# Patient Record
Sex: Female | Born: 1945
Health system: Southern US, Community
[De-identification: ages and names within clinical notes are randomized; demographics above are authoritative.]

## PROBLEM LIST (undated history)

## (undated) DIAGNOSIS — M79606 Pain in leg, unspecified: Secondary | ICD-10-CM

## (undated) DIAGNOSIS — M51369 Other intervertebral disc degeneration, lumbar region without mention of lumbar back pain or lower extremity pain: Secondary | ICD-10-CM

## (undated) DIAGNOSIS — H35039 Hypertensive retinopathy, unspecified eye: Secondary | ICD-10-CM

## (undated) DIAGNOSIS — I2510 Atherosclerotic heart disease of native coronary artery without angina pectoris: Secondary | ICD-10-CM

## (undated) DIAGNOSIS — I6529 Occlusion and stenosis of unspecified carotid artery: Secondary | ICD-10-CM

## (undated) DIAGNOSIS — I509 Heart failure, unspecified: Secondary | ICD-10-CM

## (undated) DIAGNOSIS — E785 Hyperlipidemia, unspecified: Secondary | ICD-10-CM

## (undated) DIAGNOSIS — I739 Peripheral vascular disease, unspecified: Secondary | ICD-10-CM

## (undated) DIAGNOSIS — M5136 Other intervertebral disc degeneration, lumbar region: Secondary | ICD-10-CM

## (undated) DIAGNOSIS — M1990 Unspecified osteoarthritis, unspecified site: Secondary | ICD-10-CM

## (undated) DIAGNOSIS — I639 Cerebral infarction, unspecified: Secondary | ICD-10-CM

## (undated) DIAGNOSIS — M2550 Pain in unspecified joint: Secondary | ICD-10-CM

## (undated) DIAGNOSIS — I219 Acute myocardial infarction, unspecified: Secondary | ICD-10-CM

## (undated) DIAGNOSIS — N189 Chronic kidney disease, unspecified: Secondary | ICD-10-CM

## (undated) DIAGNOSIS — H269 Unspecified cataract: Secondary | ICD-10-CM

## (undated) DIAGNOSIS — E11319 Type 2 diabetes mellitus with unspecified diabetic retinopathy without macular edema: Secondary | ICD-10-CM

## (undated) DIAGNOSIS — I10 Essential (primary) hypertension: Secondary | ICD-10-CM

## (undated) HISTORY — PX: TUBAL LIGATION: SHX77

## (undated) HISTORY — PX: PR VEIN BYPASS GRAFT,AORTO-FEM-POP: 35551

## (undated) HISTORY — DX: Heart failure, unspecified: I50.9

## (undated) HISTORY — DX: Atherosclerotic heart disease of native coronary artery without angina pectoris: I25.10

## (undated) HISTORY — DX: Other intervertebral disc degeneration, lumbar region: M51.36

## (undated) HISTORY — PX: APPENDECTOMY: SHX54

## (undated) HISTORY — DX: Unspecified cataract: H26.9

## (undated) HISTORY — DX: Chronic kidney disease, unspecified: N18.9

## (undated) HISTORY — DX: Occlusion and stenosis of unspecified carotid artery: I65.29

## (undated) HISTORY — PX: CHOLECYSTECTOMY: SHX55

## (undated) HISTORY — DX: Acute myocardial infarction, unspecified: I21.9

## (undated) HISTORY — DX: Essential (primary) hypertension: I10

## (undated) HISTORY — DX: Other intervertebral disc degeneration, lumbar region without mention of lumbar back pain or lower extremity pain: M51.369

## (undated) HISTORY — DX: Peripheral vascular disease, unspecified: I73.9

## (undated) HISTORY — DX: Pain in leg, unspecified: M79.606

## (undated) HISTORY — DX: Hypertensive retinopathy, unspecified eye: H35.039

## (undated) HISTORY — PX: CORONARY ARTERY BYPASS GRAFT: SHX141

## (undated) HISTORY — DX: Cerebral infarction, unspecified: I63.9

## (undated) HISTORY — DX: Pain in unspecified joint: M25.50

## (undated) HISTORY — DX: Type 2 diabetes mellitus with unspecified diabetic retinopathy without macular edema: E11.319

## (undated) HISTORY — DX: Unspecified osteoarthritis, unspecified site: M19.90

## (undated) HISTORY — DX: Hyperlipidemia, unspecified: E78.5

---

## 1996-08-29 DIAGNOSIS — I219 Acute myocardial infarction, unspecified: Secondary | ICD-10-CM

## 1996-08-29 HISTORY — DX: Acute myocardial infarction, unspecified: I21.9

## 1999-01-06 ENCOUNTER — Other Ambulatory Visit: Admission: RE | Admit: 1999-01-06 | Discharge: 1999-01-06 | Payer: Self-pay | Admitting: *Deleted

## 2000-04-14 ENCOUNTER — Encounter: Payer: Self-pay | Admitting: Emergency Medicine

## 2000-04-14 ENCOUNTER — Emergency Department (HOSPITAL_COMMUNITY): Admission: EM | Admit: 2000-04-14 | Discharge: 2000-04-14 | Payer: Self-pay | Admitting: Emergency Medicine

## 2000-05-02 ENCOUNTER — Other Ambulatory Visit: Admission: RE | Admit: 2000-05-02 | Discharge: 2000-05-02 | Payer: Self-pay | Admitting: *Deleted

## 2000-05-19 ENCOUNTER — Encounter (INDEPENDENT_AMBULATORY_CARE_PROVIDER_SITE_OTHER): Payer: Self-pay | Admitting: Specialist

## 2000-05-19 ENCOUNTER — Other Ambulatory Visit: Admission: RE | Admit: 2000-05-19 | Discharge: 2000-05-19 | Payer: Self-pay | Admitting: Obstetrics and Gynecology

## 2001-11-22 ENCOUNTER — Other Ambulatory Visit: Admission: RE | Admit: 2001-11-22 | Discharge: 2001-11-22 | Payer: Self-pay | Admitting: Obstetrics and Gynecology

## 2002-12-27 ENCOUNTER — Encounter: Payer: Self-pay | Admitting: Emergency Medicine

## 2002-12-27 ENCOUNTER — Inpatient Hospital Stay (HOSPITAL_COMMUNITY): Admission: EM | Admit: 2002-12-27 | Discharge: 2002-12-30 | Payer: Self-pay | Admitting: Emergency Medicine

## 2004-05-14 ENCOUNTER — Ambulatory Visit (HOSPITAL_COMMUNITY): Admission: RE | Admit: 2004-05-14 | Discharge: 2004-05-14 | Payer: Self-pay | Admitting: Cardiovascular Disease

## 2006-02-09 ENCOUNTER — Ambulatory Visit (HOSPITAL_COMMUNITY): Admission: RE | Admit: 2006-02-09 | Discharge: 2006-02-10 | Payer: Self-pay | Admitting: Vascular Surgery

## 2006-02-14 ENCOUNTER — Encounter: Admission: RE | Admit: 2006-02-14 | Discharge: 2006-02-14 | Payer: Self-pay | Admitting: Obstetrics and Gynecology

## 2006-02-23 ENCOUNTER — Encounter: Admission: RE | Admit: 2006-02-23 | Discharge: 2006-02-23 | Payer: Self-pay | Admitting: Obstetrics and Gynecology

## 2006-02-28 ENCOUNTER — Encounter: Admission: RE | Admit: 2006-02-28 | Discharge: 2006-02-28 | Payer: Self-pay | Admitting: Obstetrics and Gynecology

## 2006-08-29 HISTORY — PX: BREAST EXCISIONAL BIOPSY: SUR124

## 2006-08-29 HISTORY — PX: COLONOSCOPY: SHX174

## 2007-01-05 ENCOUNTER — Ambulatory Visit: Payer: Self-pay | Admitting: Vascular Surgery

## 2007-04-16 ENCOUNTER — Encounter: Admission: RE | Admit: 2007-04-16 | Discharge: 2007-04-16 | Payer: Self-pay | Admitting: Obstetrics and Gynecology

## 2007-04-24 ENCOUNTER — Encounter: Admission: RE | Admit: 2007-04-24 | Discharge: 2007-04-24 | Payer: Self-pay | Admitting: Obstetrics and Gynecology

## 2007-05-02 ENCOUNTER — Ambulatory Visit: Payer: Self-pay | Admitting: Gastroenterology

## 2007-05-04 ENCOUNTER — Ambulatory Visit: Payer: Self-pay | Admitting: Gastroenterology

## 2007-05-04 ENCOUNTER — Encounter: Payer: Self-pay | Admitting: Gastroenterology

## 2007-07-11 ENCOUNTER — Ambulatory Visit: Payer: Self-pay | Admitting: Vascular Surgery

## 2007-11-22 DIAGNOSIS — E7800 Pure hypercholesterolemia, unspecified: Secondary | ICD-10-CM

## 2007-11-22 DIAGNOSIS — E119 Type 2 diabetes mellitus without complications: Secondary | ICD-10-CM

## 2007-11-22 DIAGNOSIS — I2510 Atherosclerotic heart disease of native coronary artery without angina pectoris: Secondary | ICD-10-CM | POA: Insufficient documentation

## 2007-11-22 DIAGNOSIS — I10 Essential (primary) hypertension: Secondary | ICD-10-CM

## 2007-11-22 DIAGNOSIS — I219 Acute myocardial infarction, unspecified: Secondary | ICD-10-CM | POA: Insufficient documentation

## 2007-11-22 DIAGNOSIS — N200 Calculus of kidney: Secondary | ICD-10-CM | POA: Insufficient documentation

## 2008-01-09 ENCOUNTER — Ambulatory Visit: Payer: Self-pay | Admitting: Vascular Surgery

## 2008-03-25 ENCOUNTER — Other Ambulatory Visit: Admission: RE | Admit: 2008-03-25 | Discharge: 2008-03-25 | Payer: Self-pay | Admitting: Family Medicine

## 2008-04-02 ENCOUNTER — Ambulatory Visit: Payer: Self-pay | Admitting: Vascular Surgery

## 2008-04-04 ENCOUNTER — Ambulatory Visit: Payer: Self-pay | Admitting: Vascular Surgery

## 2008-04-04 ENCOUNTER — Ambulatory Visit (HOSPITAL_COMMUNITY): Admission: RE | Admit: 2008-04-04 | Discharge: 2008-04-04 | Payer: Self-pay | Admitting: Vascular Surgery

## 2008-06-05 LAB — HM DEXA SCAN: HM Dexa Scan: NORMAL

## 2008-07-07 ENCOUNTER — Encounter: Admission: RE | Admit: 2008-07-07 | Discharge: 2008-07-07 | Payer: Self-pay | Admitting: Orthopedic Surgery

## 2008-07-23 ENCOUNTER — Encounter: Admission: RE | Admit: 2008-07-23 | Discharge: 2008-07-23 | Payer: Self-pay | Admitting: Orthopedic Surgery

## 2008-10-22 ENCOUNTER — Ambulatory Visit: Payer: Self-pay | Admitting: Vascular Surgery

## 2009-04-30 ENCOUNTER — Ambulatory Visit: Payer: Self-pay | Admitting: Vascular Surgery

## 2009-09-28 ENCOUNTER — Encounter: Admission: RE | Admit: 2009-09-28 | Discharge: 2009-09-28 | Payer: Self-pay | Admitting: Orthopedic Surgery

## 2009-10-12 ENCOUNTER — Encounter: Admission: RE | Admit: 2009-10-12 | Discharge: 2009-10-12 | Payer: Self-pay | Admitting: Orthopedic Surgery

## 2009-10-15 ENCOUNTER — Encounter: Admission: RE | Admit: 2009-10-15 | Discharge: 2009-10-15 | Payer: Self-pay | Admitting: Orthopedic Surgery

## 2009-10-26 ENCOUNTER — Encounter: Admission: RE | Admit: 2009-10-26 | Discharge: 2009-10-26 | Payer: Self-pay | Admitting: Orthopedic Surgery

## 2009-11-12 ENCOUNTER — Ambulatory Visit: Payer: Self-pay | Admitting: Vascular Surgery

## 2010-05-27 ENCOUNTER — Ambulatory Visit: Payer: Self-pay | Admitting: Vascular Surgery

## 2010-06-07 ENCOUNTER — Emergency Department (HOSPITAL_COMMUNITY): Admission: EM | Admit: 2010-06-07 | Discharge: 2010-06-07 | Payer: Self-pay | Admitting: Emergency Medicine

## 2010-06-09 ENCOUNTER — Encounter: Admission: RE | Admit: 2010-06-09 | Discharge: 2010-06-09 | Payer: Self-pay | Admitting: Family Medicine

## 2010-07-06 ENCOUNTER — Encounter: Admission: RE | Admit: 2010-07-06 | Discharge: 2010-07-06 | Payer: Self-pay | Admitting: Family Medicine

## 2010-07-19 ENCOUNTER — Emergency Department (HOSPITAL_COMMUNITY): Admission: EM | Admit: 2010-07-19 | Discharge: 2010-07-19 | Payer: Self-pay

## 2010-09-19 ENCOUNTER — Encounter: Payer: Self-pay | Admitting: Obstetrics and Gynecology

## 2010-11-09 LAB — DIFFERENTIAL
Basophils Relative: 1 % (ref 0–1)
Eosinophils Absolute: 0 10*3/uL (ref 0.0–0.7)
Lymphs Abs: 1.9 10*3/uL (ref 0.7–4.0)
Monocytes Relative: 7 % (ref 3–12)
Neutro Abs: 4.7 10*3/uL (ref 1.7–7.7)
Neutrophils Relative %: 66 % (ref 43–77)

## 2010-11-09 LAB — COMPREHENSIVE METABOLIC PANEL
BUN: 35 mg/dL — ABNORMAL HIGH (ref 6–23)
CO2: 24 mEq/L (ref 19–32)
Calcium: 9.8 mg/dL (ref 8.4–10.5)
GFR calc non Af Amer: 42 mL/min — ABNORMAL LOW (ref >60)
Glucose, Bld: 297 mg/dL — ABNORMAL HIGH (ref 70–99)
Total Protein: 6.7 g/dL (ref 6.0–8.3)

## 2010-11-09 LAB — CBC
HCT: 38.4 % (ref 36.0–46.0)
MCH: 28 pg (ref 26.0–34.0)
MCHC: 33.8 g/dL (ref 30.0–36.0)
MCV: 82.8 fL (ref 78.0–100.0)
Platelets: 249 10*3/uL (ref 150–400)
RDW: 16.3 % — ABNORMAL HIGH (ref 11.5–15.5)

## 2010-11-09 LAB — URINALYSIS, ROUTINE W REFLEX MICROSCOPIC
Bilirubin Urine: NEGATIVE
Glucose, UA: 100 mg/dL — AB
Hgb urine dipstick: NEGATIVE
Ketones, ur: NEGATIVE mg/dL
Protein, ur: NEGATIVE mg/dL
Urobilinogen, UA: 0.2 mg/dL (ref 0.0–1.0)

## 2010-11-09 LAB — GLUCOSE, CAPILLARY: Glucose-Capillary: 307 mg/dL — ABNORMAL HIGH (ref 70–99)

## 2010-11-10 LAB — URINALYSIS, ROUTINE W REFLEX MICROSCOPIC
Bilirubin Urine: NEGATIVE
Hgb urine dipstick: NEGATIVE
Ketones, ur: NEGATIVE mg/dL
Nitrite: NEGATIVE
Protein, ur: NEGATIVE mg/dL
Specific Gravity, Urine: 1.019 (ref 1.005–1.030)
Urobilinogen, UA: 0.2 mg/dL (ref 0.0–1.0)

## 2010-11-10 LAB — POCT I-STAT, CHEM 8
Creatinine, Ser: 1.3 mg/dL — ABNORMAL HIGH (ref 0.4–1.2)
Glucose, Bld: 189 mg/dL — ABNORMAL HIGH (ref 70–99)
HCT: 42 % (ref 36.0–46.0)
Hemoglobin: 14.3 g/dL (ref 12.0–15.0)
Potassium: 3.8 mEq/L (ref 3.5–5.1)
Sodium: 140 mEq/L (ref 135–145)
TCO2: 25 mmol/L (ref 0–100)

## 2010-11-10 LAB — URINE CULTURE: Culture  Setup Time: 201110101454

## 2010-11-10 LAB — CK TOTAL AND CKMB (NOT AT ARMC)
CK, MB: 1.1 ng/mL (ref 0.3–4.0)
Relative Index: INVALID (ref 0.0–2.5)
Total CK: 72 U/L (ref 7–177)

## 2011-01-11 NOTE — Assessment & Plan Note (Signed)
[facility]                         GASTROENTEROLOGY OFFICE NOTE   Brenda Moreno, Brenda Moreno                        MRN:          [id number]  DATE:05/02/2007                            DOB:          07-Jul-1946    PRIMARY CARE PHYSICIAN:  Dr. Doran Clay.   REASON FOR REFERRAL:  Dr. Henderson Cloud asked me to evaluate Brenda Moreno in  consultation regarding colorectal cancer screening, loose stools.   HISTORY OF PRESENT ILLNESS:  Brenda Moreno is a very pleasant 65 year old  diabetic who has been on insulin for 7-10 years.  She does have loose  stools approximately 3 times a week.  She has never tried Imodium for  that.  She sees no bleeding, she has no abdominal cramping.  She never  has constipation.  She has not been screened for colorectal cancer as  far as she can remember.   REVIEW OF SYSTEMS:  Notable for stable weight, is otherwise essentially  normal and is available on her nursing intake sheet.   PAST MEDICAL HISTORY:  1. Hypertension.  2. Coronary artery disease with a heart attack 13 years ago, status      post coronary artery bypass grafting 10 years ago.  3. Diabetes for 20-25 years.  4. Elevated cholesterol.  5. Kidney stones.  6. Tubal ligation.  7. Breast surgery 20 years ago.  8. Appendectomy when she was 65 years old.  9. Cholecystectomy 5 years ago.   CURRENT MEDICINES:  1. Metformin.  2. Metoprolol.  3. Quinapril.  4. Amlodipine.  5. Actos.  6. Crestor.  7. Prempro.  8. Hydrochlorothiazide.  9. Aspirin 81 mg once daily.  10.Humulin.   ALLERGIES:  No known drug allergies.   SOCIAL HISTORY:  Divorced, 1 son, works in Art gallery manager, smokes  cigarettes, does not drink alcohol.   FAMILY HISTORY:  No colon cancer or colon polyps in family.  Breast  cancer runs in her family, uterine cancer runs in her family.   PHYSICAL EXAMINATION:  Height 5 feet 6 inches, 173 pounds, blood  pressure 112/62, pulse 60.  CONSTITUTIONAL:   Generally well appearing.  NEUROLOGIC:  Alert and oriented x3.  EYES:  Extraocular movements intact.  MOUTH:  Oropharynx moist, no lesions.  NECK:  Supple no lymphadenopathy.  CARDIOVASCULAR:  Heart regular rate and rhythm.  LUNGS:  Clear to auscultation bilaterally.  ABDOMEN:  Soft, nontender, nondistended.  Normal bowel sounds.  EXTREMITIES:  No lower extremity edema.  SKIN:  No rashes or lesions on visible extremities.   ASSESSMENT AND PLAN:  A 65 year old woman with long-standing diabetes,  chronic loose stools, routine risk for colorectal cancer.   First, I will arrange for her to have a colonoscopy performed at her  soonest convenience.  She will take half her insulin the night before  and none the morning of the procedure.  She does have chronic loose  stools, most likely this is related to her diabetes.  Obviously, if any  colitis is seen on the colonoscopy, then I could change my  recommendations, but I will probably be recommending that she try 1-2  Imodium daily to see if that helps her loose stools.  I will wait until  after colonoscopy to make that formal recommendation.     Rachael Fee, MD  Electronically Signed    DPJ/MedQ  DD: 05/02/2007  DT: 05/02/2007  Job #: [id number]   cc:   Carrington Clamp, M.D.

## 2011-01-11 NOTE — Assessment & Plan Note (Signed)
OFFICE VISIT   Brenda Moreno, Brenda Moreno  DOB:  04-14-1946                                       04/02/2008  ZOXWR#:[id number]   The patient is a 65 year old female who I previously performed a right  common iliac artery stent in June 2007.  Her claudication symptoms had  essentially resolved at that time.  Of note, she also had a 50% left  external iliac artery stenosis at that time.  Her atherosclerotic risk  factors continue to include hypertension, diabetes and tobacco abuse.  She is currently smoking 3 cigarettes per day.  She states that she fell  in June and broke her right shoulder and had a left ankle sprain.  She  said shortly after that she started developing numbness in her right  calf with ambulation.  She is currently undergoing physical therapy for  her injuries from her fall but states that she has been limited by her  walking distance.   MEDICATIONS:  Include NitroQuick 0.4 mg once a day, metformin 1000 mg 2  daily, metoprolol 100 mg once a day, Actos 45 mg once a day,  hydrochlorothiazide 25 mg once a day, Amlodipine 5 mg once a day,  quinapril 40 mg once a day, Crestor 10 mg once a day, Prempro 0.3 mg  once a day, Lantus insulin 20 units once a day, aspirin 81 mg once a  day, furosemide 20 mg p.r.n.   PHYSICAL EXAM:  Blood pressure is 109/72 in the left arm.  Pulse is 58  and regular.  HEENT is unremarkable.  Neck has 2+ carotid pulses without  bruit.  Chest:  Clear to auscultation.  Cardiac:  Exam is regular rate  and without murmur.  Abdomen is soft, nontender without mass.  Extremities:  She has mild left ankle edema over the lateral malleolus.  She has no palpable femoral, popliteal or pedal pulses bilaterally.   ABIs in May 2009 were 0.75 on the left and 0.91 on the right.   I believe the patient has probably developed recurrent stenosis of her  right iliac stent and has evidence of bilateral aortoiliac occlusive  disease.  I believe the  best option for her is arteriogram to determine  if she may be amendable to re-angioplasty or stenting.  Since she does  not have a femoral pulse, I did discuss with her that she may require a  left brachial puncture.  She does have 2+ brachial and radial pulses  bilaterally.  Slightly increased risk of bleeding was explained her with  a brachial puncture.  Otherwise, risks, benefits, possible  complications, and procedure details of angioplasty, stenting, and  arteriogram were explained to her.  She understands and agrees to  proceed.  Procedure is scheduled for April 04, 2008.   Janetta Hora. Fields, MD  Electronically Signed   CEF/MEDQ  D:  04/03/2008  T:  04/03/2008  Job:  [id number]   cc:   Al Decant. Janey Greaser, MD

## 2011-01-11 NOTE — Assessment & Plan Note (Signed)
OFFICE VISIT   SONA, NATIONS  DOB:  1946/08/04                                       10/22/2008  ZOXWR#:[id number]   The patient returns for followup today.  She underwent right common  iliac artery stenting in June of 2007.  She recently underwent an  arteriogram in August of 2009 which showed that her stent was widely  patent and she had diffuse superficial femoral artery occlusive disease  with 25% stenosis bilaterally.  She also had a 90% stenosis at the  origin of the posterior tibial and peroneal artery in the right leg.  Otherwise she had bilateral three vessel runoff.   She states that her claudication symptoms are overall stable and  essentially nonexistent currently.  She has had some issues with chronic  back pain and had some burning and numbness in her right leg.  She  apparently had steroid injection for this and her numbness symptoms in  the leg have completely resolved.   Unfortunately she has continued to smoke.  She is currently smoking  three cigarettes per day.  I spent a lengthy amount of time again  counseling her against smoking cessation.  She was also given a pamphlet  for the smoking cessation program at [facility].   Otherwise her atherosclerotic risk factors include hypertension and  diabetes.   She states that she started smoking because she was gaining too much  weight.   MEDICATIONS:  1. Amlodipine 5 mg once a day.  2. Crestor 10 mg once a day.  3. Quinapril 40 mg twice a day.  4. Hydrochlorothiazide 25 mg once a day.  5. Actos 45 mg once a day.  6. Metoprolol 100 mg once a day.  7. Metformin 1000 mg two daily.  8. Lantus insulin 25 units once a day.  9. Aspirin 81 mg once a day.   ALLERGIES:  She has no known drug allergies.   PHYSICAL EXAMINATION:  On physical exam blood pressure is 153/79 in the  left arm, pulse is 68 and regular.  HEENT:  Unremarkable.  Neck:  Has 2+  carotid pulses without bruit.   Chest:  Clear to auscultation.  Cardiac:  Regular rate and rhythm.  Abdomen:  Soft, nontender, nondistended with  no masses.  Extremities:  She has 2+ femoral pulses bilaterally and 2+  dorsalis pedis pulses bilaterally.   ABIs today were 0.92 on the right and 0.82 on the left, waveforms were  biphasic.   Overall the patient has stable arterial occlusive disease.  She does not  have debilitating claudication or rest pain at this time.  I believe we  should continue to manage this with risk factor modification and I again  emphasized smoking cessation and control of her diabetes.  She will  follow up with repeat ABIs in six months' time or sooner if she develops  worsening symptoms.   Janetta Hora. Fields, MD  Electronically Signed   CEF/MEDQ  D:  10/23/2008  T:  10/23/2008  Job:  [id number]   cc:   Al Decant. Janey Greaser, MD

## 2011-01-11 NOTE — Op Note (Signed)
Brenda Moreno, Brenda Moreno                 ACCOUNT NO.:  [id number]   MEDICAL RECORD NO.:  [id number]          PATIENT TYPE:  AMB   LOCATION:  SDS                          FACILITY:  [facility]   PHYSICIAN:  Janetta Hora. Fields, MD  DATE OF BIRTH:  1946-08-15   DATE OF PROCEDURE:  04/04/2008  DATE OF DISCHARGE:  04/04/2008                               OPERATIVE REPORT   PROCEDURE:  Aortogram with bilateral lower extremity runoff.   PREOPERATIVE DIAGNOSIS:  Bilateral lower extremity claudication.   POSTOPERATIVE DIAGNOSIS:  Bilateral lower extremity claudication.   ANESTHESIA:  Local.   OPERATIVE DETAILS:  After obtaining informed consent, the patient taken  to the [facility] Lab.  The patient placed in supine position on the angio  table.  Both groins were prepped and draped in usual sterile fashion.  Local anesthesia was infiltrated over the right common femoral artery.  Majestic needle was used to cannulate the right common femoral artery  and a 0.035 Wholey wire advanced into the abdominal aorta under  fluoroscopic guidance.  Next, a 5-French sheath placed over the  guidewire in the right common femoral artery.  A 5-French pigtail  catheter was then placed over the guidewire in the abdominal aorta.  Abdominal aortogram was obtained.  This shows bilateral single patent  renal arteries.  There is diffuse atherosclerotic change and some  calcification of the infrarenal abdominal aorta.  There is approximately  30% narrowing in a tapered fashion of the distal abdominal aorta.  Right  and left common iliac, external iliac and internal iliac arteries are  widely patent.  Previously placed right common iliac artery stent is  widely patent.   Next, bilateral lower extremity runoff was performed.  Left and right  common femoral, profunda femoris, and superficial femoral arteries are  patent.  There are several areas of calcification and mild narrowing of  the left and right superficial femoral arteries.   On the right side,  this is in the mid section.  The left side is in the mid to distal SFA.  Bilateral popliteal arteries are patent.  The anterior tibial artery is  patent bilaterally, all the way to the foot is the dorsalis pedis  artery.  Tibioperoneal trunk on the right side is patent.  However,  there is diffuse narrowing and high-grade stenosis greater than 90% of  the origin of the posterior tibial and peroneal artery on the right leg.  The peroneal and posterior tibial arteries are smaller in caliber than  the anterior tib in the left leg but these are patent.  There is three-  vessel runoff to the foot bilaterally.  This is primarily dominant via  the dorsalis pedis artery.  The posterior tibial artery is small in  caliber on both sides.  The peroneal is more diseased on both sides but  patent.  Next, the pigtail catheter was pulled back over a guidewire.  The 5-French sheath was left in place to be pulled in the holding area.  The patient tolerated the procedure well and there were no  complications.  The patient was taken  to the holding area in stable  condition.   OPERATIVE FINDINGS:  1. No renarrowing of right common iliac artery stent.  2. Mild diffuse approximately 25% stenosis of the left and right      superficial femoral arteries.  3. 90% stenosis of origin of posterior tibial and peroneal artery      right leg.  4. Bilateral three-vessel runoff.      Janetta Hora. Fields, MD  Electronically Signed     CEF/MEDQ  D:  04/04/2008  T:  04/05/2008  Job:  [id number]

## 2011-01-14 NOTE — Cardiovascular Report (Signed)
NAME:  Brenda Moreno, Brenda Moreno NO.:  [id number]   MEDICAL RECORD NO.:  [id number]                   PATIENT TYPE:  INP   LOCATION:  3734                                 FACILITY:  [facility]   PHYSICIAN:  Vesta Mixer, M.D.              DATE OF BIRTH:  [birth year]   DATE OF PROCEDURE:  12/30/2002  DATE OF DISCHARGE:                              CARDIAC CATHETERIZATION   INDICATIONS FOR PROCEDURE:  The patient is a 65 year old female with a  history of coronary artery disease, status post coronary artery bypass  grafting. She was admitted to the hospital on Friday, December 27, 2002, for  episodes of chest pain. Please see the dictated history and physical for  further details. The patient was referred for heart catheterization based on  her symptoms.   PROCEDURE:  Left heart catheterization with coronary angiography. The right  femoral artery was easily cannulated using modified Seldinger technique.   HEMODYNAMICS:  Left ventricular  pressure was 154/29 with an aortic pressure  of 154/65.   ANGIOGRAPHY:  The left main coronary artery is relatively smooth and normal.  The left anterior descending artery has an 80% stenosis proximally. The 1st  diagonal vessel was occluded. There is competitive flow down the distal LAD.   The left circumflex artery is a fairly normal vessel in the proximal  segment. It gives off a large 1st obtuse marginal artery which is  essentially normal. The 2nd obtuse marginal artery is subtotally occluded  with very sluggish antegrade flow.   The right coronary artery is a large vessel. There are mild to moderate  irregularities in the proximal segment. There is a 60% to 70% stenosis in  the distal aspect of the vessel. There is competitive flow from the  saphenous vein graft.   The saphenous vein graft to the 2nd diagonal vessel is a moderate sized  graft. The anastomosis is normal. The native diagonal vessel was fairly  small but is  otherwise normal.   The saphenous vein graft to the 1st diagonal vessel was a relatively normal  graft. It is moderate in size and the anastomosis is normal. The  1st  diagonal vessel itself is fairly small in size.   The saphenous vein graft to the distal right coronary artery and  posterolateral segment artery is a very large graft. The anastomosis looked  normal. There is competitive flow via the native right coronary artery. This  graft is normal. The saphenous vein graft to the 2nd obtuse marginal artery  is occluded proximally. There was no antegrade flow at all. In viewing the  native injections of the 2nd obtuse marginal artery, there is no evidence of  competitive flow either.   The left internal mammary artery is a small to moderate sized vessel. The  flow is somewhat slower than what would be expected, although the artery  itself is normal. The anastomosis  to the LAD is normal. There was  competitive flow visible from the mid vessel.   The left ventriculogram was performed in the 30 RAO position. I reveals  overall normal left ventricular systolic function. The ejection fraction is  between 65% and 70%. There was no mitral regurgitation.   COMPLICATIONS:  None.   CONCLUSIONS:  1. Moderate to severe native coronary artery disease with an occluded     saphenous vein graft to the 2nd obtuse marginal artery. This may explain     some of her symptoms. She has normal left ventricular systolic function     and her other grafts are functioning normally. We will continue with     medical therapy.                                               Vesta Mixer, M.D.    PJN/MEDQ  D:  12/30/2002  T:  12/30/2002  Job:  [id number]

## 2011-01-14 NOTE — Discharge Summary (Signed)
   Brenda Moreno, Brenda Moreno                           ACCOUNT NO.:  [id number]   MEDICAL RECORD NO.:  [id number]                   PATIENT TYPE:  INP   LOCATION:  3734                                 FACILITY:  [facility]   PHYSICIAN:  Vesta Mixer, M.D.              DATE OF BIRTH:  10-02-1945   DATE OF ADMISSION:  12/27/2002  DATE OF DISCHARGE:  12/30/2002                                 DISCHARGE SUMMARY   DISCHARGE DIAGNOSES:  1. Noncardiac chest pain.  2. History of coronary artery disease.  3. Diabetes mellitus.  4. Hyperlipidemia.   DISCHARGE MEDICATIONS:  1. Enteric-coated aspirin 81 mg a day.  2. Nitroglycerin 0.4 mg sublingually as needed.  3. Glucotrol XL 10 mg a day.  4. Cardura 4 mg a day.  5. Toprol XL 50 mg a day.   DISPOSITION:  The patient is to eat a low-fat, low-salt, and low-cholesterol  diet.  She is to see Dr. Elease Hashimoto in 1-2 weeks.   HISTORY:  The patient is a 65 year old female with a long history of  coronary artery disease.  She is status post coronary artery bypass grafting  five years ago.  She was admitted to the hospital with some episodes of  chest pain and chest discomfort.  Please see dictated H&P for further  details.   HOSPITAL COURSE:  Coronary artery disease.  The patient ruled out for a  myocardial infarction with serial CPK's.  She had a heart catheterization on  May 3 which revealed an old occlusion of the saphenous vein graft to the  obtuse marginal artery.  The other grafts in the native vessels appeared to  be fairly well preserved.  She has brisk flow down each of the native  targets.  She had well-preserved left ventricular systolic function.  It is  quite likely that the chest pain that she had earlier this week had nothing  to do with the occlusion of the obtuse marginal graft.  She will be  discharged on the above-noted medications and disposition.  She is at low  risk for having any acute myocardial infarction since the graft is  already  occluded.  I have encouraged her to stop smoking.  I will see her in several  weeks.                                               Vesta Mixer, M.D.   PJN/MEDQ  D:  12/30/2002  T:  12/31/2002  Job:  [id number]   cc:   Al Decant. Janey Greaser, M.D.  [address]  [place]  [place] [address]  Fax: [phone]

## 2011-01-14 NOTE — Cardiovascular Report (Signed)
NAME:  Brenda Moreno, Brenda Moreno NO.:  [id number]   MEDICAL RECORD NO.:  [id number]                   PATIENT TYPE:  OIB   LOCATION:  2899                                 FACILITY:  [facility]   PHYSICIAN:  Vesta Mixer, M.D.              DATE OF BIRTH:  [birth year]   DATE OF PROCEDURE:  05/14/2004  DATE OF DISCHARGE:                              CARDIAC CATHETERIZATION   [name] is a middle-aged female with history of coronary artery disease.  She is status post coronary artery bypass grafting.  She has history of  continued cigarette smoking.  She was seen on Wednesday for episodes of  chest pain.  It was initially recommended that she have a heart  catheterization, but she declined.  We had scheduled a stress Cardiolite  study instead.  When she showed up this morning for her Cardiolite study,  she was severely short of breath and was having some chest pain and we  decided to proceed with heart catheterization.   PROCEDURE PERFORMED:  Left heart catheterization with coronary angiography.   The right femoral artery was easily cannulated using a modified Seldinger  technique.   HEMODYNAMICS:  The left ventricular pressure was 115/7 with an aortic  pressure of 112/50.   CORONARY ANGIOGRAPHY:  1.  Left main coronary artery has minor luminal irregularities.  2.  The left anterior descending artery has moderate to severe stenosis in      the proximal segment.  It is subtotally occluded after giving off the      first diagonal branch.  3.  The left circumflex artery has moderate disease proximally.  The obtuse      marginal artery has mild irregularities.  The circumflex artery is      occluded before giving off the second marginal branch.  4.  The right coronary artery has moderate disease in the mid segment.  The      distal right coronary artery has several severe stenoses between the      posterior lateral branches.  There is competitive flow down the  posterior descending artery and down the third posterior lateral branch.      The saphenous vein graft to the right coronary artery has minor luminal      irregularities in the proximal segment.  Other than that it is a normal      graft with normal insertion to the PDA and the posterior lateral segment      branch.  5.  The saphenous vein graft to the first diagonal is normal.  The diagonal      vessel is fairly small, but is widely patent.  6.  The saphenous vein graft to the second diagonal is normal.  7.  The saphenous vein graft to the obtuse marginal artery #2 is occluded.      This is unchanged from her previous catheterization.  8.  The left internal mammary artery is normal.  The insertion site into the      left anterior descending artery is normal.   LEFT VENTRICULOGRAM:  Left ventriculogram was performed in a 30-RAO  position.  It reveals normal left ventricular systolic function.   CONCLUSIONS:  1.  Severe native coronary artery disease.  She has patent grafts to the      right coronary artery, her diagonal vessels and the left anterior      descending artery.  2.  The saphenous vein graft to the obtuse marginal branch is occluded and      it has been such for the past year and a half.   We will continue with medical therapy.  There were no obvious complications.                                               Vesta Mixer, M.D.    PJN/MEDQ  D:  05/14/2004  T:  05/14/2004  Job:  [id number]   cc:   Dellis Anes. Idell Pickles, M.D.  [address]  [place]  [place] [address]  Fax: [phone]

## 2011-01-14 NOTE — H&P (Signed)
NAME:  Brenda Moreno, Brenda Moreno NO.:  [id number]   MEDICAL RECORD NO.:  [id number]                   PATIENT TYPE:  INP   LOCATION:  3734                                 FACILITY:  [facility]   PHYSICIAN:  Vesta Mixer, M.D.              DATE OF BIRTH:  1945-09-19   DATE OF ADMISSION:  12/27/2002  DATE OF DISCHARGE:                                HISTORY & PHYSICAL   HISTORY OF PRESENT ILLNESS:  Ms. Orest Dikes is a middle-age female with a history  of coronary artery disease, status post coronary artery bypass grafting.  She also has a history of diabetes mellitus and hypercholesterolemia.  She  is admitted with progressive angina.   The patient has a long history of coronary artery disease.  She is status  post PTCA and stenting in 1996 and then status post coronary artery bypass  grafting in 1998.  She has had a history of hypertension and diabetes  mellitus for quite some time.  She has done quite well for the past five  years.  In fact, she has not been in to see me for the past five years.  Approximately one month ago, she started having some mild episodes of chest  pain with exertion.  Over the past several weeks, she has been having more  and more episodes of chest pain with less and less exertion.  Over the past  several days, she has had so much chest pain that she has not been able to  walk around and do her normal activities.  She presented to the emergency  room for further evaluation.   CURRENT MEDICATIONS:  1. Novolin N 10 U q.h.s.  2. Metformin 1000 mg p.o. b.i.d.  3. Toprol XL 150 mg/day.  4. Glucotrol XL 10 mg/day.  5. Lotensin 40 mg/day.  6. Avandia 4 mg p.o. b.i.d.  7. Hydrochlorothiazide 25 mg/day  8. Atacand 32 mg/day.  9. Lipitor 10 mg/day.  10.      Welchol 625 mg 3 times a day.  11.      Norvasc 5 mg/day.  12.      Cardura 4 mg/day.  13.      FemHRT 1 tablet/day.  14.      Aspirin 81 mg/day.   ALLERGIES:  She has no known drug  allergies.   PAST MEDICAL HISTORY:  1. Hypertension.  2. Hyperlipidemia.  3. Coronary artery disease.   SOCIAL HISTORY:  The patient continues to smoke a half pack of cigarettes a  day.  She does not drink to excess.   FAMILY HISTORY:  Positive for coronary artery disease.   REVIEW OF SYSTEMS:  Reviewed and is essentially negative.   PHYSICAL EXAMINATION:  GENERAL:  She is a middle-age black female in no  acute distress.  She is alert and oriented x3, and her mood and affect are  normal.  VITAL SIGNS:  Blood pressure 114/70 with a heart rate of 70.  HEENT:  Reveals 2+ carotids.  She has no bruits.  There is no JVD.  No  thyromegaly.  LUNGS:  Clear to auscultation.  HEART:  Regular rate, S1, S2.  She has no murmurs, gallops or rubs.  ABDOMEN:  Elicited bowel sounds and is nontender.  EXTREMITIES:  She has no clubbing, cyanosis or edema.  NEUROLOGIC:  Nonfocal.   LABORATORY STUDIES:  Her EKG reveals normal sinus rhythm.  She has no ST- or  T-wave changes.  Laboratory data is negative.  The cardiac enzymes are  negative.   IMPRESSION/PLAN:  1. [name] presents with some episodes of angina.  She has a nonacute EKG.  2. We will admit her for stabilization.  3. We will place her on IV heparin.  4. We will anticipate performing heart catheterization on Monday if all of     her other medical problems are stable.  5. We will consult the [facility] hospitalist if she has further noncardiac     issues.                                               Vesta Mixer, M.D.    PJN/MEDQ  D:  12/27/2002  T:  12/27/2002  Job:  [id number]   cc:   Al Decant. Janey Greaser, M.D.  [address]  [place]  [place] [address]  Fax: [phone]

## 2011-01-14 NOTE — Op Note (Signed)
Brenda Moreno, Brenda Moreno                 ACCOUNT NO.:  [id number]   MEDICAL RECORD NO.:  [id number]          PATIENT TYPE:  OIB   LOCATION:  5524                         FACILITY:  [facility]   PHYSICIAN:  Janetta Hora. Fields, MD  DATE OF BIRTH:  1946-06-17   DATE OF PROCEDURE:  02/09/2006  DATE OF DISCHARGE:                                 OPERATIVE REPORT   PROCEDURE:  1.  Aortogram with bilateral lower extremity runoff.  2.  Primary stenting of right common iliac artery stenosis.   PREOPERATIVE DIAGNOSIS:  Claudication right lower extremity.   POSTOPERATIVE DIAGNOSIS:  Claudication right lower extremity.   ANESTHESIA:  Local.   ASSISTANT:  Nurse.   OPERATIVE FINDINGS:  1.  Greater than 75% stenosis right common iliac artery.  2.  Moderate stenosis left external iliac artery origin.  3.  A 3-vessel runoff bilaterally.  4.  An 8 x 24 Genesis balloon expandable stent right common iliac artery.   OPERATIVE DETAILS:  After obtaining informed consent, the patient taken to  the PV lab.  The patient placed in the supine position on the angio table.  Both groins were prepped and draped in the usual sterile fashion.  Local  anesthesia was infiltrated over the left common femoral artery.  A Majestic  needle was used to cannulate the left common femoral artery and a 0.035 J-  tip guide wire threaded into the abdominal aorta under fluoroscopic  guidance.   Next a 5-French sheath was placed over the guidewire in the left common  femoral artery.  A 5-French pigtail catheter was then placed into the  abdominal aorta and abdominal aortogram was obtained.  This shows mild  atherosclerotic changes of the abdominal aorta.  There are single renal  arteries bilaterally; and these are widely patent.  There is some suggestion  of a right common iliac artery stenosis on the initial view.  Pelvic  arteriogram was also performed which shows that the left external iliac  origin has a moderate 50% stenosis.   Left and right internal iliac arteries  are patent.  External iliac arteries are patent distally.  Magnified views  were then performed of the aortic bifurcation.  There is a high-grade  stenosis of the right common iliac artery greater than 75%.  This was best  used on an oblique view.  This also, again, confirms approximately 50%  stenosis of the origin of the left external iliac artery, right at the iliac  bifurcation.   Next, lower extremity runoff views were obtained.  This shows that the left-  and-right common femoral arteries are patent.  Profunda femoris and  superficial femoral arteries are patent at their origin.  The superficial  femoral arteries have mild atherosclerotic changes throughout their course.  The right superficial femoral artery has a 50% stenosis near the adductor  hiatus.  Popliteal arteries are patent bilaterally.  There is 3-vessel  runoff to the foot bilaterally.   Next, the right groin was anesthetized over the right common femoral artery,  and a Majestic needle was used to cannulate the right common femoral  artery.  A 0.035 Wholey wire was then advanced into the abdominal aorta and past the  stenosis in the right common iliac.  A 6-French long bright tip sheath was  then brought up in the operative field, and advanced up to the level of the  iliac bifurcation.  A 5-mm angioplasty balloon was then placed over the  guidewire up to the level of the lesion.  This was then inflated to 10  atmospheres for 1 minute.  There was some minimal waist on the balloon  during this initial angioplasty.  There was still a greater than 75%  stenosis of the common iliac artery origin.   Next the angioplasty balloon was removed over the guidewire.  The patient  was given 5000 units of intravenous heparin prior to inflating the  angioplasty balloon.  After this had been removed.  The dilator was then  placed back in the 6-French sheath; and this was easily advanced up past  the  iliac bifurcation; and up into the common iliac artery, and past the common  iliac artery bifurcation.  Dilator was then, again, removed.  An 8 x 24  Genesis balloon expandable stent was then brought up onto the operative  field.  This was placed in position just above the aortic bifurcation.  This  was then deployed at 8 atmospheres for 1 minute.  The patient had some mild  discomfort in the pelvis during this portion of procedure; and this quickly  resolved with deflation of the limb.  A completion arteriogram was then  obtained and it shows the stent in good position with a widely patent right  common iliac artery; and no residual stenosis.  There was no encroachment  into the left common iliac artery.   The guidewires were then removed and the sheath left in place to be removed  when the ACT was less than 175.  The patient tolerated the procedure well  and there were no complications.  The patient was taken to recovery room in  stable condition.      Janetta Hora. Fields, MD  Electronically Signed     CEF/MEDQ  D:  02/09/2006  T:  02/09/2006  Job:  [id number]

## 2011-05-04 ENCOUNTER — Other Ambulatory Visit: Payer: Self-pay | Admitting: Family Medicine

## 2011-05-04 DIAGNOSIS — Z1231 Encounter for screening mammogram for malignant neoplasm of breast: Secondary | ICD-10-CM

## 2011-05-25 ENCOUNTER — Encounter: Payer: Self-pay | Admitting: Physician Assistant

## 2011-05-27 ENCOUNTER — Ambulatory Visit (INDEPENDENT_AMBULATORY_CARE_PROVIDER_SITE_OTHER): Payer: Medicare Other | Admitting: Thoracic Diseases

## 2011-05-27 ENCOUNTER — Encounter (INDEPENDENT_AMBULATORY_CARE_PROVIDER_SITE_OTHER): Payer: Medicare Other | Admitting: *Deleted

## 2011-05-27 ENCOUNTER — Encounter: Payer: Self-pay | Admitting: Thoracic Diseases

## 2011-05-27 VITALS — BP 119/57 | HR 58 | Resp 16 | Ht 65.0 in | Wt 197.0 lb

## 2011-05-27 DIAGNOSIS — I209 Angina pectoris, unspecified: Secondary | ICD-10-CM

## 2011-05-27 DIAGNOSIS — Z48812 Encounter for surgical aftercare following surgery on the circulatory system: Secondary | ICD-10-CM

## 2011-05-27 DIAGNOSIS — I739 Peripheral vascular disease, unspecified: Secondary | ICD-10-CM

## 2011-05-27 NOTE — Progress Notes (Signed)
[facility] HISTORY AND PHYSICAL -PAD XB:[id number]  pain in lateral calf ; S/P right CIA stent 01/2006 [facility]  History of Present Illness  Brenda Moreno is a 65 y.o. female patient who presents with chief complaint of right lower extremity PAD. Pt. reports 1 month history of achiness in right lateral/post calf especially with walking  Or standing on her feet too long.she states it feels like a muscle spasm. This is relieved with rest and elevation of her legs. Pt. denies rest pain;denies night pain; denies non healing ulcers on any extremity.  Pt has had previous intervention of Right Interventional Vascular Procedure of right CIA stent placed by Dr. Darrick Penna  01/2006  Past Medical History  Diagnosis Date  . Hyperlipidemia   . Hypertension   . CHF (congestive heart failure)   . Arthritis   . Leg pain   . Joint pain   . Myocardial infarction   . Carotid artery occlusion   . Diabetes mellitus   . CAD (coronary artery disease)     Social History History  Substance Use Topics  . Smoking status: Current Everyday Smoker -- 0.2 packs/day    Types: Cigarettes  . Smokeless tobacco: Not on file  . Alcohol Use: No    Family History Family History  Problem Relation Age of Onset  . Heart disease Mother   . Heart disease Father   . Heart disease Sister   . Coronary artery disease Other     ROS: [x]  Positive   [ ]  Negative   [ ]  All sytems reviewed and are negative  General:[ ]  Weight loss,  [ ]  Weight gain, [ ]  Fever, [ ]  chills Neurologic: [ ]  Dizziness, [ ]  Blackouts, [ ]  Seizure [ ]  Stroke, [ ]  "Mini stroke", [ ]  Slurred speech, [ ]  Temporary blindness;  [ ] weakness, [ ]  Hoarseness Cardiac: [x ] Chest pain/pressure intermittently, [ ]  Shortness of breath at rest [x ] Shortness of breath with exertion,  [ ]   Atrial fibrillation or irregular heartbeat Vascular:[x ] Pain in legs with walking, [ ]  Pain in legs at rest ,[ ]  Pain in legs at night,  [ ]    Non-healing ulcer, [ ]  Blood clot in vein/DVT,   Pulmonary: [ ]  Home oxygen, [ ]   Productive cough, [ ]  Coughing up blood,  [ ]  Asthma,  [ ]  Wheezing Musculoskeletal:  [x ] Arthritis, [x ] Low back pain,  [x ] Joint pain Hematologic:[ ]  Easy Bruising, [ ]  Anemia; [ ]  Hepatitis Gastrointestinal: [ ]  Blood in stool,  [ ]  Gastroesophageal Reflux, [ ]  Trouble swallowing Urinary: [ ]  chronic Kidney disease, [ ]  on HD, [ ]  Burning with urination, [ ]  Frequent urination, [ ]  Difficulty urinating;  Skin: [ ]  Rashes, [ ]  Wounds    No Known Allergies  Current outpatient prescriptions:amLODipine (NORVASC) 5 MG tablet, Take 5 mg by mouth daily.  , Disp: , Rfl: ;  aspirin EC 81 MG tablet, Take 81 mg by mouth daily.  , Disp: , Rfl: ;  hydrochlorothiazide (HYDRODIURIL) 25 MG tablet, Take 25 mg by mouth daily.  , Disp: , Rfl: ;  insulin glargine (LANTUS) 100 UNIT/ML injection, Inject 20 Units into the skin at bedtime.  , Disp: , Rfl:  metFORMIN (GLUMETZA) 1000 MG (MOD) 24 hr tablet, Take 1,000 mg by mouth 2 (two) times daily with a meal.  , Disp: , Rfl: ;  metoprolol (TOPROL-XL) 100 MG 24 hr tablet, Take  100 mg by mouth daily.  , Disp: , Rfl: ;  quinapril (ACCUPRIL) 40 MG tablet, Take 40 mg by mouth 2 (two) times daily.  , Disp: , Rfl: ;  rosuvastatin (CRESTOR) 10 MG tablet, Take 10 mg by mouth daily.  , Disp: , Rfl:   Physical Examination  Filed Vitals:   05/27/11 1702  BP: 119/57  Pulse: 58  Resp: 16    There is no height or weight on file to calculate BMI.  General: A&O x 3, WDWN Gait: normal Eyes: PERRLA, Pulmonary: CTAB, Negative  Rales, Negative rhonchi, & Negative wheezing,  Cardiac: regular Rythm ,  Negative Murmurs,  Negative  rubs or gallops                                                                 RIGHT                                       LEFT  CAROTID BRUIT Negative Negative   Gastrointestinal:soft, nontender, BS WNL, no r/g,  Neurologic: A&O X 3; Appropriate Affect ;  SENSATION ;normal; MOTOR FUNCTION: normal 5/5 strength in all tested muscle groups Musculoskeletal:Strength 5/5 all extremities Skin  Normal without ulcers  VASCULAR EXAM: Extremities without ischemic changes  without  Gangrenewithout cellulitis  without open wounds;   LOWER EXTREMITY PULSES           RIGHT                                      LEFT      FEMORAL palpable palpable       POPLITEAL palpable palpable       POSTERIOR TIBIAL palpable palpable       DORSALIS PEDIS      ANTERIOR TIBIAL present 2+ and palpable present 2+ and palpable    Non-Invasive Vascular Imaging: DATE: 05/27/2011 ABI: RIGHT 0.79 ( 0.94 in 04/2010);  LEFT 0.75 (0.89 04/2010)  Previous angiogram: Yes with findings of R CIA stenosis   ASSESSMENT: Brenda Moreno is a 65 y.o. female who presents with: right lower PAD with 1 month Hx of intermittent claudication type symptoms with reduced flow to BLE as indicated by decreasing ABI.  She also has had some cardiac symptoms with few episodes of chest tightness, relieved by rest. Denies any symptoms in past few days  PLAN:  Based on the patient's vascular studies and examination A/possible angiogram to assess right CIA stent and more peripheral stenoses was reccommended. Will discuss with Dr Humphrey Rolls  Pt is also going to call or stop by her cardiologist office today for any w/u that may be needed in regards to her cardiac issues.   appt 3 months with new ABI's if Angio not indicated  Clinic MD: Loraine Maple, MD

## 2011-06-14 ENCOUNTER — Ambulatory Visit
Admission: RE | Admit: 2011-06-14 | Discharge: 2011-06-14 | Disposition: A | Discharge status: Home / Self Care | Payer: Medicare Other | Source: Ambulatory Visit | Attending: Family Medicine | Admitting: Family Medicine

## 2011-06-14 DIAGNOSIS — Z1231 Encounter for screening mammogram for malignant neoplasm of breast: Secondary | ICD-10-CM

## 2011-06-30 ENCOUNTER — Ambulatory Visit (INDEPENDENT_AMBULATORY_CARE_PROVIDER_SITE_OTHER): Payer: Medicare Other | Admitting: Cardiovascular Disease

## 2011-06-30 ENCOUNTER — Encounter: Payer: Self-pay | Admitting: Cardiovascular Disease

## 2011-06-30 VITALS — BP 139/73 | HR 60 | Ht 65.0 in | Wt 200.4 lb

## 2011-06-30 DIAGNOSIS — I2510 Atherosclerotic heart disease of native coronary artery without angina pectoris: Secondary | ICD-10-CM

## 2011-06-30 DIAGNOSIS — R079 Chest pain, unspecified: Secondary | ICD-10-CM

## 2011-06-30 NOTE — Assessment & Plan Note (Signed)
She has a hx of CAD - s/p CABG.  She has had some recent chest pains.  No exertional CP.  She has not had any discomfort in the past several weeks.    She had joined the gym and is planning on restarting her exercise program.  Will schedule her for a 2 day stress myoview.   Return visit in 2-3 months.

## 2011-06-30 NOTE — Progress Notes (Signed)
Brenda Moreno Date of Birth  [birth year] [facility] [address]    [address] [place], [place]  [address] [phone]  Fax  [phone]  History of Present Illness:  This is hASS a 65 year old female with a history of coronary artery disease but she status post coronary artery bypass grafting in 1998. I last saw her approximately 10 years ago. She presents today for further evaluation of mild chest pain.  The chest pains occurred with rest and lasted 5 minutes.  She did not take NTG.  She has not been execising.    Current Outpatient Prescriptions on File Prior to Visit  Medication Sig Dispense Refill  . amLODipine (NORVASC) 5 MG tablet Take 5 mg by mouth daily.        Marland Kitchen aspirin EC 81 MG tablet Take 81 mg by mouth daily.        . hydrochlorothiazide (HYDRODIURIL) 25 MG tablet Take 25 mg by mouth daily.        . insulin glargine (LANTUS) 100 UNIT/ML injection Inject 25 Units into the skin at bedtime.       . metFORMIN (GLUMETZA) 1000 MG (MOD) 24 hr tablet Take 1,000 mg by mouth daily with breakfast.       . metoprolol (TOPROL-XL) 100 MG 24 hr tablet Take 100 mg by mouth daily.        . quinapril (ACCUPRIL) 40 MG tablet Take 40 mg by mouth daily.         No Known Allergies  Past Medical History  Diagnosis Date  . Hyperlipidemia   . Hypertension   . CHF (congestive heart failure)   . Arthritis   . Leg pain   . Joint pain   . Myocardial infarction   . Carotid artery occlusion   . Diabetes mellitus   . CAD (coronary artery disease)     S/P cabg    Past Surgical History  Procedure Date  . Coronary artery bypass graft     1998  . Appendectomy   . Tubal ligation   . Cholecystectomy     Gall bladder  . Pr vein bypass graft,aorto-fem-pop     History  Smoking status  . Current Everyday Smoker -- 0.2 packs/day  . Types: Cigarettes  Smokeless tobacco  . Not on file    History  Alcohol Use No    Family History  Problem Relation Age of Onset  . Heart  disease Mother   . Heart disease Father   . Heart disease Sister   . Coronary artery disease Other     Reviw of Systems:  Reviewed in the HPI.  All other systems are negative.  Physical Exam: BP 139/73  Pulse 60  Ht 5\' 5"  (1.651 m)  Wt 200 lb 6.4 oz (90.901 kg)  BMI 33.35 kg/m2 The patient is alert and oriented x 3.  The mood and affect are normal.   Skin: warm and dry.  Color is normal.    HEENT:   Normocephalic, atraumatic. She has normal carotid speech is no JVD.  Lungs: Lungs are clear   Heart: Heart regular S1-S2    Abdomen: Abdominal exam is benign. She has good bowel sounds. There is no hepatosplenomegaly.  Extremities:  Shows no clubbing cyanosis or edema  Neuro:  Her neuro exam is nonfocal. Her gait is normal the    ECG: Normal sinus rhythm. Has no ST or T wave changes   Assessment / Plan:

## 2011-06-30 NOTE — Patient Instructions (Addendum)
Your physician wants you to follow-up in: 2-3 months You will receive a reminder letter in the mail two months in advance. If you don't receive a letter, please call our office to schedule the follow-up appointment.   Your physician has requested that you have en exercise stress myoview. For further information please visit [email or web address] Please follow instruction sheet, as given.

## 2011-07-12 ENCOUNTER — Ambulatory Visit (HOSPITAL_COMMUNITY): Payer: Medicare Other | Attending: Cardiology | Admitting: Radiology

## 2011-07-12 DIAGNOSIS — I2510 Atherosclerotic heart disease of native coronary artery without angina pectoris: Secondary | ICD-10-CM

## 2011-07-12 DIAGNOSIS — R0609 Other forms of dyspnea: Secondary | ICD-10-CM | POA: Insufficient documentation

## 2011-07-12 DIAGNOSIS — R079 Chest pain, unspecified: Secondary | ICD-10-CM

## 2011-07-12 DIAGNOSIS — F17200 Nicotine dependence, unspecified, uncomplicated: Secondary | ICD-10-CM | POA: Insufficient documentation

## 2011-07-12 DIAGNOSIS — I219 Acute myocardial infarction, unspecified: Secondary | ICD-10-CM

## 2011-07-12 DIAGNOSIS — R5381 Other malaise: Secondary | ICD-10-CM | POA: Insufficient documentation

## 2011-07-12 DIAGNOSIS — E7800 Pure hypercholesterolemia, unspecified: Secondary | ICD-10-CM

## 2011-07-12 DIAGNOSIS — Z794 Long term (current) use of insulin: Secondary | ICD-10-CM | POA: Insufficient documentation

## 2011-07-12 DIAGNOSIS — R0789 Other chest pain: Secondary | ICD-10-CM | POA: Insufficient documentation

## 2011-07-12 DIAGNOSIS — I10 Essential (primary) hypertension: Secondary | ICD-10-CM | POA: Insufficient documentation

## 2011-07-12 DIAGNOSIS — Z951 Presence of aortocoronary bypass graft: Secondary | ICD-10-CM | POA: Insufficient documentation

## 2011-07-12 DIAGNOSIS — Z8249 Family history of ischemic heart disease and other diseases of the circulatory system: Secondary | ICD-10-CM | POA: Insufficient documentation

## 2011-07-12 DIAGNOSIS — I252 Old myocardial infarction: Secondary | ICD-10-CM | POA: Insufficient documentation

## 2011-07-12 DIAGNOSIS — I739 Peripheral vascular disease, unspecified: Secondary | ICD-10-CM | POA: Insufficient documentation

## 2011-07-12 DIAGNOSIS — E119 Type 2 diabetes mellitus without complications: Secondary | ICD-10-CM | POA: Insufficient documentation

## 2011-07-12 DIAGNOSIS — R0989 Other specified symptoms and signs involving the circulatory and respiratory systems: Secondary | ICD-10-CM | POA: Insufficient documentation

## 2011-07-12 DIAGNOSIS — E785 Hyperlipidemia, unspecified: Secondary | ICD-10-CM | POA: Insufficient documentation

## 2011-07-12 MED ORDER — TECHNETIUM TC 99M TETROFOSMIN IV KIT
33.0000 | PACK | Freq: Once | INTRAVENOUS | Status: AC | PRN
  Administered 2011-07-12: 33 via INTRAVENOUS

## 2011-07-12 NOTE — Progress Notes (Signed)
[facility] SITE 3 NUCLEAR MED [address] [place] [address] [phone]  Cardiology Nuclear Med Study  Brenda Moreno is a 65 y.o. female [id number] 10/01/1945   Nuclear Med Background Indication for Stress Test:  Evaluation for Ischemia and Graft Patency History: 98 CABG x6,05 Heart Catheterization: EF=65-70% with occluded obtuse graft and treat medically, 85 Myocardial Infarction and Approximately 10 yrs ago Myocardial Perfusion Study:normal per patient, no record available Cardiac Risk Factors: Family History - CAD, Hypertension, IDDM Type 2, Lipids, 09 PVD;Iliac artery Stent and Smoker  Symptoms: Chest Pain,Pressure and Tightness at Rest and with exertion.(Last episode 2 months ago), DOE and Fatigue   Nuclear Pre-Procedure Caffeine/Decaff Intake:  None NPO After: 7:00pm   Lungs:  clear IV 0.9% NS with Angio Cath:  22g  IV Site: L Antecubital  IV Started by:  Stanton Kidney, EMT-P  Chest Size (in):  44  Cup Size: DD  Height: 5\' 5"  (1.651 m)  Weight:  200 lb (90.719 kg)  BMI:  Body mass index is 33.28 kg/(m^2). Tech Comments:  CBG=95 @ 8:00am, per patient. Metoprolol held > 48 hours, per patient.    Nuclear Med Study 1 or 2 day study: 1 day  Stress Test Type:  Stress  Reading MD: Olga Millers, MD  Order Authorizing Provider:  Jannette Spanner  Resting Radionuclide: Technetium 32m Tetrofosmin  Resting Radionuclide Dose: 33.0 mCi on 07/13/11   Stress Radionuclide:  Technetium 80m Tetrofosmin  Stress Radionuclide Dose: 33.0 mCi on 07/12/11           Stress Protocol Rest HR: 68 Stress HR: 162  Rest BP: 135/69 Stress BP: 212/85  Exercise Time (min): 5:23 METS: 6.10   Predicted Max HR: 157 bpm % Max HR: 103.18 bpm Rate Pressure Product: 21308   Dose of Adenosine (mg):  n/a Dose of Lexiscan: n/a mg  Dose of Atropine (mg): n/a Dose of Dobutamine: n/a mcg/kg/min (at max HR)  Stress Test Technologist: Cathlyn Parsons, RN  Nuclear Technologist:   Domenic Polite, CNMT     Rest Procedure:  Myocardial perfusion imaging was performed at rest 45 minutes following the intravenous administration of Technetium 71m Tetrofosmin. Rest ECG: NSR with T wave abnormality  Stress Procedure:  The patient exercised for 5:23. The patient stopped due to fatigue,hypertension and severe SOB and denied any chest pain.  There were no significant ST-T wave changes. Patient had occasional PAC's in recovery. Technetium 31m Tetrofosmin was injected at peak exercise and myocardial perfusion imaging was performed after a brief delay. Stress ECG: No significant ST segment change suggestive of ischemia.  QPS Raw Data Images:  Acquisition technically good; normal left ventricular size. Stress Images:  Normal homogeneous uptake in all areas of the myocardium. Rest Images:  Normal homogeneous uptake in all areas of the myocardium. Subtraction (SDS):  No evidence of ischemia. Transient Ischemic Dilatation (Normal <1.22):  1.05 Lung/Heart Ratio (Normal <0.45):  0.31  Quantitative Gated Spect Images QGS EDV:  62 ml QGS ESV:  21 ml QGS cine images:  NL LV Function; NL Wall Motion QGS EF: 67%  Impression Exercise Capacity:  Poor exercise capacity. BP Response:  Hypertensive blood pressure response. Clinical Symptoms:  No chest pain. ECG Impression:  No significant ST segment change suggestive of ischemia. Comparison with Prior Nuclear Study: No images to compare  Overall Impression:  Normal stress nuclear study.  Olga Millers

## 2011-07-13 ENCOUNTER — Ambulatory Visit (HOSPITAL_COMMUNITY): Payer: Medicare Other | Attending: Cardiology | Admitting: Radiology

## 2011-07-13 DIAGNOSIS — R0989 Other specified symptoms and signs involving the circulatory and respiratory systems: Secondary | ICD-10-CM

## 2011-07-13 MED ORDER — TECHNETIUM TC 99M TETROFOSMIN IV KIT
33.0000 | PACK | Freq: Once | INTRAVENOUS | Status: AC | PRN
  Administered 2011-07-13: 33 via INTRAVENOUS

## 2011-09-02 ENCOUNTER — Other Ambulatory Visit: Payer: Medicare Other

## 2011-09-02 ENCOUNTER — Ambulatory Visit: Payer: Medicare Other | Admitting: Vascular Surgery

## 2011-09-07 ENCOUNTER — Ambulatory Visit (INDEPENDENT_AMBULATORY_CARE_PROVIDER_SITE_OTHER): Payer: Medicare Other | Admitting: Cardiovascular Disease

## 2011-09-07 ENCOUNTER — Encounter: Payer: Self-pay | Admitting: Cardiovascular Disease

## 2011-09-07 DIAGNOSIS — I2510 Atherosclerotic heart disease of native coronary artery without angina pectoris: Secondary | ICD-10-CM

## 2011-09-07 DIAGNOSIS — I10 Essential (primary) hypertension: Secondary | ICD-10-CM

## 2011-09-07 NOTE — Assessment & Plan Note (Signed)
It is doing quite well. She's not had any episodes of chest pain or shortness of breath. He's been exercising a lot today without any difficulties. Her stress Myoview study was normal.  I've asked to continue with a good diet and exercise program. I've asked her to try to cut back her smoking and eventually stop smoking. I'll see her in 3 months primarily to check her blood pressure.

## 2011-09-07 NOTE — Assessment & Plan Note (Signed)
Her current medications include amlodipine 5 mg, Accupril 40 mg a day, HCTZ 25 mg a day, and Toprol-XL 100 mg a day. She still eats some extra salt. We'll have her cut back her salt intake and continued exercise. I've encouraged her to lose weight. I'll see her again in 3 months.

## 2011-09-07 NOTE — Progress Notes (Signed)
Pincus Badder Date of Birth  08/23/1946 [facility] [address]    [address] [place], [place]  [address] [phone]  Fax  [phone]  History of Present Illness:  Brenda Moreno a 66 year old female with a history of coronary artery disease but she status post coronary artery bypass grafting in 1998. I last saw her approximately 10 years ago. She presents today for further evaluation of mild chest pain.  The chest pains occurred with rest and lasted 5 minutes.  She did not take NTG.  She has been exercising at the [facility]  on a regular basis.  She had a stress test after I saw her in November which was negative for ischemia.  She still eats extra salt.     Current Outpatient Prescriptions on File Prior to Visit  Medication Sig Dispense Refill  . amLODipine (NORVASC) 5 MG tablet Take 5 mg by mouth daily.        Marland Kitchen aspirin EC 81 MG tablet Take 81 mg by mouth daily.        Marland Kitchen CINNAMON PO Take 1,000 mg by mouth 2 (two) times daily.        . Coenzyme Q10 (CO Q 10 PO) Take 100 mg by mouth daily.        . hydrochlorothiazide (HYDRODIURIL) 25 MG tablet Take 25 mg by mouth daily.        . insulin aspart (NOVOLOG) 100 UNIT/ML injection Inject 10 Units into the skin 3 (three) times daily before meals.        . insulin glargine (LANTUS) 100 UNIT/ML injection Inject 25 Units into the skin at bedtime.       . metFORMIN (GLUMETZA) 1000 MG (MOD) 24 hr tablet Take 1,000 mg by mouth daily with breakfast.       . metoprolol (TOPROL-XL) 100 MG 24 hr tablet Take 100 mg by mouth daily.        . pravastatin ([name]) 20 MG tablet Take 20 mg by mouth daily.        . quinapril (ACCUPRIL) 40 MG tablet Take 40 mg by mouth daily.         No Known Allergies  Past Medical History  Diagnosis Date  . Hyperlipidemia   . Hypertension   . CHF (congestive heart failure)   . Arthritis   . Leg pain   . Joint pain   . Myocardial infarction   . Carotid artery occlusion   . Diabetes mellitus   . CAD (coronary  artery disease)     S/P cabg    Past Surgical History  Procedure Date  . Coronary artery bypass graft     1998  . Appendectomy   . Tubal ligation   . Cholecystectomy     Gall bladder  . Pr vein bypass graft,aorto-fem-pop     History  Smoking status  . Current Everyday Smoker -- 0.2 packs/day  . Types: Cigarettes  Smokeless tobacco  . Not on file    History  Alcohol Use No    Family History  Problem Relation Age of Onset  . Heart disease Mother   . Heart disease Father   . Heart disease Sister   . Coronary artery disease Other     Reviw of Systems:  Reviewed in the HPI.  All other systems are negative.  Physical Exam: BP 161/81  Pulse 61  Ht 5\' 5"  (1.651 m)  Wt 202 lb (91.627 kg)  BMI 33.61 kg/m2 The patient is alert and  oriented x 3.  The mood and affect are normal.   Skin: warm and dry.  Color is normal.    HEENT:   Normocephalic, atraumatic. She has normal carotid speech is no JVD.  Lungs: Lungs are clear   Heart: Heart regular S1-S2    Abdomen: Abdominal exam is benign. She has good bowel sounds. There is no hepatosplenomegaly.  Extremities:  Shows no clubbing cyanosis or edema  Neuro:  Her neuro exam is nonfocal. Her gait is normal the    ECG: Normal sinus rhythm. Has no ST or T wave changes   Assessment / Plan:

## 2011-09-07 NOTE — Patient Instructions (Signed)
Your physician wants you to follow-up in: 3 months app set.  Your physician recommends that you continue on your current medications as directed. Please refer to the Current Medication list given to you today.

## 2011-09-21 ENCOUNTER — Encounter: Payer: Self-pay | Admitting: Vascular Surgery

## 2011-09-22 ENCOUNTER — Encounter: Payer: Self-pay | Admitting: Vascular Surgery

## 2011-09-22 ENCOUNTER — Ambulatory Visit (INDEPENDENT_AMBULATORY_CARE_PROVIDER_SITE_OTHER): Payer: Medicare Other | Admitting: Vascular Surgery

## 2011-09-22 ENCOUNTER — Other Ambulatory Visit (INDEPENDENT_AMBULATORY_CARE_PROVIDER_SITE_OTHER): Payer: Medicare Other | Admitting: *Deleted

## 2011-09-22 VITALS — BP 131/70 | HR 57 | Resp 18 | Ht 65.0 in | Wt 198.0 lb

## 2011-09-22 DIAGNOSIS — I739 Peripheral vascular disease, unspecified: Secondary | ICD-10-CM

## 2011-09-22 DIAGNOSIS — I70219 Atherosclerosis of native arteries of extremities with intermittent claudication, unspecified extremity: Secondary | ICD-10-CM | POA: Insufficient documentation

## 2011-09-22 NOTE — Progress Notes (Signed)
[facility] [place] HISTORY AND PHYSICAL   History of Present Illness:  Patient is a 66 y.o. year old female who presents for follow-up evaluation for PAD.  She is on Aspirin for antiplatelet therapy. Her atherosclerotic risk factors remain diabetes, elevated cholesterol, hypertension, smoking (quit 2 weeks ago), and coronary artery disease.  These are all currently stable and followed by the primary care physician.  The patient currently has claudication symptoms that occur in the right/left leg at 1/2 mile distance on a treadmill.  The patient denies rest pain or ulcers on the feet. She underwent right common iliac artery stenting in June of 2007  Past Medical History  Diagnosis Date  . Hyperlipidemia   . Hypertension   . CHF (congestive heart failure)   . Arthritis   . Leg pain   . Joint pain   . Myocardial infarction   . Carotid artery occlusion   . Diabetes mellitus   . CAD (coronary artery disease)     S/P cabg  . Peripheral vascular disease     Past Surgical History  Procedure Date  . Coronary artery bypass graft     1998  . Appendectomy   . Tubal ligation   . Cholecystectomy     Gall bladder  . Pr vein bypass graft,aorto-fem-pop     Review of Systems:  Neurologic: sensation in the feet is intact Cardiac:denies shortness of breath or chest pain Pulmonary: denies cough or wheeze  Social History History  Substance Use Topics  . Smoking status: Former Smoker -- 0.2 packs/day    Types: Cigarettes    Quit date: 09/13/2011  . Smokeless tobacco: Not on file  . Alcohol Use: No    Allergies  No Known Allergies   Current Outpatient Prescriptions  Medication Sig Dispense Refill  . amLODipine (NORVASC) 5 MG tablet Take 5 mg by mouth daily.        Marland Kitchen aspirin EC 81 MG tablet Take 81 mg by mouth daily.        Marland Kitchen CINNAMON PO Take 1,000 mg by mouth 2 (two) times daily.        . Coenzyme Q10 (CO Q 10 PO) Take 100 mg by mouth daily.        .  hydrochlorothiazide (HYDRODIURIL) 25 MG tablet Take 25 mg by mouth daily.        . insulin aspart (NOVOLOG) 100 UNIT/ML injection Inject 10 Units into the skin 3 (three) times daily before meals.        . insulin glargine (LANTUS) 100 UNIT/ML injection Inject 25 Units into the skin at bedtime.       . metFORMIN (GLUMETZA) 1000 MG (MOD) 24 hr tablet Take 1,000 mg by mouth 2 (two) times daily with a meal.       . metoprolol (TOPROL-XL) 100 MG 24 hr tablet Take 100 mg by mouth daily.        . pravastatin ([name]) 20 MG tablet Take 20 mg by mouth daily.        . quinapril (ACCUPRIL) 40 MG tablet Take 40 mg by mouth 2 (two) times daily.         Physical Examination  Filed Vitals:   09/22/11 1459  BP: 131/70  Pulse: 57  Resp: 18  Height: 5\' 5"  (1.651 m)  Weight: 198 lb (89.812 kg)  SpO2: 100%    Body mass index is 32.95 kg/(m^2).  General:  Alert and oriented, no acute distress HEENT: Normal Neck:  No bruit or JVD Pulmonary: Clear to auscultation bilaterally Cardiac: Regular Rate and Rhythm without murmur Neurologic: Upper and lower extremity motor 5/5 and symmetric Extremities:  2+ femoral  Absent popliteal and pedal pulses Skin: no ulcer or rash  DATA: She had bilateral ABIs performed today which shows an ABI on the right of 0.8 to left 0.86 she is triphasic waveforms bilaterally   ASSESSMENT: Stable peripheral arterial disease with minimal claudication symptoms   PLAN: She will followup in one year with repeat lab and be placed in our surveillance protocol  Fabienne Bruns, MD [facility] Office: [phone] Pager: [phone]

## 2011-09-26 NOTE — Progress Notes (Signed)
Addended by: Sharee Pimple on: 09/26/2011 12:46 PM   Modules accepted: Orders

## 2011-11-24 ENCOUNTER — Ambulatory Visit: Payer: Medicare Other | Admitting: Cardiovascular Disease

## 2012-01-16 ENCOUNTER — Other Ambulatory Visit: Payer: Self-pay | Admitting: Family Medicine

## 2012-01-16 DIAGNOSIS — I739 Peripheral vascular disease, unspecified: Secondary | ICD-10-CM

## 2012-01-27 ENCOUNTER — Ambulatory Visit
Admission: RE | Admit: 2012-01-27 | Discharge: 2012-01-27 | Disposition: A | Discharge status: Home / Self Care | Payer: Medicare Other | Source: Ambulatory Visit | Attending: Family Medicine | Admitting: Family Medicine

## 2012-01-27 ENCOUNTER — Other Ambulatory Visit: Payer: Self-pay | Admitting: Family Medicine

## 2012-01-27 DIAGNOSIS — M542 Cervicalgia: Secondary | ICD-10-CM

## 2012-01-27 DIAGNOSIS — I739 Peripheral vascular disease, unspecified: Secondary | ICD-10-CM

## 2012-02-02 ENCOUNTER — Other Ambulatory Visit: Payer: Self-pay | Admitting: Family Medicine

## 2012-02-02 DIAGNOSIS — M545 Low back pain: Secondary | ICD-10-CM

## 2012-02-07 ENCOUNTER — Ambulatory Visit
Admission: RE | Admit: 2012-02-07 | Discharge: 2012-02-07 | Disposition: A | Discharge status: Home / Self Care | Payer: Medicare Other | Source: Ambulatory Visit | Attending: Family Medicine | Admitting: Family Medicine

## 2012-02-07 DIAGNOSIS — M545 Low back pain: Secondary | ICD-10-CM

## 2012-02-09 ENCOUNTER — Other Ambulatory Visit: Payer: Self-pay | Admitting: Family Medicine

## 2012-02-09 DIAGNOSIS — M5410 Radiculopathy, site unspecified: Secondary | ICD-10-CM

## 2012-02-14 ENCOUNTER — Ambulatory Visit
Admission: RE | Admit: 2012-02-14 | Discharge: 2012-02-14 | Disposition: A | Discharge status: Home / Self Care | Payer: Medicare Other | Source: Ambulatory Visit | Attending: Family Medicine | Admitting: Family Medicine

## 2012-02-14 VITALS — BP 181/84 | HR 66

## 2012-02-14 DIAGNOSIS — M5410 Radiculopathy, site unspecified: Secondary | ICD-10-CM

## 2012-02-14 DIAGNOSIS — M5126 Other intervertebral disc displacement, lumbar region: Secondary | ICD-10-CM

## 2012-02-14 MED ORDER — METHYLPREDNISOLONE ACETATE 40 MG/ML INJ SUSP (RADIOLOG
120.0000 mg | Freq: Once | INTRAMUSCULAR | Status: AC
  Administered 2012-02-14: 120 mg via EPIDURAL

## 2012-02-14 MED ORDER — IOHEXOL 180 MG/ML  SOLN
1.0000 mL | Freq: Once | INTRAMUSCULAR | Status: AC | PRN
  Administered 2012-02-14: 1 mL via EPIDURAL

## 2012-02-14 NOTE — Discharge Instructions (Signed)

## 2012-06-14 LAB — HM DIABETES EYE EXAM

## 2012-09-10 ENCOUNTER — Other Ambulatory Visit: Payer: Self-pay | Admitting: Family Medicine

## 2012-09-10 DIAGNOSIS — Z1231 Encounter for screening mammogram for malignant neoplasm of breast: Secondary | ICD-10-CM

## 2012-09-14 ENCOUNTER — Other Ambulatory Visit: Payer: Self-pay | Admitting: *Deleted

## 2012-09-14 DIAGNOSIS — Z48812 Encounter for surgical aftercare following surgery on the circulatory system: Secondary | ICD-10-CM

## 2012-09-14 DIAGNOSIS — I739 Peripheral vascular disease, unspecified: Secondary | ICD-10-CM

## 2012-09-20 ENCOUNTER — Ambulatory Visit: Payer: Medicare Other | Admitting: Vascular Surgery

## 2012-09-20 ENCOUNTER — Other Ambulatory Visit: Payer: Medicare Other

## 2012-09-26 ENCOUNTER — Encounter: Payer: Self-pay | Admitting: Neurosurgery

## 2012-09-27 ENCOUNTER — Ambulatory Visit: Payer: Medicare Other | Admitting: Vascular Surgery

## 2012-09-27 ENCOUNTER — Encounter (INDEPENDENT_AMBULATORY_CARE_PROVIDER_SITE_OTHER): Payer: Medicare Other | Admitting: *Deleted

## 2012-09-27 ENCOUNTER — Encounter: Payer: Self-pay | Admitting: Neurosurgery

## 2012-09-27 ENCOUNTER — Other Ambulatory Visit (INDEPENDENT_AMBULATORY_CARE_PROVIDER_SITE_OTHER): Payer: Medicare Other | Admitting: *Deleted

## 2012-09-27 ENCOUNTER — Ambulatory Visit (INDEPENDENT_AMBULATORY_CARE_PROVIDER_SITE_OTHER): Payer: Medicare Other | Admitting: Neurosurgery

## 2012-09-27 VITALS — BP 149/78 | HR 57 | Resp 16 | Ht 65.0 in | Wt 195.0 lb

## 2012-09-27 DIAGNOSIS — Z48812 Encounter for surgical aftercare following surgery on the circulatory system: Secondary | ICD-10-CM

## 2012-09-27 DIAGNOSIS — I739 Peripheral vascular disease, unspecified: Secondary | ICD-10-CM

## 2012-09-27 DIAGNOSIS — R252 Cramp and spasm: Secondary | ICD-10-CM | POA: Insufficient documentation

## 2012-09-27 NOTE — Addendum Note (Signed)
Addended by: Sharee Pimple on: 09/27/2012 02:54 PM   Modules accepted: Orders

## 2012-09-27 NOTE — Progress Notes (Signed)
[facility] [place] PAD/PVD Office Note  CC: PAD surveillance Referring Physician: Fields  History of Present Illness: 67 year old female patient of Dr. Darrick Penna status post a right CIA stent in 2007. The patient denies claudication or rest pain. The patient denies any new medical diagnoses or recent surgery however she has had problems with her cholesterol medicine and is trying to change her diet to lower her cholesterol.  Past Medical History  Diagnosis Date  . Hyperlipidemia   . Hypertension   . CHF (congestive heart failure)   . Arthritis   . Leg pain   . Joint pain   . Myocardial infarction   . Carotid artery occlusion   . Diabetes mellitus   . CAD (coronary artery disease)     S/P cabg  . Peripheral vascular disease     ROS: [x]  Positive   [ ]  Denies    General: [ ]  Weight loss, [ ]  Fever, [ ]  chills Neurologic: [ ]  Dizziness, [ ]  Blackouts, [ ]  Seizure [ ]  Stroke, [ ]  "Mini stroke", [ ]  Slurred speech, [ ]  Temporary blindness; [ ]  weakness in arms or legs, [ ]  Hoarseness Cardiac: [ ]  Chest pain/pressure, [ ]  Shortness of breath at rest [ ]  Shortness of breath with exertion, [ ]  Atrial fibrillation or irregular heartbeat Vascular: [ ]  Pain in legs with walking, [ ]  Pain in legs at rest, [ ]  Pain in legs at night,  [ ]  Non-healing ulcer, [ ]  Blood clot in vein/DVT,   Pulmonary: [ ]  Home oxygen, [ ]  Productive cough, [ ]  Coughing up blood, [ ]  Asthma,  [ ]  Wheezing Musculoskeletal:  [ ]  Arthritis, [ ]  Low back pain, [ ]  Joint pain Hematologic: [ ]  Easy Bruising, [ ]  Anemia; [ ]  Hepatitis Gastrointestinal: [ ]  Blood in stool, [ ]  Gastroesophageal Reflux/heartburn, [ ]  Trouble swallowing Urinary: [ ]  chronic Kidney disease, [ ]  on HD - [ ]  MWF or [ ]  TTHS, [ ]  Burning with urination, [ ]  Difficulty urinating Skin: [ ]  Rashes, [ ]  Wounds Psychological: [ ]  Anxiety, [ ]  Depression   Social History History  Substance Use Topics  . Smoking status:  Former Smoker -- 0.2 packs/day    Types: Cigarettes    Quit date: 09/13/2011  . Smokeless tobacco: Never Used  . Alcohol Use: No    Family History Family History  Problem Relation Age of Onset  . Heart disease Mother   . Diabetes Mother   . Hyperlipidemia Mother   . Hypertension Mother   . Heart disease Father   . Diabetes Father   . Hyperlipidemia Father   . Hypertension Father   . Heart disease Sister     heart attack  . Diabetes Sister   . Hypertension Sister   . Other Sister     history of amputation  . Coronary artery disease Other   . Diabetes Brother   . Hypertension Brother     No Known Allergies  Current Outpatient Prescriptions  Medication Sig Dispense Refill  . amLODipine (NORVASC) 5 MG tablet Take 5 mg by mouth daily.        Marland Kitchen aspirin EC 81 MG tablet Take 81 mg by mouth daily.        Marland Kitchen CINNAMON PO Take 1,000 mg by mouth 2 (two) times daily.        . insulin aspart (NOVOLOG) 100 UNIT/ML injection Inject 10 Units into the skin 3 (three) times daily before  meals.        . insulin glargine (LANTUS) 100 UNIT/ML injection Inject 25 Units into the skin at bedtime.       Marland Kitchen LIVALO 2 MG TABS daily.      . metFORMIN (GLUMETZA) 1000 MG (MOD) 24 hr tablet Take 1,000 mg by mouth 2 (two) times daily with a meal.       . metoprolol (TOPROL-XL) 100 MG 24 hr tablet Take 100 mg by mouth daily.        . quinapril (ACCUPRIL) 40 MG tablet Take 40 mg by mouth 2 (two) times daily.       . traMADol (ULTRAM) 50 MG tablet as needed.      . Coenzyme Q10 (CO Q 10 PO) Take 100 mg by mouth daily.        . hydrochlorothiazide (HYDRODIURIL) 25 MG tablet Take 25 mg by mouth daily.        . pravastatin ([name]) 20 MG tablet Take 20 mg by mouth daily.          Physical Examination  Filed Vitals:   09/27/12 1030  BP: 149/78  Pulse: 57  Resp: 16    Body mass index is 32.45 kg/(m^2).  General:  WDWN in NAD Gait: Normal HEENT: WNL Eyes: Pupils equal Pulmonary: normal non-labored  breathing , without Rales, rhonchi,  wheezing Cardiac: RRR, without  Murmurs, rubs or gallops; No carotid bruits Abdomen: soft, NT, no masses Skin: no rashes, ulcers noted Vascular Exam/Pulses: Palpable lower extremity pulses bilaterally  Extremities without ischemic changes, no Gangrene , no cellulitis; no open wounds;  Musculoskeletal: no muscle wasting or atrophy  Neurologic: A&O X 3; Appropriate Affect ; SENSATION: normal; MOTOR FUNCTION:  moving all extremities equally. Speech is fluent/normal  Non-Invasive Vascular Imaging: ABIs today are 0.98 triphasic on the right, 1.01 on the left which is improved from previous exam one year ago. There is a patent right CIA stent with turbulent flow.  ASSESSMENT/PLAN: Asymptomatic patient will followup in one year with ABIs and repeat aortoiliac duplex. The patient's questions were encouraged and answered, she is in agreement with this plan.  [name] ANP  Clinic M.D.: [facility]

## 2012-10-02 ENCOUNTER — Ambulatory Visit
Admission: RE | Admit: 2012-10-02 | Discharge: 2012-10-02 | Disposition: A | Discharge status: Home / Self Care | Payer: Medicare Other | Source: Ambulatory Visit | Attending: Family Medicine | Admitting: Family Medicine

## 2012-10-02 DIAGNOSIS — Z1231 Encounter for screening mammogram for malignant neoplasm of breast: Secondary | ICD-10-CM

## 2012-10-04 ENCOUNTER — Other Ambulatory Visit: Payer: Self-pay | Admitting: Family Medicine

## 2012-10-04 DIAGNOSIS — R928 Other abnormal and inconclusive findings on diagnostic imaging of breast: Secondary | ICD-10-CM

## 2012-10-16 ENCOUNTER — Ambulatory Visit
Admission: RE | Admit: 2012-10-16 | Discharge: 2012-10-16 | Disposition: A | Discharge status: Home / Self Care | Payer: Medicare Other | Source: Ambulatory Visit | Attending: Family Medicine | Admitting: Family Medicine

## 2012-10-16 DIAGNOSIS — R928 Other abnormal and inconclusive findings on diagnostic imaging of breast: Secondary | ICD-10-CM

## 2012-10-29 LAB — TSH: TSH: 1.68 u[IU]/mL (ref <=5.90)

## 2012-10-29 LAB — HEMOGLOBIN A1C: Hgb A1c MFr Bld: 8 % — AB (ref 4.0–6.0)

## 2012-11-15 ENCOUNTER — Ambulatory Visit (INDEPENDENT_AMBULATORY_CARE_PROVIDER_SITE_OTHER): Payer: Medicare Other | Admitting: Physician Assistant

## 2012-11-15 ENCOUNTER — Telehealth: Payer: Self-pay | Admitting: Physician Assistant

## 2012-11-15 ENCOUNTER — Encounter: Payer: Self-pay | Admitting: Physician Assistant

## 2012-11-15 VITALS — BP 132/66 | HR 52 | Temp 98.4°F | Resp 18 | Ht 64.0 in | Wt 199.0 lb

## 2012-11-15 DIAGNOSIS — J322 Chronic ethmoidal sinusitis: Secondary | ICD-10-CM

## 2012-11-15 DIAGNOSIS — J440 Chronic obstructive pulmonary disease with acute lower respiratory infection: Secondary | ICD-10-CM

## 2012-11-15 MED ORDER — AZITHROMYCIN 250 MG PO TABS: ORAL_TABLET | ORAL | Status: DC

## 2012-11-15 MED ORDER — LEVOFLOXACIN 750 MG PO TABS: 750.0000 mg | ORAL_TABLET | Freq: Every day | ORAL | Status: DC

## 2012-11-15 MED ORDER — PREDNISONE 20 MG PO TABS: ORAL_TABLET | ORAL | Status: DC

## 2012-11-15 NOTE — Telephone Encounter (Signed)
I saw that pt did not want zpack. I have e-scribed levaquin for her to use instead. I left message on pts voicemail that rx at pharmacy.

## 2012-11-15 NOTE — Progress Notes (Signed)
Patient ID: Brenda Moreno MRN: [id number], DOB: 1946-01-26, 67 y.o. Date of Encounter: 11/15/2012, 11:24 AM    Chief Complaint:  Head and chest congestion  HPI: 67 y.o. year old female reports that she started feeling sick approx. 2 weeks ago. Started with head/nasal congestion and mucus. Still with "pain in her face" and nasal mucus but now chest congestion is a lot worse so came in for eval. Using Mucinex but now is unable to get phlegm out like she was able to do several days ago. No ST, ear pain, fever/chills.     Home Meds: Current Outpatient Prescriptions on File Prior to Visit  Medication Sig Dispense Refill  . amLODipine (NORVASC) 5 MG tablet Take 5 mg by mouth daily.        Marland Kitchen aspirin EC 81 MG tablet Take 81 mg by mouth daily.        . Coenzyme Q10 (CO Q 10 PO) Take 100 mg by mouth daily.        . insulin aspart (NOVOLOG) 100 UNIT/ML injection Inject 10 Units into the skin 3 (three) times daily before meals.        . insulin glargine (LANTUS) 100 UNIT/ML injection Inject 30 Units into the skin at bedtime.       Marland Kitchen LIVALO 2 MG TABS daily.      . metFORMIN (GLUMETZA) 1000 MG (MOD) 24 hr tablet Take 1,000 mg by mouth 2 (two) times daily with a meal.       . metoprolol (TOPROL-XL) 100 MG 24 hr tablet Take 100 mg by mouth daily.        . quinapril (ACCUPRIL) 40 MG tablet Take 40 mg by mouth 2 (two) times daily.       . traMADol (ULTRAM) 50 MG tablet as needed.      Marland Kitchen CINNAMON PO Take 1,000 mg by mouth 2 (two) times daily.        . hydrochlorothiazide (HYDRODIURIL) 25 MG tablet Take 25 mg by mouth daily.        . pravastatin ([name]) 20 MG tablet Take 20 mg by mouth daily.         No current facility-administered medications on file prior to visit.    Allergies:  Allergies  Allergen Reactions  . Statins Other (See Comments)    Severe myalgias to lipitor, crestor, pravastaitin      Review of Systems: Constitutional: negative for chills, fever HEENT: negative for vision  changes, hearing loss, ST, epistaxis Cardiovascular: negative for chest pain or palpitations Respiratory: negative for hemoptysis. See hpi for positives. Abdominal: negative for abdominal pain, nausea, vomiting, diarrhea, or constipation Dermatological: negative for rash Neurologic: negative for headache, dizziness, or syncope    Physical Exam: Blood pressure 132/66, pulse 52, temperature 98.4 F (36.9 C), temperature source Oral, resp. rate 18, height 5\' 4"  (1.626 m), weight 199 lb (90.266 kg)., Body mass index is 34.14 kg/(m^2). General: Well developed, well nourished, in no acute distress. HEENT: Normocephalic, atraumatic, eyes without discharge, sclera non-icteric, nares are without discharge. Bilateral auditory canals clear, TM's are without perforation, pearly grey and translucent with reflective cone of light bilaterally. Oral cavity moist, posterior pharynx without exudate, erythema, peritonsillar abscess, or post nasal drip.Mild tenderness with percussion of maxilary sinuses, left > right.  Neck: Supple. No thyromegaly. Full ROM. No lymphadenopathy. Lungs: Clear bilaterally to auscultation without rales, or rhonchi. Breathing is unlabored.Very minimal wheezes heard scattered throughout bilaterally. Heart: RRR with S1 S2. No murmurs, rubs, or  gallops appreciated. Psych:  Responds to questions appropriately with a normal affect.     ASSESSMENT AND PLAN:  67 y.o. year old female with IDDM and smoking now with acute on chronic bronchitis and sinusitis. Treat with  Azithromycin and Prednisone taper. Instructed to adjust insulin as needed as she will probably have elevated b.s. While on prednisone. Cont mucinex DM.Marland Kitchen F/U if worsens or does not resolve.  -  Signed, [address] [place], [place], [facility] 11/15/2012 11:24 AM

## 2012-11-29 ENCOUNTER — Ambulatory Visit (INDEPENDENT_AMBULATORY_CARE_PROVIDER_SITE_OTHER): Payer: Medicare Other | Admitting: Family Medicine

## 2012-11-29 ENCOUNTER — Encounter: Payer: Self-pay | Admitting: Family Medicine

## 2012-11-29 VITALS — BP 128/70 | HR 64 | Temp 98.4°F | Resp 16 | Wt 193.0 lb

## 2012-11-29 DIAGNOSIS — I739 Peripheral vascular disease, unspecified: Secondary | ICD-10-CM

## 2012-11-29 DIAGNOSIS — I10 Essential (primary) hypertension: Secondary | ICD-10-CM | POA: Insufficient documentation

## 2012-11-29 DIAGNOSIS — E1159 Type 2 diabetes mellitus with other circulatory complications: Secondary | ICD-10-CM

## 2012-11-29 DIAGNOSIS — E785 Hyperlipidemia, unspecified: Secondary | ICD-10-CM | POA: Insufficient documentation

## 2012-11-29 DIAGNOSIS — M1990 Unspecified osteoarthritis, unspecified site: Secondary | ICD-10-CM | POA: Insufficient documentation

## 2012-11-29 DIAGNOSIS — E119 Type 2 diabetes mellitus without complications: Secondary | ICD-10-CM | POA: Insufficient documentation

## 2012-11-29 DIAGNOSIS — I219 Acute myocardial infarction, unspecified: Secondary | ICD-10-CM | POA: Insufficient documentation

## 2012-11-29 DIAGNOSIS — I5032 Chronic diastolic (congestive) heart failure: Secondary | ICD-10-CM | POA: Insufficient documentation

## 2012-11-29 NOTE — Progress Notes (Signed)
Subjective:     Patient ID: Brenda Moreno, female   DOB: Apr 26, 1946, 67 y.o.   MRN: [id number]  HPI #1 insulin-dependent diabetes mellitus. The patient is currently using Lantus 33 units subcutaneous each bedtime. She is also on metformin 1000 mg by mouth twice a day. She is also using NovoLog 10 units subcutaneous with meals bid.  Her fasting blood sugars are reportedly 90-110. However her hemoglobin A1c was recently found to be 8.  She has a significant past medical history of coronary artery disease status post myocardial infarction and peripheral vascular disease status post aortofemoral bypass graft.  She reports occasional numbness and tingling in her feet. She denies any current symptoms of claudication in the legs.  #2 is hyperlipidemia. The patient has been intolerant of all statins.  We convinced her to start Livalo 2 mg by mouth daily. She denies any myalgias or right upper quadrant pain.  Recent labs revealed this rise of 158 HDL 43 and LDL of 104.    #3 is hypertension-her medication list is reviewed. She denies any chest pain, shortness of breath, or dyspnea on exertion at present. Past Medical History  Diagnosis Date  . Leg pain   . Joint pain   . Carotid artery occlusion   . CAD (coronary artery disease)     S/P cabg  . Peripheral vascular disease   . Arthritis   . CHF (congestive heart failure)   . Diabetes mellitus   . Hyperlipidemia   . Hypertension   . Myocardial infarction   . PVD (peripheral vascular disease)    Current Outpatient Prescriptions on File Prior to Visit  Medication Sig Dispense Refill  . amLODipine (NORVASC) 5 MG tablet Take 5 mg by mouth daily.        Marland Kitchen aspirin EC 81 MG tablet Take 81 mg by mouth daily.        Marland Kitchen CINNAMON PO Take 1,000 mg by mouth 2 (two) times daily.        . Coenzyme Q10 (CO Q 10 PO) Take 100 mg by mouth daily.        . insulin aspart (NOVOLOG) 100 UNIT/ML injection Inject 10 Units into the skin 3 (three) times daily before meals.         . insulin glargine (LANTUS) 100 UNIT/ML injection Inject 30 Units into the skin at bedtime.       Marland Kitchen LIVALO 2 MG TABS daily.      . metFORMIN (GLUMETZA) 1000 MG (MOD) 24 hr tablet Take 1,000 mg by mouth 2 (two) times daily with a meal.       . metoprolol (TOPROL-XL) 100 MG 24 hr tablet Take 100 mg by mouth daily.        . quinapril (ACCUPRIL) 40 MG tablet Take 40 mg by mouth 2 (two) times daily.       . traMADol (ULTRAM) 50 MG tablet as needed.       No current facility-administered medications on file prior to visit.   History   Social History  . Marital Status: Divorced    Spouse Name: N/A    Number of Children: N/A  . Years of Education: N/A   Occupational History  . Not on file.   Social History Main Topics  . Smoking status: Former Smoker -- 0.25 packs/day    Types: Cigarettes    Quit date: 09/13/2011  . Smokeless tobacco: Never Used  . Alcohol Use: No  . Drug Use: No  . Sexually  Active:    Other Topics Concern  . Not on file   Social History Narrative  . No narrative on file     Review of Systems    view systems is otherwise negative Objective:   Physical Exam  Constitutional: She appears well-developed and well-nourished.  HENT:  Head: Normocephalic.  Right Ear: External ear normal.  Left Ear: External ear normal.  Eyes: Conjunctivae are normal. Pupils are equal, round, and reactive to light.  Neck: Normal range of motion. Neck supple. No JVD present. No thyromegaly present.  Cardiovascular: Normal rate, regular rhythm, normal heart sounds and intact distal pulses.   Pulses:      Dorsalis pedis pulses are 1+ on the right side, and 1+ on the left side.       Posterior tibial pulses are 1+ on the right side, and 1+ on the left side.  Pulmonary/Chest: Effort normal and breath sounds normal. No respiratory distress. She has no wheezes. She has no rales. She exhibits no tenderness.  Abdominal: Soft. Bowel sounds are normal. She exhibits no distension and  no mass. There is no tenderness. There is no rebound and no guarding.   she has 4 over 4 sensation to 10 mg monofilament bilaterally. She does have decreased sensation to vibration as tested with tuning fork.     Assessment:     Insulin-dependent diabetes uncontrolled with neuropathy and peripheral vascular disease Peripheral vascular disease Hypertension Hyperlipidemia     Plan:     I asked the patient to check her two-hour postprandial sugars every day for one week. She is to record these. I asked her to bring them in so that I can review them. I will then titrate her NovoLog to achieve two-hour postprandial sugars of less than 160.  Completed form for diabetic shoes given her peripheral vascular disease.  I also asked the patient to double her Livalo 4 mg by mouth daily.  Recheck labs in 3 months.

## 2012-12-19 ENCOUNTER — Telehealth: Payer: Self-pay | Admitting: Family Medicine

## 2012-12-20 NOTE — Telephone Encounter (Signed)
error 

## 2012-12-21 ENCOUNTER — Telehealth: Payer: Self-pay | Admitting: Family Medicine

## 2012-12-21 MED ORDER — PITAVASTATIN CALCIUM 4 MG PO TABS: 4.0000 mg | ORAL_TABLET | Freq: Every day | ORAL | Status: DC

## 2012-12-21 NOTE — Telephone Encounter (Signed)
Rx Refilled  

## 2012-12-24 ENCOUNTER — Encounter: Payer: Self-pay | Admitting: Physician Assistant

## 2012-12-24 ENCOUNTER — Ambulatory Visit (INDEPENDENT_AMBULATORY_CARE_PROVIDER_SITE_OTHER): Payer: Medicare Other | Admitting: Physician Assistant

## 2012-12-24 ENCOUNTER — Ambulatory Visit
Admission: RE | Admit: 2012-12-24 | Discharge: 2012-12-24 | Disposition: A | Discharge status: Home / Self Care | Payer: Medicare Other | Source: Ambulatory Visit | Attending: Physician Assistant | Admitting: Physician Assistant

## 2012-12-24 VITALS — BP 152/88 | HR 56 | Temp 97.3°F | Resp 18 | Wt 194.0 lb

## 2012-12-24 DIAGNOSIS — M79672 Pain in left foot: Secondary | ICD-10-CM

## 2012-12-24 DIAGNOSIS — M79609 Pain in unspecified limb: Secondary | ICD-10-CM

## 2012-12-25 NOTE — Progress Notes (Signed)
Patient ID: MICHOL EMORY MRN: [id number], DOB: May 15, 1946, 66 y.o. Date of Encounter: 12/25/2012, 8:39 AM    Chief Complaint:  Chief Complaint  Patient presents with  . hit second toe left foot 1 week ago still very painful     HPI: 67 y.o. year old female states that she hit her left foot on a table leg 1 week ago. Concerned b/c she still has a lot of pain b/t 2nd and 3rd toes when she stands / is weight bearing. No pain at rest. C/o. No other      Home Meds: Current Outpatient Prescriptions on File Prior to Visit  Medication Sig Dispense Refill  . amLODipine (NORVASC) 5 MG tablet Take 5 mg by mouth daily.        Marland Kitchen aspirin EC 81 MG tablet Take 81 mg by mouth daily.        Marland Kitchen CINNAMON PO Take 1,000 mg by mouth 2 (two) times daily.        . Coenzyme Q10 (CO Q 10 PO) Take 100 mg by mouth daily.        . insulin aspart (NOVOLOG) 100 UNIT/ML injection Inject 10 Units into the skin 3 (three) times daily before meals.        . insulin glargine (LANTUS) 100 UNIT/ML injection Inject 30 Units into the skin at bedtime.       . metFORMIN (GLUMETZA) 1000 MG (MOD) 24 hr tablet Take 1,000 mg by mouth 2 (two) times daily with a meal.       . metoprolol (TOPROL-XL) 100 MG 24 hr tablet Take 100 mg by mouth daily.        . nitroGLYCERIN (NITROSTAT) 0.4 MG SL tablet Place 0.4 mg under the tongue every 5 (five) minutes as needed for chest pain.      . Pitavastatin Calcium (LIVALO) 4 MG TABS Take 1 tablet (4 mg total) by mouth daily.  30 tablet  5  . quinapril (ACCUPRIL) 40 MG tablet Take 40 mg by mouth 2 (two) times daily.       . traMADol (ULTRAM) 50 MG tablet as needed.       No current facility-administered medications on file prior to visit.    Allergies:  Allergies  Allergen Reactions  . Levaquin (Levofloxacin In D5w)     Pain all over and in joints  . Statins Other (See Comments)    Severe myalgias to lipitor, crestor, pravastaitin      Review of Systems: See HPI. All other ROS  negative.   Physical Exam: Blood pressure 152/88, pulse 56, temperature 97.3 F (36.3 C), temperature source Oral, resp. rate 18, weight 194 lb (87.998 kg)., Body mass index is 33.28 kg/(m^2). General: Well developed, well nourished,WF in no acute distress. Lungs: Clear bilaterally to auscultation without wheezes, rales, or rhonchi. Breathing is unlabored. Heart: Regular rhythm. No murmurs, rubs, or gallops. Msk:  Strength and tone normal for age. Extremities/Skin: Warm and dry. No clubbing or cyanosis. No edema. No rashes or suspicious lesions. Neuro: Alert and oriented X 3. Moves all extremities spontaneously. Gait is normal. CNII-XII grossly in tact. Psych:  Responds to questions appropriately with a normal affect. Left Foot: Inspection is normal: No ecchymosis. No edema. No open wound. Palpation reveals area of most tenderness is at "ball of foot" at bases of 2nd and 3rd toes and with palpation of the 2nd and 3rd toes.     ASSESSMENT AND PLAN:  67 y.o. year old female with  1. Left foot pain - DG Foot Complete Left; Future Will obtain XRay to r/o fracture. Will make further recommendations once obtain XRay report.  [address] [place], [place], [facility] 12/25/2012 8:39 AM

## 2013-02-02 ENCOUNTER — Other Ambulatory Visit: Payer: Self-pay | Admitting: Family Medicine

## 2013-02-02 ENCOUNTER — Telehealth: Payer: Self-pay | Admitting: Family Medicine

## 2013-02-05 NOTE — Telephone Encounter (Signed)
Completed.

## 2013-02-18 ENCOUNTER — Other Ambulatory Visit: Payer: Self-pay | Admitting: Family Medicine

## 2013-03-03 ENCOUNTER — Other Ambulatory Visit: Payer: Self-pay | Admitting: Family Medicine

## 2013-05-02 ENCOUNTER — Encounter: Payer: Self-pay | Admitting: Family Medicine

## 2013-05-02 ENCOUNTER — Ambulatory Visit (INDEPENDENT_AMBULATORY_CARE_PROVIDER_SITE_OTHER): Payer: Medicare Other | Admitting: Family Medicine

## 2013-05-02 VITALS — BP 110/80 | HR 80 | Resp 20 | Wt 197.0 lb

## 2013-05-02 DIAGNOSIS — J019 Acute sinusitis, unspecified: Secondary | ICD-10-CM

## 2013-05-02 MED ORDER — AMOXICILLIN-POT CLAVULANATE 875-125 MG PO TABS: 1.0000 | ORAL_TABLET | Freq: Two times a day (BID) | ORAL | Status: DC

## 2013-05-02 NOTE — Progress Notes (Signed)
Subjective:    Patient ID: Brenda Moreno, female    DOB: 08/25/1946, 67 y.o.   MRN: [id number]  HPI Patient has had sinus pressure and sinus pain in her right frontal sinus and right maxillary sinus for over a week. She cannot breathe out of her nose. She has constant postnasal drip. She has a sore scratchy throat. She has a nonproductive cough. Her teeth are beginning to hurt. She denies any nausea vomiting diarrhea. Past Medical History  Diagnosis Date  . Leg pain   . Joint pain   . Carotid artery occlusion   . CAD (coronary artery disease)     S/P cabg  . Peripheral vascular disease   . Arthritis   . CHF (congestive heart failure)   . Diabetes mellitus   . Hyperlipidemia   . Hypertension   . Myocardial infarction   . PVD (peripheral vascular disease)   .psh Current Outpatient Prescriptions on File Prior to Visit  Medication Sig Dispense Refill  . amLODipine (NORVASC) 5 MG tablet Take 5 mg by mouth daily.        Marland Kitchen aspirin EC 81 MG tablet Take 81 mg by mouth daily.        Marland Kitchen CINNAMON PO Take 1,000 mg by mouth 2 (two) times daily.        . Coenzyme Q10 (CO Q 10 PO) Take 100 mg by mouth daily.        . insulin aspart (NOVOLOG) 100 UNIT/ML injection Inject 10 Units into the skin 3 (three) times daily before meals.        . insulin glargine (LANTUS) 100 UNIT/ML injection Inject 30 Units into the skin at bedtime.       . metFORMIN (GLUMETZA) 1000 MG (MOD) 24 hr tablet Take 1,000 mg by mouth 2 (two) times daily with a meal.       . metoprolol succinate (TOPROL-XL) 100 MG 24 hr tablet TAKE 1 TABLET BY MOUTH EVERY DAY  30 tablet  11  . nitroGLYCERIN (NITROSTAT) 0.4 MG SL tablet Place 0.4 mg under the tongue every 5 (five) minutes as needed for chest pain.      . Pitavastatin Calcium (LIVALO) 4 MG TABS Take 1 tablet (4 mg total) by mouth daily.  30 tablet  5  . quinapril (ACCUPRIL) 40 MG tablet TAKE 1 TABLET BY MOUTH TWICE A DAY  180 tablet  1  . traMADol (ULTRAM) 50 MG tablet as needed.       . simvastatin (ZOCOR) 10 MG tablet TAKE 1 TABLET BY MOUTH AT BEDTIME  30 tablet  2   No current facility-administered medications on file prior to visit.   Allergies  Allergen Reactions  . Levaquin [Levofloxacin In D5w]     Pain all over and in joints  . Statins Other (See Comments)    Severe myalgias to lipitor, crestor, pravastaitin   History   Social History  . Marital Status: Divorced    Spouse Name: N/A    Number of Children: N/A  . Years of Education: N/A   Occupational History  . Not on file.   Social History Main Topics  . Smoking status: Former Smoker -- 0.25 packs/day    Types: Cigarettes    Quit date: 09/13/2011  . Smokeless tobacco: Never Used  . Alcohol Use: No  . Drug Use: No  . Sexual Activity:    Other Topics Concern  . Not on file   Social History Narrative  . No narrative on  file      Review of Systems  All other systems reviewed and are negative.       Objective:   Physical Exam  Vitals reviewed. Constitutional: She appears well-developed and well-nourished. No distress.  HENT:  Right Ear: External ear normal.  Left Ear: External ear normal.  Nose: Mucosal edema and rhinorrhea present. Right sinus exhibits maxillary sinus tenderness and frontal sinus tenderness. Left sinus exhibits no maxillary sinus tenderness and no frontal sinus tenderness.  Mouth/Throat: Oropharynx is clear and moist.  Eyes: Conjunctivae and EOM are normal. Pupils are equal, round, and reactive to light. Right eye exhibits no discharge. Left eye exhibits no discharge. No scleral icterus.  Neck: Normal range of motion. Neck supple. No JVD present. No thyromegaly present.  Cardiovascular: Normal rate, regular rhythm and normal heart sounds.   No murmur heard. Pulmonary/Chest: Effort normal and breath sounds normal. No respiratory distress. She has no wheezes. She has no rales.  Lymphadenopathy:    She has no cervical adenopathy.  Skin: She is not diaphoretic.           Assessment & Plan:  1. Acute rhinosinusitis Begin Augmentin 875 mg one by mouth twice a day for 10 days. Recheck in 1 week if no better or sooner if worse. - amoxicillin-clavulanate (AUGMENTIN) 875-125 MG per tablet; Take 1 tablet by mouth 2 (two) times daily.  Dispense: 20 tablet; Refill: 0

## 2013-06-04 ENCOUNTER — Ambulatory Visit (INDEPENDENT_AMBULATORY_CARE_PROVIDER_SITE_OTHER): Payer: Medicare Other | Admitting: Family Medicine

## 2013-06-04 DIAGNOSIS — Z23 Encounter for immunization: Secondary | ICD-10-CM

## 2013-06-17 ENCOUNTER — Encounter: Payer: Self-pay | Admitting: Family Medicine

## 2013-06-17 ENCOUNTER — Other Ambulatory Visit: Payer: Self-pay | Admitting: Family Medicine

## 2013-06-17 NOTE — Telephone Encounter (Signed)
Medication refill for one time only.  Patient needs to be seen.  Letter sent for patient to call and schedule 

## 2013-07-01 ENCOUNTER — Other Ambulatory Visit: Payer: Self-pay | Admitting: Family Medicine

## 2013-07-01 MED ORDER — METOPROLOL SUCCINATE ER 100 MG PO TB24: ORAL_TABLET | ORAL | Status: DC

## 2013-07-01 MED ORDER — PITAVASTATIN CALCIUM 4 MG PO TABS: 4.0000 mg | ORAL_TABLET | Freq: Every day | ORAL | Status: DC

## 2013-07-01 NOTE — Telephone Encounter (Signed)
Rx Refilled  

## 2013-07-06 ENCOUNTER — Other Ambulatory Visit: Payer: Self-pay | Admitting: Family Medicine

## 2013-07-09 ENCOUNTER — Other Ambulatory Visit: Payer: Self-pay | Admitting: Family Medicine

## 2013-07-12 ENCOUNTER — Other Ambulatory Visit: Payer: Self-pay | Admitting: Family Medicine

## 2013-07-12 ENCOUNTER — Ambulatory Visit (INDEPENDENT_AMBULATORY_CARE_PROVIDER_SITE_OTHER): Payer: Medicare Other | Admitting: Family Medicine

## 2013-07-12 VITALS — BP 130/78 | HR 80 | Temp 98.4°F | Resp 18 | Ht 63.0 in | Wt 193.0 lb

## 2013-07-12 DIAGNOSIS — J019 Acute sinusitis, unspecified: Secondary | ICD-10-CM

## 2013-07-12 DIAGNOSIS — M47816 Spondylosis without myelopathy or radiculopathy, lumbar region: Secondary | ICD-10-CM

## 2013-07-12 DIAGNOSIS — R300 Dysuria: Secondary | ICD-10-CM

## 2013-07-12 DIAGNOSIS — M47817 Spondylosis without myelopathy or radiculopathy, lumbosacral region: Secondary | ICD-10-CM

## 2013-07-12 LAB — URINALYSIS, MICROSCOPIC ONLY: Casts: NONE SEEN

## 2013-07-12 LAB — URINALYSIS, ROUTINE W REFLEX MICROSCOPIC
Glucose, UA: NEGATIVE mg/dL
Hgb urine dipstick: NEGATIVE
Ketones, ur: NEGATIVE mg/dL
Nitrite: NEGATIVE
Protein, ur: 100 mg/dL — AB
pH: 5.5 (ref 5.0–8.0)

## 2013-07-12 MED ORDER — FLUTICASONE PROPIONATE 50 MCG/ACT NA SUSP: 2.0000 | Freq: Every day | NASAL | Status: DC

## 2013-07-12 MED ORDER — TRAMADOL HCL 50 MG PO TABS: 50.0000 mg | ORAL_TABLET | Freq: Four times a day (QID) | ORAL | Status: DC | PRN

## 2013-07-12 MED ORDER — AMOXICILLIN-POT CLAVULANATE 875-125 MG PO TABS: 1.0000 | ORAL_TABLET | Freq: Two times a day (BID) | ORAL | Status: DC

## 2013-07-12 NOTE — Patient Instructions (Signed)
Use the nasal spray and antibiotics Take the ultram Call if you want referral to neurosurgery F/U as previous with Dr. Tanya Nones

## 2013-07-14 ENCOUNTER — Encounter: Payer: Self-pay | Admitting: Family Medicine

## 2013-07-14 DIAGNOSIS — J019 Acute sinusitis, unspecified: Secondary | ICD-10-CM | POA: Insufficient documentation

## 2013-07-14 DIAGNOSIS — M47816 Spondylosis without myelopathy or radiculopathy, lumbar region: Secondary | ICD-10-CM | POA: Insufficient documentation

## 2013-07-14 NOTE — Assessment & Plan Note (Signed)
No sign of true UTI I think her pain is due to worsening DJD with impingement, she declines MRI or any intervention, Her weakness has been worsening on left side, she has seen ortho and has had Physical therapy with no improvement. Ultram refilled at request Asked she reconsider as she will start to lose function on the left side and increase risk of falls

## 2013-07-14 NOTE — Progress Notes (Signed)
  Subjective:    Patient ID: Brenda Moreno, female    DOB: 05-Nov-1945, 67 y.o.   MRN: [id number]  HPI  Pt here with sinusitis and back pain.  Sinusitis- sinus pressure and drainage past 2 weeks, worse in morning and at night. Using a nasal spray which helps some. Denies fever or cough, has some post nasal drip Back pain- felt she may have kidney infection, has mild pressure with urination none currently, pain mostly on left lower back, has some radiation into her thigh, denies painful urination or blood in urine. No change in bowels. Has known DJD, Disc bulge with left sided nerve root impingement.. She has weakness on left side compared to right, has been seen by ortho in the past but does not want surgical intervention, Also does not want to take chronic medications so uses ultram very sparingly for pain.   Review of Systems  GEN- denies fatigue, fever, weight loss,weakness, recent illness HEENT- denies eye drainage, change in vision, +nasal discharge, CVS- denies chest pain, palpitations RESP- denies SOB, cough, wheeze ABD- denies N/V, change in stools, abd pain GU- denies dysuria, hematuria, dribbling, incontinence MSK- + joint pain, muscle aches, injury Neuro- denies headache, dizziness, syncope, seizure activity      Objective:   Physical Exam GEN- NAD, alert and oriented x3 HEENT- PERRL, EOMI, non injected sclera, pink conjunctiva, MMM, oropharynx mild injection, TM clear bilat no effusion, + maxillary sinus tenderness, inflammed turbinates,  Nasal drainage  Neck- Supple, no LAD CVS- RRR, no murmur RESP-CTAB AND-NABS,soft, NT,ND, no suprapubic tenderness, no CVA tenderness MSK- Back- Spine NT, TTP left paraspinals, +SLR left side Neuro- Decreased strength LLE compared to Right, DTR symmetric, normal tone EXT- No edema Pulses- Radial 2+         Assessment & Plan:

## 2013-07-14 NOTE — Assessment & Plan Note (Signed)
Augmentin, flonase

## 2013-07-27 ENCOUNTER — Other Ambulatory Visit: Payer: Self-pay | Admitting: Family Medicine

## 2013-07-30 ENCOUNTER — Encounter: Payer: Self-pay | Admitting: Family Medicine

## 2013-08-01 ENCOUNTER — Other Ambulatory Visit: Payer: Self-pay | Admitting: Family Medicine

## 2013-08-14 ENCOUNTER — Other Ambulatory Visit: Payer: Self-pay | Admitting: Family Medicine

## 2013-08-14 MED ORDER — AMLODIPINE BESYLATE 5 MG PO TABS: 5.0000 mg | ORAL_TABLET | Freq: Every day | ORAL | Status: DC

## 2013-08-14 NOTE — Telephone Encounter (Signed)
Rx Refilled  

## 2013-08-21 ENCOUNTER — Ambulatory Visit: Payer: Self-pay | Admitting: Family Medicine

## 2013-09-06 ENCOUNTER — Encounter: Payer: Self-pay | Admitting: Family Medicine

## 2013-09-06 ENCOUNTER — Ambulatory Visit (INDEPENDENT_AMBULATORY_CARE_PROVIDER_SITE_OTHER): Payer: Medicare HMO | Admitting: Family Medicine

## 2013-09-06 VITALS — BP 130/70 | HR 68 | Temp 98.1°F | Resp 18 | Ht 65.0 in | Wt 196.0 lb

## 2013-09-06 DIAGNOSIS — J019 Acute sinusitis, unspecified: Secondary | ICD-10-CM

## 2013-09-06 DIAGNOSIS — R6883 Chills (without fever): Secondary | ICD-10-CM

## 2013-09-06 DIAGNOSIS — R52 Pain, unspecified: Secondary | ICD-10-CM

## 2013-09-06 LAB — INFLUENZA A AND B
INFLUENZA A AG: NEGATIVE
INFLUENZA B AG: NEGATIVE

## 2013-09-06 MED ORDER — AMOXICILLIN-POT CLAVULANATE 875-125 MG PO TABS: 1.0000 | ORAL_TABLET | Freq: Two times a day (BID) | ORAL | Status: DC

## 2013-09-06 NOTE — Patient Instructions (Signed)
Start the antibiotics  Use netty pot or nasal saline Continue flonase Mucinex DM Call if not better

## 2013-09-06 NOTE — Assessment & Plan Note (Signed)
Patient was sinusitis infection status post viral illness/flu. I will treat her with Augmentin she will also continue Flonase advise her to try Netty to see if this can open her up. She is to avoid decongestants do to her cardiac history

## 2013-09-06 NOTE — Progress Notes (Signed)
   Subjective:    Patient ID: Brenda Moreno, female    DOB: February 13, 1946, 68 y.o.   MRN: [id number]  HPI Patient here with sinus drainage cough headache for the past 2 weeks. She was actually treated for influenza A. about 3 weeks ago with a positive testing at an urgent care she did complete the Tamiflu. She states that she minimally improved after completing the medication but now feels like everything is in her head in her sinuses are draining into her chest. She's had subjective fever and chills. She has been using her Flonase which helps some as well as some Delsym for the cough. She is more pressure and pain on the left side of her face compared to the right.   Review of Systems  GEN- denies fatigue, fever, weight loss,weakness, recent illness HEENT- denies eye drainage, change in vision,+ nasal discharge, CVS- denies chest pain, palpitations RESP- denies SOB, +cough, wheeze ABD- denies N/V, change in stools, abd pain Neuro- denies headache, dizziness, syncope, seizure activity      Objective:   Physical Exam GEN- NAD, alert and oriented x3, non toxic appearing HEENT- PERRL, EOMI, non injected sclera, pink conjunctiva, MMM, oropharynx clear, TM clear bilat no effusion,  + maxillary sinus tenderness, inflammed turbinates,  Nasal drainage  Neck- Supple, no LAD CVS- RRR, no murmur RESP-CTAB EXT- No edema Pulses- Radial 2+   Flu Negative       Assessment & Plan:

## 2013-09-20 ENCOUNTER — Other Ambulatory Visit: Payer: Self-pay | Admitting: Family Medicine

## 2013-09-25 ENCOUNTER — Encounter: Payer: Self-pay | Admitting: Family

## 2013-09-26 ENCOUNTER — Other Ambulatory Visit: Payer: Medicare Other

## 2013-09-26 ENCOUNTER — Ambulatory Visit (INDEPENDENT_AMBULATORY_CARE_PROVIDER_SITE_OTHER): Payer: Medicare HMO | Admitting: Family

## 2013-09-26 ENCOUNTER — Encounter: Payer: Self-pay | Admitting: Family

## 2013-09-26 ENCOUNTER — Ambulatory Visit: Payer: Medicare Other | Admitting: Neurosurgery

## 2013-09-26 ENCOUNTER — Ambulatory Visit (INDEPENDENT_AMBULATORY_CARE_PROVIDER_SITE_OTHER)
Admission: RE | Admit: 2013-09-26 | Discharge: 2013-09-26 | Disposition: A | Discharge status: Home / Self Care | Payer: Medicare HMO | Source: Ambulatory Visit | Attending: Neurosurgery | Admitting: Neurosurgery

## 2013-09-26 ENCOUNTER — Ambulatory Visit (HOSPITAL_COMMUNITY)
Admission: RE | Admit: 2013-09-26 | Discharge: 2013-09-26 | Disposition: A | Discharge status: Home / Self Care | Payer: Medicare HMO | Source: Ambulatory Visit | Attending: Family | Admitting: Family

## 2013-09-26 VITALS — BP 156/78 | HR 52 | Resp 14 | Ht 65.0 in | Wt 192.0 lb

## 2013-09-26 DIAGNOSIS — Z48812 Encounter for surgical aftercare following surgery on the circulatory system: Secondary | ICD-10-CM

## 2013-09-26 DIAGNOSIS — Z9889 Other specified postprocedural states: Secondary | ICD-10-CM | POA: Insufficient documentation

## 2013-09-26 DIAGNOSIS — I739 Peripheral vascular disease, unspecified: Secondary | ICD-10-CM | POA: Insufficient documentation

## 2013-09-26 NOTE — Progress Notes (Signed)
[facility] [place] HISTORY AND PHYSICAL -PAD  History of Present Illness Brenda Moreno is a 68 y.o. female patient of Dr. Oneida Alar status post a right CIA stent in 2007. She returns today for LE arterial perfusion evaluation. She had an MI in 1998, then 6 vessel CABG, states she has not walked much for exercise in the last year due to her sister's terminal illness, prior to this walked an hour plus daily. Has moderate claudication in both calves after walking about 20 minutes, relieved by short rest. Pt denies non-healing wounds.  Pt reports New Medical or Surgical History: had the flu recently.  Pt Diabetic: Yes, states in [place] control, states her last A1C was 6.? Pt smoker: former smoker, quit Nov., 2014  Pt meds include: Statin :Yes, several statins caused legs weakness, is tolerating the current statin Betablocker: Yes ASA: Yes Other anticoagulants/antiplatelets: no  Past Medical History  Diagnosis Date  . Leg pain   . Joint pain   . Carotid artery occlusion   . CAD (coronary artery disease)     S/P cabg  . Peripheral vascular disease   . Arthritis   . CHF (congestive heart failure)   . Diabetes mellitus   . Hyperlipidemia   . Hypertension   . Myocardial infarction   . PVD (peripheral vascular disease)     Social History History  Substance Use Topics  . Smoking status: Former Smoker -- 0.25 packs/day    Types: Cigarettes    Quit date: 09/13/2011  . Smokeless tobacco: Never Used  . Alcohol Use: No    Family History Family History  Problem Relation Age of Onset  . Heart disease Mother   . Diabetes Mother   . Hyperlipidemia Mother   . Hypertension Mother   . Heart disease Father   . Diabetes Father   . Hyperlipidemia Father   . Hypertension Father   . Heart disease Sister     heart attack  . Diabetes Sister   . Hypertension Sister   . Other Sister     history of amputation  . Coronary artery disease Other   . Diabetes Brother   .  Hypertension Brother     Past Surgical History  Procedure Laterality Date  . Coronary artery bypass graft      1998  . Appendectomy    . Tubal ligation    . Cholecystectomy      Gall bladder  . Pr vein bypass graft,aorto-fem-pop      Allergies  Allergen Reactions  . Levaquin [Levofloxacin In D5w]     Pain all over and in joints  . Statins Other (See Comments)    Severe myalgias to lipitor, crestor, pravastaitin    Current Outpatient Prescriptions  Medication Sig Dispense Refill  . amLODipine (NORVASC) 5 MG tablet Take 1 tablet (5 mg total) by mouth daily.  90 tablet  0  . amoxicillin-clavulanate (AUGMENTIN) 875-125 MG per tablet Take 1 tablet by mouth 2 (two) times daily.  20 tablet  0  . aspirin EC 81 MG tablet Take 81 mg by mouth daily.        . B-D INS SYR ULTRAFINE 1CC/31G 31G X 5/16" 1 ML MISC USE AS DIRECTED  100 each  6  . B-D ULTRAFINE III SHORT PEN 31G X 8 MM MISC USE 4 TIMES A DAY  100 each  6  . CINNAMON PO Take 1,000 mg by mouth 2 (two) times daily.        Marland Kitchen  Coenzyme Q10 (CO Q 10 PO) Take 100 mg by mouth daily.        . fluticasone (FLONASE) 50 MCG/ACT nasal spray Place 2 sprays into both nostrils daily.  16 g  2  . insulin aspart (NOVOLOG) 100 UNIT/ML injection Inject 10 Units into the skin once.       . insulin glargine (LANTUS) 100 UNIT/ML injection Inject 30 Units into the skin at bedtime.       . metFORMIN (GLUCOPHAGE) 1000 MG tablet TAKE 1 TABLETBY MOUTH TWICE A DAY WITH MEALS  60 tablet  5  . metFORMIN (GLUMETZA) 1000 MG (MOD) 24 hr tablet Take 1,000 mg by mouth 2 (two) times daily with a meal.       . metoprolol succinate (TOPROL-XL) 100 MG 24 hr tablet TAKE 1 TABLET BY MOUTH EVERY DAY  90 tablet  2  . nitroGLYCERIN (NITROSTAT) 0.4 MG SL tablet Place 0.4 mg under the tongue every 5 (five) minutes as needed for chest pain.      . Pitavastatin Calcium (LIVALO) 4 MG TABS Take 1 tablet (4 mg total) by mouth daily.  90 tablet  2  . quinapril (ACCUPRIL) 40 MG  tablet TAKE 1 TABLET BY MOUTH TWICE A DAY  180 tablet  1  . simvastatin (ZOCOR) 10 MG tablet TAKE 1 TABLET BY MOUTH AT BEDTIME  30 tablet  2  . traMADol (ULTRAM) 50 MG tablet Take 1 tablet (50 mg total) by mouth every 6 (six) hours as needed.  60 tablet  1   No current facility-administered medications for this visit.    ROS: See HPI for pertinent positives and negatives.   Physical Examination  Filed Vitals:   09/26/13 1036  BP: 156/78  Pulse: 52  Resp: 14   Filed Weights   09/26/13 1036  Weight: 192 lb (87.091 kg)   Body mass index is 31.95 kg/(m^2).  General: A&O x 3, WDWN, obese female. Gait: normal Eyes: Pupils equal Pulmonary: CTAB, without wheezes , rales or rhonchi Cardiac: regular Rythm , without detected murmur          Carotid Bruits Left Right   Negative Negative  Aorta: is not palpable Radial pulses: are palpable                           VASCULAR EXAM: Extremities without ischemic changes  without Gangrene; without open wounds.                                                                                                          LE Pulses LEFT RIGHT       FEMORAL  not palpable   palpable        POPLITEAL  not palpable   not palpable       POSTERIOR TIBIAL  not palpable    palpable        DORSALIS PEDIS      ANTERIOR TIBIAL  palpable   palpable    Abdomen: soft, NT, no masses. Skin:  no rashes, no ulcers noted. Musculoskeletal: no muscle wasting or atrophy.  Neurologic: A&O X 3; Appropriate Affect ; SENSATION: normal; MOTOR FUNCTION:  moving all extremities equally, motor strength 5/5 throughout. Speech is fluent/normal. CN 2-12 intact.   Non-Invasive Vascular Imaging: DATE: 09/26/2013 ABI: RIGHT 1.02, Waveforms: triphasic;  LEFT 0.97, Waveforms: triphasic Previous (09/27/12) ABI's: Right: 0.98, Left: 1.01  DUPLEX SCAN OF BYPASS:  AORTO - ILIAC DUPLEX EVALUATION    INDICATION: Iliac stent    PREVIOUS INTERVENTION(S): Right common iliac  artery stent in 2007    DUPLEX EXAM:      Peak Systolic Velocity (cm/s)  AORTA - Proximal 68  AORTA - Mid Not Visualized   AORTA - Distal 96    RIGHT  LEFT  Peak Systolic Velocity (cm/s) Ratio (if abnormal) Waveform  Peak Systolic Velocity (cm/s) Ratio (if abnormal) Waveform  Not Visualized    Common Iliac Artery - Proximal     Not Visualized    Common Iliac Artery - Mid     Not Visualized    Common Iliac Artery - Distal     148  B External Iliac Artery - Proximal     136  B External Iliac Artery - Mid     116  B External Iliac Artery - Distal     Not Visualized    Internal Iliac Artery     1.02 Today's ABI / TBI 0.97  0.98 Previous ABI / TBI (09/27/12 ) 1.01    Waveform:    M - Monophasic       B - Biphasic       T - Triphasic  If Ankle Brachial Index (ABI) or Toe Brachial Index (TBI) performed, please see complete report     ADDITIONAL FINDINGS: Decreased visualization of the abdominal vasculature and iliac stent due to overlying bowel gas and patient body habitus.     IMPRESSION: 1. The right common iliac artery stent was not adequately visualized however the right external iliac artery suggests stent patency. 2. No hemodynamically significant stenosis noted in the abdominal aorta or right external iliac artery, based on limited visualization. 3. Comparison to the previous exam is noted on the second page of this report.     Compared to the previous exam:  Unable to adequately compare to the previous exam on 09/27/12 due to the right common iliac artery not being visualized.        ASSESSMENT: Brenda Moreno is a 68 y.o. female who presents status post a right CIA stent in 2007 with mild claudication symptoms in calves.  However, her ABI's are normal and her pedal pulses are palpable. Due to body habitus and bowel gas, the right common iliac artery with stent was not visualized. Her atherosclerotic risk factors are DM, which is in good control, dyslipidemia, for which she  takes a statin, hypertension under medical management, obesity, and lack of exercise. Fortunately she no longer smokes and does not use ETOH.  I encouraged her to start a graduated walking program.  PLAN:  I discussed in depth with the patient the nature of atherosclerosis, and emphasized the importance of maximal medical management including strict control of blood pressure, blood glucose, and lipid levels, obtaining regular exercise, and cessation of smoking.  The patient is aware that without maximal medical management the underlying atherosclerotic disease process will progress, limiting the benefit of any interventions. Based on the patient's vascular studies and examination, pt will return to clinic in 1 year for aorto  iliac Duplex and ABI's.  The patient was given information about PAD including signs, symptoms, treatment, what symptoms should prompt the patient to seek immediate medical care, and risk reduction measures to take.  Clemon Chambers, RN, MSN, FNP-C [facility] [employer] [facility] Phone: [phone]  Clinic MD: [facility]  09/26/2013 9:44 AM

## 2013-09-26 NOTE — Patient Instructions (Signed)
Peripheral Vascular Disease Peripheral Vascular Disease (PVD), also called Peripheral Arterial Disease (PAD), is a circulation problem caused by cholesterol (atherosclerotic plaque) deposits in the arteries. PVD commonly occurs in the lower extremities (legs) but it can occur in other areas of the body, such as your arms. The cholesterol buildup in the arteries reduces blood flow which can cause pain and other serious problems. The presence of PVD can place a person at risk for Coronary Artery Disease (CAD).  CAUSES  Causes of PVD can be many. It is usually associated with more than one risk factor such as:   High Cholesterol.  Smoking.  Diabetes.  Lack of exercise or inactivity.  High blood pressure (hypertension).  Obesity.  Family history. SYMPTOMS   When the lower extremities are affected, patients with PVD may experience:  Leg pain with exertion or physical activity. This is called INTERMITTENT CLAUDICATION. This may present as cramping or numbness with physical activity. The location of the pain is associated with the level of blockage. For example, blockage at the abdominal level (distal abdominal aorta) may result in buttock or hip pain. Lower leg arterial blockage may result in calf pain.  As PVD becomes more severe, pain can develop with less physical activity.  In people with severe PVD, leg pain may occur at rest.  Other PVD signs and symptoms:  Leg numbness or weakness.  Coldness in the affected leg or foot, especially when compared to the other leg.  A change in leg color.  Patients with significant PVD are more prone to ulcers or sores on toes, feet or legs. These may take longer to heal or may reoccur. The ulcers or sores can become infected.  If signs and symptoms of PVD are ignored, gangrene may occur. This can result in the loss of toes or loss of an entire limb.  Not all leg pain is related to PVD. Other medical conditions can cause leg pain such  as:  Blood clots (embolism) or Deep Vein Thrombosis.  Inflammation of the blood vessels (vasculitis).  Spinal stenosis. DIAGNOSIS  Diagnosis of PVD can involve several different types of tests. These can include:  Pulse Volume Recording Method (PVR). This test is simple, painless and does not involve the use of X-rays. PVR involves measuring and comparing the blood pressure in the arms and legs. An ABI (Ankle-Brachial Index) is calculated. The normal ratio of blood pressures is 1. As this number becomes smaller, it indicates more severe disease.  < 0.95  indicates significant narrowing in one or more leg vessels.  <0.8 there will usually be pain in the foot, leg or buttock with exercise.  <0.4 will usually have pain in the legs at rest.  <0.25  usually indicates limb threatening PVD.  Doppler detection of pulses in the legs. This test is painless and checks to see if you have a pulses in your legs/feet.  A dye or contrast material (a substance that highlights the blood vessels so they show up on x-ray) may be given to help your caregiver better see the arteries for the following tests. The dye is eliminated from your body by the kidney's. Your caregiver may order blood work to check your kidney function and other laboratory values before the following tests are performed:  Magnetic Resonance Angiography (MRA). An MRA is a picture study of the blood vessels and arteries. The MRA machine uses a large magnet to produce images of the blood vessels.  Computed Tomography Angiography (CTA). A CTA is a   specialized x-ray that looks at how the blood flows in your blood vessels. An IV may be inserted into your arm so contrast dye can be injected.  Angiogram. Is a procedure that uses x-rays to look at your blood vessels. This procedure is minimally invasive, meaning a small incision (cut) is made in your groin. A small tube (catheter) is then inserted into the artery of your groin. The catheter is  guided to the blood vessel or artery your caregiver wants to examine. Contrast dye is injected into the catheter. X-rays are then taken of the blood vessel or artery. After the images are obtained, the catheter is taken out. TREATMENT  Treatment of PVD involves many interventions which may include:  Lifestyle changes:  Quitting smoking.  Exercise.  Following a low fat, low cholesterol diet.  Control of diabetes.  Foot care is very important to the PVD patient. Good foot care can help prevent infection.  Medication:  Cholesterol-lowering medicine.  Blood pressure medicine.  Anti-platelet drugs.  Certain medicines may reduce symptoms of Intermittent Claudication.  Interventional/Surgical options:  Angioplasty. An Angioplasty is a procedure that inflates a balloon in the blocked artery. This opens the blocked artery to improve blood flow.  Stent Implant. A wire mesh tube (stent) is placed in the artery. The stent expands and stays in place, allowing the artery to remain open.  Peripheral Bypass Surgery. This is a surgical procedure that reroutes the blood around a blocked artery to help improve blood flow. This type of procedure may be performed if Angioplasty or stent implants are not an option. SEEK IMMEDIATE MEDICAL CARE IF:   You develop pain or numbness in your arms or legs.  Your arm or leg turns cold, becomes blue in color.  You develop redness, warmth, swelling and pain in your arms or legs. MAKE SURE YOU:   Understand these instructions.  Will watch your condition.  Will get help right away if you are not doing well or get worse. Document Released: 09/22/2004 Document Revised: 11/07/2011 Document Reviewed: 08/19/2008 Exit[employer] Patient Information 2014 [employer].  

## 2013-10-09 ENCOUNTER — Other Ambulatory Visit: Payer: Self-pay

## 2013-10-09 DIAGNOSIS — Z1231 Encounter for screening mammogram for malignant neoplasm of breast: Secondary | ICD-10-CM

## 2013-10-10 ENCOUNTER — Ambulatory Visit
Admission: RE | Admit: 2013-10-10 | Discharge: 2013-10-10 | Disposition: A | Discharge status: Home / Self Care | Payer: Medicare HMO | Source: Ambulatory Visit

## 2013-10-10 DIAGNOSIS — Z1231 Encounter for screening mammogram for malignant neoplasm of breast: Secondary | ICD-10-CM

## 2013-10-16 ENCOUNTER — Telehealth: Payer: Self-pay | Admitting: Family Medicine

## 2013-10-16 NOTE — Telephone Encounter (Signed)
PA sent through [email or web address].

## 2013-10-17 ENCOUNTER — Other Ambulatory Visit: Payer: Medicare HMO

## 2013-10-17 DIAGNOSIS — E119 Type 2 diabetes mellitus without complications: Secondary | ICD-10-CM

## 2013-10-17 DIAGNOSIS — I2510 Atherosclerotic heart disease of native coronary artery without angina pectoris: Secondary | ICD-10-CM

## 2013-10-17 DIAGNOSIS — E785 Hyperlipidemia, unspecified: Secondary | ICD-10-CM

## 2013-10-17 DIAGNOSIS — Z79899 Other long term (current) drug therapy: Secondary | ICD-10-CM

## 2013-10-17 LAB — COMPREHENSIVE METABOLIC PANEL
ALBUMIN: 4.1 g/dL (ref 3.5–5.2)
ALT: 13 U/L (ref 0–35)
AST: 15 U/L (ref 0–37)
Alkaline Phosphatase: 81 U/L (ref 39–117)
BUN: 16 mg/dL (ref 6–23)
CALCIUM: 9.5 mg/dL (ref 8.4–10.5)
CHLORIDE: 106 meq/L (ref 96–112)
CO2: 27 mEq/L (ref 19–32)
CREATININE: 1 mg/dL (ref 0.50–1.10)
GLUCOSE: 85 mg/dL (ref 70–99)
Potassium: 4.4 mEq/L (ref 3.5–5.3)
Sodium: 143 mEq/L (ref 135–145)
Total Bilirubin: 0.4 mg/dL (ref 0.2–1.2)
Total Protein: 6.4 g/dL (ref 6.0–8.3)

## 2013-10-17 LAB — LIPID PANEL
CHOLESTEROL: 172 mg/dL (ref 0–200)
HDL: 38 mg/dL — ABNORMAL LOW (ref >39)
LDL Cholesterol: 95 mg/dL (ref 0–99)
TRIGLYCERIDES: 195 mg/dL — AB (ref <150)
Total CHOL/HDL Ratio: 4.5 Ratio
VLDL: 39 mg/dL (ref 0–40)

## 2013-10-17 LAB — CBC WITH DIFFERENTIAL/PLATELET
BASOS ABS: 0.1 10*3/uL (ref 0.0–0.1)
Basophils Relative: 1 % (ref 0–1)
EOS PCT: 3 % (ref 0–5)
Eosinophils Absolute: 0.2 10*3/uL (ref 0.0–0.7)
HEMATOCRIT: 41.1 % (ref 36.0–46.0)
HEMOGLOBIN: 13.7 g/dL (ref 12.0–15.0)
Lymphocytes Relative: 36 % (ref 12–46)
Lymphs Abs: 2.5 10*3/uL (ref 0.7–4.0)
MCH: 26.4 pg (ref 26.0–34.0)
MCHC: 33.3 g/dL (ref 30.0–36.0)
MCV: 79.2 fL (ref 78.0–100.0)
MONO ABS: 0.6 10*3/uL (ref 0.1–1.0)
MONOS PCT: 8 % (ref 3–12)
Neutro Abs: 3.6 10*3/uL (ref 1.7–7.7)
Neutrophils Relative %: 52 % (ref 43–77)
Platelets: 270 10*3/uL (ref 150–400)
RBC: 5.19 MIL/uL — ABNORMAL HIGH (ref 3.87–5.11)
RDW: 15.6 % — ABNORMAL HIGH (ref 11.5–15.5)
WBC: 7 10*3/uL (ref 4.0–10.5)

## 2013-10-17 LAB — HEMOGLOBIN A1C
Hgb A1c MFr Bld: 8.6 % — ABNORMAL HIGH (ref <5.7)
Mean Plasma Glucose: 200 mg/dL — ABNORMAL HIGH (ref <117)

## 2013-10-17 NOTE — Telephone Encounter (Signed)
Approved through 08/28/14 - Faxed to pharm

## 2013-10-21 ENCOUNTER — Encounter: Payer: Self-pay | Admitting: Family Medicine

## 2013-10-21 ENCOUNTER — Ambulatory Visit (INDEPENDENT_AMBULATORY_CARE_PROVIDER_SITE_OTHER): Payer: Medicare HMO | Admitting: Family Medicine

## 2013-10-21 VITALS — BP 126/64 | HR 60 | Temp 98.5°F | Resp 16 | Ht 64.0 in | Wt 192.0 lb

## 2013-10-21 DIAGNOSIS — Z0000 Encounter for general adult medical examination without abnormal findings: Secondary | ICD-10-CM

## 2013-10-21 DIAGNOSIS — Z23 Encounter for immunization: Secondary | ICD-10-CM

## 2013-10-21 NOTE — Progress Notes (Signed)
Subjective:    Patient ID: Brenda Moreno, female    DOB: 29-Jul-1946, 68 y.o.   MRN: [id number]  HPI  Patient is here today for complete physical exam. She had a Zostavax in 2012. She had Pneumovax 23. She had a flu shot. She is due for Prevnar 13. She had her mammogram in February of this year and was normal. Her colonoscopy was performed 5 years ago and is up to date per her report. She is due for a Pap smear today. Her most recent labwork as listed below. Her blood work is excellent except for hemoglobin A1c is elevated at 8.6. The patient readily admits that she is forgetting to take her NovoLog at lunch. Lab on 10/17/2013  Component Date Value Ref Range Status  . WBC 10/17/2013 7.0  4.0 - 10.5 K/uL Final  . RBC 10/17/2013 5.19* 3.87 - 5.11 MIL/uL Final  . Hemoglobin 10/17/2013 13.7  12.0 - 15.0 g/dL Final  . HCT 10/17/2013 41.1  36.0 - 46.0 % Final  . MCV 10/17/2013 79.2  78.0 - 100.0 fL Final  . MCH 10/17/2013 26.4  26.0 - 34.0 pg Final  . MCHC 10/17/2013 33.3  30.0 - 36.0 g/dL Final  . RDW 10/17/2013 15.6* 11.5 - 15.5 % Final  . Platelets 10/17/2013 270  150 - 400 K/uL Final  . Neutrophils Relative % 10/17/2013 52  43 - 77 % Final  . Neutro Abs 10/17/2013 3.6  1.7 - 7.7 K/uL Final  . Lymphocytes Relative 10/17/2013 36  12 - 46 % Final  . Lymphs Abs 10/17/2013 2.5  0.7 - 4.0 K/uL Final  . Monocytes Relative 10/17/2013 8  3 - 12 % Final  . Monocytes Absolute 10/17/2013 0.6  0.1 - 1.0 K/uL Final  . Eosinophils Relative 10/17/2013 3  0 - 5 % Final  . Eosinophils Absolute 10/17/2013 0.2  0.0 - 0.7 K/uL Final  . Basophils Relative 10/17/2013 1  0 - 1 % Final  . Basophils Absolute 10/17/2013 0.1  0.0 - 0.1 K/uL Final  . Smear Review 10/17/2013 Criteria for review not met   Final  . Sodium 10/17/2013 143  135 - 145 mEq/L Final  . Potassium 10/17/2013 4.4  3.5 - 5.3 mEq/L Final  . Chloride 10/17/2013 106  96 - 112 mEq/L Final  . CO2 10/17/2013 27  19 - 32 mEq/L Final  . Glucose, Bld  10/17/2013 85  70 - 99 mg/dL Final  . BUN 10/17/2013 16  6 - 23 mg/dL Final  . Creat 10/17/2013 1.00  0.50 - 1.10 mg/dL Final  . Total Bilirubin 10/17/2013 0.4  0.2 - 1.2 mg/dL Final   ** Please note change in reference range(s). **  . Alkaline Phosphatase 10/17/2013 81  39 - 117 U/L Final  . AST 10/17/2013 15  0 - 37 U/L Final  . ALT 10/17/2013 13  0 - 35 U/L Final  . Total Protein 10/17/2013 6.4  6.0 - 8.3 g/dL Final  . Albumin 10/17/2013 4.1  3.5 - 5.2 g/dL Final  . Calcium 10/17/2013 9.5  8.4 - 10.5 mg/dL Final  . Hemoglobin A1C 10/17/2013 8.6* <5.7 % Final   Comment:  According to the ADA Clinical Practice Recommendations for 2011, when                          HbA1c is used as a screening test:                                                       >=6.5%   Diagnostic of Diabetes Mellitus                                     (if abnormal result is confirmed)                                                     5.7-6.4%   Increased risk of developing Diabetes Mellitus                                                     References:Diagnosis and Classification of Diabetes Mellitus,Diabetes                          OTRR,1165,79(UXYBF 1):S62-S69 and Standards of Medical Care in                                  Diabetes - 2011,Diabetes XOVA,9191,66 (Suppl 1):S11-S61.                             . Mean Plasma Glucose 10/17/2013 200* <117 mg/dL Final  . Cholesterol 10/17/2013 172  0 - 200 mg/dL Final   Comment: ATP III Classification:                                < 200        mg/dL        Desirable                               200 - 239     mg/dL        Borderline High                               >= 240        mg/dL        High                             . Triglycerides 10/17/2013 195* <150 mg/dL Final  . HDL 10/17/2013 38* >39 mg/dL Final  . Total CHOL/HDL Ratio 10/17/2013 4.5   Final    . VLDL 10/17/2013 39  0 - 40 mg/dL Final  .  LDL Cholesterol 10/17/2013 95  0 - 99 mg/dL Final   Comment:                            Total Cholesterol/HDL Ratio:CHD Risk                                                 Coronary Heart Disease Risk Table                                                                 Men       Women                                   1/2 Average Risk              3.4        3.3                                       Average Risk              5.0        4.4                                    2X Average Risk              9.6        7.1                                    3X Average Risk             23.4       11.0                          Use the calculated Patient Ratio above and the CHD Risk table                           to determine the patient's CHD Risk.                          ATP III Classification (LDL):                                < 100        mg/dL         Optimal                               100 - 129     mg/dL  Near or Above Optimal                               130 - 159     mg/dL         Borderline High                               160 - 189     mg/dL         High                                > 190        mg/dL         Very High                              Past Medical History  Diagnosis Date  . Leg pain   . Joint pain   . Carotid artery occlusion   . CAD (coronary artery disease)     S/P cabg  . Peripheral vascular disease   . Arthritis   . CHF (congestive heart failure)   . Diabetes mellitus   . Hyperlipidemia   . Hypertension   . Myocardial infarction   . PVD (peripheral vascular disease)    Past Surgical History  Procedure Laterality Date  . Coronary artery bypass graft      1998  . Appendectomy    . Tubal ligation    . Cholecystectomy      Gall bladder  . Pr vein bypass graft,aorto-fem-pop     Current Outpatient Prescriptions on File Prior to Visit  Medication Sig Dispense Refill  . amLODipine (NORVASC) 5  MG tablet Take 1 tablet (5 mg total) by mouth daily.  90 tablet  0  . aspirin EC 81 MG tablet Take 81 mg by mouth daily.        . B-D INS SYR ULTRAFINE 1CC/31G 31G X 5/16" 1 ML MISC USE AS DIRECTED  100 each  6  . B-D ULTRAFINE III SHORT PEN 31G X 8 MM MISC USE 4 TIMES A DAY  100 each  6  . CINNAMON PO Take 1,000 mg by mouth 2 (two) times daily.        . Coenzyme Q10 (CO Q 10 PO) Take 100 mg by mouth daily.        . fluticasone (FLONASE) 50 MCG/ACT nasal spray Place 2 sprays into both nostrils daily.  16 g  2  . insulin aspart (NOVOLOG) 100 UNIT/ML injection Inject 10 Units into the skin once.       . insulin glargine (LANTUS) 100 UNIT/ML injection Inject 30 Units into the skin at bedtime.       . metFORMIN (GLUCOPHAGE) 1000 MG tablet TAKE 1 TABLETBY MOUTH TWICE A DAY WITH MEALS  60 tablet  5  . metoprolol succinate (TOPROL-XL) 100 MG 24 hr tablet TAKE 1 TABLET BY MOUTH EVERY DAY  90 tablet  2  . nitroGLYCERIN (NITROSTAT) 0.4 MG SL tablet Place 0.4 mg under the tongue every 5 (five) minutes as needed for chest pain.      . Pitavastatin Calcium (LIVALO) 4 MG TABS Take 1 tablet (4 mg total) by mouth daily.  90 tablet  2  . quinapril (ACCUPRIL)  40 MG tablet TAKE 1 TABLET BY MOUTH TWICE A DAY  180 tablet  1  . traMADol (ULTRAM) 50 MG tablet Take 1 tablet (50 mg total) by mouth every 6 (six) hours as needed.  60 tablet  1   No current facility-administered medications on file prior to visit.   Allergies  Allergen Reactions  . Levaquin [Levofloxacin In D5w]     Pain all over and in joints  . Statins Other (See Comments)    Severe myalgias to lipitor, crestor, pravastaitin   History   Social History  . Marital Status: Divorced    Spouse Name: N/A    Number of Children: N/A  . Years of Education: N/A   Occupational History  . Not on file.   Social History Main Topics  . Smoking status: Former Smoker -- 0.25 packs/day    Types: Cigarettes    Quit date: 09/13/2011  . Smokeless tobacco:  Never Used  . Alcohol Use: No  . Drug Use: No  . Sexual Activity: Not on file   Other Topics Concern  . Not on file   Social History Narrative  . No narrative on file   Family History  Problem Relation Age of Onset  . Heart disease Mother   . Diabetes Mother   . Hyperlipidemia Mother   . Hypertension Mother   . Heart disease Father     Heart Disease before age 63  . Diabetes Father   . Hyperlipidemia Father   . Hypertension Father   . Heart disease Sister     heart attack  . Diabetes Sister     Amputation  . Hypertension Sister   . Other Sister     history of amputation  . Heart attack Sister   . Coronary artery disease Other   . Diabetes Brother   . Hypertension Brother   . Heart disease Brother      Review of Systems  All other systems reviewed and are negative.       Objective:   Physical Exam  Vitals reviewed. Constitutional: She is oriented to person, place, and time. She appears well-developed and well-nourished. No distress.  HENT:  Head: Normocephalic and atraumatic.  Right Ear: External ear normal.  Left Ear: External ear normal.  Nose: Nose normal.  Mouth/Throat: Oropharynx is clear and moist. No oropharyngeal exudate.  Eyes: Conjunctivae and EOM are normal. Pupils are equal, round, and reactive to light. Right eye exhibits no discharge. Left eye exhibits no discharge. No scleral icterus.  Neck: Normal range of motion. Neck supple. No JVD present. No tracheal deviation present. No thyromegaly present.  Cardiovascular: Normal rate, regular rhythm, normal heart sounds and intact distal pulses.  Exam reveals no gallop and no friction rub.   No murmur heard. Pulmonary/Chest: Effort normal and breath sounds normal. No stridor. No respiratory distress. She has no wheezes. She has no rales. She exhibits no tenderness.  Abdominal: Soft. Bowel sounds are normal. She exhibits no distension and no mass. There is no tenderness. There is no rebound and no  guarding.  Genitourinary: Vagina normal and uterus normal.  Musculoskeletal: Normal range of motion. She exhibits no edema and no tenderness.  Lymphadenopathy:    She has no cervical adenopathy.  Neurological: She is alert and oriented to person, place, and time. She has normal reflexes. She displays normal reflexes. No cranial nerve deficit. She exhibits normal muscle tone. Coordination normal.  Skin: Skin is warm. No rash noted. She is not diaphoretic.  No erythema. No pallor.  Psychiatric: She has a normal mood and affect. Her behavior is normal. Judgment and thought content normal.          Assessment & Plan:  1. Routine general medical examination at a health care facility Patient's cancer screening is up to date except for her Pap smear. I performed or Pap smear today in clinic. She is currently taking an aspirin every day. She is due for annual eye exam with her ophthalmologist and recommended this. Her blood pressure and cholesterol are excellent. Her hemoglobin A1c is elevated. The I recommended that she discontinue NovoLog and replace it with invokana 300 mg poqday.  Recheck hemoglobin A1c in 3 months. Also recommended the patient receive Prevnar 13 today in the office. Otherwise in his care is up to date. Recheck in 3 months.  Patient does complain of dizziness upon standing. I believe she is having some orthostatic dizziness. I recommend she temporarily tried cutting Toprol in half and see if the symptom improves. - Pap IG (Image Guided) Randell Loop

## 2013-10-21 NOTE — Addendum Note (Signed)
Addended by: Shary Decamp B on: 10/21/2013 03:13 PM   Modules accepted: Orders

## 2013-10-22 LAB — PAP IG (IMAGE GUIDED)

## 2013-11-05 ENCOUNTER — Telehealth: Payer: Self-pay | Admitting: Family Medicine

## 2013-11-05 NOTE — Telephone Encounter (Signed)
Call back number is [phone] Pt states that she was given samples of a diabetic medication Brenda Moreno (I could not understand her exactly with the spelling) She would said it has worked very well And she would also like a 90 day supply on all of her medications

## 2013-11-06 ENCOUNTER — Other Ambulatory Visit: Payer: Self-pay | Admitting: Family Medicine

## 2013-11-06 MED ORDER — CANAGLIFLOZIN 300 MG PO TABS: 300.0000 mg | ORAL_TABLET | Freq: Every day | ORAL | Status: DC

## 2013-11-06 NOTE — Telephone Encounter (Signed)
Med sent to pharm 

## 2013-11-09 ENCOUNTER — Other Ambulatory Visit: Payer: Self-pay | Admitting: Family Medicine

## 2013-11-21 ENCOUNTER — Telehealth: Payer: Self-pay | Admitting: *Deleted

## 2013-11-21 ENCOUNTER — Other Ambulatory Visit: Payer: Self-pay | Admitting: *Deleted

## 2013-11-21 NOTE — Telephone Encounter (Signed)
Received letter requesting PA for Invokana.   PA submitted.

## 2013-11-21 NOTE — Telephone Encounter (Signed)
Insurance requires that the pt try an alternative or contraindication reason for medication choice. Per Dr. Dennard Schaumann OK to switch to Tradjenta 5mg  po qd. Tried to call pt - no answer and no vm

## 2013-11-22 MED ORDER — LINAGLIPTIN 5 MG PO TABS: 5.0000 mg | ORAL_TABLET | Freq: Every day | ORAL | Status: DC

## 2013-11-25 ENCOUNTER — Other Ambulatory Visit: Payer: Self-pay | Admitting: Family Medicine

## 2013-11-25 NOTE — Telephone Encounter (Signed)
Refill appropriate and filled per protocol. 

## 2013-12-18 ENCOUNTER — Telehealth: Payer: Self-pay | Admitting: Family Medicine

## 2013-12-18 MED ORDER — CANAGLIFLOZIN 300 MG PO TABS: 300.0000 mg | ORAL_TABLET | Freq: Every day | ORAL | Status: DC

## 2013-12-18 NOTE — Telephone Encounter (Signed)
Pt did try Tradjenta per insurance and had an allergic reaction to it. [employer] and got invokana approved as pt said it was working good. Approved through 08/28/14 - will notify pharmacy

## 2013-12-31 ENCOUNTER — Other Ambulatory Visit: Payer: Self-pay | Admitting: Family Medicine

## 2014-01-07 ENCOUNTER — Other Ambulatory Visit: Payer: Self-pay | Admitting: Family Medicine

## 2014-01-07 MED ORDER — INSULIN GLARGINE 100 UNIT/ML ~~LOC~~ SOLN: 30.0000 [IU] | Freq: Every day | SUBCUTANEOUS | Status: DC

## 2014-01-07 NOTE — Telephone Encounter (Signed)
Rx Refilled  

## 2014-01-08 ENCOUNTER — Other Ambulatory Visit: Payer: Self-pay | Admitting: Family Medicine

## 2014-01-23 ENCOUNTER — Encounter: Payer: Self-pay | Admitting: Family Medicine

## 2014-01-23 ENCOUNTER — Ambulatory Visit (INDEPENDENT_AMBULATORY_CARE_PROVIDER_SITE_OTHER): Payer: Medicare HMO | Admitting: Family Medicine

## 2014-01-23 ENCOUNTER — Other Ambulatory Visit: Payer: Self-pay | Admitting: Family Medicine

## 2014-01-23 VITALS — BP 126/74 | HR 60 | Temp 98.0°F | Resp 14 | Ht 64.0 in | Wt 187.0 lb

## 2014-01-23 DIAGNOSIS — K5289 Other specified noninfective gastroenteritis and colitis: Secondary | ICD-10-CM

## 2014-01-23 DIAGNOSIS — K529 Noninfective gastroenteritis and colitis, unspecified: Secondary | ICD-10-CM

## 2014-01-23 MED ORDER — DIPHENOXYLATE-ATROPINE 2.5-0.025 MG PO TABS: 2.0000 | ORAL_TABLET | Freq: Four times a day (QID) | ORAL | Status: DC | PRN

## 2014-01-23 MED ORDER — PROMETHAZINE HCL 12.5 MG PO TABS: 12.5000 mg | ORAL_TABLET | Freq: Four times a day (QID) | ORAL | Status: DC | PRN

## 2014-01-23 NOTE — Progress Notes (Signed)
Subjective:    Patient ID: Brenda Moreno, female    DOB: 11-17-1945, 68 y.o.   MRN: [id number]  HPI Symptoms began one week ago.  It started with an upper respiratory infection including rhinorrhea and sinus pressure. He then progressed to nausea and vomiting. She is now having several episodes of diarrhea on a daily basis. She denies any fevers or chills. She denies any melena or hematochezia. She denies any recent travel outside the country. She denies any sick contacts. She denies any abdominal pain. Past Medical History  Diagnosis Date  . Leg pain   . Joint pain   . Carotid artery occlusion   . CAD (coronary artery disease)     S/P cabg  . Peripheral vascular disease   . Arthritis   . CHF (congestive heart failure)   . Diabetes mellitus   . Hyperlipidemia   . Hypertension   . Myocardial infarction   . PVD (peripheral vascular disease)    Past Surgical History  Procedure Laterality Date  . Coronary artery bypass graft      1998  . Appendectomy    . Tubal ligation    . Cholecystectomy      Gall bladder  . Pr vein bypass graft,aorto-fem-pop     Current Outpatient Prescriptions on File Prior to Visit  Medication Sig Dispense Refill  . amLODipine (NORVASC) 5 MG tablet TAKE 1 TABLET (5 MG TOTAL) BY MOUTH DAILY.  90 tablet  3  . aspirin EC 81 MG tablet Take 81 mg by mouth daily.        . B-D INS SYR ULTRAFINE 1CC/31G 31G X 5/16" 1 ML MISC USE AS DIRECTED  100 each  6  . B-D ULTRAFINE III SHORT PEN 31G X 8 MM MISC USE 4 TIMES A DAY  100 each  6  . Canagliflozin 300 MG TABS Take 1 tablet (300 mg total) by mouth daily.  90 tablet  1  . Canagliflozin 300 MG TABS Take 1 tablet (300 mg total) by mouth daily.  30 tablet  3  . CINNAMON PO Take 1,000 mg by mouth 2 (two) times daily.        . Coenzyme Q10 (CO Q 10 PO) Take 100 mg by mouth daily.        . fluticasone (FLONASE) 50 MCG/ACT nasal spray PLACE 2 SPRAYS INTO BOTH NOSTRILS DAILY.  16 g  2  . insulin aspart (NOVOLOG) 100  UNIT/ML injection Inject 10 Units into the skin once.       . insulin glargine (LANTUS) 100 UNIT/ML injection Inject 0.3 mLs (30 Units total) into the skin at bedtime.  10 mL  3  . metFORMIN (GLUCOPHAGE) 1000 MG tablet TAKE 1 TABLETBY MOUTH TWICE A DAY WITH MEALS  60 tablet  5  . metoprolol succinate (TOPROL-XL) 100 MG 24 hr tablet TAKE 1 TABLET BY MOUTH EVERY DAY  90 tablet  2  . nitroGLYCERIN (NITROSTAT) 0.4 MG SL tablet Place 0.4 mg under the tongue every 5 (five) minutes as needed for chest pain.      . Pitavastatin Calcium (LIVALO) 4 MG TABS Take 1 tablet (4 mg total) by mouth daily.  90 tablet  2  . traMADol (ULTRAM) 50 MG tablet Take 1 tablet (50 mg total) by mouth every 6 (six) hours as needed.  60 tablet  1   No current facility-administered medications on file prior to visit.   Allergies  Allergen Reactions  . Levaquin [Levofloxacin In D5w]  Pain all over and in joints  . Statins Other (See Comments)    Severe myalgias to lipitor, crestor, pravastaitin  . Tradjenta [Linagliptin] Itching   History   Social History  . Marital Status: Divorced    Spouse Name: N/A    Number of Children: N/A  . Years of Education: N/A   Occupational History  . Not on file.   Social History Main Topics  . Smoking status: Former Smoker -- 0.25 packs/day    Types: Cigarettes    Quit date: 09/13/2011  . Smokeless tobacco: Never Used  . Alcohol Use: No  . Drug Use: No  . Sexual Activity: Not on file   Other Topics Concern  . Not on file   Social History Narrative  . No narrative on file      Review of Systems  All other systems reviewed and are negative.      Objective:   Physical Exam  Vitals reviewed. Constitutional: She appears well-developed and well-nourished. No distress.  Cardiovascular: Normal rate, regular rhythm and normal heart sounds.   No murmur heard. Pulmonary/Chest: Effort normal and breath sounds normal. No respiratory distress. She has no wheezes. She has  no rales.  Abdominal: Soft. Bowel sounds are normal. She exhibits no distension. There is no tenderness. There is no rebound and no guarding.  Skin: She is not diaphoretic.          Assessment & Plan:  1. Gastroenteritis I suspect viral gastroenteritis.  I recommended Lomotil 2 tablets every 6 hours as needed for diarrhea. I recommended Phenergan 1 tablet every 6 hours as needed for vomiting. I recommended BRAT diet.  I recommended the patient fluids. I recommended she temporarily discontinue quanipril and metformin until she feels better.  Anticipate spontaneous resolution in 3-4 days. Recheck immediately if worse - diphenoxylate-atropine (LOMOTIL) 2.5-0.025 MG per tablet; Take 2 tablets by mouth 4 (four) times daily as needed for diarrhea or loose stools.  Dispense: 30 tablet; Refill: 0 - promethazine (PHENERGAN) 12.5 MG tablet; Take 1 tablet (12.5 mg total) by mouth every 6 (six) hours as needed for nausea or vomiting.  Dispense: 30 tablet; Refill: 0

## 2014-01-23 NOTE — Telephone Encounter (Signed)
Refill appropriate and filled per protocol. 

## 2014-02-14 ENCOUNTER — Encounter: Payer: Self-pay | Admitting: Family Medicine

## 2014-03-08 ENCOUNTER — Other Ambulatory Visit: Payer: Self-pay | Admitting: *Deleted

## 2014-03-08 DIAGNOSIS — I10 Essential (primary) hypertension: Secondary | ICD-10-CM

## 2014-03-08 DIAGNOSIS — E1159 Type 2 diabetes mellitus with other circulatory complications: Secondary | ICD-10-CM

## 2014-03-08 DIAGNOSIS — E785 Hyperlipidemia, unspecified: Secondary | ICD-10-CM

## 2014-03-17 ENCOUNTER — Other Ambulatory Visit: Payer: Self-pay | Admitting: Family Medicine

## 2014-03-25 ENCOUNTER — Other Ambulatory Visit: Payer: Self-pay | Admitting: Family Medicine

## 2014-03-25 MED ORDER — METFORMIN HCL 1000 MG PO TABS: ORAL_TABLET | ORAL | Status: DC

## 2014-03-27 ENCOUNTER — Encounter: Payer: Self-pay | Admitting: Family Medicine

## 2014-03-27 ENCOUNTER — Ambulatory Visit (INDEPENDENT_AMBULATORY_CARE_PROVIDER_SITE_OTHER): Payer: Medicare HMO | Admitting: Family Medicine

## 2014-03-27 VITALS — BP 130/68 | HR 68 | Temp 98.0°F | Resp 16 | Ht 64.0 in | Wt 183.0 lb

## 2014-03-27 DIAGNOSIS — M5430 Sciatica, unspecified side: Secondary | ICD-10-CM

## 2014-03-27 DIAGNOSIS — M5431 Sciatica, right side: Secondary | ICD-10-CM

## 2014-03-27 MED ORDER — PREDNISONE 20 MG PO TABS: ORAL_TABLET | ORAL | Status: DC

## 2014-03-27 MED ORDER — TRAMADOL HCL 50 MG PO TABS: 50.0000 mg | ORAL_TABLET | Freq: Four times a day (QID) | ORAL | Status: DC | PRN

## 2014-03-27 NOTE — Progress Notes (Signed)
Subjective:    Patient ID: Brenda Moreno, female    DOB: Dec 29, 1945, 68 y.o.   MRN: [id number]  HPI  Patient reports low back pain radiating into her right hip down her right posterior thigh into her right calf and foot for approximately one month. The pain is described as a deep burning ache. It is worse with prolonged standing. It is better when she sits down. She denies any symptoms of claudication. Today on examination she has one over four pulses in the dorsalis pedis and posterior tibialis.  She denies any symptoms of cauda equina syndrome   Patient had MRI 2013 which revealed: IMPRESSION:  1. Progression of degenerative disc disease at L3-L4 eccentric to  the right with right greater than left lateral recess stenosis  potentially affecting both L5 nerves.  2. In this patient with left lower extremity radicular symptoms,  the left L3-L4 foraminal and extraforaminal bulge potentially  irritate the exiting left L3 nerve.  3. Improved L2-L3 right foraminal disc protrusion.  Past Medical History  Diagnosis Date  . Leg pain   . Joint pain   . Carotid artery occlusion   . CAD (coronary artery disease)     S/P cabg  . Peripheral vascular disease   . Arthritis   . CHF (congestive heart failure)   . Diabetes mellitus   . Hyperlipidemia   . Hypertension   . Myocardial infarction   . PVD (peripheral vascular disease)    Past Surgical History  Procedure Laterality Date  . Coronary artery bypass graft      1998  . Appendectomy    . Tubal ligation    . Cholecystectomy      Gall bladder  . Pr vein bypass graft,aorto-fem-pop     Current Outpatient Prescriptions on File Prior to Visit  Medication Sig Dispense Refill  . amLODipine (NORVASC) 5 MG tablet TAKE 1 TABLET (5 MG TOTAL) BY MOUTH DAILY.  90 tablet  3  . aspirin EC 81 MG tablet Take 81 mg by mouth daily.        . B-D INS SYR ULTRAFINE 1CC/31G 31G X 5/16" 1 ML MISC USE AS DIRECTED  100 each  6  . B-D ULTRAFINE III SHORT  PEN 31G X 8 MM MISC USE 4 TIMES A DAY  100 each  6  . Canagliflozin 300 MG TABS Take 1 tablet (300 mg total) by mouth daily.  90 tablet  1  . Canagliflozin 300 MG TABS Take 1 tablet (300 mg total) by mouth daily.  30 tablet  3  . CINNAMON PO Take 1,000 mg by mouth 2 (two) times daily.        . Coenzyme Q10 (CO Q 10 PO) Take 100 mg by mouth daily.        . diphenoxylate-atropine (LOMOTIL) 2.5-0.025 MG per tablet Take 2 tablets by mouth 4 (four) times daily as needed for diarrhea or loose stools.  30 tablet  0  . fluticasone (FLONASE) 50 MCG/ACT nasal spray PLACE 2 SPRAYS INTO BOTH NOSTRILS DAILY.  16 g  2  . insulin glargine (LANTUS) 100 UNIT/ML injection Inject 0.3 mLs (30 Units total) into the skin at bedtime.  10 mL  3  . LIVALO 4 MG TABS TAKE 1 TABLET BY MOUTH DAILY  90 tablet  2  . metFORMIN (GLUCOPHAGE) 1000 MG tablet TAKE 1 TABLETBY MOUTH TWICE A DAY WITH MEALS  180 tablet  1  . metoprolol succinate (TOPROL-XL) 100 MG 24 hr  tablet TAKE 1 TABLET BY MOUTH EVERY DAY  90 tablet  2  . nitroGLYCERIN (NITROSTAT) 0.4 MG SL tablet Place 0.4 mg under the tongue every 5 (five) minutes as needed for chest pain.      . promethazine (PHENERGAN) 12.5 MG tablet Take 1 tablet (12.5 mg total) by mouth every 6 (six) hours as needed for nausea or vomiting.  30 tablet  0  . quinapril (ACCUPRIL) 40 MG tablet TAKE 1 TABLET BY MOUTH TWICE A DAY  180 tablet  1   No current facility-administered medications on file prior to visit.   Allergies  Allergen Reactions  . Levaquin [Levofloxacin In D5w]     Pain all over and in joints  . Statins Other (See Comments)    Severe myalgias to lipitor, crestor, pravastaitin  . Tradjenta [Linagliptin] Itching   History   Social History  . Marital Status: Divorced    Spouse Name: N/A    Number of Children: N/A  . Years of Education: N/A   Occupational History  . Not on file.   Social History Main Topics  . Smoking status: Former Smoker -- 0.25 packs/day    Types:  Cigarettes    Quit date: 09/13/2011  . Smokeless tobacco: Never Used  . Alcohol Use: No  . Drug Use: No  . Sexual Activity: Not on file   Other Topics Concern  . Not on file   Social History Narrative  . No narrative on file     Review of Systems  All other systems reviewed and are negative.      Objective:   Physical Exam  Vitals reviewed. Cardiovascular: Normal rate, regular rhythm, normal heart sounds and intact distal pulses.   No murmur heard. Pulmonary/Chest: Effort normal and breath sounds normal. No respiratory distress. She has no wheezes. She has no rales.  Abdominal: Soft. Bowel sounds are normal. She exhibits no distension. There is no tenderness. There is no rebound and no guarding.  Musculoskeletal: She exhibits no edema.  Neurological: She has normal reflexes. She displays normal reflexes. No cranial nerve deficit. She exhibits normal muscle tone. Coordination normal.          Assessment & Plan:  1. Right sided sciatica The patient is experiencing right leg sciatica. Begin prednisone taper pack. I encouraged the patient to drink plenty of water over the next 6 days I anticipate that this will increase her blood sugars. She can also use tramadol 50 mg every 6 hours when necessary pain. If pain persist I would proceed with an MRI of the lumbar spine - predniSONE (DELTASONE) 20 MG tablet; Take 3 daily for 2 days, then 2 daily for 2 days, then 1 daily for 2 days.  Dispense: 12 tablet; Refill: 0 - traMADol (ULTRAM) 50 MG tablet; Take 1 tablet (50 mg total) by mouth every 6 (six) hours as needed.  Dispense: 60 tablet; Refill: 1

## 2014-04-09 ENCOUNTER — Ambulatory Visit (INDEPENDENT_AMBULATORY_CARE_PROVIDER_SITE_OTHER): Payer: Medicare HMO | Admitting: Physician Assistant

## 2014-04-09 ENCOUNTER — Encounter: Payer: Self-pay | Admitting: Physician Assistant

## 2014-04-09 VITALS — BP 120/72 | HR 72 | Temp 98.1°F | Resp 16 | Wt 182.0 lb

## 2014-04-09 DIAGNOSIS — M5431 Sciatica, right side: Secondary | ICD-10-CM

## 2014-04-09 DIAGNOSIS — M5430 Sciatica, unspecified side: Secondary | ICD-10-CM

## 2014-04-09 NOTE — Progress Notes (Signed)
Patient ID: Brenda Moreno MRN: [id number], DOB: 1946-07-14, 68 y.o. Date of Encounter: 04/09/2014, 10:09 AM    Chief Complaint:  Chief Complaint  Patient presents with  . Back Pain    leg pain right X 3 weeks     HPI: 68 y.o. year old female here with above complaints.  She tells me that she has had injections in her low back in the past. However I am not sure who she had these performed. She says they were done at [facility] but I'm not sure if that's correct. Nonetheless she thinks that she last had these injections about 2 years ago and says that she thinks she also had some done prior to that as well.  As well she says that in general she has had this same type of symptoms in the past but says that the last time she had this type of discomfort was 2 years ago. Says that the symptoms had then pretty much resolved until they recurred 2 weeks ago. Says that she saw Dr. Dennard Schaumann when the symptoms reoccurred 2 weeks ago. Says that during the first 2 days of prednisone she had no discomfort. However on the third day of prednisone she had some nagging discomfort. Says that now the pain is back to where it was at the time of her visit with Dr. Dennard Schaumann.  She points along the lateral aspect of the right leg and says that she has discomfort all the way down the right leg starting at the buttock and going all the way down the lower leg to the right lateral malleolus. She says that in the morning when she first gets up she feels some more discomfort in the buttock and thigh area. However during the day the longer she is standing etc. , the worse the discomfort gets in the lower leg.  She took the prednisone as directed at the last visit. Regarding the tramadol, she is has been taking one pill at the time. She says that " this does take the edge off."    THE FOLLOWING IS COPIED FROM DR. PICKARD'S OV NOTE DATED 03/27/2014:   Patient reports low back pain radiating into her right hip down  her right posterior thigh into her right calf and foot for approximately one month. The pain is described as a deep burning ache. It is worse with prolonged standing. It is better when she sits down. She denies any symptoms of claudication. Today on examination she has one over four pulses in the dorsalis pedis and posterior tibialis. She denies any symptoms of cauda equina syndrome  Patient had MRI 2013 which revealed:  IMPRESSION:  1. Progression of degenerative disc disease at L3-L4 eccentric to  the right with right greater than left lateral recess stenosis  potentially affecting both L5 nerves.  2. In this patient with left lower extremity radicular symptoms,  the left L3-L4 foraminal and extraforaminal bulge potentially  irritate the exiting left L3 nerve.  3. Improved L2-L3 right foraminal disc protrusion.  1. Right sided sciatica  The patient is experiencing right leg sciatica. Begin prednisone taper pack. I encouraged the patient to drink plenty of water over the next 6 days I anticipate that this will increase her blood sugars. She can also use tramadol 50 mg every 6 hours when necessary pain. If pain persist I would proceed with an MRI of the lumbar spine  - predniSONE (DELTASONE) 20 MG tablet; Take 3 daily for 2 days, then 2  daily for 2 days, then 1 daily for 2 days. Dispense: 12 tablet; Refill: 0  - traMADol (ULTRAM) 50 MG tablet; Take 1 tablet (50 mg total) by mouth every 6 (six) hours as needed. Dispense: 60 tablet; Refill: 1     ALSO, TODAY PT SAYS THAT SHE HAD BROUGHT [place] A FORM SO SHE COULD GET DIABETIC SHOES. SAYS SHE HAS NOT YET HEARD ANY F/U REGARDING THIS.  Home Meds:   Outpatient Prescriptions Prior to Visit  Medication Sig Dispense Refill  . amLODipine (NORVASC) 5 MG tablet TAKE 1 TABLET (5 MG TOTAL) BY MOUTH DAILY.  90 tablet  3  . aspirin EC 81 MG tablet Take 81 mg by mouth daily.        . B-D INS SYR ULTRAFINE 1CC/31G 31G X 5/16" 1 ML MISC USE AS DIRECTED  100  each  6  . B-D ULTRAFINE III SHORT PEN 31G X 8 MM MISC USE 4 TIMES A DAY  100 each  6  . Canagliflozin 300 MG TABS Take 1 tablet (300 mg total) by mouth daily.  90 tablet  1  . Canagliflozin 300 MG TABS Take 1 tablet (300 mg total) by mouth daily.  30 tablet  3  . CINNAMON PO Take 1,000 mg by mouth 2 (two) times daily.        . Coenzyme Q10 (CO Q 10 PO) Take 100 mg by mouth daily.        . diphenoxylate-atropine (LOMOTIL) 2.5-0.025 MG per tablet Take 2 tablets by mouth 4 (four) times daily as needed for diarrhea or loose stools.  30 tablet  0  . fluticasone (FLONASE) 50 MCG/ACT nasal spray PLACE 2 SPRAYS INTO BOTH NOSTRILS DAILY.  16 g  2  . insulin glargine (LANTUS) 100 UNIT/ML injection Inject 0.3 mLs (30 Units total) into the skin at bedtime.  10 mL  3  . LIVALO 4 MG TABS TAKE 1 TABLET BY MOUTH DAILY  90 tablet  2  . metFORMIN (GLUCOPHAGE) 1000 MG tablet TAKE 1 TABLETBY MOUTH TWICE A DAY WITH MEALS  180 tablet  1  . metoprolol succinate (TOPROL-XL) 100 MG 24 hr tablet TAKE 1 TABLET BY MOUTH EVERY DAY  90 tablet  2  . nitroGLYCERIN (NITROSTAT) 0.4 MG SL tablet Place 0.4 mg under the tongue every 5 (five) minutes as needed for chest pain.      . predniSONE (DELTASONE) 20 MG tablet Take 3 daily for 2 days, then 2 daily for 2 days, then 1 daily for 2 days.  12 tablet  0  . promethazine (PHENERGAN) 12.5 MG tablet Take 1 tablet (12.5 mg total) by mouth every 6 (six) hours as needed for nausea or vomiting.  30 tablet  0  . quinapril (ACCUPRIL) 40 MG tablet TAKE 1 TABLET BY MOUTH TWICE A DAY  180 tablet  1  . traMADol (ULTRAM) 50 MG tablet Take 1 tablet (50 mg total) by mouth every 6 (six) hours as needed.  60 tablet  1   No facility-administered medications prior to visit.    Allergies:  Allergies  Allergen Reactions  . Levaquin [Levofloxacin In D5w]     Pain all over and in joints  . Statins Other (See Comments)    Severe myalgias to lipitor, crestor, pravastaitin  . Tradjenta [Linagliptin]  Itching      Review of Systems: See HPI for pertinent ROS. All other ROS negative.    Physical Exam: Blood pressure 120/72, pulse 72, temperature 98.1 F (  36.7 C), temperature source Oral, resp. rate 16, weight 182 lb (82.555 kg)., Body mass index is 31.22 kg/(m^2). General:  WNWD Female. Appears in no acute distress. Neck: Supple. No thyromegaly. No lymphadenopathy. Lungs: Clear bilaterally to auscultation without wheezes, rales, or rhonchi. Breathing is unlabored. Heart: Regular rhythm. No murmurs, rubs, or gallops. Msk:  Strength and tone normal for age. Positive tenderness with palpation of right sciatic notch. 2+ patella reflexes bilaterally--equal, symetrical bilaterally.  Skin: Diabetic Foot Exam: Inspection is normal. There are no wounds and no calluses or other problem areas. Bilaterally, she has no palpable dorsalis pedis or posterior tibial pulses bilaterally. Sensory function/sensation is intact throughout bilaterally. Neuro: Alert and oriented X 3. Moves all extremities spontaneously. Gait is normal. CNII-XII grossly in tact. Psych:  Responds to questions appropriately with a normal affect.     ASSESSMENT AND PLAN:  68 y.o. year old female with  1. Right sided sciatica - MR Lumbar Spine Wo Contrast; Future Will obtain MRI. Told her to start taking 2 tramadol at a time. Follow up with [place] if she needs a refill on this or if this is not keeping her pain manageable. I will followup with her with further recommendations once we get the results of the MRI.  2. diabetic foot exam completed and documented today. Will follow up with getting her diabetic shoes.  Signed, [address] [place], [place], [facility] 04/09/2014 10:09 AM

## 2014-04-10 ENCOUNTER — Telehealth: Payer: Self-pay | Admitting: *Deleted

## 2014-04-10 NOTE — Telephone Encounter (Signed)
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 I will hold referral until call back.

## 2014-04-16 ENCOUNTER — Other Ambulatory Visit: Payer: Self-pay | Admitting: Family Medicine

## 2014-04-16 ENCOUNTER — Other Ambulatory Visit: Payer: Medicare HMO

## 2014-04-16 DIAGNOSIS — E785 Hyperlipidemia, unspecified: Secondary | ICD-10-CM

## 2014-04-16 DIAGNOSIS — E1159 Type 2 diabetes mellitus with other circulatory complications: Secondary | ICD-10-CM

## 2014-04-16 DIAGNOSIS — I10 Essential (primary) hypertension: Secondary | ICD-10-CM

## 2014-04-16 LAB — COMPLETE METABOLIC PANEL WITH GFR
ALBUMIN: 4 g/dL (ref 3.5–5.2)
ALT: 16 U/L (ref 0–35)
AST: 15 U/L (ref 0–37)
Alkaline Phosphatase: 68 U/L (ref 39–117)
BUN: 26 mg/dL — ABNORMAL HIGH (ref 6–23)
CHLORIDE: 108 meq/L (ref 96–112)
CO2: 23 meq/L (ref 19–32)
Calcium: 9.6 mg/dL (ref 8.4–10.5)
Creat: 1.37 mg/dL — ABNORMAL HIGH (ref 0.50–1.10)
GFR, EST AFRICAN AMERICAN: 46 mL/min — AB
GFR, EST NON AFRICAN AMERICAN: 40 mL/min — AB
GLUCOSE: 97 mg/dL (ref 70–99)
POTASSIUM: 4.2 meq/L (ref 3.5–5.3)
SODIUM: 143 meq/L (ref 135–145)
TOTAL PROTEIN: 6.7 g/dL (ref 6.0–8.3)
Total Bilirubin: 0.5 mg/dL (ref 0.2–1.2)

## 2014-04-16 LAB — CBC WITH DIFFERENTIAL/PLATELET
Basophils Absolute: 0.1 10*3/uL (ref 0.0–0.1)
Basophils Relative: 1 % (ref 0–1)
Eosinophils Absolute: 0.1 10*3/uL (ref 0.0–0.7)
Eosinophils Relative: 1 % (ref 0–5)
HCT: 45.1 % (ref 36.0–46.0)
Hemoglobin: 15.3 g/dL — ABNORMAL HIGH (ref 12.0–15.0)
Lymphocytes Relative: 41 % (ref 12–46)
Lymphs Abs: 3 10*3/uL (ref 0.7–4.0)
MCH: 27.3 pg (ref 26.0–34.0)
MCHC: 33.9 g/dL (ref 30.0–36.0)
MCV: 80.5 fL (ref 78.0–100.0)
Monocytes Absolute: 0.6 10*3/uL (ref 0.1–1.0)
Monocytes Relative: 8 % (ref 3–12)
Neutro Abs: 3.5 10*3/uL (ref 1.7–7.7)
Neutrophils Relative %: 49 % (ref 43–77)
Platelets: 233 10*3/uL (ref 150–400)
RBC: 5.6 MIL/uL — ABNORMAL HIGH (ref 3.87–5.11)
RDW: 16.6 % — ABNORMAL HIGH (ref 11.5–15.5)
WBC: 7.2 10*3/uL (ref 4.0–10.5)

## 2014-04-16 LAB — HEMOGLOBIN A1C
Hgb A1c MFr Bld: 9 % — ABNORMAL HIGH (ref <5.7)
Mean Plasma Glucose: 212 mg/dL — ABNORMAL HIGH (ref <117)

## 2014-04-16 LAB — LIPID PANEL
Cholesterol: 163 mg/dL (ref 0–200)
HDL: 36 mg/dL — ABNORMAL LOW (ref >39)
LDL Cholesterol: 89 mg/dL (ref 0–99)
Total CHOL/HDL Ratio: 4.5 Ratio
Triglycerides: 192 mg/dL — ABNORMAL HIGH (ref <150)
VLDL: 38 mg/dL (ref 0–40)

## 2014-04-16 NOTE — Telephone Encounter (Signed)
Refill appropriate and filled per protocol. 

## 2014-04-19 ENCOUNTER — Encounter: Payer: Self-pay | Admitting: *Deleted

## 2014-04-29 ENCOUNTER — Encounter: Payer: Self-pay | Admitting: Family Medicine

## 2014-04-29 ENCOUNTER — Ambulatory Visit (INDEPENDENT_AMBULATORY_CARE_PROVIDER_SITE_OTHER): Payer: Medicare HMO | Admitting: Family Medicine

## 2014-04-29 VITALS — BP 128/60 | HR 80 | Temp 98.5°F | Resp 14 | Ht 64.0 in | Wt 180.0 lb

## 2014-04-29 DIAGNOSIS — N281 Cyst of kidney, acquired: Secondary | ICD-10-CM

## 2014-04-29 DIAGNOSIS — E119 Type 2 diabetes mellitus without complications: Secondary | ICD-10-CM

## 2014-04-29 DIAGNOSIS — Q619 Cystic kidney disease, unspecified: Secondary | ICD-10-CM

## 2014-04-29 DIAGNOSIS — IMO0001 Reserved for inherently not codable concepts without codable children: Secondary | ICD-10-CM

## 2014-04-29 DIAGNOSIS — Z794 Long term (current) use of insulin: Secondary | ICD-10-CM

## 2014-04-29 MED ORDER — INSULIN ASPART 100 UNIT/ML ~~LOC~~ SOLN: SUBCUTANEOUS | Status: DC

## 2014-04-29 NOTE — Progress Notes (Signed)
Subjective:    Patient ID: Brenda Moreno, female    DOB: 31-Jul-1946, 68 y.o.   MRN: [id number]  HPI Patient is here today to discuss her diabetes. She is currently taking 30 units of Levemir once daily. She is also taking invokana.  Hemoglobin A1c has risen to 9.0. Patient is adamant that her fasting blood sugars range 80-110. However she is not checking two-hour postprandial sugars. She denies any hypoglycemic episodes. Her blood pressures well controlled at 128/60. Her cholesterol is acceptable. Her LDL cholesterol is 89. Her goal LDL cholesterol be less than 70 given her history of coronary artery disease. The patient is hesitant to take any further medication.  Patient's renal function liver function are stable. Recently she had an MRI of the lumbar spine performed orthopedic office. This revealed a 10 mm hypointense complex right kidney cyst. It was thought to be a complex cyst that needs further characterization. Past Medical History  Diagnosis Date  . Leg pain   . Joint pain   . Carotid artery occlusion   . CAD (coronary artery disease)     S/P cabg  . Peripheral vascular disease   . Arthritis   . CHF (congestive heart failure)   . Diabetes mellitus   . Hyperlipidemia   . Hypertension   . Myocardial infarction   . PVD (peripheral vascular disease)    Past Surgical History  Procedure Laterality Date  . Coronary artery bypass graft      1998  . Appendectomy    . Tubal ligation    . Cholecystectomy      Gall bladder  . Pr vein bypass graft,aorto-fem-pop     Current Outpatient Prescriptions on File Prior to Visit  Medication Sig Dispense Refill  . amLODipine (NORVASC) 5 MG tablet TAKE 1 TABLET (5 MG TOTAL) BY MOUTH DAILY.  90 tablet  3  . aspirin EC 81 MG tablet Take 81 mg by mouth daily.        . B-D INS SYR ULTRAFINE 1CC/31G 31G X 5/16" 1 ML MISC USE AS DIRECTED  100 each  6  . B-D ULTRAFINE III SHORT PEN 31G X 8 MM MISC USE 4 TIMES A DAY  100 each  6  . CINNAMON PO Take  1,000 mg by mouth 2 (two) times daily.        . Coenzyme Q10 (CO Q 10 PO) Take 100 mg by mouth daily.        . diphenoxylate-atropine (LOMOTIL) 2.5-0.025 MG per tablet Take 2 tablets by mouth 4 (four) times daily as needed for diarrhea or loose stools.  30 tablet  0  . fluticasone (FLONASE) 50 MCG/ACT nasal spray PLACE 2 SPRAYS INTO BOTH NOSTRILS DAILY.  16 g  2  . INVOKANA 300 MG TABS TAKE 1 TABLET BY MOUTH EVERY DAY  30 tablet  3  . LIVALO 4 MG TABS TAKE 1 TABLET BY MOUTH DAILY  90 tablet  2  . metFORMIN (GLUCOPHAGE) 1000 MG tablet TAKE 1 TABLETBY MOUTH TWICE A DAY WITH MEALS  180 tablet  1  . metoprolol succinate (TOPROL-XL) 100 MG 24 hr tablet TAKE 1 TABLET BY MOUTH EVERY DAY  90 tablet  2  . nitroGLYCERIN (NITROSTAT) 0.4 MG SL tablet Place 0.4 mg under the tongue every 5 (five) minutes as needed for chest pain.      . promethazine (PHENERGAN) 12.5 MG tablet Take 1 tablet (12.5 mg total) by mouth every 6 (six) hours as needed for nausea  or vomiting.  30 tablet  0  . quinapril (ACCUPRIL) 40 MG tablet TAKE 1 TABLET BY MOUTH TWICE A DAY  180 tablet  1  . traMADol (ULTRAM) 50 MG tablet Take 1 tablet (50 mg total) by mouth every 6 (six) hours as needed.  60 tablet  1   No current facility-administered medications on file prior to visit.   Allergies  Allergen Reactions  . Levaquin [Levofloxacin In D5w]     Pain all over and in joints  . Statins Other (See Comments)    Severe myalgias to lipitor, crestor, pravastaitin  . Tradjenta [Linagliptin] Itching   History   Social History  . Marital Status: Divorced    Spouse Name: N/A    Number of Children: N/A  . Years of Education: N/A   Occupational History  . Not on file.   Social History Main Topics  . Smoking status: Former Smoker -- 0.25 packs/day    Types: Cigarettes    Quit date: 09/13/2011  . Smokeless tobacco: Never Used  . Alcohol Use: No  . Drug Use: No  . Sexual Activity: Not on file   Other Topics Concern  . Not on file    Social History Narrative  . No narrative on file   Family History  Problem Relation Age of Onset  . Heart disease Mother   . Diabetes Mother   . Hyperlipidemia Mother   . Hypertension Mother   . Heart disease Father     Heart Disease before age 4  . Diabetes Father   . Hyperlipidemia Father   . Hypertension Father   . Heart disease Sister     heart attack  . Diabetes Sister     Amputation  . Hypertension Sister   . Other Sister     history of amputation  . Heart attack Sister   . Coronary artery disease Other   . Diabetes Brother   . Hypertension Brother   . Heart disease Brother       Review of Systems  All other systems reviewed and are negative.      Objective:   Physical Exam  Vitals reviewed. Cardiovascular: Normal rate, regular rhythm and normal heart sounds.  Exam reveals no gallop and no friction rub.   No murmur heard. Pulmonary/Chest: Effort normal and breath sounds normal. No respiratory distress. She has no wheezes. She has no rales. She exhibits no tenderness.  Abdominal: Soft. Bowel sounds are normal. She exhibits no distension and no mass. There is no tenderness. There is no rebound and no guarding.  Musculoskeletal: She exhibits no edema.          Assessment & Plan:  IDDM (insulin dependent diabetes mellitus)  Complex renal cyst - Plan: MR Abdomen W Wo Contrast   Patient's blood pressure is excellent. Her cholesterol is acceptable. However her diabetes is uncontrolled. I have recommended that she add NovoLog 5 units subcutaneously with meals. I would like her checking her two-hour postprandial sugars for the next week and then call me with these values so that I can further titrate NovoLog. I would increase her NovoLog until her two-hour postprandial sugars are less than 160.  Further characterize the mass in her right kidney with a renal protocol MRI.

## 2014-04-29 NOTE — Addendum Note (Signed)
Addended by: Shary Decamp B on: 04/29/2014 04:54 PM   Modules accepted: Orders

## 2014-05-07 ENCOUNTER — Other Ambulatory Visit: Payer: Self-pay | Admitting: Family Medicine

## 2014-05-07 DIAGNOSIS — Q619 Cystic kidney disease, unspecified: Secondary | ICD-10-CM

## 2014-05-07 DIAGNOSIS — N281 Cyst of kidney, acquired: Secondary | ICD-10-CM

## 2014-05-14 ENCOUNTER — Ambulatory Visit
Admission: RE | Admit: 2014-05-14 | Discharge: 2014-05-14 | Disposition: A | Discharge status: Home / Self Care | Payer: Medicare HMO | Source: Ambulatory Visit | Attending: Family Medicine | Admitting: Family Medicine

## 2014-05-14 DIAGNOSIS — N281 Cyst of kidney, acquired: Secondary | ICD-10-CM

## 2014-05-14 MED ORDER — IOHEXOL 300 MG/ML  SOLN
100.0000 mL | Freq: Once | INTRAMUSCULAR | Status: AC | PRN
  Administered 2014-05-14: 100 mL via INTRAVENOUS

## 2014-05-19 ENCOUNTER — Telehealth: Payer: Self-pay | Admitting: Family Medicine

## 2014-05-19 NOTE — Telephone Encounter (Signed)
Patient calling to get results of her ct scan  [phone]

## 2014-05-20 ENCOUNTER — Other Ambulatory Visit: Payer: Self-pay | Admitting: Family Medicine

## 2014-05-20 NOTE — Telephone Encounter (Signed)
Is patient aware of CT results???

## 2014-05-20 NOTE — Telephone Encounter (Signed)
Refill appropriate and filled per protocol. 

## 2014-05-21 NOTE — Telephone Encounter (Signed)
Pt is aware of CT results and also aware of MRI authorizations

## 2014-05-23 ENCOUNTER — Ambulatory Visit
Admission: RE | Admit: 2014-05-23 | Discharge: 2014-05-23 | Disposition: A | Discharge status: Home / Self Care | Payer: Medicare HMO | Source: Ambulatory Visit | Attending: Family Medicine | Admitting: Family Medicine

## 2014-05-23 DIAGNOSIS — N281 Cyst of kidney, acquired: Secondary | ICD-10-CM

## 2014-05-23 MED ORDER — GADOBENATE DIMEGLUMINE 529 MG/ML IV SOLN: 8.0000 mL | Freq: Once | INTRAVENOUS | Status: AC | PRN

## 2014-05-26 ENCOUNTER — Encounter: Payer: Self-pay | Admitting: Family Medicine

## 2014-05-26 ENCOUNTER — Ambulatory Visit (INDEPENDENT_AMBULATORY_CARE_PROVIDER_SITE_OTHER): Payer: Medicare HMO | Admitting: Family Medicine

## 2014-05-26 VITALS — BP 128/76 | HR 72 | Temp 98.7°F | Resp 18 | Ht 64.0 in | Wt 180.0 lb

## 2014-05-26 DIAGNOSIS — M545 Low back pain, unspecified: Secondary | ICD-10-CM

## 2014-05-26 MED ORDER — CYCLOBENZAPRINE HCL 10 MG PO TABS: 10.0000 mg | ORAL_TABLET | Freq: Three times a day (TID) | ORAL | Status: DC | PRN

## 2014-05-26 NOTE — Progress Notes (Signed)
Subjective:    Patient ID: Brenda Moreno, female    DOB: 1946-01-13, 68 y.o.   MRN: [id number]  HPI Patient has been suffering with right sciatica for quite some time.  Her symptoms improved after ESI.  However, beginning this weekend, the patient developed pain and stiffness in her left lower back just superior to the gluteus muscle.  There is no radiation of her pain or symptoms of radiculopathy.  There was no specific injury. Past Medical History  Diagnosis Date  . Leg pain   . Joint pain   . Carotid artery occlusion   . CAD (coronary artery disease)     S/P cabg  . Peripheral vascular disease   . Arthritis   . CHF (congestive heart failure)   . Diabetes mellitus   . Hyperlipidemia   . Hypertension   . Myocardial infarction   . PVD (peripheral vascular disease)    Past Surgical History  Procedure Laterality Date  . Coronary artery bypass graft      1998  . Appendectomy    . Tubal ligation    . Cholecystectomy      Gall bladder  . Pr vein bypass graft,aorto-fem-pop     Current Outpatient Prescriptions on File Prior to Visit  Medication Sig Dispense Refill  . amLODipine (NORVASC) 5 MG tablet TAKE 1 TABLET (5 MG TOTAL) BY MOUTH DAILY.  90 tablet  3  . aspirin EC 81 MG tablet Take 81 mg by mouth daily.        . B-D INS SYR ULTRAFINE 1CC/31G 31G X 5/16" 1 ML MISC USE AS DIRECTED  100 each  6  . B-D ULTRAFINE III SHORT PEN 31G X 8 MM MISC USE 4 TIMES A DAY  100 each  6  . CINNAMON PO Take 1,000 mg by mouth 2 (two) times daily.        . Coenzyme Q10 (CO Q 10 PO) Take 100 mg by mouth daily.        . diphenoxylate-atropine (LOMOTIL) 2.5-0.025 MG per tablet Take 2 tablets by mouth 4 (four) times daily as needed for diarrhea or loose stools.  30 tablet  0  . fluticasone (FLONASE) 50 MCG/ACT nasal spray PLACE 2 SPRAYS INTO BOTH NOSTRILS DAILY.  16 g  2  . insulin aspart (NOVOLOG) 100 UNIT/ML injection 5 units with meals  10 mL  11  . INVOKANA 300 MG TABS TAKE 1 TABLET BY MOUTH  EVERY DAY  30 tablet  3  . LEVEMIR 100 UNIT/ML injection INJECT 0.3MLS (30 UNITS TOTAL) INTO THE SKIN ATBEDTIME  10 mL  3  . LIVALO 4 MG TABS TAKE 1 TABLET BY MOUTH DAILY  90 tablet  2  . metFORMIN (GLUCOPHAGE) 1000 MG tablet TAKE 1 TABLETBY MOUTH TWICE A DAY WITH MEALS  180 tablet  1  . metoprolol succinate (TOPROL-XL) 100 MG 24 hr tablet TAKE 1 TABLET BY MOUTH EVERY DAY  90 tablet  2  . nitroGLYCERIN (NITROSTAT) 0.4 MG SL tablet Place 0.4 mg under the tongue every 5 (five) minutes as needed for chest pain.      . promethazine (PHENERGAN) 12.5 MG tablet Take 1 tablet (12.5 mg total) by mouth every 6 (six) hours as needed for nausea or vomiting.  30 tablet  0  . quinapril (ACCUPRIL) 40 MG tablet TAKE 1 TABLET BY MOUTH TWICE A DAY  180 tablet  1  . traMADol (ULTRAM) 50 MG tablet Take 1 tablet (50 mg total) by  mouth every 6 (six) hours as needed.  60 tablet  1   No current facility-administered medications on file prior to visit.   Allergies  Allergen Reactions  . Levaquin [Levofloxacin In D5w]     Pain all over and in joints  . Statins Other (See Comments)    Severe myalgias to lipitor, crestor, pravastaitin  . Tradjenta [Linagliptin] Itching   History   Social History  . Marital Status: Divorced    Spouse Name: N/A    Number of Children: N/A  . Years of Education: N/A   Occupational History  . Not on file.   Social History Main Topics  . Smoking status: Former Smoker -- 0.25 packs/day    Types: Cigarettes    Quit date: 09/13/2011  . Smokeless tobacco: Never Used  . Alcohol Use: No  . Drug Use: No  . Sexual Activity: Not on file   Other Topics Concern  . Not on file   Social History Narrative  . No narrative on file      Review of Systems  All other systems reviewed and are negative.      Objective:   Physical Exam  Vitals reviewed. Cardiovascular: Normal rate and regular rhythm.   Pulmonary/Chest: Effort normal and breath sounds normal.  Musculoskeletal:        Lumbar back: She exhibits decreased range of motion, tenderness, pain and spasm. She exhibits no bony tenderness.  Neurological: She is alert. She has normal reflexes. She displays normal reflexes. No cranial nerve deficit. She exhibits normal muscle tone. Coordination normal.          Assessment & Plan:  Bilateral low back pain without sciatica - Plan: cyclobenzaprine (FLEXERIL) 10 MG tablet  I suspect muscle strain in lumbar paraspinal muscles.  Try moist heat and flexeril 10 mg poq8 hrs prn.  Anticipate gradual improvement in 1 week.  NTBS if worse.

## 2014-07-02 ENCOUNTER — Ambulatory Visit (INDEPENDENT_AMBULATORY_CARE_PROVIDER_SITE_OTHER): Payer: Medicare HMO | Admitting: Family Medicine

## 2014-07-02 ENCOUNTER — Encounter: Payer: Self-pay | Admitting: Family Medicine

## 2014-07-02 VITALS — BP 130/68 | HR 62 | Temp 98.3°F | Resp 14 | Ht 64.0 in | Wt 184.0 lb

## 2014-07-02 DIAGNOSIS — R197 Diarrhea, unspecified: Secondary | ICD-10-CM

## 2014-07-02 DIAGNOSIS — R159 Full incontinence of feces: Secondary | ICD-10-CM

## 2014-07-02 LAB — CBC WITH DIFFERENTIAL/PLATELET
BASOS ABS: 0.1 10*3/uL (ref 0.0–0.1)
BASOS PCT: 1 % (ref 0–1)
EOS PCT: 2 % (ref 0–5)
Eosinophils Absolute: 0.1 10*3/uL (ref 0.0–0.7)
HCT: 45.3 % (ref 36.0–46.0)
Hemoglobin: 15.3 g/dL — ABNORMAL HIGH (ref 12.0–15.0)
LYMPHS ABS: 2.9 10*3/uL (ref 0.7–4.0)
Lymphocytes Relative: 43 % (ref 12–46)
MCH: 27.5 pg (ref 26.0–34.0)
MCHC: 33.8 g/dL (ref 30.0–36.0)
MCV: 81.3 fL (ref 78.0–100.0)
Monocytes Absolute: 0.7 10*3/uL (ref 0.1–1.0)
Monocytes Relative: 10 % (ref 3–12)
NEUTROS PCT: 44 % (ref 43–77)
Neutro Abs: 2.9 10*3/uL (ref 1.7–7.7)
Platelets: 253 10*3/uL (ref 150–400)
RBC: 5.57 MIL/uL — AB (ref 3.87–5.11)
RDW: 16.3 % — AB (ref 11.5–15.5)
WBC: 6.7 10*3/uL (ref 4.0–10.5)

## 2014-07-02 LAB — COMPREHENSIVE METABOLIC PANEL
ALBUMIN: 3.8 g/dL (ref 3.5–5.2)
ALK PHOS: 82 U/L (ref 39–117)
ALT: 12 U/L (ref 0–35)
AST: 14 U/L (ref 0–37)
BUN: 20 mg/dL (ref 6–23)
CALCIUM: 10 mg/dL (ref 8.4–10.5)
CO2: 23 meq/L (ref 19–32)
Chloride: 109 mEq/L (ref 96–112)
Creat: 1.1 mg/dL (ref 0.50–1.10)
Glucose, Bld: 71 mg/dL (ref 70–99)
Potassium: 3.8 mEq/L (ref 3.5–5.3)
SODIUM: 144 meq/L (ref 135–145)
TOTAL PROTEIN: 6.4 g/dL (ref 6.0–8.3)
Total Bilirubin: 0.4 mg/dL (ref 0.2–1.2)

## 2014-07-02 MED ORDER — INSULIN ASPART 100 UNIT/ML FLEXPEN: 5.0000 [IU] | PEN_INJECTOR | Freq: Three times a day (TID) | SUBCUTANEOUS | Status: DC

## 2014-07-02 NOTE — Assessment & Plan Note (Addendum)
Chronic diarrhea unclear cause whether this is Irritable bowels however she does get some fecal incontinence per she has had multiple accidents. She's not had any spinal cord injuries. She's not been on any recent antibiotics. I'm going to obtain stool cultures on her. I will also check CBC and metabolic panel. I will start her on RESTORA - samples given from office  she has been using Imodium as her insurance would not pay for Lomotil we will also get her an appointment with gastroenterology for further evaluation. I doubt this is due to the metformin she has been stable on this medication for greater than 10 years

## 2014-07-02 NOTE — Progress Notes (Signed)
Patient ID: Brenda Moreno, female   DOB: [birth year], 68 y.o.   MRN: [id number]   Subjective:    Patient ID: Brenda Moreno, female    DOB: Feb 11, 1946, 67 y.o.   MRN: [id number]  Patient presents for IBS and Fragile Nails patient here with worsening diarrhea she's had diarrhea almost daily for the past 4 months. She states that she does not eat out because every time she E she has loose bowels at least 2 or 3 times. She denies any blood in her stools. She did have a colonoscopy in 2008 at that time he states that she had diarrhea at least 3 times a week but there is no evidence of any colitis. She's been on metformin for greater than 10 years  She denies any pain associated denies any nausea vomiting. She does get bloating. She was very concerned that she noticed that her nails were becoming more brittle and one of them actually fell off and she thought this was due to nutritional deficiency from her diarrhea also lost about 12 pounds. Of note she did have a CT scan of her abdomen and pelvis done In September which did not show any disc problems with the bowels she did have a cyst on her right kidney  No new meds, no recent antibiotics Review Of Systems:  GEN- denies fatigue, fever, weight loss,weakness, recent illness HEENT- denies eye drainage, change in vision, nasal discharge, CVS- denies chest pain, palpitations RESP- denies SOB, cough, wheeze ABD- denies N/V, +change in stools, abd pain GU- denies dysuria, hematuria, dribbling, incontinence MSK- denies joint pain, muscle aches, injury Neuro- denies headache, dizziness, syncope, seizure activity       Objective:    BP 130/68 mmHg  Pulse 62  Temp(Src) 98.3 F (36.8 C) (Oral)  Resp 14  Ht 5\' 4"  (1.626 m)  Wt 184 lb (83.462 kg)  BMI 31.57 kg/m2 GEN- NAD, alert and oriented x3 HEENT- PERRL, EOMI, non injected sclera, pink conjunctiva, MMM, oropharynx clear CVS- RRR, no murmur RESP-CTAB ABD-NABS,soft,NT,ND EXT- No edema Pulses-  Radial 2+        Assessment & Plan:      Problem List Items Addressed This Visit    Diarrhea - Primary   Relevant Orders      Stool culture      CBC with Differential      Comprehensive metabolic panel      Fecal Occult Blood, Guaiac      Fecal leukocytes      Clostridium Difficile by PCR      Note: This dictation was prepared with Dragon dictation along with smaller phrase technology. Any transcriptional errors that result from this process are unintentional.

## 2014-07-02 NOTE — Patient Instructions (Addendum)
Bring in stool samples We will call with lab results Take the restora 1 capsule one a day GI referral  F/U pending results

## 2014-07-03 ENCOUNTER — Encounter: Payer: Self-pay | Admitting: Gastroenterology

## 2014-07-03 LAB — CLOSTRIDIUM DIFFICILE BY PCR: Toxigenic C. Difficile by PCR: NOT DETECTED

## 2014-07-03 LAB — FECAL LACTOFERRIN, QUANT: Lactoferrin: NEGATIVE

## 2014-07-06 LAB — STOOL CULTURE

## 2014-08-11 ENCOUNTER — Other Ambulatory Visit: Payer: Self-pay | Admitting: Family Medicine

## 2014-09-02 ENCOUNTER — Ambulatory Visit: Payer: Medicare HMO | Admitting: Gastroenterology

## 2014-09-21 ENCOUNTER — Other Ambulatory Visit: Payer: Self-pay | Admitting: Family Medicine

## 2014-09-22 ENCOUNTER — Other Ambulatory Visit: Payer: Self-pay | Admitting: Family Medicine

## 2014-10-01 ENCOUNTER — Other Ambulatory Visit: Payer: Self-pay

## 2014-10-01 ENCOUNTER — Encounter: Payer: Self-pay | Admitting: Family

## 2014-10-01 DIAGNOSIS — Z1231 Encounter for screening mammogram for malignant neoplasm of breast: Secondary | ICD-10-CM

## 2014-10-02 ENCOUNTER — Other Ambulatory Visit: Payer: Self-pay | Admitting: Vascular Surgery

## 2014-10-02 ENCOUNTER — Ambulatory Visit (INDEPENDENT_AMBULATORY_CARE_PROVIDER_SITE_OTHER): Payer: Medicare Other | Admitting: Family

## 2014-10-02 ENCOUNTER — Ambulatory Visit (HOSPITAL_COMMUNITY)
Admission: RE | Admit: 2014-10-02 | Discharge: 2014-10-02 | Disposition: A | Discharge status: Home / Self Care | Payer: Medicare Other | Source: Ambulatory Visit | Attending: Family | Admitting: Family

## 2014-10-02 ENCOUNTER — Encounter: Payer: Self-pay | Admitting: Family

## 2014-10-02 VITALS — BP 148/82 | HR 50 | Resp 14 | Ht 66.0 in | Wt 178.0 lb

## 2014-10-02 DIAGNOSIS — E1159 Type 2 diabetes mellitus with other circulatory complications: Secondary | ICD-10-CM | POA: Diagnosis not present

## 2014-10-02 DIAGNOSIS — Z48812 Encounter for surgical aftercare following surgery on the circulatory system: Secondary | ICD-10-CM

## 2014-10-02 DIAGNOSIS — I10 Essential (primary) hypertension: Secondary | ICD-10-CM | POA: Insufficient documentation

## 2014-10-02 DIAGNOSIS — I739 Peripheral vascular disease, unspecified: Secondary | ICD-10-CM | POA: Insufficient documentation

## 2014-10-02 DIAGNOSIS — E119 Type 2 diabetes mellitus without complications: Secondary | ICD-10-CM | POA: Insufficient documentation

## 2014-10-02 DIAGNOSIS — Z720 Tobacco use: Secondary | ICD-10-CM | POA: Diagnosis not present

## 2014-10-02 DIAGNOSIS — Z9889 Other specified postprocedural states: Secondary | ICD-10-CM | POA: Diagnosis not present

## 2014-10-02 DIAGNOSIS — E785 Hyperlipidemia, unspecified: Secondary | ICD-10-CM | POA: Diagnosis not present

## 2014-10-02 DIAGNOSIS — F17200 Nicotine dependence, unspecified, uncomplicated: Secondary | ICD-10-CM

## 2014-10-02 DIAGNOSIS — E1151 Type 2 diabetes mellitus with diabetic peripheral angiopathy without gangrene: Secondary | ICD-10-CM

## 2014-10-02 DIAGNOSIS — Z95828 Presence of other vascular implants and grafts: Secondary | ICD-10-CM

## 2014-10-02 NOTE — Patient Instructions (Addendum)
Peripheral Vascular Disease Peripheral Vascular Disease (PVD), also called Peripheral Arterial Disease (PAD), is a circulation problem caused by cholesterol (atherosclerotic plaque) deposits in the arteries. PVD commonly occurs in the lower extremities (legs) but it can occur in other areas of the body, such as your arms. The cholesterol buildup in the arteries reduces blood flow which can cause pain and other serious problems. The presence of PVD can place a person at risk for Coronary Artery Disease (CAD).  CAUSES  Causes of PVD can be many. It is usually associated with more than one risk factor such as:   High Cholesterol.  Smoking.  Diabetes.  Lack of exercise or inactivity.  High blood pressure (hypertension).  Obesity.  Family history. SYMPTOMS   When the lower extremities are affected, patients with PVD may experience:  Leg pain with exertion or physical activity. This is called INTERMITTENT CLAUDICATION. This may present as cramping or numbness with physical activity. The location of the pain is associated with the level of blockage. For example, blockage at the abdominal level (distal abdominal aorta) may result in buttock or hip pain. Lower leg arterial blockage may result in calf pain.  As PVD becomes more severe, pain can develop with less physical activity.  In people with severe PVD, leg pain may occur at rest.  Other PVD signs and symptoms:  Leg numbness or weakness.  Coldness in the affected leg or foot, especially when compared to the other leg.  A change in leg color.  Patients with significant PVD are more prone to ulcers or sores on toes, feet or legs. These may take longer to heal or may reoccur. The ulcers or sores can become infected.  If signs and symptoms of PVD are ignored, gangrene may occur. This can result in the loss of toes or loss of an entire limb.  Not all leg pain is related to PVD. Other medical conditions can cause leg pain such  as:  Blood clots (embolism) or Deep Vein Thrombosis.  Inflammation of the blood vessels (vasculitis).  Spinal stenosis. DIAGNOSIS  Diagnosis of PVD can involve several different types of tests. These can include:  Pulse Volume Recording Method (PVR). This test is simple, painless and does not involve the use of X-rays. PVR involves measuring and comparing the blood pressure in the arms and legs. An ABI (Ankle-Brachial Index) is calculated. The normal ratio of blood pressures is 1. As this number becomes smaller, it indicates more severe disease.  < 0.95 - indicates significant narrowing in one or more leg vessels.  <0.8 - there will usually be pain in the foot, leg or buttock with exercise.  <0.4 - will usually have pain in the legs at rest.  <0.25 - usually indicates limb threatening PVD.  Doppler detection of pulses in the legs. This test is painless and checks to see if you have a pulses in your legs/feet.  A dye or contrast material (a substance that highlights the blood vessels so they show up on x-ray) may be given to help your caregiver better see the arteries for the following tests. The dye is eliminated from your body by the kidney's. Your caregiver may order blood work to check your kidney function and other laboratory values before the following tests are performed:  Magnetic Resonance Angiography (MRA). An MRA is a picture study of the blood vessels and arteries. The MRA machine uses a large magnet to produce images of the blood vessels.  Computed Tomography Angiography (CTA). A CTA   is a specialized x-ray that looks at how the blood flows in your blood vessels. An IV may be inserted into your arm so contrast dye can be injected.  Angiogram. Is a procedure that uses x-rays to look at your blood vessels. This procedure is minimally invasive, meaning a small incision (cut) is made in your groin. A small tube (catheter) is then inserted into the artery of your groin. The catheter  is guided to the blood vessel or artery your caregiver wants to examine. Contrast dye is injected into the catheter. X-rays are then taken of the blood vessel or artery. After the images are obtained, the catheter is taken out. TREATMENT  Treatment of PVD involves many interventions which may include:  Lifestyle changes:  Quitting smoking.  Exercise.  Following a low fat, low cholesterol diet.  Control of diabetes.  Foot care is very important to the PVD patient. Good foot care can help prevent infection.  Medication:  Cholesterol-lowering medicine.  Blood pressure medicine.  Anti-platelet drugs.  Certain medicines may reduce symptoms of Intermittent Claudication.  Interventional/Surgical options:  Angioplasty. An Angioplasty is a procedure that inflates a balloon in the blocked artery. This opens the blocked artery to improve blood flow.  Stent Implant. A wire mesh tube (stent) is placed in the artery. The stent expands and stays in place, allowing the artery to remain open.  Peripheral Bypass Surgery. This is a surgical procedure that reroutes the blood around a blocked artery to help improve blood flow. This type of procedure may be performed if Angioplasty or stent implants are not an option. SEEK IMMEDIATE MEDICAL CARE IF:   You develop pain or numbness in your arms or legs.  Your arm or leg turns cold, becomes blue in color.  You develop redness, warmth, swelling and pain in your arms or legs. MAKE SURE YOU:   Understand these instructions.  Will watch your condition.  Will get help right away if you are not doing well or get worse. Document Released: 09/22/2004 Document Revised: 11/07/2011 Document Reviewed: 08/19/2008 Exit[employer] Patient Information 2015 [employer]. This information is not intended to replace advice given to you by your health care provider. Make sure you discuss any questions you have with your health care provider.    Smoking  Cessation Quitting smoking is important to your health and has many advantages. However, it is not always easy to quit since nicotine is a very addictive drug. Oftentimes, people try 3 times or more before being able to quit. This document explains the best ways for you to prepare to quit smoking. Quitting takes hard work and a lot of effort, but you can do it. ADVANTAGES OF QUITTING SMOKING  You will live longer, feel better, and live better.  Your body will feel the impact of quitting smoking almost immediately.  Within 20 minutes, blood pressure decreases. Your pulse returns to its normal level.  After 8 hours, carbon monoxide levels in the blood return to normal. Your oxygen level increases.  After 24 hours, the chance of having a heart attack starts to decrease. Your breath, hair, and body stop smelling like smoke.  After 48 hours, damaged nerve endings begin to recover. Your sense of taste and smell improve.  After 72 hours, the body is virtually free of nicotine. Your bronchial tubes relax and breathing becomes easier.  After 2 to 12 weeks, lungs can hold more air. Exercise becomes easier and circulation improves.  The risk of having a heart attack, stroke,   cancer, or lung disease is greatly reduced.  After 1 year, the risk of coronary heart disease is cut in half.  After 5 years, the risk of stroke falls to the same as a nonsmoker.  After 10 years, the risk of lung cancer is cut in half and the risk of other cancers decreases significantly.  After 15 years, the risk of coronary heart disease drops, usually to the level of a nonsmoker.  If you are pregnant, quitting smoking will improve your chances of having a healthy baby.  The people you live with, especially any children, will be healthier.  You will have extra money to spend on things other than cigarettes. QUESTIONS TO THINK ABOUT BEFORE ATTEMPTING TO QUIT You may want to talk about your answers with your health care  provider.  Why do you want to quit?  If you tried to quit in the past, what helped and what did not?  What will be the most difficult situations for you after you quit? How will you plan to handle them?  Who can help you through the tough times? Your family? Friends? A health care provider?  What pleasures do you get from smoking? What ways can you still get pleasure if you quit? Here are some questions to ask your health care provider:  How can you help me to be successful at quitting?  What medicine do you think would be best for me and how should I take it?  What should I do if I need more help?  What is smoking withdrawal like? How can I get information on withdrawal? GET READY  Set a quit date.  Change your environment by getting rid of all cigarettes, ashtrays, matches, and lighters in your home, car, or work. Do not let people smoke in your home.  Review your past attempts to quit. Think about what worked and what did not. GET SUPPORT AND ENCOURAGEMENT You have a better chance of being successful if you have help. You can get support in many ways.  Tell your family, friends, and coworkers that you are going to quit and need their support. Ask them not to smoke around you.  Get individual, group, or telephone counseling and support. Programs are available at local hospitals and health centers. Call your local health department for information about programs in your area.  Spiritual beliefs and practices may help some smokers quit.  Download a "quit meter" on your computer to keep track of quit statistics, such as how long you have gone without smoking, cigarettes not smoked, and money saved.  Get a self-help book about quitting smoking and staying off tobacco. LEARN NEW SKILLS AND BEHAVIORS  Distract yourself from urges to smoke. Talk to someone, go for a walk, or occupy your time with a task.  Change your normal routine. Take a different route to work. Drink tea  instead of coffee. Eat breakfast in a different place.  Reduce your stress. Take a hot bath, exercise, or read a book.  Plan something enjoyable to do every day. Reward yourself for not smoking.  Explore interactive web-based programs that specialize in helping you quit. GET MEDICINE AND USE IT CORRECTLY Medicines can help you stop smoking and decrease the urge to smoke. Combining medicine with the above behavioral methods and support can greatly increase your chances of successfully quitting smoking.  Nicotine replacement therapy helps deliver nicotine to your body without the negative effects and risks of smoking. Nicotine replacement therapy includes nicotine gum, lozenges,   inhalers, nasal sprays, and skin patches. Some may be available over-the-counter and others require a prescription.  Antidepressant medicine helps people abstain from smoking, but how this works is unknown. This medicine is available by prescription.  Nicotinic receptor partial agonist medicine simulates the effect of nicotine in your brain. This medicine is available by prescription. Ask your health care provider for advice about which medicines to use and how to use them based on your health history. Your health care provider will tell you what side effects to look out for if you choose to be on a medicine or therapy. Carefully read the information on the package. Do not use any other product containing nicotine while using a nicotine replacement product.  RELAPSE OR DIFFICULT SITUATIONS Most relapses occur within the first 3 months after quitting. Do not be discouraged if you start smoking again. Remember, most people try several times before finally quitting. You may have symptoms of withdrawal because your body is used to nicotine. You may crave cigarettes, be irritable, feel very hungry, cough often, get headaches, or have difficulty concentrating. The withdrawal symptoms are only temporary. They are strongest when you  first quit, but they will go away within 10-14 days. To reduce the chances of relapse, try to:  Avoid drinking alcohol. Drinking lowers your chances of successfully quitting.  Reduce the amount of caffeine you consume. Once you quit smoking, the amount of caffeine in your body increases and can give you symptoms, such as a rapid heartbeat, sweating, and anxiety.  Avoid smokers because they can make you want to smoke.  Do not let weight gain distract you. Many smokers will gain weight when they quit, usually less than 10 pounds. Eat a healthy diet and stay active. You can always lose the weight gained after you quit.  Find ways to improve your mood other than smoking. FOR MORE INFORMATION  www.smokefree.gov  Document Released: 08/09/2001 Document Revised: 12/30/2013 Document Reviewed: 11/24/2011 [employer] Patient Information 2015 [employer]. This information is not intended to replace advice given to you by your health care provider. Make sure you discuss any questions you have with your health care provider.    Smoking Cessation, Tips for Success If you are ready to quit smoking, congratulations! You have chosen to help yourself be healthier. Cigarettes bring nicotine, tar, carbon monoxide, and other irritants into your body. Your lungs, heart, and blood vessels will be able to work better without these poisons. There are many different ways to quit smoking. Nicotine gum, nicotine patches, a nicotine inhaler, or nicotine nasal spray can help with physical craving. Hypnosis, support groups, and medicines help break the habit of smoking. WHAT THINGS CAN I DO TO MAKE QUITTING EASIER?  Here are some tips to help you quit for good:  Pick a date when you will quit smoking completely. Tell all of your friends and family about your plan to quit on that date.  Do not try to slowly cut down on the number of cigarettes you are smoking. Pick a quit date and quit smoking completely starting on that  day.  Throw away all cigarettes.   Clean and remove all ashtrays from your home, work, and car.  On a card, write down your reasons for quitting. Carry the card with you and read it when you get the urge to smoke.  Cleanse your body of nicotine. Drink enough water and fluids to keep your urine clear or pale yellow. Do this after quitting to flush the nicotine from   your body.  Learn to predict your moods. Do not let a bad situation be your excuse to have a cigarette. Some situations in your life might tempt you into wanting a cigarette.  Never have "just one" cigarette. It leads to wanting another and another. Remind yourself of your decision to quit.  Change habits associated with smoking. If you smoked while driving or when feeling stressed, try other activities to replace smoking. Stand up when drinking your coffee. Brush your teeth after eating. Sit in a different chair when you read the paper. Avoid alcohol while trying to quit, and try to drink fewer caffeinated beverages. Alcohol and caffeine may urge you to smoke.  Avoid foods and drinks that can trigger a desire to smoke, such as sugary or spicy foods and alcohol.  Ask people who smoke not to smoke around you.  Have something planned to do right after eating or having a cup of coffee. For example, plan to take a walk or exercise.  Try a relaxation exercise to calm you down and decrease your stress. Remember, you may be tense and nervous for the first 2 weeks after you quit, but this will pass.  Find new activities to keep your hands busy. Play with a pen, coin, or rubber band. Doodle or draw things on paper.  Brush your teeth right after eating. This will help cut down on the craving for the taste of tobacco after meals. You can also try mouthwash.   Use oral substitutes in place of cigarettes. Try using lemon drops, carrots, cinnamon sticks, or chewing gum. Keep them handy so they are available when you have the urge to  smoke.  When you have the urge to smoke, try deep breathing.  Designate your home as a nonsmoking area.  If you are a heavy smoker, ask your health care provider about a prescription for nicotine chewing gum. It can ease your withdrawal from nicotine.  Reward yourself. Set aside the cigarette money you save and buy yourself something nice.  Look for support from others. Join a support group or smoking cessation program. Ask someone at home or at work to help you with your plan to quit smoking.  Always ask yourself, "Do I need this cigarette or is this just a reflex?" Tell yourself, "Today, I choose not to smoke," or "I do not want to smoke." You are reminding yourself of your decision to quit.  Do not replace cigarette smoking with electronic cigarettes (commonly called e-cigarettes). The safety of e-cigarettes is unknown, and some may contain harmful chemicals.  If you relapse, do not give up! Plan ahead and think about what you will do the next time you get the urge to smoke. HOW WILL I FEEL WHEN I QUIT SMOKING? You may have symptoms of withdrawal because your body is used to nicotine (the addictive substance in cigarettes). You may crave cigarettes, be irritable, feel very hungry, cough often, get headaches, or have difficulty concentrating. The withdrawal symptoms are only temporary. They are strongest when you first quit but will go away within 10-14 days. When withdrawal symptoms occur, stay in control. Think about your reasons for quitting. Remind yourself that these are signs that your body is healing and getting used to being without cigarettes. Remember that withdrawal symptoms are easier to treat than the major diseases that smoking can cause.  Even after the withdrawal is over, expect periodic urges to smoke. However, these cravings are generally short lived and will go away whether you   smoke or not. Do not smoke! WHAT RESOURCES ARE AVAILABLE TO HELP ME QUIT SMOKING? Your health care  provider can direct you to community resources or hospitals for support, which may include:  Group support.  Education.  Hypnosis.  Therapy. Document Released: 05/13/2004 Document Revised: 12/30/2013 Document Reviewed: 01/31/2013 [employer] Patient Information 2015 [employer]. This information is not intended to replace advice given to you by your health care provider. Make sure you discuss any questions you have with your health care provider.  

## 2014-10-02 NOTE — Progress Notes (Signed)
Brenda Moreno -PAD  History of Present Illness SHUNTAY EVERETTS is a 69 y.o. female patient of Dr. Oneida Alar who is status post right CIA stent in 2007. She returns today for LE arterial perfusion evaluation. She had an MI in 1998, then 6 vessel CABG. Has moderate claudication in both calves after walking about 20 minutes, relieved by short rest. Pt denies non-healing wounds.  Pt denies any hx of stroke or TIA.  Pt reports New Medical or Surgical History: has what sounds like lumbar HNP, had ESI's which helped to a large degree, recently diagnosed with IBS, had vomiting, diarrhea; this is now under better control with probiotics and eating beans, lost about 20 pounds with this, but has gained most of it back since she is no longer having diarrhea. She has not been working on a graduated walking program recently due to the above 2 issues.   Pt Diabetic: Yes, states in OK control, states uncontrolled for a while, getting under better control Pt smoker: resumed smoking recently  Pt meds include: Statin :Yes, several statins caused legs weakness, is tolerating the current statin Betablocker: Yes ASA: Yes Other anticoagulants/antiplatelets: no   Past Medical History  Diagnosis Date  . Leg pain   . Joint pain   . Carotid artery occlusion   . CAD (coronary artery disease)     S/P cabg  . Peripheral Brenda disease   . Arthritis   . CHF (congestive heart failure)   . Diabetes mellitus   . Hyperlipidemia   . Hypertension   . Myocardial infarction   . PVD (peripheral Brenda disease)     Social History History  Substance Use Topics  . Smoking status: Former Smoker -- 0.25 packs/day    Types: Cigarettes    Quit date: 09/13/2011  . Smokeless tobacco: Never Used  . Alcohol Use: No    Family History Family History  Problem Relation Age of Onset  . Heart disease Mother   . Diabetes Mother   . Hyperlipidemia Mother   .  Hypertension Mother   . Heart disease Father     Heart Disease before age 40  . Diabetes Father   . Hyperlipidemia Father   . Hypertension Father   . Heart disease Sister     heart attack  . Diabetes Sister     Amputation  . Hypertension Sister   . Other Sister     history of amputation  . Heart attack Sister   . Coronary artery disease Other   . Diabetes Brother   . Hypertension Brother   . Heart disease Brother 55    Before age 82  . Hyperlipidemia Brother   . Diabetes Brother     scirrosis of liver    Past Surgical History  Procedure Laterality Date  . Coronary artery bypass graft      1998  . Appendectomy    . Tubal ligation    . Cholecystectomy      Gall bladder  . Pr vein bypass graft,aorto-fem-pop      Allergies  Allergen Reactions  . Levaquin [Levofloxacin In D5w] Other (See Comments)    Pain all over and in joints  . Statins Other (See Comments)    Severe myalgias to lipitor, crestor, pravastatin and weakness and cramping  in bilateral leg.  [name] [Linagliptin] Itching    Current Outpatient Prescriptions  Medication Sig Dispense Refill  . amLODipine (NORVASC) 5 MG tablet TAKE 1 TABLET (  5 MG TOTAL) BY MOUTH DAILY. 90 tablet 3  . aspirin EC 81 MG tablet Take 81 mg by mouth daily.      . B-D ULTRAFINE III SHORT PEN 31G X 8 MM MISC USE 4 TIMES A DAY 100 each 6  . CINNAMON PO Take 1,000 mg by mouth 2 (two) times daily.      . Coenzyme Q10 (CO Q 10 PO) Take 100 mg by mouth daily.      . cyclobenzaprine (FLEXERIL) 10 MG tablet Take 1 tablet (10 mg total) by mouth 3 (three) times daily as needed for muscle spasms. 30 tablet 0  . fluticasone (FLONASE) 50 MCG/ACT nasal spray PLACE 2 SPRAYS INTO BOTH NOSTRILS DAILY. 16 g 2  . insulin aspart (NOVOLOG FLEXPEN) 100 UNIT/ML FlexPen Inject 5 Units into the skin 3 (three) times daily with meals. 15 mL 11  . INVOKANA 300 MG TABS tablet TAKE 1 TABLET BY MOUTH EVERY DAY 30 tablet 3  . Lactobacillus Casei-Folic Acid  (RESTORA RX) 60-1.25 MG CAPS Take by mouth.    Marland Kitchen LEVEMIR 100 UNIT/ML injection INJECT 0.3MLS (30 UNITS TOTAL) INTO THE SKIN ATBEDTIME 10 mL 3  . LIVALO 4 MG TABS TAKE 1 TABLET BY MOUTH DAILY 90 tablet 2  . loperamide (IMODIUM A-D) 2 MG tablet Take 2 mg by mouth 4 (four) times daily as needed for diarrhea or loose stools.    . metFORMIN (GLUCOPHAGE) 1000 MG tablet TAKE 1 TABLETBY MOUTH TWICE A DAY WITH MEALS 60 tablet 5  . metoprolol succinate (TOPROL-XL) 100 MG 24 hr tablet TAKE 1 TABLET BY MOUTH EVERY DAY 90 tablet 2  . nitroGLYCERIN (NITROSTAT) 0.4 MG SL tablet Place 0.4 mg under the tongue every 5 (five) minutes as needed for chest pain.    . promethazine (PHENERGAN) 12.5 MG tablet Take 1 tablet (12.5 mg total) by mouth every 6 (six) hours as needed for nausea or vomiting. 30 tablet 0  . quinapril (ACCUPRIL) 40 MG tablet TAKE 1 TABLET BY MOUTH TWICE A DAY 180 tablet 1  . traMADol (ULTRAM) 50 MG tablet Take 1 tablet (50 mg total) by mouth every 6 (six) hours as needed. 60 tablet 1   No current facility-administered medications for this visit.    ROS: See HPI for pertinent positives and negatives.   Moreno Examination  Filed Vitals:   10/02/14 1223  BP: 148/82  Pulse: 50  Resp: 14  Height: 5\' 6"  (1.676 m)  Weight: 178 lb (80.74 kg)  SpO2: 99%   Body mass index is 28.74 kg/(m^2).  General: A&O x 3, WDWN. Gait: normal Eyes: Pupils equal Pulmonary: CTAB, without wheezes , rales or rhonchi Cardiac: regular Rythm , without detected murmur     Carotid Bruits Left Right   Negative Negative  Aorta: is not palpable Radial pulses: are palpable   Brenda EXAM: Extremities without ischemic changes  without Gangrene; without open wounds.     LE Pulses LEFT RIGHT   FEMORAL not palpable   palpable    POPLITEAL not palpable  not palpable   POSTERIOR TIBIAL not palpable   palpable    DORSALIS PEDIS  ANTERIOR TIBIAL palpable  palpable    Abdomen: soft, NT, no palpable masses. Skin: no rashes, no ulcers noted. Musculoskeletal: no muscle wasting or atrophy. Neurologic: A&O X 3; Appropriate Affect ; SENSATION: normal; MOTOR FUNCTION: moving all extremities equally, motor strength 5/5 throughout. Speech is fluent/normal. CN 2-12 intact.    Non-Invasive Brenda Imaging: DATE: 10/02/2014 ILIAC  ARTERY STENT EVALUATION    INDICATION: Follow-up iliac artery stent    PREVIOUS INTERVENTION(S): Right common iliac artery stent placed in 2007    DUPLEX EXAM:     RIGHT  LEFT   Peak Systolic Velocity (cm/s) Ratio (if abnormal) Waveform  Peak Systolic Velocity (cm/s) Ratio (if abnormal) Waveform     Aorta - Distal     235  B Artery - Proximal to Stent     134  B Stent - Proximal     147  B Stent - Mid     126  B Stent - Distal     175  B Artery - Distal to Stent     .92 Today's ABI / TBI 0.94  1.02 Previous ABI / TBI (09/26/2013 ) 0.97    Waveform:    M - Monophasic       B - Biphasic       T - Triphasic  If Ankle Brachial Index (ABI) or Toe Brachial Index (TBI) performed, please see complete report     ADDITIONAL FINDINGS:     IMPRESSION: 1. Patent stent of the right common iliac artery with mid diffuse disease observed throughout the vessel. 2. Velocities suggest >50% stenosis of the native inflow common iliac artery.    Compared to the previous exam:  Not appropriate due to limited visualization on previous exam.     Outside Studies/Documentation  ASSESSMENT: RAYVON BRANDVOLD is a 69 y.o. female who is status post a right CIA stent in 2007 with mild claudication symptoms in calves.  Today's iliac artery stent Duplex reveals a patent stent of the right common iliac artery with mid diffuse disease observed throughout the  vessel. Velocities suggest >50% stenosis of the native inflow common iliac artery. However, her ABI's are normal and her pedal pulses are palpable.  Unfortunately pt resumed smoking and her DM was uncontrolled for a while with recent exacerbation of IBS and ESI injections in her spine. She states she now feels better and will resume her graduated walking program and try to stop smoking.  Face to face time with patient was 25 minutes. Over 50% of this time was spent on counseling and coordination of care.    PLAN:  Graduated walking program.  The patient was counseled re smoking cessation and given several free resources re smoking cessation.  I discussed in depth with the patient the nature of atherosclerosis, and emphasized the importance of maximal medical management including strict control of blood pressure, blood glucose, and lipid levels, obtaining regular exercise, and cessation of smoking.  The patient is aware that without maximal medical management the underlying atherosclerotic disease process will progress, limiting the benefit of any interventions.  Based on the patient's Brenda studies and examination, pt will return to clinic in 1 year for ABI's and aortoiliac Duplex for right iliac stent evaluation.  The patient was given information about PAD including signs, symptoms, treatment, what symptoms should prompt the patient to seek immediate medical care, and risk reduction measures to take.  Clemon Chambers, RN, MSN, FNP-C [facility] Phone: [phone]  Clinic MD: [facility]  10/02/2014  12:47 PM

## 2014-10-03 ENCOUNTER — Other Ambulatory Visit: Payer: Self-pay | Admitting: *Deleted

## 2014-10-03 DIAGNOSIS — I739 Peripheral vascular disease, unspecified: Secondary | ICD-10-CM

## 2014-10-14 ENCOUNTER — Telehealth: Payer: Self-pay | Admitting: *Deleted

## 2014-10-14 NOTE — Telephone Encounter (Signed)
Received a call from [place] at [facility] stating she is at pts home now and checked her A1C and it was at 8.6 states that have to call provider if over 7 and her glucose is up at 188 and pt states she had to take cough medicine etc and so that's why it was up as well. Pt is not in any distress. Also, pt is needing a refill on her nitrostatin 0.4mg  states prescription has run out in 2012 and would like to have in hand in case needs it. If need to change anything please contact pt at her home number listed.   [facility]

## 2014-10-14 NOTE — Telephone Encounter (Signed)
Needs follow up visit to discuss diabetes.

## 2014-10-15 NOTE — Telephone Encounter (Signed)
Pt aware needs OV to discuss diabetes appt scheduled for Thursday

## 2014-10-16 ENCOUNTER — Ambulatory Visit
Admission: RE | Admit: 2014-10-16 | Discharge: 2014-10-16 | Disposition: A | Discharge status: Home / Self Care | Payer: Medicare Other | Source: Ambulatory Visit

## 2014-10-16 ENCOUNTER — Encounter: Payer: Self-pay | Admitting: Family Medicine

## 2014-10-16 ENCOUNTER — Ambulatory Visit (INDEPENDENT_AMBULATORY_CARE_PROVIDER_SITE_OTHER): Payer: Medicare Other | Admitting: Family Medicine

## 2014-10-16 VITALS — BP 120/60 | HR 64 | Temp 97.8°F | Resp 18 | Ht 64.0 in | Wt 178.0 lb

## 2014-10-16 DIAGNOSIS — IMO0001 Reserved for inherently not codable concepts without codable children: Secondary | ICD-10-CM

## 2014-10-16 DIAGNOSIS — Z794 Long term (current) use of insulin: Secondary | ICD-10-CM

## 2014-10-16 DIAGNOSIS — E119 Type 2 diabetes mellitus without complications: Secondary | ICD-10-CM | POA: Diagnosis not present

## 2014-10-16 DIAGNOSIS — Z1231 Encounter for screening mammogram for malignant neoplasm of breast: Secondary | ICD-10-CM

## 2014-10-16 MED ORDER — PIOGLITAZONE HCL 30 MG PO TABS: 30.0000 mg | ORAL_TABLET | Freq: Every day | ORAL | Status: DC

## 2014-10-16 MED ORDER — NITROGLYCERIN 0.4 MG SL SUBL: 0.4000 mg | SUBLINGUAL_TABLET | SUBLINGUAL | Status: DC | PRN

## 2014-10-16 NOTE — Progress Notes (Signed)
Subjective:    Patient ID: Brenda Moreno, female    DOB: Feb 29, 1946, 69 y.o.   MRN: [id number]  HPI Patient is a very pleasant 69 year old female who is here today to follow-up her diabetes. She recently had her hemoglobin A1c checked by home nurse and was found to be elevated at 8.6. She is currently taking Invokana 300 mg by mouth daily, metformin 1000 mg by mouth twice a day, Levemir 30 units daily, and NovoLog 5 units with meals 3 times a day. She admits that she has not been taking the NovoLog with meals. It is too cumbersome to take it with her. She frequently forgets. Furthermore she is not taking in checking her sugars regularly. Diabetic eye exam is up-to-date. Diabetic foot exam today is performed and is within normal limits. Pneumonia shot is up-to-date. She declines a flu shot. Past Medical History  Diagnosis Date  . Leg pain   . Joint pain   . Carotid artery occlusion   . CAD (coronary artery disease)     S/P cabg  . Peripheral vascular disease   . Arthritis   . CHF (congestive heart failure)   . Diabetes mellitus   . Hyperlipidemia   . Hypertension   . Myocardial infarction   . PVD (peripheral vascular disease)    Past Surgical History  Procedure Laterality Date  . Coronary artery bypass graft      1998  . Appendectomy    . Tubal ligation    . Cholecystectomy      Gall bladder  . Pr vein bypass graft,aorto-fem-pop     Current Outpatient Prescriptions on File Prior to Visit  Medication Sig Dispense Refill  . amLODipine (NORVASC) 5 MG tablet TAKE 1 TABLET (5 MG TOTAL) BY MOUTH DAILY. 90 tablet 3  . aspirin EC 81 MG tablet Take 81 mg by mouth daily.      . B-D ULTRAFINE III SHORT PEN 31G X 8 MM MISC USE 4 TIMES A DAY 100 each 6  . CINNAMON PO Take 1,000 mg by mouth 2 (two) times daily.      . Coenzyme Q10 (CO Q 10 PO) Take 100 mg by mouth daily.      . cyclobenzaprine (FLEXERIL) 10 MG tablet Take 1 tablet (10 mg total) by mouth 3 (three) times daily as needed for  muscle spasms. 30 tablet 0  . fluticasone (FLONASE) 50 MCG/ACT nasal spray PLACE 2 SPRAYS INTO BOTH NOSTRILS DAILY. 16 g 2  . insulin aspart (NOVOLOG FLEXPEN) 100 UNIT/ML FlexPen Inject 5 Units into the skin 3 (three) times daily with meals. 15 mL 11  . INVOKANA 300 MG TABS tablet TAKE 1 TABLET BY MOUTH EVERY DAY 30 tablet 3  . Lactobacillus Casei-Folic Acid (RESTORA RX) 60-1.25 MG CAPS Take by mouth.    Marland Kitchen LEVEMIR 100 UNIT/ML injection INJECT 0.3MLS (30 UNITS TOTAL) INTO THE SKIN ATBEDTIME 10 mL 3  . LIVALO 4 MG TABS TAKE 1 TABLET BY MOUTH DAILY 90 tablet 2  . loperamide (IMODIUM A-D) 2 MG tablet Take 2 mg by mouth 4 (four) times daily as needed for diarrhea or loose stools.    . metFORMIN (GLUCOPHAGE) 1000 MG tablet TAKE 1 TABLETBY MOUTH TWICE A DAY WITH MEALS 60 tablet 5  . metoprolol succinate (TOPROL-XL) 100 MG 24 hr tablet TAKE 1 TABLET BY MOUTH EVERY DAY 90 tablet 2  . nitroGLYCERIN (NITROSTAT) 0.4 MG SL tablet Place 0.4 mg under the tongue every 5 (five) minutes as needed  for chest pain.    . promethazine (PHENERGAN) 12.5 MG tablet Take 1 tablet (12.5 mg total) by mouth every 6 (six) hours as needed for nausea or vomiting. 30 tablet 0  . quinapril (ACCUPRIL) 40 MG tablet TAKE 1 TABLET BY MOUTH TWICE A DAY 180 tablet 1  . traMADol (ULTRAM) 50 MG tablet Take 1 tablet (50 mg total) by mouth every 6 (six) hours as needed. 60 tablet 1   No current facility-administered medications on file prior to visit.   Allergies  Allergen Reactions  . Levaquin [Levofloxacin In D5w] Other (See Comments)    Pain all over and in joints  . Statins Other (See Comments)    Severe myalgias to lipitor, crestor, pravastatin and weakness and cramping  in bilateral leg.  [name] [Linagliptin] Itching   History   Social History  . Marital Status: Divorced    Spouse Name: N/A  . Number of Children: N/A  . Years of Education: N/A   Occupational History  . Not on file.   Social History Main Topics  .  Smoking status: Former Smoker -- 0.25 packs/day    Types: Cigarettes    Quit date: 09/13/2011  . Smokeless tobacco: Never Used  . Alcohol Use: No  . Drug Use: No  . Sexual Activity: Not on file   Other Topics Concern  . Not on file   Social History Narrative      Review of Systems  All other systems reviewed and are negative.      Objective:   Physical Exam  Cardiovascular: Normal rate, regular rhythm, normal heart sounds and intact distal pulses.   No murmur heard. Pulmonary/Chest: Effort normal and breath sounds normal. No respiratory distress. She has no wheezes. She has no rales. She exhibits no tenderness.  Abdominal: Soft. Bowel sounds are normal. She exhibits no distension and no mass. There is no tenderness. There is no rebound and no guarding.  Vitals reviewed.         Assessment & Plan:  IDDM (insulin dependent diabetes mellitus) - Plan: pioglitazone (ACTOS) 30 MG tablet, Microalbumin, urine, BASIC METABOLIC PANEL WITH GFR  I will check a microalbumin today as well as a BMP. Blood pressure is excellent. Patient is up-to-date with her immunizations. Diabetic eye exam was performed. Diabetic foot exam is performed. Given the patient's problems with compliance, I recommended we discontinue NovoLog and start the patient on Actos 30 mg by mouth daily. Recheck hemoglobin A1c in 3 months. At that time I like to perform a fasting lipid panel. Goal LDL cholesterol is less than 70. Patient also is requesting a low back brace. She has a history of degenerative disc disease. She also has a history of sciatica. She receives periodic steroid injections in her back due to sciatica. She believes that with a back brace providing extra-support, she may be able to better function and not require as much pain medication such as tramadol and less frequent injections.

## 2014-10-16 NOTE — Addendum Note (Signed)
Addended by: Shary Decamp B on: 10/16/2014 03:21 PM   Modules accepted: Orders

## 2014-10-17 LAB — BASIC METABOLIC PANEL WITH GFR
BUN: 21 mg/dL (ref 6–23)
CO2: 27 mEq/L (ref 19–32)
Calcium: 10.1 mg/dL (ref 8.4–10.5)
Chloride: 102 mEq/L (ref 96–112)
Creat: 1.16 mg/dL — ABNORMAL HIGH (ref 0.50–1.10)
GFR, EST AFRICAN AMERICAN: 57 mL/min — AB
GFR, EST NON AFRICAN AMERICAN: 49 mL/min — AB
Glucose, Bld: 225 mg/dL — ABNORMAL HIGH (ref 70–99)
POTASSIUM: 4.1 meq/L (ref 3.5–5.3)
Sodium: 141 mEq/L (ref 135–145)

## 2014-10-17 LAB — MICROALBUMIN, URINE: Microalb, Ur: 14.8 mg/dL — ABNORMAL HIGH (ref <2.0)

## 2014-10-21 ENCOUNTER — Other Ambulatory Visit: Payer: Self-pay | Admitting: Family Medicine

## 2014-10-21 DIAGNOSIS — R928 Other abnormal and inconclusive findings on diagnostic imaging of breast: Secondary | ICD-10-CM

## 2014-10-28 ENCOUNTER — Ambulatory Visit
Admission: RE | Admit: 2014-10-28 | Discharge: 2014-10-28 | Disposition: A | Discharge status: Home / Self Care | Payer: Medicare Other | Source: Ambulatory Visit | Attending: Family Medicine | Admitting: Family Medicine

## 2014-10-28 DIAGNOSIS — R928 Other abnormal and inconclusive findings on diagnostic imaging of breast: Secondary | ICD-10-CM

## 2014-10-28 DIAGNOSIS — N6002 Solitary cyst of left breast: Secondary | ICD-10-CM | POA: Diagnosis not present

## 2014-11-03 ENCOUNTER — Other Ambulatory Visit: Payer: Self-pay | Admitting: Family Medicine

## 2014-11-05 ENCOUNTER — Telehealth: Payer: Self-pay | Admitting: Family Medicine

## 2014-11-05 NOTE — Telephone Encounter (Signed)
Received order and need for OV notes for DM shoes for pt from [employer] [facility] - OV notes faxed to [phone] and received confirmation of receivable notes. Will send order to scan.

## 2014-11-12 ENCOUNTER — Other Ambulatory Visit: Payer: Self-pay | Admitting: Family Medicine

## 2014-11-12 DIAGNOSIS — Z794 Long term (current) use of insulin: Principal | ICD-10-CM

## 2014-11-12 DIAGNOSIS — IMO0001 Reserved for inherently not codable concepts without codable children: Secondary | ICD-10-CM

## 2014-11-12 DIAGNOSIS — E119 Type 2 diabetes mellitus without complications: Principal | ICD-10-CM

## 2014-11-12 MED ORDER — METFORMIN HCL 1000 MG PO TABS: ORAL_TABLET | ORAL | Status: DC

## 2014-11-12 MED ORDER — PITAVASTATIN CALCIUM 4 MG PO TABS: 1.0000 | ORAL_TABLET | Freq: Every day | ORAL | Status: DC

## 2014-11-12 MED ORDER — CANAGLIFLOZIN 300 MG PO TABS: 300.0000 mg | ORAL_TABLET | Freq: Every day | ORAL | Status: DC

## 2014-11-12 MED ORDER — QUINAPRIL HCL 40 MG PO TABS: 40.0000 mg | ORAL_TABLET | Freq: Two times a day (BID) | ORAL | Status: DC

## 2014-11-12 MED ORDER — PIOGLITAZONE HCL 30 MG PO TABS: 30.0000 mg | ORAL_TABLET | Freq: Every day | ORAL | Status: DC

## 2014-11-12 MED ORDER — NITROGLYCERIN 0.4 MG SL SUBL: 0.4000 mg | SUBLINGUAL_TABLET | SUBLINGUAL | Status: DC | PRN

## 2014-11-12 MED ORDER — AMLODIPINE BESYLATE 5 MG PO TABS: ORAL_TABLET | ORAL | Status: DC

## 2014-11-12 NOTE — Telephone Encounter (Signed)
Med sent to pharm 

## 2014-11-20 ENCOUNTER — Other Ambulatory Visit: Payer: Self-pay | Admitting: Family Medicine

## 2014-11-20 NOTE — Telephone Encounter (Signed)
Refill appropriate and filled per protocol. 

## 2014-11-27 ENCOUNTER — Telehealth: Payer: Self-pay | Admitting: Family Medicine

## 2014-11-27 NOTE — Telephone Encounter (Signed)
Pt has changed insurance and medication has not been sent to them for approval.  PA sent to [facility] for [employer] plan  Case [id number]

## 2014-11-27 NOTE — Telephone Encounter (Signed)
I called pt.  Cost is around $200 because insurance is not covering at all.  She said pharmacy said it needs PA.  Will check to see if PA has been done (do not see one in system)  If not will do.  If has been denied will ask provider for recommendations.

## 2014-11-27 NOTE — Telephone Encounter (Signed)
livalo 4mg  insurance would not cover this med for her and she is allergic to all others she said would like a call back as to what to do about this  [phone]   [facility] [address]

## 2014-11-28 MED ORDER — PITAVASTATIN CALCIUM 4 MG PO TABS: 1.0000 | ORAL_TABLET | Freq: Every day | ORAL | Status: DC

## 2014-11-28 NOTE — Telephone Encounter (Signed)
Med sent to pharm with approval and pt aware

## 2014-12-01 ENCOUNTER — Other Ambulatory Visit: Payer: Self-pay | Admitting: Family Medicine

## 2014-12-02 ENCOUNTER — Telehealth: Payer: Self-pay | Admitting: Family Medicine

## 2014-12-02 MED ORDER — INSULIN DETEMIR 100 UNIT/ML FLEXPEN: 30.0000 [IU] | PEN_INJECTOR | Freq: Every day | SUBCUTANEOUS | Status: DC

## 2014-12-02 NOTE — Telephone Encounter (Signed)
Medication refilled per protocol. 

## 2015-01-01 ENCOUNTER — Other Ambulatory Visit: Payer: Self-pay | Admitting: Family Medicine

## 2015-01-01 DIAGNOSIS — E119 Type 2 diabetes mellitus without complications: Secondary | ICD-10-CM | POA: Diagnosis not present

## 2015-01-08 DIAGNOSIS — H43811 Vitreous degeneration, right eye: Secondary | ICD-10-CM | POA: Diagnosis not present

## 2015-01-08 DIAGNOSIS — E119 Type 2 diabetes mellitus without complications: Secondary | ICD-10-CM | POA: Diagnosis not present

## 2015-01-08 DIAGNOSIS — H43813 Vitreous degeneration, bilateral: Secondary | ICD-10-CM | POA: Diagnosis not present

## 2015-01-08 DIAGNOSIS — H25813 Combined forms of age-related cataract, bilateral: Secondary | ICD-10-CM | POA: Diagnosis not present

## 2015-01-08 LAB — HM DIABETES EYE EXAM

## 2015-01-15 ENCOUNTER — Telehealth: Payer: Self-pay | Admitting: Family Medicine

## 2015-01-15 ENCOUNTER — Ambulatory Visit (INDEPENDENT_AMBULATORY_CARE_PROVIDER_SITE_OTHER): Payer: Medicare Other | Admitting: Family Medicine

## 2015-01-15 ENCOUNTER — Encounter: Payer: Self-pay | Admitting: Family Medicine

## 2015-01-15 VITALS — BP 110/60 | HR 68 | Temp 97.9°F | Resp 18 | Ht 64.0 in | Wt 181.0 lb

## 2015-01-15 DIAGNOSIS — I10 Essential (primary) hypertension: Secondary | ICD-10-CM | POA: Diagnosis not present

## 2015-01-15 DIAGNOSIS — IMO0002 Reserved for concepts with insufficient information to code with codable children: Secondary | ICD-10-CM

## 2015-01-15 DIAGNOSIS — Z0000 Encounter for general adult medical examination without abnormal findings: Secondary | ICD-10-CM | POA: Diagnosis not present

## 2015-01-15 DIAGNOSIS — E785 Hyperlipidemia, unspecified: Secondary | ICD-10-CM | POA: Diagnosis not present

## 2015-01-15 DIAGNOSIS — Z23 Encounter for immunization: Secondary | ICD-10-CM

## 2015-01-15 DIAGNOSIS — E1165 Type 2 diabetes mellitus with hyperglycemia: Secondary | ICD-10-CM

## 2015-01-15 DIAGNOSIS — M5136 Other intervertebral disc degeneration, lumbar region: Secondary | ICD-10-CM | POA: Insufficient documentation

## 2015-01-15 LAB — COMPLETE METABOLIC PANEL WITH GFR
ALBUMIN: 4.3 g/dL (ref 3.5–5.2)
ALK PHOS: 73 U/L (ref 39–117)
ALT: 15 U/L (ref 0–35)
AST: 17 U/L (ref 0–37)
BILIRUBIN TOTAL: 0.5 mg/dL (ref 0.2–1.2)
BUN: 27 mg/dL — ABNORMAL HIGH (ref 6–23)
CO2: 24 mEq/L (ref 19–32)
Calcium: 10 mg/dL (ref 8.4–10.5)
Chloride: 109 mEq/L (ref 96–112)
Creat: 1.48 mg/dL — ABNORMAL HIGH (ref 0.50–1.10)
GFR, EST NON AFRICAN AMERICAN: 37 mL/min — AB
GFR, Est African American: 42 mL/min — ABNORMAL LOW
Glucose, Bld: 72 mg/dL (ref 70–99)
POTASSIUM: 3.9 meq/L (ref 3.5–5.3)
SODIUM: 143 meq/L (ref 135–145)
TOTAL PROTEIN: 7 g/dL (ref 6.0–8.3)

## 2015-01-15 LAB — CBC WITH DIFFERENTIAL/PLATELET
BASOS PCT: 1 % (ref 0–1)
Basophils Absolute: 0.1 10*3/uL (ref 0.0–0.1)
EOS ABS: 0.1 10*3/uL (ref 0.0–0.7)
EOS PCT: 1 % (ref 0–5)
HCT: 45.7 % (ref 36.0–46.0)
Hemoglobin: 15.8 g/dL — ABNORMAL HIGH (ref 12.0–15.0)
Lymphocytes Relative: 45 % (ref 12–46)
Lymphs Abs: 2.8 10*3/uL (ref 0.7–4.0)
MCH: 28.1 pg (ref 26.0–34.0)
MCHC: 34.6 g/dL (ref 30.0–36.0)
MCV: 81.2 fL (ref 78.0–100.0)
MONOS PCT: 10 % (ref 3–12)
MPV: 10.5 fL (ref 8.6–12.4)
Monocytes Absolute: 0.6 10*3/uL (ref 0.1–1.0)
NEUTROS PCT: 43 % (ref 43–77)
Neutro Abs: 2.7 10*3/uL (ref 1.7–7.7)
PLATELETS: 263 10*3/uL (ref 150–400)
RBC: 5.63 MIL/uL — ABNORMAL HIGH (ref 3.87–5.11)
RDW: 16.8 % — AB (ref 11.5–15.5)
WBC: 6.3 10*3/uL (ref 4.0–10.5)

## 2015-01-15 LAB — LIPID PANEL
Cholesterol: 145 mg/dL (ref 0–200)
HDL: 44 mg/dL — ABNORMAL LOW (ref >=46)
LDL Cholesterol: 65 mg/dL (ref 0–99)
TRIGLYCERIDES: 178 mg/dL — AB (ref <150)
Total CHOL/HDL Ratio: 3.3 Ratio
VLDL: 36 mg/dL (ref 0–40)

## 2015-01-15 LAB — MICROALBUMIN, URINE: MICROALB UR: 3.1 mg/dL — AB (ref <2.0)

## 2015-01-15 LAB — HEMOGLOBIN A1C
HEMOGLOBIN A1C: 8.4 % — AB (ref <5.7)
Mean Plasma Glucose: 194 mg/dL — ABNORMAL HIGH (ref <117)

## 2015-01-15 NOTE — Telephone Encounter (Signed)
Patient would like results of her a1c as soon as it is available so she will know how to take her meds  [phone]

## 2015-01-15 NOTE — Progress Notes (Signed)
Subjective:    Patient ID: Brenda Moreno, female    DOB: Dec 09, 1945, 69 y.o.   MRN: [id number]  HPI  My plan at her last OV in 2/16 was: IDDM (insulin dependent diabetes mellitus) - Plan: pioglitazone (ACTOS) 30 MG tablet, Microalbumin, urine, BASIC METABOLIC PANEL WITH GFR  I will check a microalbumin today as well as a BMP. Blood pressure is excellent. Patient is up-to-date with her immunizations. Diabetic eye exam was performed. Diabetic foot exam is performed. Given the patient's problems with compliance, I recommended we discontinue NovoLog and start the patient on Actos 30 mg by mouth daily. Recheck hemoglobin A1c in 3 months. At that time I like to perform a fasting lipid panel. Goal LDL cholesterol is less than 70. Patient also is requesting a low back brace. She has a history of degenerative disc disease. She also has a history of sciatica. She receives periodic steroid injections in her back due to sciatica. She believes that with a back brace providing extra-support, she may be able to better function and not require as much pain medication such as tramadol and less frequent injections.  She is here today for CPE.  Last mammogram 10/2014.  Last colonoscopy 2008.  Prevnar and zostavax are UTD.  Due for pneumovax 23.  Patient's fasting blood sugars are less than 130. Unfortunately her two-hour postprandial sugars are still close to 200. She is occasionally having hypoglycemic episodes in the middle of the night. She has been taking her insulin at night. Her Pap smear was performed last year and is up-to-date. Past Medical History  Diagnosis Date  . Leg pain   . Joint pain   . Carotid artery occlusion   . CAD (coronary artery disease)     S/P cabg  . Peripheral vascular disease   . Arthritis   . CHF (congestive heart failure)   . Diabetes mellitus   . Hyperlipidemia   . Hypertension   . Myocardial infarction   . PVD (peripheral vascular disease)    Past Surgical History    Procedure Laterality Date  . Coronary artery bypass graft      1998  . Appendectomy    . Tubal ligation    . Cholecystectomy      Gall bladder  . Pr vein bypass graft,aorto-fem-pop     Current Outpatient Prescriptions on File Prior to Visit  Medication Sig Dispense Refill  . amLODipine (NORVASC) 5 MG tablet TAKE 1 TABLET (5 MG TOTAL) BY MOUTH DAILY. 90 tablet 3  . aspirin EC 81 MG tablet Take 81 mg by mouth daily.      . B-D ULTRAFINE III SHORT PEN 31G X 8 MM MISC USE 4 TIMES A DAY 100 each 6  . canagliflozin (INVOKANA) 300 MG TABS tablet Take 300 mg by mouth daily. 90 tablet 3  . CINNAMON PO Take 1,000 mg by mouth 2 (two) times daily.      . Coenzyme Q10 (CO Q 10 PO) Take 100 mg by mouth daily.      . cyclobenzaprine (FLEXERIL) 10 MG tablet Take 1 tablet (10 mg total) by mouth 3 (three) times daily as needed for muscle spasms. 30 tablet 0  . fluticasone (FLONASE) 50 MCG/ACT nasal spray PLACE 2 SPRAYS INTO BOTH NOSTRILS DAILY. 16 g 2  . insulin aspart (NOVOLOG FLEXPEN) 100 UNIT/ML FlexPen Inject 5 Units into the skin 3 (three) times daily with meals. 15 mL 11  . Insulin Detemir (LEVEMIR FLEXTOUCH) 100 UNIT/ML Pen Inject  30 Units into the skin daily at 10 pm. 45 mL 3  . INVOKANA 300 MG TABS tablet TAKE 1 TABLET BY MOUTH EVERY DAY 30 tablet 3  . Lactobacillus Casei-Folic Acid (RESTORA RX) 60-1.25 MG CAPS Take by mouth.    . loperamide (IMODIUM A-D) 2 MG tablet Take 2 mg by mouth 4 (four) times daily as needed for diarrhea or loose stools.    . metFORMIN (GLUCOPHAGE) 1000 MG tablet TAKE 1 TABLETBY MOUTH TWICE A DAY WITH MEALS 180 tablet 3  . metoprolol succinate (TOPROL-XL) 100 MG 24 hr tablet TAKE 1 TABLET BY MOUTH EVERY DAY 90 tablet 2  . nitroGLYCERIN (NITROSTAT) 0.4 MG SL tablet Place 1 tablet (0.4 mg total) under the tongue every 5 (five) minutes as needed for chest pain. 90 tablet 2  . pioglitazone (ACTOS) 30 MG tablet Take 1 tablet (30 mg total) by mouth daily. 30 tablet 5  .  Pitavastatin Calcium (LIVALO) 4 MG TABS Take 1 tablet (4 mg total) by mouth daily. 90 tablet 3  . promethazine (PHENERGAN) 12.5 MG tablet Take 1 tablet (12.5 mg total) by mouth every 6 (six) hours as needed for nausea or vomiting. 30 tablet 0  . quinapril (ACCUPRIL) 40 MG tablet Take 1 tablet (40 mg total) by mouth 2 (two) times daily. 180 tablet 1  . quinapril (ACCUPRIL) 40 MG tablet TAKE 1 TABLET BY MOUTH TWICE A DAY 180 tablet 1  . traMADol (ULTRAM) 50 MG tablet Take 1 tablet (50 mg total) by mouth every 6 (six) hours as needed. 60 tablet 1   No current facility-administered medications on file prior to visit.   Allergies  Allergen Reactions  . Levaquin [Levofloxacin In D5w] Other (See Comments)    Pain all over and in joints  . Statins Other (See Comments)    Severe myalgias to lipitor, crestor, pravastatin and weakness and cramping  in bilateral leg.  [name] [Linagliptin] Itching   History   Social History  . Marital Status: Divorced    Spouse Name: N/A  . Number of Children: N/A  . Years of Education: N/A   Occupational History  . Not on file.   Social History Main Topics  . Smoking status: Former Smoker -- 0.25 packs/day    Types: Cigarettes    Quit date: 09/13/2011  . Smokeless tobacco: Never Used  . Alcohol Use: No  . Drug Use: No  . Sexual Activity: Not on file   Other Topics Concern  . Not on file   Social History Narrative   Family History  Problem Relation Age of Onset  . Heart disease Mother   . Diabetes Mother   . Hyperlipidemia Mother   . Hypertension Mother   . Heart disease Father     Heart Disease before age 16  . Diabetes Father   . Hyperlipidemia Father   . Hypertension Father   . Heart disease Sister     heart attack  . Diabetes Sister     Amputation  . Hypertension Sister   . Other Sister     history of amputation  . Heart attack Sister   . Coronary artery disease Other   . Diabetes Brother   . Hypertension Brother   . Heart  disease Brother 41    Before age 25  . Hyperlipidemia Brother   . Diabetes Brother     scirrosis of liver     Review of Systems  All other systems reviewed and are negative.  Objective:   Physical Exam  Constitutional: She is oriented to person, place, and time. She appears well-developed and well-nourished. No distress.  HENT:  Head: Normocephalic and atraumatic.  Right Ear: External ear normal.  Left Ear: External ear normal.  Nose: Nose normal.  Mouth/Throat: Oropharynx is clear and moist. No oropharyngeal exudate.  Eyes: Conjunctivae and EOM are normal. Pupils are equal, round, and reactive to light. Right eye exhibits no discharge. Left eye exhibits no discharge. No scleral icterus.  Neck: Normal range of motion. Neck supple. No JVD present. No tracheal deviation present. No thyromegaly present.  Cardiovascular: Normal rate, regular rhythm, normal heart sounds and intact distal pulses.  Exam reveals no gallop and no friction rub.   No murmur heard. Pulmonary/Chest: Effort normal and breath sounds normal. No respiratory distress. She has no wheezes. She has no rales. She exhibits no tenderness.  Abdominal: Soft. Bowel sounds are normal. She exhibits no distension and no mass. There is no tenderness. There is no rebound and no guarding.  Musculoskeletal: Normal range of motion. She exhibits no edema or tenderness.  Lymphadenopathy:    She has no cervical adenopathy.  Neurological: She is alert and oriented to person, place, and time. She has normal reflexes. She displays normal reflexes. No cranial nerve deficit. She exhibits normal muscle tone. Coordination normal.  Skin: Skin is warm. No rash noted. She is not diaphoretic. No erythema. No pallor.  Psychiatric: She has a normal mood and affect. Her behavior is normal. Judgment and thought content normal.  Vitals reviewed.         Assessment & Plan:  Routine general medical examination at a health care  facility  Diabetes mellitus type II, uncontrolled - Plan: CBC with Differential/Platelet, COMPLETE METABOLIC PANEL WITH GFR, Lipid panel, Hemoglobin A1c, Microalbumin, urine  Benign essential HTN  HLD (hyperlipidemia)  Patient's blood pressure is excellent. Mammogram, colonoscopy, Pap smear all up-to-date. Patient received Pneumovax 23 today in clinic. I will check a CBC, CMP, fasting lipid panel, and hemoglobin A1c along with urine microalbumin. If hemoglobin A1c is still elevated, I will consider adding NovoLog 5-7 units with supper. I also recommended the patient try to start exercising more throughout the day to help decrease her sugar values. The remainder of her preventative care is up-to-date. Diabetic eye exam was just performed and is normal.

## 2015-01-15 NOTE — Addendum Note (Signed)
Addended by: Shary Decamp B on: 01/15/2015 10:55 AM   Modules accepted: Orders

## 2015-01-16 MED ORDER — INSULIN ASPART 100 UNIT/ML FLEXPEN
PEN_INJECTOR | SUBCUTANEOUS | Status: DC
Start: 2015-01-16 — End: 2016-05-03

## 2015-01-16 NOTE — Telephone Encounter (Signed)
Patient aware of results.

## 2015-01-19 ENCOUNTER — Other Ambulatory Visit: Payer: Self-pay | Admitting: Family Medicine

## 2015-01-19 DIAGNOSIS — E119 Type 2 diabetes mellitus without complications: Secondary | ICD-10-CM | POA: Diagnosis not present

## 2015-01-22 ENCOUNTER — Encounter: Payer: Self-pay | Admitting: Family Medicine

## 2015-03-21 ENCOUNTER — Emergency Department (HOSPITAL_COMMUNITY)
Admission: EM | Admit: 2015-03-21 | Discharge: 2015-03-21 | Disposition: A | Discharge status: Home / Self Care | Payer: Medicare Other | Attending: Emergency Medicine | Admitting: Emergency Medicine

## 2015-03-21 ENCOUNTER — Encounter (HOSPITAL_COMMUNITY): Payer: Self-pay | Admitting: Emergency Medicine

## 2015-03-21 DIAGNOSIS — I252 Old myocardial infarction: Secondary | ICD-10-CM | POA: Diagnosis not present

## 2015-03-21 DIAGNOSIS — I809 Phlebitis and thrombophlebitis of unspecified site: Secondary | ICD-10-CM

## 2015-03-21 DIAGNOSIS — Z7951 Long term (current) use of inhaled steroids: Secondary | ICD-10-CM | POA: Diagnosis not present

## 2015-03-21 DIAGNOSIS — Z7952 Long term (current) use of systemic steroids: Secondary | ICD-10-CM | POA: Diagnosis not present

## 2015-03-21 DIAGNOSIS — Z9861 Coronary angioplasty status: Secondary | ICD-10-CM | POA: Insufficient documentation

## 2015-03-21 DIAGNOSIS — M75102 Unspecified rotator cuff tear or rupture of left shoulder, not specified as traumatic: Secondary | ICD-10-CM | POA: Insufficient documentation

## 2015-03-21 DIAGNOSIS — Z7982 Long term (current) use of aspirin: Secondary | ICD-10-CM | POA: Insufficient documentation

## 2015-03-21 DIAGNOSIS — E785 Hyperlipidemia, unspecified: Secondary | ICD-10-CM | POA: Diagnosis not present

## 2015-03-21 DIAGNOSIS — I10 Essential (primary) hypertension: Secondary | ICD-10-CM | POA: Insufficient documentation

## 2015-03-21 DIAGNOSIS — M1990 Unspecified osteoarthritis, unspecified site: Secondary | ICD-10-CM | POA: Insufficient documentation

## 2015-03-21 DIAGNOSIS — Z87891 Personal history of nicotine dependence: Secondary | ICD-10-CM | POA: Insufficient documentation

## 2015-03-21 DIAGNOSIS — I509 Heart failure, unspecified: Secondary | ICD-10-CM | POA: Diagnosis not present

## 2015-03-21 DIAGNOSIS — I8002 Phlebitis and thrombophlebitis of superficial vessels of left lower extremity: Secondary | ICD-10-CM | POA: Diagnosis not present

## 2015-03-21 DIAGNOSIS — E119 Type 2 diabetes mellitus without complications: Secondary | ICD-10-CM | POA: Insufficient documentation

## 2015-03-21 DIAGNOSIS — M79605 Pain in left leg: Secondary | ICD-10-CM | POA: Diagnosis present

## 2015-03-21 DIAGNOSIS — M75100 Unspecified rotator cuff tear or rupture of unspecified shoulder, not specified as traumatic: Secondary | ICD-10-CM | POA: Diagnosis not present

## 2015-03-21 DIAGNOSIS — M5442 Lumbago with sciatica, left side: Secondary | ICD-10-CM | POA: Diagnosis not present

## 2015-03-21 DIAGNOSIS — Z79899 Other long term (current) drug therapy: Secondary | ICD-10-CM | POA: Insufficient documentation

## 2015-03-21 DIAGNOSIS — M5432 Sciatica, left side: Secondary | ICD-10-CM

## 2015-03-21 DIAGNOSIS — I2510 Atherosclerotic heart disease of native coronary artery without angina pectoris: Secondary | ICD-10-CM | POA: Diagnosis not present

## 2015-03-21 DIAGNOSIS — Z794 Long term (current) use of insulin: Secondary | ICD-10-CM | POA: Diagnosis not present

## 2015-03-21 DIAGNOSIS — M7582 Other shoulder lesions, left shoulder: Secondary | ICD-10-CM

## 2015-03-21 MED ORDER — PREDNISONE 20 MG PO TABS: 20.0000 mg | ORAL_TABLET | Freq: Every day | ORAL | Status: DC

## 2015-03-21 MED ORDER — NAPROXEN 500 MG PO TABS: 500.0000 mg | ORAL_TABLET | Freq: Two times a day (BID) | ORAL | Status: DC

## 2015-03-21 MED ORDER — HYDROCODONE-ACETAMINOPHEN 5-325 MG PO TABS: 2.0000 | ORAL_TABLET | ORAL | Status: DC | PRN

## 2015-03-21 MED ORDER — HYDROCODONE-ACETAMINOPHEN 5-325 MG PO TABS
1.0000 | ORAL_TABLET | Freq: Once | ORAL | Status: AC
  Administered 2015-03-21: 1 via ORAL
  Filled 2015-03-21: qty 1

## 2015-03-21 MED ORDER — PREDNISONE 20 MG PO TABS
60.0000 mg | ORAL_TABLET | Freq: Once | ORAL | Status: AC
  Administered 2015-03-21: 60 mg via ORAL
  Filled 2015-03-21: qty 3

## 2015-03-21 NOTE — ED Notes (Signed)
Pt has a ride home.  

## 2015-03-21 NOTE — ED Provider Notes (Signed)
CSN: [id number]     Arrival date & time 03/21/15  1903 History  This chart was scribed for Brenda Furry, MD by Mercy Moore, ED scribe.  This patient was seen in room [place] and the patient's care was started at 7:26 PM.   Chief Complaint  Patient presents with  . Leg Pain  . Back Pain   The history is provided by the patient. No language interpreter was used.   HPI Comments: Brenda Moreno is a 69 y.o. female with PMHx inlcuding leg pain, joint pain, arthritis, and lumbar DDD who presents to the Emergency Department complaining of lower back pain with radiation into left leg for 2-3 days now. Patient describes left lower leg pain, swelling and 'knot" to lateral leg. Patient reports numbness in left lower leg and sharp, shooting pain extending from left lower back especially with standing. Patient is ambulatory and states that she has been trying not to limp due to pain severity.  Patient secondarily complains of pain in left shoulder exacerbated with movement.  Patient with history of DM for which she takes insulin and Metformin.  PCP: Juluis Pitch   Past Medical History  Diagnosis Date  . Leg pain   . Joint pain   . Carotid artery occlusion   . CAD (coronary artery disease)     S/P cabg  . Peripheral vascular disease   . Arthritis   . CHF (congestive heart failure)   . Diabetes mellitus   . Hyperlipidemia   . Hypertension   . Myocardial infarction   . PVD (peripheral vascular disease)   . DDD (degenerative disc disease), lumbar    Past Surgical History  Procedure Laterality Date  . Coronary artery bypass graft      1998  . Appendectomy    . Tubal ligation    . Cholecystectomy      Gall bladder  . Pr vein bypass graft,aorto-fem-pop     Family History  Problem Relation Age of Onset  . Heart disease Mother   . Diabetes Mother   . Hyperlipidemia Mother   . Hypertension Mother   . Heart disease Father     Heart Disease before age 91  . Diabetes Father   .  Hyperlipidemia Father   . Hypertension Father   . Heart disease Sister     heart attack  . Diabetes Sister     Amputation  . Hypertension Sister   . Other Sister     history of amputation  . Heart attack Sister   . Coronary artery disease Other   . Diabetes Brother   . Hypertension Brother   . Heart disease Brother 66    Before age 15  . Hyperlipidemia Brother   . Diabetes Brother     scirrosis of liver   History  Substance Use Topics  . Smoking status: Former Smoker -- 0.25 packs/day    Types: Cigarettes    Quit date: 09/13/2011  . Smokeless tobacco: Never Used  . Alcohol Use: No   OB History    No data available     Review of Systems  Constitutional: Negative for fever, chills, diaphoresis, appetite change and fatigue.  HENT: Negative for mouth sores, sore throat and trouble swallowing.   Eyes: Negative for visual disturbance.  Respiratory: Negative for cough, chest tightness, shortness of breath and wheezing.   Cardiovascular: Positive for leg swelling. Negative for chest pain.  Gastrointestinal: Negative for nausea, vomiting, abdominal pain, diarrhea and abdominal distention.  Endocrine:  Negative for polydipsia, polyphagia and polyuria.  Genitourinary: Negative for dysuria, frequency and hematuria.  Musculoskeletal: Positive for back pain. Negative for gait problem.  Skin: Negative for color change, pallor and rash.  Neurological: Negative for dizziness, syncope, light-headedness and headaches.  Hematological: Does not bruise/bleed easily.  Psychiatric/Behavioral: Negative for behavioral problems and confusion.    Allergies  Levaquin; Statins; and Tradjenta  Home Medications   Prior to Admission medications   Medication Sig Start Date End Date Taking? Authorizing Provider  amLODipine (NORVASC) 5 MG tablet TAKE 1 TABLET (5 MG TOTAL) BY MOUTH DAILY. 11/12/14   Susy Frizzle, MD  aspirin EC 81 MG tablet Take 81 mg by mouth daily.      Historical Provider, MD   canagliflozin (INVOKANA) 300 MG TABS tablet Take 300 mg by mouth daily. 11/12/14   Susy Frizzle, MD  CINNAMON PO Take 1,000 mg by mouth 2 (two) times daily.      Historical Provider, MD  Coenzyme Q10 (CO Q 10 PO) Take 100 mg by mouth daily.      Historical Provider, MD  cyclobenzaprine (FLEXERIL) 10 MG tablet Take 1 tablet (10 mg total) by mouth 3 (three) times daily as needed for muscle spasms. 05/26/14   Susy Frizzle, MD  fluticasone (FLONASE) 50 MCG/ACT nasal spray PLACE 2 SPRAYS INTO BOTH NOSTRILS DAILY. 11/25/13   Susy Frizzle, MD  HYDROcodone-acetaminophen (NORCO/VICODIN) 5-325 MG per tablet Take 2 tablets by mouth every 4 (four) hours as needed. 03/21/15   Brenda Furry, MD  insulin aspart (NOVOLOG FLEXPEN) 100 UNIT/ML FlexPen 7 units with supper 01/16/15   Susy Frizzle, MD  Insulin Detemir (LEVEMIR FLEXTOUCH) 100 UNIT/ML Pen Inject 30 Units into the skin daily at 10 pm. 12/02/14   Susy Frizzle, MD  INVOKANA 300 MG TABS tablet TAKE 1 TABLET BY MOUTH EVERY DAY 12/01/14   Susy Frizzle, MD  Lactobacillus Casei-Folic Acid (RESTORA RX) 60-1.25 MG CAPS Take by mouth.    Historical Provider, MD  LEVEMIR 100 UNIT/ML injection INJECT 0.3MLS (30 UNITS TOTAL) INTO THE SKIN ATBEDTIME 01/19/15   Susy Frizzle, MD  loperamide (IMODIUM A-D) 2 MG tablet Take 2 mg by mouth 4 (four) times daily as needed for diarrhea or loose stools.    Historical Provider, MD  metFORMIN (GLUCOPHAGE) 1000 MG tablet TAKE 1 TABLETBY MOUTH TWICE A DAY WITH MEALS 11/12/14   Susy Frizzle, MD  metoprolol succinate (TOPROL-XL) 100 MG 24 hr tablet TAKE 1 TABLET BY MOUTH EVERY DAY 11/20/14   Susy Frizzle, MD  naproxen (NAPROSYN) 500 MG tablet Take 1 tablet (500 mg total) by mouth 2 (two) times daily. 03/21/15   Brenda Furry, MD  nitroGLYCERIN (NITROSTAT) 0.4 MG SL tablet Place 1 tablet (0.4 mg total) under the tongue every 5 (five) minutes as needed for chest pain. 11/12/14   Susy Frizzle, MD  pioglitazone (ACTOS)  30 MG tablet Take 1 tablet (30 mg total) by mouth daily. 11/12/14   Susy Frizzle, MD  Pitavastatin Calcium (LIVALO) 4 MG TABS Take 1 tablet (4 mg total) by mouth daily. 11/28/14   Susy Frizzle, MD  predniSONE (DELTASONE) 20 MG tablet Take 1 tablet (20 mg total) by mouth daily with breakfast. 03/21/15   Brenda Furry, MD  promethazine (PHENERGAN) 12.5 MG tablet Take 1 tablet (12.5 mg total) by mouth every 6 (six) hours as needed for nausea or vomiting. 01/23/14   Susy Frizzle, MD  quinapril (ACCUPRIL)  40 MG tablet Take 1 tablet (40 mg total) by mouth 2 (two) times daily. 11/12/14   Susy Frizzle, MD  quinapril (ACCUPRIL) 40 MG tablet TAKE 1 TABLET BY MOUTH TWICE A DAY 01/01/15   Susy Frizzle, MD  traMADol (ULTRAM) 50 MG tablet Take 1 tablet (50 mg total) by mouth every 6 (six) hours as needed. 03/27/14   Susy Frizzle, MD   Triage Vitals: BP 133/53 mmHg  Pulse 50  Temp(Src) 98.2 F (36.8 C) (Oral)  Resp 18  SpO2 98% Physical Exam  Constitutional: She is oriented to person, place, and time. She appears well-developed and well-nourished. No distress.  HENT:  Head: Normocephalic.  Eyes: Conjunctivae are normal. Pupils are equal, round, and reactive to light. No scleral icterus.  Neck: Normal range of motion. Neck supple. No thyromegaly present.  Cardiovascular: Normal rate and regular rhythm.  Exam reveals no gallop and no friction rub.   No murmur heard. Pulmonary/Chest: Effort normal and breath sounds normal. No respiratory distress. She has no wheezes. She has no rales.  Abdominal: Soft. Bowel sounds are normal. She exhibits no distension. There is no tenderness. There is no rebound.  Musculoskeletal: Normal range of motion.  Area of localized phlebitis to left lower leg laterally. Tenderness to left lumbar.   Neurological: She is alert and oriented to person, place, and time.  Reproducible tenderness at the sciatic notch. She has normal neurological function strength and  sensation lower extremity. No findings to suggest radiculopathy, or myelopathy.  Skin: Skin is warm and dry. No rash noted.  Psychiatric: She has a normal mood and affect. Her behavior is normal.    ED Course  Procedures (including critical care time)  COORDINATION OF CARE: 7:34 PM- Discussed treatment plan with patient at bedside and patient agreed to plan.   Labs Review Labs Reviewed - No data to display  Imaging Review No results found.   EKG Interpretation None      MDM   Final diagnoses:  Sciatica, left  Superficial phlebitis  Rotator cuff tendonitis, left   I personally performed the services described in this documentation, which was scribed in my presence. The recorded information has been reviewed and is accurate.    Brenda Furry, MD 03/21/15 [phone]

## 2015-03-21 NOTE — ED Notes (Signed)
Pt from home c/o left leg pain and lower back pain x few days. She reports more pain when standing.

## 2015-03-21 NOTE — Discharge Instructions (Signed)
Apply heat to the area of phlebitis 3-4 times per day. Be aware that the prednisone may increase your sugars 50-100 points for the next several days. If you develop worsening pain or swelling around your area of phlebitis recheck with your physician on Monday.  Sciatica Sciatica is pain, weakness, numbness, or tingling along the path of the sciatic nerve. The nerve starts in the lower back and runs down the back of each leg. The nerve controls the muscles in the lower leg and in the back of the knee, while also providing sensation to the back of the thigh, lower leg, and the sole of your foot. Sciatica is a symptom of another medical condition. For instance, nerve damage or certain conditions, such as a herniated disk or bone spur on the spine, pinch or put pressure on the sciatic nerve. This causes the pain, weakness, or other sensations normally associated with sciatica. Generally, sciatica only affects one side of the body. CAUSES   Herniated or slipped disc.  Degenerative disk disease.  A pain disorder involving the narrow muscle in the buttocks (piriformis syndrome).  Pelvic injury or fracture.  Pregnancy.  Tumor (rare). SYMPTOMS  Symptoms can vary from mild to very severe. The symptoms usually travel from the low back to the buttocks and down the back of the leg. Symptoms can include:  Mild tingling or dull aches in the lower back, leg, or hip.  Numbness in the back of the calf or sole of the foot.  Burning sensations in the lower back, leg, or hip.  Sharp pains in the lower back, leg, or hip.  Leg weakness.  Severe back pain inhibiting movement. These symptoms may get worse with coughing, sneezing, laughing, or prolonged sitting or standing. Also, being overweight may worsen symptoms. DIAGNOSIS  Your caregiver will perform a physical exam to look for common symptoms of sciatica. He or she may ask you to do certain movements or activities that would trigger sciatic nerve  pain. Other tests may be performed to find the cause of the sciatica. These may include:  Blood tests.  X-rays.  Imaging tests, such as an MRI or CT scan. TREATMENT  Treatment is directed at the cause of the sciatic pain. Sometimes, treatment is not necessary and the pain and discomfort goes away on its own. If treatment is needed, your caregiver may suggest:  Over-the-counter medicines to relieve pain.  Prescription medicines, such as anti-inflammatory medicine, muscle relaxants, or narcotics.  Applying heat or ice to the painful area.  Steroid injections to lessen pain, irritation, and inflammation around the nerve.  Reducing activity during periods of pain.  Exercising and stretching to strengthen your abdomen and improve flexibility of your spine. Your caregiver may suggest losing weight if the extra weight makes the back pain worse.  Physical therapy.  Surgery to eliminate what is pressing or pinching the nerve, such as a bone spur or part of a herniated disk. HOME CARE INSTRUCTIONS   Only take over-the-counter or prescription medicines for pain or discomfort as directed by your caregiver.  Apply ice to the affected area for 20 minutes, 3-4 times a day for the first 48-72 hours. Then try heat in the same way.  Exercise, stretch, or perform your usual activities if these do not aggravate your pain.  Attend physical therapy sessions as directed by your caregiver.  Keep all follow-up appointments as directed by your caregiver.  Do not wear high heels or shoes that do not provide proper support.  Check your mattress to see if it is too soft. A firm mattress may lessen your pain and discomfort. SEEK IMMEDIATE MEDICAL CARE IF:   You lose control of your bowel or bladder (incontinence).  You have increasing weakness in the lower back, pelvis, buttocks, or legs.  You have redness or swelling of your back.  You have a burning sensation when you urinate.  You have pain  that gets worse when you lie down or awakens you at night.  Your pain is worse than you have experienced in the past.  Your pain is lasting longer than 4 weeks.  You are suddenly losing weight without reason. MAKE SURE YOU:  Understand these instructions.  Will watch your condition.  Will get help right away if you are not doing well or get worse. Document Released: 08/09/2001 Document Revised: 02/14/2012 Document Reviewed: 12/25/2011 [facility] Patient Information 2015 [place], [place]. This information is not intended to replace advice given to you by your health care provider. Make sure you discuss any questions you have with your health care provider.  Phlebitis Phlebitis is soreness and puffiness (swelling) in a vein.  HOME CARE  Only take medicine as told by your doctor.  Raise (elevate) the affected limb on a pillow as told by your doctor.  Keep a warm pack on the affected vein as told by your doctor. Do not sleep with a heating pad.  Use special stockings or bandages around the area of the affected vein as told by your doctor. These will speed healing and keep the condition from coming back.  Talk to your doctor about all the medicines you take.  Get follow-up blood tests as told by your doctor.  If the phlebitis is in your legs:  Avoid standing or resting for long periods.  Keep your legs moving. Raise your legs when you sit or lie.  Do not smoke.  Follow-up with your doctor as told. GET HELP IF:  You have strange bruises or bleeding.  Your puffiness or pain in the affected area is not getting better.  You are taking medicine to lessen puffiness (anti-inflammatory medicine), and you get belly pain.  You have a fever. GET HELP RIGHT AWAY IF:   The phlebitis gets worse and you have more pain, puffiness (swelling), or redness.  You have trouble breathing or have chest pain. MAKE SURE YOU:   Understand these instructions.  Will watch your  condition.  Will get help right away if you are not doing well or get worse. Document Released: 08/03/2009 Document Revised: 08/20/2013 Document Reviewed: 04/22/2013 [facility] Patient Information 2015 [place], [place]. This information is not intended to replace advice given to you by your health care provider. Make sure you discuss any questions you have with your health care provider.  Rotator Cuff Tendinitis  Rotator cuff tendinitis is inflammation of the tough, cord-like bands that connect muscle to bone (tendons) in your rotator cuff. Your rotator cuff is the collection of all the muscles and tendons that connect your arm to your shoulder. Your rotator cuff holds the head of your upper arm bone (humerus) in the cup (fossa) of your shoulder blade (scapula). CAUSES Rotator cuff tendinitis is usually caused by overusing the joint involved.  SIGNS AND SYMPTOMS  Deep ache in the shoulder also felt on the outside upper arm over the shoulder muscle.  Point tenderness over the area that is injured.  Pain comes on gradually and becomes worse with lifting the arm to the side (abduction) or  turning it inward (internal rotation).  May lead to a chronic tear: When a rotator cuff tendon becomes inflamed, it runs the risk of losing its blood supply, causing some tendon fibers to die. This increases the risk that the tendon can fray and partially or completely tear. DIAGNOSIS Rotator cuff tendinitis is diagnosed by taking a medical history, performing a physical exam, and reviewing results of imaging exams. The medical history is useful to help determine the type of rotator cuff injury. The physical exam will include looking at the injured shoulder, feeling the injured area, and watching you do range-of-motion exercises. X-ray exams are typically done to rule out other causes of shoulder pain, such as fractures. MRI is the imaging exam usually used for significant shoulder injuries. Sometimes a dye study  called CT arthrogram is done, but it is not as widely used as MRI. In some institutions, special ultrasound tests may also be used to aid in the diagnosis. TREATMENT  Less Severe Cases  Use of a sling to rest the shoulder for a short period of time. Prolonged use of the sling can cause stiffness, weakness, and loss of motion of the shoulder joint.  Anti-inflammatory medicines, such as ibuprofen or naproxen sodium, may be prescribed. More Severe Cases  Physical therapy.  Use of steroid injections into the shoulder joint.  Surgery. HOME CARE INSTRUCTIONS   Use a sling or splint until the pain decreases. Prolonged use of the sling can cause stiffness, weakness, and loss of motion of the shoulder joint.  Apply ice to the injured area:  Put ice in a plastic bag.  Place a towel between your skin and the bag.  Leave the ice on for 20 minutes, 2-3 times a day.  Try to avoid use other than gentle range of motion while your shoulder is painful. Use the shoulder and exercise only as directed by your health care provider. Stop exercises or range of motion if pain or discomfort increases, unless directed otherwise by your health care provider.  Only take over-the-counter or prescription medicines for pain, discomfort, or fever as directed by your health care provider.  If you were given a shoulder sling and straps (immobilizer), do not remove it except as directed, or until you see a health care provider for a follow-up exam. If you need to remove it, move your arm as little as possible or as directed.  You may want to sleep on several pillows at night to lessen swelling and pain. SEEK IMMEDIATE MEDICAL CARE IF:   Your shoulder pain increases or new pain develops in your arm, hand, or fingers and is not relieved with medicines.  You have new, unexplained symptoms, especially increased numbness in the hands or loss of strength.  You develop any worsening of the problems that brought you in  for care.  Your arm, hand, or fingers are numb or tingling.  Your arm, hand, or fingers are swollen, painful, or turn white or blue. MAKE SURE YOU:  Understand these instructions.  Will watch your condition.  Will get help right away if you are not doing well or get worse. Document Released: 11/05/2003 Document Revised: 06/05/2013 Document Reviewed: 03/27/2013 [facility] Patient Information 2015 [place], [place]. This information is not intended to replace advice given to you by your health care provider. Make sure you discuss any questions you have with your health care provider.

## 2015-03-30 ENCOUNTER — Other Ambulatory Visit: Payer: Self-pay | Admitting: Family Medicine

## 2015-03-30 NOTE — Telephone Encounter (Signed)
Medication refilled per protocol. 

## 2015-04-13 ENCOUNTER — Other Ambulatory Visit: Payer: Self-pay | Admitting: Family Medicine

## 2015-04-13 DIAGNOSIS — I10 Essential (primary) hypertension: Secondary | ICD-10-CM

## 2015-04-13 MED ORDER — METOPROLOL SUCCINATE ER 100 MG PO TB24: 100.0000 mg | ORAL_TABLET | Freq: Every day | ORAL | Status: DC

## 2015-06-29 ENCOUNTER — Other Ambulatory Visit: Payer: Self-pay | Admitting: Family Medicine

## 2015-06-30 ENCOUNTER — Other Ambulatory Visit: Payer: Self-pay | Admitting: Family Medicine

## 2015-06-30 DIAGNOSIS — E119 Type 2 diabetes mellitus without complications: Principal | ICD-10-CM

## 2015-06-30 DIAGNOSIS — IMO0001 Reserved for inherently not codable concepts without codable children: Secondary | ICD-10-CM

## 2015-06-30 DIAGNOSIS — Z794 Long term (current) use of insulin: Principal | ICD-10-CM

## 2015-06-30 MED ORDER — PIOGLITAZONE HCL 30 MG PO TABS: 30.0000 mg | ORAL_TABLET | Freq: Every day | ORAL | Status: DC

## 2015-07-03 ENCOUNTER — Encounter: Payer: Self-pay | Admitting: Family Medicine

## 2015-07-03 ENCOUNTER — Ambulatory Visit (INDEPENDENT_AMBULATORY_CARE_PROVIDER_SITE_OTHER): Payer: Medicare Other | Admitting: Family Medicine

## 2015-07-03 VITALS — BP 134/66 | HR 72 | Temp 98.3°F | Resp 18 | Wt 187.0 lb

## 2015-07-03 DIAGNOSIS — N182 Chronic kidney disease, stage 2 (mild): Secondary | ICD-10-CM

## 2015-07-03 DIAGNOSIS — Z23 Encounter for immunization: Secondary | ICD-10-CM

## 2015-07-03 DIAGNOSIS — E1165 Type 2 diabetes mellitus with hyperglycemia: Secondary | ICD-10-CM | POA: Diagnosis not present

## 2015-07-03 DIAGNOSIS — E1122 Type 2 diabetes mellitus with diabetic chronic kidney disease: Secondary | ICD-10-CM | POA: Diagnosis not present

## 2015-07-03 DIAGNOSIS — IMO0002 Reserved for concepts with insufficient information to code with codable children: Secondary | ICD-10-CM

## 2015-07-03 DIAGNOSIS — Z794 Long term (current) use of insulin: Secondary | ICD-10-CM

## 2015-07-03 LAB — COMPLETE METABOLIC PANEL WITH GFR
ALT: 13 U/L (ref 6–29)
AST: 16 U/L (ref 10–35)
Albumin: 3.8 g/dL (ref 3.6–5.1)
Alkaline Phosphatase: 74 U/L (ref 33–130)
BUN: 28 mg/dL — ABNORMAL HIGH (ref 7–25)
CHLORIDE: 107 mmol/L (ref 98–110)
CO2: 25 mmol/L (ref 20–31)
Calcium: 9.5 mg/dL (ref 8.6–10.4)
Creat: 1.42 mg/dL — ABNORMAL HIGH (ref 0.50–0.99)
GFR, Est African American: 44 mL/min — ABNORMAL LOW (ref >=60)
GFR, Est Non African American: 38 mL/min — ABNORMAL LOW (ref >=60)
Glucose, Bld: 200 mg/dL — ABNORMAL HIGH (ref 70–99)
Potassium: 3.8 mmol/L (ref 3.5–5.3)
Sodium: 142 mmol/L (ref 135–146)
Total Bilirubin: 0.4 mg/dL (ref 0.2–1.2)
Total Protein: 6.2 g/dL (ref 6.1–8.1)

## 2015-07-03 LAB — HEMOGLOBIN A1C
Hgb A1c MFr Bld: 7.2 % — ABNORMAL HIGH (ref <5.7)
Mean Plasma Glucose: 160 mg/dL — ABNORMAL HIGH (ref <117)

## 2015-07-03 NOTE — Progress Notes (Signed)
Subjective:    Patient ID: Brenda Moreno, female    DOB: September 22, 1945, 69 y.o.   MRN: [id number]  HPI Patient is a 69 y/o African-American female with a past medical history of insulin-dependent diabetes mellitus, coronary artery disease, peripheral vascular disease, and hyperlipidemia. She is currently taking 30 units of Levemir in the evening and 7 units of NovoLog with breakfast. Her fasting blood sugars in the morning and frequently becoming very low and much less than 100. She is also waking up several times during the evening confused and disoriented which concerns me for possible nocturnal hypoglycemia. She also reports swelling in her ankles. She is currently on amlodipine as well as Actos. She denies any chest pain shortness of breath or dyspnea on exertion. She does complain of nocturnal muscle cramps. She does have a history of peripheral vascular disease but she denies any symptoms of claudication she is due today for her flu shot Past Medical History  Diagnosis Date  . Leg pain   . Joint pain   . Carotid artery occlusion   . CAD (coronary artery disease)     S/P cabg  . Peripheral vascular disease ([place])   . Arthritis   . CHF (congestive heart failure) ([place])   . Diabetes mellitus   . Hyperlipidemia   . Hypertension   . Myocardial infarction ([place])   . PVD (peripheral vascular disease) ([place])   . DDD (degenerative disc disease), lumbar    Past Surgical History  Procedure Laterality Date  . Coronary artery bypass graft      1998  . Appendectomy    . Tubal ligation    . Cholecystectomy      Gall bladder  . Pr vein bypass graft,[id number]     Current Outpatient Prescriptions on File Prior to Visit  Medication Sig Dispense Refill  . amLODipine (NORVASC) 5 MG tablet TAKE 1 TABLET (5 MG TOTAL) BY MOUTH DAILY. 90 tablet 3  . aspirin EC 81 MG tablet Take 81 mg by mouth daily.      . canagliflozin (INVOKANA) 300 MG TABS tablet Take 300 mg by mouth daily. 90 tablet 3  .  CINNAMON PO Take 1,000 mg by mouth 2 (two) times daily.      . Coenzyme Q10 (CO Q 10 PO) Take 100 mg by mouth daily.      . cyclobenzaprine (FLEXERIL) 10 MG tablet Take 1 tablet (10 mg total) by mouth 3 (three) times daily as needed for muscle spasms. 30 tablet 0  . fluticasone (FLONASE) 50 MCG/ACT nasal spray PLACE 2 SPRAYS INTO BOTH NOSTRILS DAILY. 0.5 g 2  . HYDROcodone-acetaminophen (NORCO/VICODIN) 5-325 MG per tablet Take 2 tablets by mouth every 4 (four) hours as needed. 10 tablet 0  . Insulin Detemir (LEVEMIR FLEXTOUCH) 100 UNIT/ML Pen Inject 30 Units into the skin daily at 10 pm. 45 mL 3  . INVOKANA 300 MG TABS tablet TAKE 1 TABLET BY MOUTH EVERY DAY 30 tablet 3  . Lactobacillus Casei-Folic Acid (RESTORA RX) 60-1.25 MG CAPS Take by mouth.    . loperamide (IMODIUM A-D) 2 MG tablet Take 2 mg by mouth 4 (four) times daily as needed for diarrhea or loose stools.    . metFORMIN (GLUCOPHAGE) 1000 MG tablet TAKE 1 TABLETBY MOUTH TWICE A DAY WITH MEALS 180 tablet 3  . metoprolol succinate (TOPROL-XL) 100 MG 24 hr tablet Take 1 tablet (100 mg total) by mouth daily. Take with or immediately following a meal. 90 tablet 2  .  naproxen (NAPROSYN) 500 MG tablet Take 1 tablet (500 mg total) by mouth 2 (two) times daily. 30 tablet 0  . nitroGLYCERIN (NITROSTAT) 0.4 MG SL tablet Place 1 tablet (0.4 mg total) under the tongue every 5 (five) minutes as needed for chest pain. 90 tablet 2  . pioglitazone (ACTOS) 30 MG tablet Take 1 tablet (30 mg total) by mouth daily. 90 tablet 1  . Pitavastatin Calcium (LIVALO) 4 MG TABS Take 1 tablet (4 mg total) by mouth daily. 90 tablet 3  . predniSONE (DELTASONE) 20 MG tablet Take 1 tablet (20 mg total) by mouth daily with breakfast. 10 tablet 0  . promethazine (PHENERGAN) 12.5 MG tablet Take 1 tablet (12.5 mg total) by mouth every 6 (six) hours as needed for nausea or vomiting. 30 tablet 0  . quinapril (ACCUPRIL) 40 MG tablet TAKE 1 TABLET BY MOUTH TWICE A DAY 180 tablet 1    . quinapril (ACCUPRIL) 40 MG tablet Take 1 tablet by mouth two  times daily 180 tablet 3  . traMADol (ULTRAM) 50 MG tablet Take 1 tablet (50 mg total) by mouth every 6 (six) hours as needed. 60 tablet 1  . insulin aspart (NOVOLOG FLEXPEN) 100 UNIT/ML FlexPen 7 units with supper (Patient not taking: Reported on 07/03/2015) 15 mL 11  . LEVEMIR 100 UNIT/ML injection INJECT 0.3MLS (30 UNITS TOTAL) INTO THE SKIN ATBEDTIME (Patient not taking: Reported on 07/03/2015) 10 mL 3   No current facility-administered medications on file prior to visit.   Allergies  Allergen Reactions  . Levaquin [Levofloxacin In D5w] Other (See Comments)    Pain all over and in joints  . Statins Other (See Comments)    Severe myalgias to lipitor, crestor, pravastatin and weakness and cramping  in bilateral leg.  [name] [Linagliptin] Itching   Social History   Social History  . Marital Status: Divorced    Spouse Name: N/A  . Number of Children: N/A  . Years of Education: N/A   Occupational History  . Not on file.   Social History Main Topics  . Smoking status: Former Smoker -- 0.25 packs/day    Types: Cigarettes    Quit date: 09/13/2011  . Smokeless tobacco: Never Used  . Alcohol Use: No  . Drug Use: No  . Sexual Activity: Not on file   Other Topics Concern  . Not on file   Social History Narrative      Review of Systems  All other systems reviewed and are negative.      Objective:   Physical Exam  Constitutional: She appears well-developed and well-nourished. No distress.  Cardiovascular: Normal rate, regular rhythm and normal heart sounds.   No murmur heard. Pulmonary/Chest: Effort normal and breath sounds normal. No respiratory distress. She has no wheezes. She has no rales. She exhibits no tenderness.  Abdominal: Soft. Bowel sounds are normal. She exhibits no distension. There is no tenderness. There is no rebound and no guarding.  Musculoskeletal: She exhibits edema.  Skin: She is  not diaphoretic.  Vitals reviewed.         Assessment & Plan:  Uncontrolled type 2 diabetes mellitus with stage 2 chronic kidney disease, with long-term current use of insulin ([place]) - Plan: COMPLETE METABOLIC PANEL WITH GFR, Hemoglobin A1c  I am concerned the patient may be having nocturnal hypoglycemia causing confusion. I would like her to change the Levemir dosage to 30 units in the morning to avoid this. I would like her to start using NovoLog  7 units with dinner and also eat a snack prior to bedtime. I will check a hemoglobin A1c along with a BMP. We could use Lasix 20 mg a day when necessary leg swelling however I believe the leg swelling is not due to congestive heart failure that is rather due to a combination of Actos as well as amlodipine. Patient received her flu shot today in clinic.

## 2015-07-03 NOTE — Addendum Note (Signed)
Addended by: Olena Mater on: 07/03/2015 02:16 PM   Modules accepted: Orders

## 2015-07-07 ENCOUNTER — Telehealth: Payer: Self-pay | Admitting: *Deleted

## 2015-07-07 NOTE — Telephone Encounter (Signed)
Pt came into office today inquiring about medication Lasix and potassium, pt states never received these meds from pharmacy and wanted to know if you still were considering her being on these medications.  Please advise  [facility].

## 2015-07-07 NOTE — Telephone Encounter (Signed)
Lasix 20 mg poqday prn leg swelling, no need for potassium at such a low dose.

## 2015-07-07 NOTE — Telephone Encounter (Signed)
They were not on her med list from her last appt, has she been on them?  I want her to continue her chronic daily meds.

## 2015-07-07 NOTE — Telephone Encounter (Signed)
She has not been on them, stated you had suggested it to her and thought you had sent them to her pharmacy, and it was sent to pharmacy she never received them.

## 2015-07-08 MED ORDER — FUROSEMIDE 20 MG PO TABS: 20.0000 mg | ORAL_TABLET | Freq: Every day | ORAL | Status: DC

## 2015-07-08 NOTE — Telephone Encounter (Signed)
Script called in and pt aware. 

## 2015-07-09 ENCOUNTER — Telehealth: Payer: Self-pay | Admitting: Family Medicine

## 2015-07-09 MED ORDER — FUROSEMIDE 20 MG PO TABS: 20.0000 mg | ORAL_TABLET | Freq: Every day | ORAL | Status: DC

## 2015-07-09 NOTE — Telephone Encounter (Signed)
PHARMACY: [facility]   MEDICATION: LASIK   QTY:    SIG:    PHYSICIAN: PICKARD   PT. PHONE #:

## 2015-07-09 NOTE — Telephone Encounter (Signed)
Medication called/sent to requested pharmacy  

## 2015-08-05 ENCOUNTER — Other Ambulatory Visit: Payer: Self-pay | Admitting: Family Medicine

## 2015-10-01 ENCOUNTER — Telehealth: Payer: Self-pay | Admitting: Family Medicine

## 2015-10-01 NOTE — Telephone Encounter (Signed)
Patient is calling to discuss getting a letter regarding the medication livalo? Not sure of spelling, please call to discuss with her at [phone]

## 2015-10-01 NOTE — Telephone Encounter (Signed)
LMTRC

## 2015-10-02 ENCOUNTER — Encounter: Payer: Self-pay | Admitting: Family

## 2015-10-08 ENCOUNTER — Ambulatory Visit (INDEPENDENT_AMBULATORY_CARE_PROVIDER_SITE_OTHER)
Admission: RE | Admit: 2015-10-08 | Discharge: 2015-10-08 | Disposition: A | Discharge status: Home / Self Care | Payer: Medicare Other | Source: Ambulatory Visit | Attending: Family | Admitting: Family

## 2015-10-08 ENCOUNTER — Ambulatory Visit (HOSPITAL_COMMUNITY)
Admission: RE | Admit: 2015-10-08 | Discharge: 2015-10-08 | Disposition: A | Discharge status: Home / Self Care | Payer: Medicare Other | Source: Ambulatory Visit | Attending: Family | Admitting: Family

## 2015-10-08 ENCOUNTER — Ambulatory Visit (INDEPENDENT_AMBULATORY_CARE_PROVIDER_SITE_OTHER): Payer: Medicare Other | Admitting: Family

## 2015-10-08 ENCOUNTER — Encounter: Payer: Self-pay | Admitting: Family

## 2015-10-08 VITALS — BP 159/84 | HR 58 | Temp 97.8°F | Resp 16 | Ht 66.0 in | Wt 187.0 lb

## 2015-10-08 DIAGNOSIS — I779 Disorder of arteries and arterioles, unspecified: Secondary | ICD-10-CM | POA: Diagnosis not present

## 2015-10-08 DIAGNOSIS — E1151 Type 2 diabetes mellitus with diabetic peripheral angiopathy without gangrene: Secondary | ICD-10-CM | POA: Insufficient documentation

## 2015-10-08 DIAGNOSIS — Z4889 Encounter for other specified surgical aftercare: Secondary | ICD-10-CM

## 2015-10-08 DIAGNOSIS — Z95828 Presence of other vascular implants and grafts: Secondary | ICD-10-CM

## 2015-10-08 DIAGNOSIS — F17200 Nicotine dependence, unspecified, uncomplicated: Secondary | ICD-10-CM

## 2015-10-08 DIAGNOSIS — I739 Peripheral vascular disease, unspecified: Secondary | ICD-10-CM | POA: Diagnosis not present

## 2015-10-08 DIAGNOSIS — I10 Essential (primary) hypertension: Secondary | ICD-10-CM | POA: Diagnosis not present

## 2015-10-08 DIAGNOSIS — E785 Hyperlipidemia, unspecified: Secondary | ICD-10-CM | POA: Diagnosis not present

## 2015-10-08 DIAGNOSIS — Z720 Tobacco use: Secondary | ICD-10-CM | POA: Diagnosis not present

## 2015-10-08 DIAGNOSIS — Z48812 Encounter for surgical aftercare following surgery on the circulatory system: Secondary | ICD-10-CM

## 2015-10-08 NOTE — Addendum Note (Signed)
Addended by: Dorthula Rue L on: 10/08/2015 04:59 PM   Modules accepted: Orders

## 2015-10-08 NOTE — Patient Instructions (Signed)
Peripheral Vascular Disease Peripheral vascular disease (PVD) is a disease of the blood vessels that are not part of your heart and brain. A simple term for PVD is poor circulation. In most cases, PVD narrows the blood vessels that carry blood from your heart to the rest of your body. This can result in a decreased supply of blood to your arms, legs, and internal organs, like your stomach or kidneys. However, it most often affects a person's lower legs and feet. There are two types of PVD.  Organic PVD. This is the more common type. It is caused by damage to the structure of blood vessels.  Functional PVD. This is caused by conditions that make blood vessels contract and tighten (spasm). Without treatment, PVD tends to get worse over time. PVD can also lead to acute ischemic limb. This is when an arm or limb suddenly has trouble getting enough blood. This is a medical emergency. CAUSES Each type of PVD has many different causes. The most common cause of PVD is buildup of a fatty material (plaque) inside of your arteries (atherosclerosis). Small amounts of plaque can break off from the walls of the blood vessels and become lodged in a smaller artery. This blocks blood flow and can cause acute ischemic limb. Other common causes of PVD include:  Blood clots that form inside of blood vessels.  Injuries to blood vessels.  Diseases that cause inflammation of blood vessels or cause blood vessel spasms.  Health behaviors and health history that increase your risk of developing PVD. RISK FACTORS  You may have a greater risk of PVD if you:  Have a family history of PVD.  Have certain medical conditions, including:  High cholesterol.  Diabetes.  High blood pressure (hypertension).  Coronary heart disease.  Past problems with blood clots.  Past injury, such as burns or a broken bone. These may have damaged blood vessels in your limbs.  Buerger disease. This is caused by inflamed blood  vessels in your hands and feet.  Some forms of arthritis.  Rare birth defects that affect the arteries in your legs.  Use tobacco.  Do not get enough exercise.  Are obese.  Are age 50 or older. SIGNS AND SYMPTOMS  PVD may cause many different symptoms. Your symptoms depend on what part of your body is not getting enough blood. Some common signs and symptoms include:  Cramps in your lower legs. This may be a symptom of poor leg circulation (claudication).  Pain and weakness in your legs while you are physically active that goes away when you rest (intermittent claudication).  Leg pain when at rest.  Leg numbness, tingling, or weakness.  Coldness in a leg or foot, especially when compared with the other leg.  Skin or hair changes. These can include:  Hair loss.  Shiny skin.  Pale or bluish skin.  Thick toenails.  Inability to get or maintain an erection (erectile dysfunction). People with PVD are more prone to developing ulcers and sores on their toes, feet, or legs. These may take longer than normal to heal. DIAGNOSIS Your health care provider may diagnose PVD from your signs and symptoms. The health care provider will also do a physical exam. You may have tests to find out what is causing your PVD and determine its severity. Tests may include:  Blood pressure recordings from your arms and legs and measurements of the strength of your pulses (pulse volume recordings).  Imaging studies using sound waves to take pictures of   the blood flow through your blood vessels (Doppler ultrasound).  Injecting a dye into your blood vessels before having imaging studies using:  X-rays (angiogram or arteriogram).  Computer-generated X-rays (CT angiogram).  A powerful electromagnetic field and a computer (magnetic resonance angiogram or MRA). TREATMENT Treatment for PVD depends on the cause of your condition and the severity of your symptoms. It also depends on your age. Underlying  causes need to be treated and controlled. These include long-lasting (chronic) conditions, such as diabetes, high cholesterol, and high blood pressure. You may need to first try making lifestyle changes and taking medicines. Surgery may be needed if these do not work. Lifestyle changes may include:  Quitting smoking.  Exercising regularly.  Following a low-fat, low-cholesterol diet. Medicines may include:  Blood thinners to prevent blood clots.  Medicines to improve blood flow.  Medicines to improve your blood cholesterol levels. Surgical procedures may include:  A procedure that uses an inflated balloon to open a blocked artery and improve blood flow (angioplasty).  A procedure to put in a tube (stent) to keep a blocked artery open (stent implant).  Surgery to reroute blood flow around a blocked artery (peripheral bypass surgery).  Surgery to remove dead tissue from an infected wound on the affected limb.  Amputation. This is surgical removal of the affected limb. This may be necessary in cases of acute ischemic limb that are not improved through medical or surgical treatments. HOME CARE INSTRUCTIONS  Take medicines only as directed by your health care provider.  Do not use any tobacco products, including cigarettes, chewing tobacco, or electronic cigarettes. If you need help quitting, ask your health care provider.  Lose weight if you are overweight, and maintain a healthy weight as directed by your health care provider.  Eat a diet that is low in fat and cholesterol. If you need help, ask your health care provider.  Exercise regularly. Ask your health care provider to suggest some good activities for you.  Use compression stockings or other mechanical devices as directed by your health care provider.  Take good care of your feet.  Wear comfortable shoes that fit well.  Check your feet often for any cuts or sores. SEEK MEDICAL CARE IF:  You have cramps in your legs  while walking.  You have leg pain when you are at rest.  You have coldness in a leg or foot.  Your skin changes.  You have erectile dysfunction.  You have cuts or sores on your feet that are not healing. SEEK IMMEDIATE MEDICAL CARE IF:  Your arm or leg turns cold and blue.  Your arms or legs become red, warm, swollen, painful, or numb.  You have chest pain or trouble breathing.  You suddenly have weakness in your face, arm, or leg.  You become very confused or lose the ability to speak.  You suddenly have a very bad headache or lose your vision.   This information is not intended to replace advice given to you by your health care provider. Make sure you discuss any questions you have with your health care provider.   Document Released: 09/22/2004 Document Revised: 09/05/2014 Document Reviewed: 01/23/2014 Elsevier Interactive Patient Education 2016 Elsevier Inc[employer]    Smoking Cessation, Tips for Success If you are ready to quit smoking, congratulations! You have chosen to help yourself be healthier. Cigarettes bring nicotine, tar, carbon monoxide, and other irritants into your body. Your lungs, heart, and blood vessels will be able to work better without these   poisons. There are many different ways to quit smoking. Nicotine gum, nicotine patches, a nicotine inhaler, or nicotine nasal spray can help with physical craving. Hypnosis, support groups, and medicines help break the habit of smoking. WHAT THINGS CAN I DO TO MAKE QUITTING EASIER?  Here are some tips to help you quit for good:  Pick a date when you will quit smoking completely. Tell all of your friends and family about your plan to quit on that date.  Do not try to slowly cut down on the number of cigarettes you are smoking. Pick a quit date and quit smoking completely starting on that day.  Throw away all cigarettes.   Clean and remove all ashtrays from your home, work, and car.  On a card, write down your reasons  for quitting. Carry the card with you and read it when you get the urge to smoke.  Cleanse your body of nicotine. Drink enough water and fluids to keep your urine clear or pale yellow. Do this after quitting to flush the nicotine from your body.  Learn to predict your moods. Do not let a bad situation be your excuse to have a cigarette. Some situations in your life might tempt you into wanting a cigarette.  Never have "just one" cigarette. It leads to wanting another and another. Remind yourself of your decision to quit.  Change habits associated with smoking. If you smoked while driving or when feeling stressed, try other activities to replace smoking. Stand up when drinking your coffee. Brush your teeth after eating. Sit in a different chair when you read the paper. Avoid alcohol while trying to quit, and try to drink fewer caffeinated beverages. Alcohol and caffeine may urge you to smoke.  Avoid foods and drinks that can trigger a desire to smoke, such as sugary or spicy foods and alcohol.  Ask people who smoke not to smoke around you.  Have something planned to do right after eating or having a cup of coffee. For example, plan to take a walk or exercise.  Try a relaxation exercise to calm you down and decrease your stress. Remember, you may be tense and nervous for the first 2 weeks after you quit, but this will pass.  Find new activities to keep your hands busy. Play with a pen, coin, or rubber band. Doodle or draw things on paper.  Brush your teeth right after eating. This will help cut down on the craving for the taste of tobacco after meals. You can also try mouthwash.   Use oral substitutes in place of cigarettes. Try using lemon drops, carrots, cinnamon sticks, or chewing gum. Keep them handy so they are available when you have the urge to smoke.  When you have the urge to smoke, try deep breathing.  Designate your home as a nonsmoking area.  If you are a heavy smoker, ask your  health care provider about a prescription for nicotine chewing gum. It can ease your withdrawal from nicotine.  Reward yourself. Set aside the cigarette money you save and buy yourself something nice.  Look for support from others. Join a support group or smoking cessation program. Ask someone at home or at work to help you with your plan to quit smoking.  Always ask yourself, "Do I need this cigarette or is this just a reflex?" Tell yourself, "Today, I choose not to smoke," or "I do not want to smoke." You are reminding yourself of your decision to quit.  Do not replace   cigarette smoking with electronic cigarettes (commonly called e-cigarettes). The safety of e-cigarettes is unknown, and some may contain harmful chemicals.  If you relapse, do not give up! Plan ahead and think about what you will do the next time you get the urge to smoke. HOW WILL I FEEL WHEN I QUIT SMOKING? You may have symptoms of withdrawal because your body is used to nicotine (the addictive substance in cigarettes). You may crave cigarettes, be irritable, feel very hungry, cough often, get headaches, or have difficulty concentrating. The withdrawal symptoms are only temporary. They are strongest when you first quit but will go away within 10-14 days. When withdrawal symptoms occur, stay in control. Think about your reasons for quitting. Remind yourself that these are signs that your body is healing and getting used to being without cigarettes. Remember that withdrawal symptoms are easier to treat than the major diseases that smoking can cause.  Even after the withdrawal is over, expect periodic urges to smoke. However, these cravings are generally short lived and will go away whether you smoke or not. Do not smoke! WHAT RESOURCES ARE AVAILABLE TO HELP ME QUIT SMOKING? Your health care provider can direct you to community resources or hospitals for support, which may include:  Group  support.  Education.  Hypnosis.  Therapy.   This information is not intended to replace advice given to you by your health care provider. Make sure you discuss any questions you have with your health care provider.   Document Released: 05/13/2004 Document Revised: 09/05/2014 Document Reviewed: 01/31/2013 Elsevier Interactive Patient Education 2016 Elsevier Inc.  

## 2015-10-08 NOTE — Progress Notes (Signed)
[facility] [place]   CC: Follow up PAD  History of Present Illness Brenda Moreno is a 70 y.o. female patient of Dr. Oneida Alar who is status post right CIA stent in 2007. She returns today for LE arterial perfusion evaluation. She had an MI in 1998, then 6 vessel CABG. Has moderate claudication in both calves, left worse than right, after walking about 1/2 mile, relieved by short rest. Pt denies non-healing wounds.  Pt denies any hx of stroke or TIA.  Pt reports New Medical or Surgical History: has what sounds like lumbar HNP, had ESI's which helped to a large degree, recently diagnosed with IBS, had vomiting, diarrhea; this is now under better control with probiotics and eating beans, lost about 20 pounds with this, but has gained most of it back since she is no longer having diarrhea. She states she has been walking more.  [facility]   She states her blood pressure has been normal, forgot to take her blood pressure medication this morning; I advised her to take her blood pressure medication ASAP.   Pt Diabetic: Yes, states in good control, states uncontrolled for a while, getting under better control, states last A1C was 7.? Pt smoker: 1/2 ppd  Pt meds include: Statin :Yes, several statins caused legs weakness, is tolerating the current statin, Livalo Betablocker: Yes ASA: Yes Other anticoagulants/antiplatelets: no   Past Medical History  Diagnosis Date  . Leg pain   . Joint pain   . Carotid artery occlusion   . CAD (coronary artery disease)     S/P cabg  . Peripheral vascular disease ([place])   . Arthritis   . CHF (congestive heart failure) ([place])   . Diabetes mellitus   . Hyperlipidemia   . Hypertension   . Myocardial infarction ([place])   . PVD (peripheral vascular disease) ([place])   . DDD (degenerative disc disease), lumbar     Social History Social History  Substance Use Topics  . Smoking status: Former Smoker -- 0.25  packs/day    Types: Cigarettes    Quit date: 09/13/2011  . Smokeless tobacco: Never Used  . Alcohol Use: No    Family History Family History  Problem Relation Age of Onset  . Heart disease Mother   . Diabetes Mother   . Hyperlipidemia Mother   . Hypertension Mother   . Heart disease Father     Heart Disease before age 32  . Diabetes Father   . Hyperlipidemia Father   . Hypertension Father   . Heart disease Sister     heart attack  . Diabetes Sister     Amputation  . Hypertension Sister   . Other Sister     history of amputation  . Heart attack Sister   . Coronary artery disease Other   . Diabetes Brother   . Hypertension Brother   . Heart disease Brother 61    Before age 63  . Hyperlipidemia Brother   . Diabetes Brother     scirrosis of liver    Past Surgical History  Procedure Laterality Date  . Coronary artery bypass graft      1998  . Appendectomy    . Tubal ligation    . Cholecystectomy      Gall bladder  . Pr vein bypass graft,aorto-fem-pop      Allergies  Allergen Reactions  . Levaquin [Levofloxacin In D5w] Other (See Comments)    Pain all over and  in joints  . Statins Other (See Comments)    Severe myalgias to lipitor, crestor, pravastatin and weakness and cramping  in bilateral leg.  [name] [Linagliptin] Itching    Current Outpatient Prescriptions  Medication Sig Dispense Refill  . amLODipine (NORVASC) 5 MG tablet TAKE 1 TABLET (5 MG TOTAL) BY MOUTH DAILY. 90 tablet 3  . aspirin EC 81 MG tablet Take 81 mg by mouth daily.      . canagliflozin (INVOKANA) 300 MG TABS tablet Take 300 mg by mouth daily. 90 tablet 3  . CINNAMON PO Take 1,000 mg by mouth 2 (two) times daily.      . Coenzyme Q10 (CO Q 10 PO) Take 100 mg by mouth daily.      . cyclobenzaprine (FLEXERIL) 10 MG tablet Take 1 tablet (10 mg total) by mouth 3 (three) times daily as needed for muscle spasms. 30 tablet 0  . fluticasone (FLONASE) 50 MCG/ACT nasal spray PLACE 2 SPRAYS INTO  BOTH NOSTRILS DAILY. 0.5 g 2  . furosemide (LASIX) 20 MG tablet Take 1 tablet by mouth  daily 60 tablet 11  . HYDROcodone-acetaminophen (NORCO/VICODIN) 5-325 MG per tablet Take 2 tablets by mouth every 4 (four) hours as needed. (Patient taking differently: Take 2 tablets by mouth as needed. ) 10 tablet 0  . insulin aspart (NOVOLOG FLEXPEN) 100 UNIT/ML FlexPen 7 units with supper (Patient not taking: Reported on 07/03/2015) 15 mL 11  . Insulin Detemir (LEVEMIR FLEXTOUCH) 100 UNIT/ML Pen Inject 30 Units into the skin daily at 10 pm. (Patient not taking: Reported on 10/08/2015) 45 mL 3  . INVOKANA 300 MG TABS tablet TAKE 1 TABLET BY MOUTH EVERY DAY 30 tablet 3  . Lactobacillus Casei-Folic Acid (RESTORA RX) 60-1.25 MG CAPS Take by mouth.    Marland Kitchen LEVEMIR 100 UNIT/ML injection INJECT 0.3MLS (30 UNITS TOTAL) INTO THE SKIN ATBEDTIME 10 mL 3  . loperamide (IMODIUM A-D) 2 MG tablet Take 2 mg by mouth 4 (four) times daily as needed for diarrhea or loose stools.    . metFORMIN (GLUCOPHAGE) 1000 MG tablet TAKE 1 TABLETBY MOUTH TWICE A DAY WITH MEALS 180 tablet 3  . metoprolol succinate (TOPROL-XL) 100 MG 24 hr tablet Take 1 tablet (100 mg total) by mouth daily. Take with or immediately following a meal. 90 tablet 2  . naproxen (NAPROSYN) 500 MG tablet Take 1 tablet (500 mg total) by mouth 2 (two) times daily. 30 tablet 0  . nitroGLYCERIN (NITROSTAT) 0.4 MG SL tablet Place 1 tablet (0.4 mg total) under the tongue every 5 (five) minutes as needed for chest pain. 90 tablet 2  . pioglitazone (ACTOS) 30 MG tablet Take 1 tablet (30 mg total) by mouth daily. 90 tablet 1  . Pitavastatin Calcium (LIVALO) 4 MG TABS Take 1 tablet (4 mg total) by mouth daily. 90 tablet 3  . predniSONE (DELTASONE) 20 MG tablet Take 1 tablet (20 mg total) by mouth daily with breakfast. (Patient not taking: Reported on 10/08/2015) 10 tablet 0  . promethazine (PHENERGAN) 12.5 MG tablet Take 1 tablet (12.5 mg total) by mouth every 6 (six) hours as needed  for nausea or vomiting. 30 tablet 0  . quinapril (ACCUPRIL) 40 MG tablet TAKE 1 TABLET BY MOUTH TWICE A DAY 180 tablet 1  . quinapril (ACCUPRIL) 40 MG tablet Take 1 tablet by mouth two  times daily (Patient not taking: Reported on 10/08/2015) 180 tablet 3  . traMADol (ULTRAM) 50 MG tablet Take 1 tablet (50 mg  total) by mouth every 6 (six) hours as needed. 60 tablet 1   No current facility-administered medications for this visit.    ROS: See HPI for pertinent positives and negatives.   Physical Examination  Filed Vitals:   10/08/15 0956  BP: 172/81  Pulse: 58  Temp: 97.8 F (36.6 C)  TempSrc: Oral  Resp: 16  Height: 5\' 6"  (1.676 m)  Weight: 187 lb (84.823 kg)  SpO2: 99%   Body mass index is 30.2 kg/(m^2).  General: A&O x 3, WDWN. Gait: normal Eyes: Pupils equal Pulmonary: CTAB, without wheezes , rales or rhonchi Cardiac: regular Rythm , without detected murmur     Carotid Bruits Left Right   Negative Negative  Aorta: is not palpable Radial pulses: are palpable   VASCULAR EXAM: Extremities without ischemic changes  without Gangrene; without open wounds.     LE Pulses LEFT RIGHT   FEMORAL 1+ palpable 2+ palpable    POPLITEAL not palpable  not palpable   POSTERIOR TIBIAL not palpable  not palpable    DORSALIS PEDIS  ANTERIOR TIBIAL 2+palpable  2+palpable    Abdomen: soft, NT, no palpable masses. Skin: no rashes, no ulcers. Musculoskeletal: no muscle wasting or atrophy. Neurologic: A&O X 3; Appropriate Affect ; SENSATION: normal; MOTOR FUNCTION: moving all extremities equally, motor strength 5/5 throughout. Speech is fluent/normal. CN 2-12 intact.          Non-Invasive Vascular Imaging: DATE:  10/08/2015  Iliac Artery Stent Duplex: Decreased visualization of the abdominal vasculature and right CIA stent due to overlying bowel gas and patient body habitus. Patent right CIA stent with greater than 50% stenosis of the distal abdominal aorta and right proximal common iliac artery, based on limited visualization. No significant change in the right CIA compared to the previous exam on 10/02/14.  ABI (Date: 10/08/2015)  R: 0.94 (0.92, 10/02/14), DP: biphasic, PT: biphasic, TBI: 0.89  L: 0.89 (0.94), DP: biphasic, PT: biphasic, TBI: 0.75   ASSESSMENT: Brenda Moreno is a 70 y.o. female who is status post a right CIA stent in 2007 with mild claudication symptoms in calves.  Today's iliac artery stent Duplex a patent right CIA stent with greater than 50% stenosis of the distal abdominal aorta and right proximal common iliac artery, based on limited visualization. No significant change in the right CIA compared to the previous exam on 10/02/14. ABI slightly decreased on the left, stable in the right, all biphasic waveforms; a year ago all waveforms were triphasic. Both ABI's indicate mild arterial occlusive disease in both legs.   Her atherosclerotic risk factors include recently in control DM and active smoking, see Plan.   PLAN:  Graduated walking program discussed in detail.  The patient was counseled re smoking cessation and given several free resources re smoking cessation.  Based on the patient's vascular studies and examination, pt will return to clinic in 1 year for ABI's and aortoiliac Duplex for right iliac stent evaluation  I discussed in depth with the patient the nature of atherosclerosis, and emphasized the importance of maximal medical management including strict control of blood pressure, blood glucose, and lipid levels, obtaining regular exercise, and cessation of smoking.  The patient is aware that without maximal medical management the underlying atherosclerotic disease  process will progress, limiting the benefit of any interventions.  The patient was given information about PAD including signs, symptoms, treatment, what symptoms should prompt the patient to seek immediate medical care, and risk reduction measures to take.  Brenda Level  Sequoyah Counterman, RN, MSN, FNP-C [facility] Phone: [phone]  Clinic MD: [facility]  10/08/2015 10:06 AM

## 2015-10-08 NOTE — Progress Notes (Signed)
Filed Vitals:   10/08/15 0956 10/08/15 1009  BP: 172/81 159/84  Pulse: 58 58  Temp: 97.8 F (36.6 C)   TempSrc: Oral   Resp: 16   Height: 5\' 6"  (1.676 m)   Weight: 187 lb (84.823 kg)   SpO2: 99%

## 2015-10-15 ENCOUNTER — Encounter: Payer: Self-pay | Admitting: *Deleted

## 2015-10-15 NOTE — Telephone Encounter (Signed)
Received PA determination.   PA denied.   Appeal faxed to [facility].

## 2015-10-27 ENCOUNTER — Other Ambulatory Visit: Payer: Self-pay | Admitting: Family Medicine

## 2015-10-27 DIAGNOSIS — Z1239 Encounter for other screening for malignant neoplasm of breast: Secondary | ICD-10-CM

## 2015-11-03 ENCOUNTER — Ambulatory Visit (INDEPENDENT_AMBULATORY_CARE_PROVIDER_SITE_OTHER): Payer: Medicare Other | Admitting: Family Medicine

## 2015-11-03 ENCOUNTER — Encounter: Payer: Self-pay | Admitting: Family Medicine

## 2015-11-03 VITALS — BP 130/68 | HR 64 | Temp 98.1°F | Resp 12 | Ht 66.0 in | Wt 181.0 lb

## 2015-11-03 DIAGNOSIS — E119 Type 2 diabetes mellitus without complications: Secondary | ICD-10-CM

## 2015-11-03 DIAGNOSIS — B372 Candidiasis of skin and nail: Secondary | ICD-10-CM | POA: Diagnosis not present

## 2015-11-03 DIAGNOSIS — E7800 Pure hypercholesterolemia, unspecified: Secondary | ICD-10-CM

## 2015-11-03 DIAGNOSIS — J069 Acute upper respiratory infection, unspecified: Secondary | ICD-10-CM | POA: Diagnosis not present

## 2015-11-03 DIAGNOSIS — Z794 Long term (current) use of insulin: Secondary | ICD-10-CM

## 2015-11-03 MED ORDER — AMOXICILLIN-POT CLAVULANATE 875-125 MG PO TABS: 1.0000 | ORAL_TABLET | Freq: Two times a day (BID) | ORAL | Status: DC

## 2015-11-03 MED ORDER — CLOTRIMAZOLE-BETAMETHASONE 1-0.05 % EX CREA: 1.0000 "application " | TOPICAL_CREAM | Freq: Two times a day (BID) | CUTANEOUS | Status: DC

## 2015-11-03 NOTE — Patient Instructions (Signed)
Return for fasting labs  Take antibiotics as prescribed  Use nasal saline Stop the alka seltzer Continue mucinex  Use the cream twice a day  F/U Dr. Dennard Schaumann in 3 months

## 2015-11-03 NOTE — Progress Notes (Signed)
Patient ID: Brenda Moreno, female   DOB: Oct 17, 1945, 70 y.o.   MRN: [id number]   Subjective:    Patient ID: Brenda Moreno, female    DOB: September 02, 1945, 70 y.o.   MRN: [id number]  Patient presents for Illness  Patient with worsening illness. She started with upper respiratory symptoms cough congestion in her chest. She took some over-the-counter medications ( Mucinex, Alka seltzer) down and she is having more sinus pressure and pain she felt pain on the left side of her face. She has a lot of drainage as well. She's not had any recent flu fever. She has been taking some Mucinex. She had a few episodes of diarrhea today she's not had any vomiting.  She also states that she has had some itching beneath the breasts she has been scratching she has a lot of moles in the region.  He is unable to tolerate the cholesterol medication she was put on Livalo recently however her insurance does not cover this she was interested in the new injectable cholesterol medication.  Review Of Systems:  GEN- denies fatigue, fever, weight loss,weakness, recent illness HEENT- denies eye drainage, change in vision, +nasal discharge, CVS- denies chest pain, palpitations RESP- denies SOB, cough, wheeze ABD- denies N/V, change in stools, abd pain GU- denies dysuria, hematuria, dribbling, incontinence MSK- denies joint pain, muscle aches, injury Neuro- denies headache, dizziness, syncope, seizure activity       Objective:    BP 130/68 mmHg  Pulse 64  Temp(Src) 98.1 F (36.7 C) (Oral)  Resp 12  Ht 5\' 6"  (1.676 m)  Wt 181 lb (82.101 kg)  BMI 29.23 kg/m2 GEN- NAD, alert and oriented x3 HEENT- PERRL, EOMI, non injected sclera, pink conjunctiva, MMM, oropharynx mild injection, TM clear bilat no effusion,  + maxillary sinus tenderness, inflammed turbinates,  Nasal drainage  Neck- Supple, no LAD CVS- RRR, no murmur RESP-CTAB Skin- erthema with mild maceration beneath breast, muliple skin tags, hyperpigmented and  flesh toned  Pulses- Radial 2+         Assessment & Plan:      Problem List Items Addressed This Visit    HYPERCHOLESTEROLEMIA - Primary    Recheck labs,s he may be candidate for the injectables      Diabetes mellitus, type II, insulin dependent ([place])    Repeat A1C, continue insulin therapy, has been fairly well controlled        Other Visit Diagnoses    Candidiasis, intertrigo        Lotrisone given, she has moles as well, benign appearing skin tags     Relevant Medications    clotrimazole-betamethasone (LOTRISONE) cream    Acute URI        uri WITH NOW sinusitis, given augmentin, flonase, mucinex    Relevant Medications    clotrimazole-betamethasone (LOTRISONE) cream       Note: This dictation was prepared with Dragon dictation along with smaller phrase technology. Any transcriptional errors that result from this process are unintentional.

## 2015-11-04 ENCOUNTER — Encounter: Payer: Self-pay | Admitting: Family Medicine

## 2015-11-04 NOTE — Assessment & Plan Note (Signed)
Recheck labs,s he may be candidate for the injectables

## 2015-11-04 NOTE — Assessment & Plan Note (Signed)
Repeat A1C, continue insulin therapy, has been fairly well controlled

## 2015-11-06 ENCOUNTER — Ambulatory Visit
Admission: RE | Admit: 2015-11-06 | Discharge: 2015-11-06 | Disposition: A | Discharge status: Home / Self Care | Payer: Medicare Other | Source: Ambulatory Visit | Attending: Family Medicine | Admitting: Family Medicine

## 2015-11-06 DIAGNOSIS — Z1239 Encounter for other screening for malignant neoplasm of breast: Secondary | ICD-10-CM

## 2015-11-06 DIAGNOSIS — Z1231 Encounter for screening mammogram for malignant neoplasm of breast: Secondary | ICD-10-CM | POA: Diagnosis not present

## 2015-11-10 ENCOUNTER — Telehealth: Payer: Self-pay | Admitting: Family Medicine

## 2015-11-10 ENCOUNTER — Other Ambulatory Visit: Payer: Medicare Other

## 2015-11-10 DIAGNOSIS — E7800 Pure hypercholesterolemia, unspecified: Secondary | ICD-10-CM

## 2015-11-10 DIAGNOSIS — Z794 Long term (current) use of insulin: Secondary | ICD-10-CM | POA: Diagnosis not present

## 2015-11-10 DIAGNOSIS — E119 Type 2 diabetes mellitus without complications: Secondary | ICD-10-CM | POA: Diagnosis not present

## 2015-11-10 LAB — CBC WITH DIFFERENTIAL/PLATELET
BASOS ABS: 0 10*3/uL (ref 0.0–0.1)
Basophils Relative: 0 % (ref 0–1)
EOS ABS: 0.1 10*3/uL (ref 0.0–0.7)
EOS PCT: 2 % (ref 0–5)
HCT: 45.5 % (ref 36.0–46.0)
Hemoglobin: 15.2 g/dL — ABNORMAL HIGH (ref 12.0–15.0)
Lymphocytes Relative: 41 % (ref 12–46)
Lymphs Abs: 2.5 10*3/uL (ref 0.7–4.0)
MCH: 27.9 pg (ref 26.0–34.0)
MCHC: 33.4 g/dL (ref 30.0–36.0)
MCV: 83.6 fL (ref 78.0–100.0)
MONOS PCT: 8 % (ref 3–12)
MPV: 10.8 fL (ref 8.6–12.4)
Monocytes Absolute: 0.5 10*3/uL (ref 0.1–1.0)
Neutro Abs: 2.9 10*3/uL (ref 1.7–7.7)
Neutrophils Relative %: 49 % (ref 43–77)
PLATELETS: 215 10*3/uL (ref 150–400)
RBC: 5.44 MIL/uL — ABNORMAL HIGH (ref 3.87–5.11)
RDW: 17.2 % — AB (ref 11.5–15.5)
WBC: 6 10*3/uL (ref 4.0–10.5)

## 2015-11-10 LAB — LIPID PANEL
Cholesterol: 277 mg/dL — ABNORMAL HIGH (ref 125–200)
HDL: 45 mg/dL — ABNORMAL LOW (ref >=46)
LDL Cholesterol: 206 mg/dL — ABNORMAL HIGH (ref <130)
Total CHOL/HDL Ratio: 6.2 Ratio — ABNORMAL HIGH (ref <=5.0)
Triglycerides: 131 mg/dL (ref <150)
VLDL: 26 mg/dL (ref <30)

## 2015-11-10 LAB — COMPREHENSIVE METABOLIC PANEL
ALT: 9 U/L (ref 6–29)
AST: 10 U/L (ref 10–35)
Albumin: 3.7 g/dL (ref 3.6–5.1)
Alkaline Phosphatase: 78 U/L (ref 33–130)
BUN: 22 mg/dL (ref 7–25)
CHLORIDE: 109 mmol/L (ref 98–110)
CO2: 26 mmol/L (ref 20–31)
Calcium: 9.6 mg/dL (ref 8.6–10.4)
Creat: 1.2 mg/dL — ABNORMAL HIGH (ref 0.50–0.99)
Glucose, Bld: 76 mg/dL (ref 70–99)
Potassium: 3.9 mmol/L (ref 3.5–5.3)
Sodium: 144 mmol/L (ref 135–146)
Total Bilirubin: 0.3 mg/dL (ref 0.2–1.2)
Total Protein: 6.5 g/dL (ref 6.1–8.1)

## 2015-11-10 MED ORDER — PITAVASTATIN CALCIUM 4 MG PO TABS: 1.0000 | ORAL_TABLET | Freq: Every day | ORAL | Status: DC

## 2015-11-10 NOTE — Telephone Encounter (Signed)
Received letter from [facility] for the appeal that was sent for Livalo and it was approved valid from 10/12/2015 - 08/28/2016 - Pharmacy aware.

## 2015-11-11 LAB — HEMOGLOBIN A1C
Hgb A1c MFr Bld: 8 % — ABNORMAL HIGH (ref <5.7)
Mean Plasma Glucose: 183 mg/dL — ABNORMAL HIGH (ref <117)

## 2015-11-16 ENCOUNTER — Encounter: Payer: Self-pay | Admitting: Family Medicine

## 2015-11-16 ENCOUNTER — Ambulatory Visit (INDEPENDENT_AMBULATORY_CARE_PROVIDER_SITE_OTHER): Payer: Medicare Other | Admitting: Family Medicine

## 2015-11-16 VITALS — BP 152/80 | HR 78 | Temp 98.2°F | Resp 18 | Ht 64.0 in | Wt 180.0 lb

## 2015-11-16 DIAGNOSIS — E7800 Pure hypercholesterolemia, unspecified: Secondary | ICD-10-CM | POA: Diagnosis not present

## 2015-11-16 DIAGNOSIS — I10 Essential (primary) hypertension: Secondary | ICD-10-CM | POA: Diagnosis not present

## 2015-11-16 DIAGNOSIS — IMO0002 Reserved for concepts with insufficient information to code with codable children: Secondary | ICD-10-CM

## 2015-11-16 DIAGNOSIS — Z794 Long term (current) use of insulin: Secondary | ICD-10-CM | POA: Diagnosis not present

## 2015-11-16 DIAGNOSIS — N182 Chronic kidney disease, stage 2 (mild): Secondary | ICD-10-CM

## 2015-11-16 DIAGNOSIS — E1165 Type 2 diabetes mellitus with hyperglycemia: Secondary | ICD-10-CM

## 2015-11-16 DIAGNOSIS — R159 Full incontinence of feces: Secondary | ICD-10-CM

## 2015-11-16 DIAGNOSIS — E1122 Type 2 diabetes mellitus with diabetic chronic kidney disease: Secondary | ICD-10-CM | POA: Diagnosis not present

## 2015-11-16 MED ORDER — PITAVASTATIN CALCIUM 4 MG PO TABS: 1.0000 | ORAL_TABLET | Freq: Every day | ORAL | Status: DC

## 2015-11-16 NOTE — Progress Notes (Signed)
Subjective:    Patient ID: Brenda Moreno, female    DOB: 1946-01-14, 70 y.o.   MRN: [id number]  HPI  Patient's hemoglobin A1c has risen to 8.  She is using Levemir 30 units a day but she is not doing NovoLog with meals. Her fasting blood sugars are less than 100 but she is not checking her two-hour postprandial sugars. Her most recent lab work as listed below but her LDL cholesterol is greater than 200. Past medical history is significant for ASCVD and peripheral vascular disease as well as a history of myocardial infarction. Therefore her goal LDL cholesterol is less than 70.  She quit livalo due to insurance > 3 months ago. Past Medical History  Diagnosis Date  . Leg pain   . Joint pain   . Carotid artery occlusion   . CAD (coronary artery disease)     S/P cabg  . Peripheral vascular disease ([place])   . Arthritis   . CHF (congestive heart failure) ([place])   . Diabetes mellitus   . Hyperlipidemia   . Hypertension   . Myocardial infarction ([place])   . PVD (peripheral vascular disease) ([place])   . DDD (degenerative disc disease), lumbar    Past Surgical History  Procedure Laterality Date  . Coronary artery bypass graft      1998  . Appendectomy    . Tubal ligation    . Cholecystectomy      Gall bladder  . Pr vein bypass graft,aorto-fem-pop     Current Outpatient Prescriptions on File Prior to Visit  Medication Sig Dispense Refill  . amLODipine (NORVASC) 5 MG tablet TAKE 1 TABLET (5 MG TOTAL) BY MOUTH DAILY. 90 tablet 3  . amoxicillin-clavulanate (AUGMENTIN) 875-125 MG tablet Take 1 tablet by mouth 2 (two) times daily. 20 tablet 0  . aspirin EC 81 MG tablet Take 81 mg by mouth daily.      . canagliflozin (INVOKANA) 300 MG TABS tablet Take 300 mg by mouth daily. 90 tablet 3  . CINNAMON PO Take 1,000 mg by mouth 2 (two) times daily.      . clotrimazole-betamethasone (LOTRISONE) cream Apply 1 application topically 2 (two) times daily. 30 g 0  . Coenzyme Q10 (CO Q 10 PO) Take 100 mg by  mouth daily.      . cyclobenzaprine (FLEXERIL) 10 MG tablet Take 1 tablet (10 mg total) by mouth 3 (three) times daily as needed for muscle spasms. 30 tablet 0  . fluticasone (FLONASE) 50 MCG/ACT nasal spray PLACE 2 SPRAYS INTO BOTH NOSTRILS DAILY. 0.5 g 2  . furosemide (LASIX) 20 MG tablet Take 1 tablet by mouth  daily 60 tablet 11  . HYDROcodone-acetaminophen (NORCO/VICODIN) 5-325 MG per tablet Take 2 tablets by mouth every 4 (four) hours as needed. (Patient taking differently: Take 2 tablets by mouth as needed. ) 10 tablet 0  . insulin aspart (NOVOLOG FLEXPEN) 100 UNIT/ML FlexPen 7 units with supper 15 mL 11  . Insulin Detemir (LEVEMIR FLEXTOUCH) 100 UNIT/ML Pen Inject 30 Units into the skin daily at 10 pm. 45 mL 3  . INVOKANA 300 MG TABS tablet TAKE 1 TABLET BY MOUTH EVERY DAY 30 tablet 3  . Lactobacillus Casei-Folic Acid (RESTORA RX) 60-1.25 MG CAPS Take by mouth.    Marland Kitchen LEVEMIR 100 UNIT/ML injection INJECT 0.3MLS (30 UNITS TOTAL) INTO THE SKIN ATBEDTIME 10 mL 3  . loperamide (IMODIUM A-D) 2 MG tablet Take 2 mg by mouth 4 (four) times daily as needed  for diarrhea or loose stools.    . metFORMIN (GLUCOPHAGE) 1000 MG tablet TAKE 1 TABLETBY MOUTH TWICE A DAY WITH MEALS 180 tablet 3  . metoprolol succinate (TOPROL-XL) 100 MG 24 hr tablet Take 1 tablet (100 mg total) by mouth daily. Take with or immediately following a meal. 90 tablet 2  . naproxen (NAPROSYN) 500 MG tablet Take 1 tablet (500 mg total) by mouth 2 (two) times daily. 30 tablet 0  . nitroGLYCERIN (NITROSTAT) 0.4 MG SL tablet Place 1 tablet (0.4 mg total) under the tongue every 5 (five) minutes as needed for chest pain. 90 tablet 2  . pioglitazone (ACTOS) 30 MG tablet Take 1 tablet (30 mg total) by mouth daily. 90 tablet 1  . promethazine (PHENERGAN) 12.5 MG tablet Take 1 tablet (12.5 mg total) by mouth every 6 (six) hours as needed for nausea or vomiting. 30 tablet 0  . quinapril (ACCUPRIL) 40 MG tablet TAKE 1 TABLET BY MOUTH TWICE A DAY  180 tablet 1  . traMADol (ULTRAM) 50 MG tablet Take 1 tablet (50 mg total) by mouth every 6 (six) hours as needed. 60 tablet 1   No current facility-administered medications on file prior to visit.   Allergies  Allergen Reactions  . Levaquin [Levofloxacin In D5w] Other (See Comments)    Pain all over and in joints  . Statins Other (See Comments)    Severe myalgias to lipitor, crestor, pravastatin and weakness and cramping  in bilateral leg.  [name] [Linagliptin] Itching   Social History   Social History  . Marital Status: Divorced    Spouse Name: N/A  . Number of Children: N/A  . Years of Education: N/A   Occupational History  . Not on file.   Social History Main Topics  . Smoking status: Former Smoker -- 0.25 packs/day    Types: Cigarettes    Quit date: 09/13/2011  . Smokeless tobacco: Never Used  . Alcohol Use: No  . Drug Use: No  . Sexual Activity: Not on file   Other Topics Concern  . Not on file   Social History Narrative      Review of Systems  All other systems reviewed and are negative.      Objective:   Physical Exam  Constitutional: She appears well-developed and well-nourished. No distress.  Cardiovascular: Normal rate, regular rhythm and normal heart sounds.   No murmur heard. Pulmonary/Chest: Effort normal and breath sounds normal. No respiratory distress. She has no wheezes. She has no rales. She exhibits no tenderness.  Abdominal: Soft. Bowel sounds are normal. She exhibits no distension. There is no tenderness. There is no rebound and no guarding.  Musculoskeletal: She exhibits edema.  Skin: She is not diaphoretic.  Vitals reviewed.         Assessment & Plan:  HYPERCHOLESTEROLEMIA  Uncontrolled type 2 diabetes mellitus with stage 2 chronic kidney disease, with long-term current use of insulin (HCC)  Benign essential HTN  Fecal incontinence  Resume NovoLog 5 units with breakfast and 5 units with supper. Check sugars 2 hours  after meals. Goal postprandial sugars 2 hours after meals are less than 160. I would titrate NovoLog to achieve this.  Resume livalo and recheck cholesterol in 3 months. Patient also reports irritable bowel syndrome with diarrhea 4-5 times a day and crampy lower abdominal pain. She is also having fecal incontinence. Imodium is not helping.  Try viberzi 100 mg pobid and recheck in 2 weeks

## 2015-11-30 ENCOUNTER — Other Ambulatory Visit: Payer: Self-pay | Admitting: Family Medicine

## 2016-01-16 ENCOUNTER — Other Ambulatory Visit: Payer: Self-pay | Admitting: Family Medicine

## 2016-01-18 ENCOUNTER — Other Ambulatory Visit: Payer: Medicare Other

## 2016-01-18 DIAGNOSIS — I10 Essential (primary) hypertension: Secondary | ICD-10-CM | POA: Diagnosis not present

## 2016-01-18 DIAGNOSIS — E785 Hyperlipidemia, unspecified: Secondary | ICD-10-CM | POA: Diagnosis not present

## 2016-01-18 DIAGNOSIS — E1165 Type 2 diabetes mellitus with hyperglycemia: Principal | ICD-10-CM

## 2016-01-18 DIAGNOSIS — E1151 Type 2 diabetes mellitus with diabetic peripheral angiopathy without gangrene: Secondary | ICD-10-CM | POA: Diagnosis not present

## 2016-01-18 DIAGNOSIS — IMO0002 Reserved for concepts with insufficient information to code with codable children: Secondary | ICD-10-CM

## 2016-01-18 LAB — CBC WITH DIFFERENTIAL/PLATELET
BASOS PCT: 1 %
Basophils Absolute: 55 cells/uL (ref 0–200)
Eosinophils Absolute: 55 cells/uL (ref 15–500)
Eosinophils Relative: 1 %
HCT: 45.7 % — ABNORMAL HIGH (ref 35.0–45.0)
Hemoglobin: 14.9 g/dL (ref 12.0–15.0)
Lymphocytes Relative: 28 %
Lymphs Abs: 1540 cells/uL (ref 850–3900)
MCH: 27.3 pg (ref 27.0–33.0)
MCHC: 32.6 g/dL (ref 32.0–36.0)
MCV: 83.9 fL (ref 80.0–100.0)
MONO ABS: 495 {cells}/uL (ref 200–950)
MONOS PCT: 9 %
MPV: 10.4 fL (ref 7.5–12.5)
Neutro Abs: 3355 cells/uL (ref 1500–7800)
Neutrophils Relative %: 61 %
Platelets: 182 10*3/uL (ref 140–400)
RBC: 5.45 MIL/uL — AB (ref 3.80–5.10)
RDW: 17 % — AB (ref 11.0–15.0)
WBC: 5.5 10*3/uL (ref 3.8–10.8)

## 2016-01-18 LAB — COMPLETE METABOLIC PANEL WITH GFR
ALT: 10 U/L (ref 6–29)
AST: 15 U/L (ref 10–35)
Albumin: 4.1 g/dL (ref 3.6–5.1)
Alkaline Phosphatase: 71 U/L (ref 33–130)
BUN: 22 mg/dL (ref 7–25)
CHLORIDE: 108 mmol/L (ref 98–110)
CO2: 25 mmol/L (ref 20–31)
Calcium: 9.6 mg/dL (ref 8.6–10.4)
Creat: 1.43 mg/dL — ABNORMAL HIGH (ref 0.50–0.99)
GFR, Est African American: 44 mL/min — ABNORMAL LOW (ref >=60)
GFR, Est Non African American: 38 mL/min — ABNORMAL LOW (ref >=60)
GLUCOSE: 78 mg/dL (ref 70–99)
Potassium: 4.3 mmol/L (ref 3.5–5.3)
Sodium: 145 mmol/L (ref 135–146)
Total Bilirubin: 0.4 mg/dL (ref 0.2–1.2)
Total Protein: 6.7 g/dL (ref 6.1–8.1)

## 2016-01-18 LAB — LIPID PANEL
CHOL/HDL RATIO: 3 ratio (ref <=5.0)
Cholesterol: 142 mg/dL (ref 125–200)
HDL: 48 mg/dL (ref >=46)
LDL Cholesterol: 71 mg/dL (ref <130)
Triglycerides: 115 mg/dL (ref <150)
VLDL: 23 mg/dL (ref <30)

## 2016-01-19 LAB — HEMOGLOBIN A1C
Hgb A1c MFr Bld: 7.7 % — ABNORMAL HIGH (ref <5.7)
Mean Plasma Glucose: 174 mg/dL

## 2016-01-21 ENCOUNTER — Encounter: Payer: Self-pay | Admitting: Family Medicine

## 2016-01-21 ENCOUNTER — Ambulatory Visit (INDEPENDENT_AMBULATORY_CARE_PROVIDER_SITE_OTHER): Payer: Medicare Other | Admitting: Family Medicine

## 2016-01-21 VITALS — BP 144/82 | HR 76 | Temp 98.1°F | Resp 16 | Ht 64.0 in | Wt 179.0 lb

## 2016-01-21 DIAGNOSIS — I10 Essential (primary) hypertension: Secondary | ICD-10-CM

## 2016-01-21 DIAGNOSIS — IMO0002 Reserved for concepts with insufficient information to code with codable children: Secondary | ICD-10-CM

## 2016-01-21 DIAGNOSIS — E7800 Pure hypercholesterolemia, unspecified: Secondary | ICD-10-CM

## 2016-01-21 DIAGNOSIS — I739 Peripheral vascular disease, unspecified: Secondary | ICD-10-CM | POA: Diagnosis not present

## 2016-01-21 DIAGNOSIS — Z794 Long term (current) use of insulin: Secondary | ICD-10-CM | POA: Diagnosis not present

## 2016-01-21 DIAGNOSIS — I2510 Atherosclerotic heart disease of native coronary artery without angina pectoris: Secondary | ICD-10-CM | POA: Diagnosis not present

## 2016-01-21 DIAGNOSIS — N182 Chronic kidney disease, stage 2 (mild): Secondary | ICD-10-CM

## 2016-01-21 DIAGNOSIS — Z0000 Encounter for general adult medical examination without abnormal findings: Secondary | ICD-10-CM | POA: Diagnosis not present

## 2016-01-21 DIAGNOSIS — E1165 Type 2 diabetes mellitus with hyperglycemia: Secondary | ICD-10-CM

## 2016-01-21 DIAGNOSIS — E1122 Type 2 diabetes mellitus with diabetic chronic kidney disease: Secondary | ICD-10-CM

## 2016-01-21 NOTE — Progress Notes (Signed)
Subjective:    Patient ID: Brenda Moreno, female    DOB: 1945/09/12, 70 y.o.   MRN: [id number]  HPI   She is here today for CPE.  Last mammogram 10/2015.  Last colonoscopy 2008.  Her Pap smear was performed 2015 and is up-to-date.  Patient reports that her fasting blood sugars are approximately 90-100 in the morning. She is also having hypoglycemic episodes in the evening after she takes 7 units of NovoLog with supper. However her hemoglobin A1c is still slightly elevated at 7.7 she has drastically changed her diet and is eating more fruits and salads. This is evidenced in her substantial drop in her cholesterol. Please see the lab work below. The remainder of her cancer screening is up-to-date. Diabetic foot exam is performed today and is normal. Viberzi 100 mg twice a day is helping substantially with her diarrhea Immunization History  Administered Date(s) Administered  . Influenza,inj,Quad PF,36+ Mos 06/04/2013, 07/03/2015  . Pneumococcal Conjugate-13 10/21/2013  . Pneumococcal Polysaccharide-23 01/15/2015  . Zoster 06/15/2011    Past Medical History  Diagnosis Date  . Leg pain   . Joint pain   . Carotid artery occlusion   . CAD (coronary artery disease)     S/P cabg  . Peripheral vascular disease ([place])   . Arthritis   . CHF (congestive heart failure) ([place])   . Diabetes mellitus   . Hyperlipidemia   . Hypertension   . Myocardial infarction ([place])   . PVD (peripheral vascular disease) ([place])   . DDD (degenerative disc disease), lumbar    Past Surgical History  Procedure Laterality Date  . Coronary artery bypass graft      1998  . Appendectomy    . Tubal ligation    . Cholecystectomy      Gall bladder  . Pr vein bypass graft,aorto-fem-pop     Current Outpatient Prescriptions on File Prior to Visit  Medication Sig Dispense Refill  . amLODipine (NORVASC) 5 MG tablet TAKE 1 TABLET (5 MG TOTAL) BY MOUTH DAILY. 90 tablet 3  . amoxicillin-clavulanate (AUGMENTIN) 875-125 MG  tablet Take 1 tablet by mouth 2 (two) times daily. 20 tablet 0  . aspirin EC 81 MG tablet Take 81 mg by mouth daily.      Marland Kitchen CINNAMON PO Take 1,000 mg by mouth 2 (two) times daily.      . clotrimazole-betamethasone (LOTRISONE) cream Apply 1 application topically 2 (two) times daily. 30 g 0  . Coenzyme Q10 (CO Q 10 PO) Take 100 mg by mouth daily.      . cyclobenzaprine (FLEXERIL) 10 MG tablet Take 1 tablet (10 mg total) by mouth 3 (three) times daily as needed for muscle spasms. 30 tablet 0  . fluticasone (FLONASE) 50 MCG/ACT nasal spray PLACE 2 SPRAYS INTO BOTH NOSTRILS DAILY. 16 g 11  . furosemide (LASIX) 20 MG tablet Take 1 tablet by mouth  daily 60 tablet 11  . furosemide (LASIX) 20 MG tablet TAKE 1 TABLET EVERY DAY 30 tablet 5  . HYDROcodone-acetaminophen (NORCO/VICODIN) 5-325 MG per tablet Take 2 tablets by mouth every 4 (four) hours as needed. (Patient taking differently: Take 2 tablets by mouth as needed. ) 10 tablet 0  . insulin aspart (NOVOLOG FLEXPEN) 100 UNIT/ML FlexPen 7 units with supper 15 mL 11  . INVOKANA 300 MG TABS tablet TAKE 1 TABLET BY MOUTH EVERY DAY 30 tablet 3  . INVOKANA 300 MG TABS tablet Take 1 tablet by mouth  daily 90 tablet 1  .  Lactobacillus Casei-Folic Acid (RESTORA RX) 60-1.25 MG CAPS Take by mouth.    Marland Kitchen LEVEMIR 100 UNIT/ML injection INJECT 0.3MLS (30 UNITS TOTAL) INTO THE SKIN ATBEDTIME 10 mL 3  . LEVEMIR FLEXTOUCH 100 UNIT/ML Pen Inject subcutaneously 30  units daily at 10:00pm 30 mL 1  . loperamide (IMODIUM A-D) 2 MG tablet Take 2 mg by mouth 4 (four) times daily as needed for diarrhea or loose stools.    . metFORMIN (GLUCOPHAGE) 1000 MG tablet Take 1 tablet by mouth  twice a day with meals 180 tablet 1  . metoprolol succinate (TOPROL-XL) 100 MG 24 hr tablet Take 1 tablet by mouth  daily with or immediatley  following a meal 90 tablet 4  . naproxen (NAPROSYN) 500 MG tablet Take 1 tablet (500 mg total) by mouth 2 (two) times daily. 30 tablet 0  . nitroGLYCERIN  (NITROSTAT) 0.4 MG SL tablet Place 1 tablet (0.4 mg total) under the tongue every 5 (five) minutes as needed for chest pain. 90 tablet 2  . pioglitazone (ACTOS) 30 MG tablet Take 1 tablet by mouth  daily 90 tablet 1  . Pitavastatin Calcium (LIVALO) 4 MG TABS Take 1 tablet (4 mg total) by mouth daily. 90 tablet 3  . promethazine (PHENERGAN) 12.5 MG tablet Take 1 tablet (12.5 mg total) by mouth every 6 (six) hours as needed for nausea or vomiting. 30 tablet 0  . quinapril (ACCUPRIL) 40 MG tablet TAKE 1 TABLET BY MOUTH TWICE A DAY 180 tablet 1  . traMADol (ULTRAM) 50 MG tablet Take 1 tablet (50 mg total) by mouth every 6 (six) hours as needed. 60 tablet 1   No current facility-administered medications on file prior to visit.   Allergies  Allergen Reactions  . Levaquin [Levofloxacin In D5w] Other (See Comments)    Pain all over and in joints  . Statins Other (See Comments)    Severe myalgias to lipitor, crestor, pravastatin and weakness and cramping  in bilateral leg.  [name] [Linagliptin] Itching   Social History   Social History  . Marital Status: Divorced    Spouse Name: N/A  . Number of Children: N/A  . Years of Education: N/A   Occupational History  . Not on file.   Social History Main Topics  . Smoking status: Former Smoker -- 0.25 packs/day    Types: Cigarettes    Quit date: 09/13/2011  . Smokeless tobacco: Never Used  . Alcohol Use: No  . Drug Use: No  . Sexual Activity: Not on file   Other Topics Concern  . Not on file   Social History Narrative   Family History  Problem Relation Age of Onset  . Heart disease Mother   . Diabetes Mother   . Hyperlipidemia Mother   . Hypertension Mother   . Heart disease Father     Heart Disease before age 20  . Diabetes Father   . Hyperlipidemia Father   . Hypertension Father   . Heart disease Sister     heart attack  . Diabetes Sister     Amputation  . Hypertension Sister   . Other Sister     history of amputation    . Heart attack Sister   . Coronary artery disease Other   . Diabetes Brother   . Hypertension Brother   . Heart disease Brother 67    Before age 66  . Hyperlipidemia Brother   . Diabetes Brother     scirrosis of liver  Review of Systems  All other systems reviewed and are negative.      Objective:   Physical Exam  Constitutional: She is oriented to person, place, and time. She appears well-developed and well-nourished. No distress.  HENT:  Head: Normocephalic and atraumatic.  Right Ear: External ear normal.  Left Ear: External ear normal.  Nose: Nose normal.  Mouth/Throat: Oropharynx is clear and moist. No oropharyngeal exudate.  Eyes: Conjunctivae and EOM are normal. Pupils are equal, round, and reactive to light. Right eye exhibits no discharge. Left eye exhibits no discharge. No scleral icterus.  Neck: Normal range of motion. Neck supple. No JVD present. No tracheal deviation present. No thyromegaly present.  Cardiovascular: Normal rate, regular rhythm, normal heart sounds and intact distal pulses.  Exam reveals no gallop and no friction rub.   No murmur heard. Pulmonary/Chest: Effort normal and breath sounds normal. No respiratory distress. She has no wheezes. She has no rales. She exhibits no tenderness.  Abdominal: Soft. Bowel sounds are normal. She exhibits no distension and no mass. There is no tenderness. There is no rebound and no guarding.  Musculoskeletal: Normal range of motion. She exhibits no edema or tenderness.  Lymphadenopathy:    She has no cervical adenopathy.  Neurological: She is alert and oriented to person, place, and time. She has normal reflexes. No cranial nerve deficit. She exhibits normal muscle tone. Coordination normal.  Skin: Skin is warm. No rash noted. She is not diaphoretic. No erythema. No pallor.  Psychiatric: She has a normal mood and affect. Her behavior is normal. Judgment and thought content normal.  Vitals reviewed.  Appointment  on 01/18/2016  Component Date Value Ref Range Status  . WBC 01/18/2016 5.5  3.8 - 10.8 K/uL Final  . RBC 01/18/2016 5.45* 3.80 - 5.10 MIL/uL Final  . Hemoglobin 01/18/2016 14.9  12.0 - 15.0 g/dL Final  . HCT 01/18/2016 45.7* 35.0 - 45.0 % Final  . MCV 01/18/2016 83.9  80.0 - 100.0 fL Final  . MCH 01/18/2016 27.3  27.0 - 33.0 pg Final  . MCHC 01/18/2016 32.6  32.0 - 36.0 g/dL Final  . RDW 01/18/2016 17.0* 11.0 - 15.0 % Final  . Platelets 01/18/2016 182  140 - 400 K/uL Final  . MPV 01/18/2016 10.4  7.5 - 12.5 fL Final  . Neutro Abs 01/18/2016 3355  1500 - 7800 cells/uL Final  . Lymphs Abs 01/18/2016 1540  850 - 3900 cells/uL Final  . Monocytes Absolute 01/18/2016 495  200 - 950 cells/uL Final  . Eosinophils Absolute 01/18/2016 55  15 - 500 cells/uL Final  . Basophils Absolute 01/18/2016 55  0 - 200 cells/uL Final  . Neutrophils Relative % 01/18/2016 61   Final  . Lymphocytes Relative 01/18/2016 28   Final  . Monocytes Relative 01/18/2016 9   Final  . Eosinophils Relative 01/18/2016 1   Final  . Basophils Relative 01/18/2016 1   Final  . Smear Review 01/18/2016 Criteria for review not met   Final   ** Please note change in unit of measure and reference range(s). **  . Sodium 01/18/2016 145  135 - 146 mmol/L Final  . Potassium 01/18/2016 4.3  3.5 - 5.3 mmol/L Final  . Chloride 01/18/2016 108  98 - 110 mmol/L Final  . CO2 01/18/2016 25  20 - 31 mmol/L Final  . Glucose, Bld 01/18/2016 78  70 - 99 mg/dL Final  . BUN 01/18/2016 22  7 - 25 mg/dL Final  . Creat 01/18/2016  1.43* 0.50 - 0.99 mg/dL Final  . Total Bilirubin 01/18/2016 0.4  0.2 - 1.2 mg/dL Final  . Alkaline Phosphatase 01/18/2016 71  33 - 130 U/L Final  . AST 01/18/2016 15  10 - 35 U/L Final  . ALT 01/18/2016 10  6 - 29 U/L Final  . Total Protein 01/18/2016 6.7  6.1 - 8.1 g/dL Final  . Albumin 01/18/2016 4.1  3.6 - 5.1 g/dL Final  . Calcium 01/18/2016 9.6  8.6 - 10.4 mg/dL Final  . GFR, Est African American 01/18/2016 44* >=60  mL/min Final  . GFR, Est Non African American 01/18/2016 38* >=60 mL/min Final   Comment:   The estimated GFR is a calculation valid for adults (>=64 years old) that uses the CKD-EPI algorithm to adjust for age and sex. It is   not to be used for children, pregnant women, hospitalized patients,    patients on dialysis, or with rapidly changing kidney function. According to the NKDEP, eGFR >89 is normal, 60-89 shows mild impairment, 30-59 shows moderate impairment, 15-29 shows severe impairment and <15 is ESRD.     Marland Kitchen Hgb A1c MFr Bld 01/18/2016 7.7* <5.7 % Final   Comment:   For someone without known diabetes, a hemoglobin A1c value of 6.5% or greater indicates that they may have diabetes and this should be confirmed with a follow-up test.   For someone with known diabetes, a value <7% indicates that their diabetes is well controlled and a value greater than or equal to 7% indicates suboptimal control. A1c targets should be individualized based on duration of diabetes, age, comorbid conditions, and other considerations.   Currently, no consensus exists for use of hemoglobin A1c for diagnosis of diabetes for children.     . Mean Plasma Glucose 01/18/2016 174   Final  . Cholesterol 01/18/2016 142  125 - 200 mg/dL Final  . Triglycerides 01/18/2016 115  <150 mg/dL Final  . HDL 01/18/2016 48  >=46 mg/dL Final  . Total CHOL/HDL Ratio 01/18/2016 3.0  <=5.0 Ratio Final  . VLDL 01/18/2016 23  <30 mg/dL Final  . LDL Cholesterol 01/18/2016 71  <130 mg/dL Final   Comment:   Total Cholesterol/HDL Ratio:CHD Risk                        Coronary Heart Disease Risk Table                                        Men       Women          1/2 Average Risk              3.4        3.3              Average Risk              5.0        4.4           2X Average Risk              9.6        7.1           3X Average Risk             23.4       11.0 Use the calculated Patient Ratio above and the  CHD Risk  table  to determine the patient's CHD Risk.           Assessment & Plan:  Uncontrolled type 2 diabetes mellitus with stage 2 chronic kidney disease, with long-term current use of insulin (HCC)  Benign essential HTN  HYPERCHOLESTEROLEMIA  Routine general medical examination at a health care facility  ASCVD (arteriosclerotic cardiovascular disease)  PVD (peripheral vascular disease) ([place])  Her goal LDL cholesterol is less than 70 given her history of peripheral vascular disease and ASCVD. This is the best her cholesterol has looked since I have known her. I'm extremely happy with this. Her blood pressures borderline and she would like to try to work on that with exercise and diet. Her diabetic foot exam is normal today. I recommended an annual diabetic eye exam. She declined hepatitis C screening. Immunizations are up-to-date. We will continue her diabetes medication but we will change NovoLog to 5 units with breakfast and 5 units with supper to try to decrease the risk of hypoglycemia at night and to help address postprandial hyperglycemia that seems to be causing her A1c to be above goal

## 2016-03-15 DIAGNOSIS — E113293 Type 2 diabetes mellitus with mild nonproliferative diabetic retinopathy without macular edema, bilateral: Secondary | ICD-10-CM | POA: Diagnosis not present

## 2016-03-15 LAB — HM DIABETES EYE EXAM

## 2016-03-31 ENCOUNTER — Encounter: Payer: Self-pay | Admitting: Family Medicine

## 2016-04-25 ENCOUNTER — Other Ambulatory Visit: Payer: Self-pay | Admitting: Family Medicine

## 2016-04-27 ENCOUNTER — Other Ambulatory Visit: Payer: Self-pay | Admitting: *Deleted

## 2016-04-27 MED ORDER — AMLODIPINE BESYLATE 5 MG PO TABS: ORAL_TABLET | ORAL | 3 refills | Status: DC

## 2016-04-27 NOTE — Telephone Encounter (Signed)
Received fax requesting refill on Norvasc.   Refill appropriate and filled per protocol.  

## 2016-05-03 ENCOUNTER — Emergency Department (HOSPITAL_COMMUNITY)
Admission: EM | Admit: 2016-05-03 | Discharge: 2016-05-03 | Disposition: A | Discharge status: Home / Self Care | Payer: Medicare Other | Attending: Emergency Medicine | Admitting: Emergency Medicine

## 2016-05-03 ENCOUNTER — Encounter: Payer: Self-pay | Admitting: Family Medicine

## 2016-05-03 ENCOUNTER — Ambulatory Visit (INDEPENDENT_AMBULATORY_CARE_PROVIDER_SITE_OTHER): Payer: Medicare Other | Admitting: Family Medicine

## 2016-05-03 ENCOUNTER — Encounter (HOSPITAL_COMMUNITY): Payer: Self-pay | Admitting: Emergency Medicine

## 2016-05-03 ENCOUNTER — Emergency Department (HOSPITAL_COMMUNITY): Payer: Medicare Other

## 2016-05-03 VITALS — BP 140/60 | HR 78 | Temp 98.3°F | Resp 18 | Ht 64.0 in | Wt 197.0 lb

## 2016-05-03 DIAGNOSIS — Y929 Unspecified place or not applicable: Secondary | ICD-10-CM | POA: Diagnosis not present

## 2016-05-03 DIAGNOSIS — Z87891 Personal history of nicotine dependence: Secondary | ICD-10-CM | POA: Diagnosis not present

## 2016-05-03 DIAGNOSIS — N182 Chronic kidney disease, stage 2 (mild): Secondary | ICD-10-CM

## 2016-05-03 DIAGNOSIS — J45901 Unspecified asthma with (acute) exacerbation: Secondary | ICD-10-CM

## 2016-05-03 DIAGNOSIS — R001 Bradycardia, unspecified: Secondary | ICD-10-CM | POA: Insufficient documentation

## 2016-05-03 DIAGNOSIS — I110 Hypertensive heart disease with heart failure: Secondary | ICD-10-CM | POA: Diagnosis not present

## 2016-05-03 DIAGNOSIS — S93401A Sprain of unspecified ligament of right ankle, initial encounter: Secondary | ICD-10-CM

## 2016-05-03 DIAGNOSIS — S99912A Unspecified injury of left ankle, initial encounter: Secondary | ICD-10-CM | POA: Diagnosis not present

## 2016-05-03 DIAGNOSIS — X501XXA Overexertion from prolonged static or awkward postures, initial encounter: Secondary | ICD-10-CM | POA: Insufficient documentation

## 2016-05-03 DIAGNOSIS — E7800 Pure hypercholesterolemia, unspecified: Secondary | ICD-10-CM | POA: Diagnosis not present

## 2016-05-03 DIAGNOSIS — E1165 Type 2 diabetes mellitus with hyperglycemia: Secondary | ICD-10-CM | POA: Diagnosis not present

## 2016-05-03 DIAGNOSIS — Y9301 Activity, walking, marching and hiking: Secondary | ICD-10-CM | POA: Insufficient documentation

## 2016-05-03 DIAGNOSIS — I509 Heart failure, unspecified: Secondary | ICD-10-CM | POA: Insufficient documentation

## 2016-05-03 DIAGNOSIS — I2510 Atherosclerotic heart disease of native coronary artery without angina pectoris: Secondary | ICD-10-CM | POA: Insufficient documentation

## 2016-05-03 DIAGNOSIS — R0789 Other chest pain: Secondary | ICD-10-CM | POA: Diagnosis not present

## 2016-05-03 DIAGNOSIS — E1122 Type 2 diabetes mellitus with diabetic chronic kidney disease: Secondary | ICD-10-CM

## 2016-05-03 DIAGNOSIS — Z7982 Long term (current) use of aspirin: Secondary | ICD-10-CM | POA: Insufficient documentation

## 2016-05-03 DIAGNOSIS — L304 Erythema intertrigo: Secondary | ICD-10-CM

## 2016-05-03 DIAGNOSIS — Z794 Long term (current) use of insulin: Secondary | ICD-10-CM | POA: Diagnosis not present

## 2016-05-03 DIAGNOSIS — I10 Essential (primary) hypertension: Secondary | ICD-10-CM

## 2016-05-03 DIAGNOSIS — S93402A Sprain of unspecified ligament of left ankle, initial encounter: Secondary | ICD-10-CM

## 2016-05-03 DIAGNOSIS — Z7984 Long term (current) use of oral hypoglycemic drugs: Secondary | ICD-10-CM | POA: Insufficient documentation

## 2016-05-03 DIAGNOSIS — Y999 Unspecified external cause status: Secondary | ICD-10-CM | POA: Insufficient documentation

## 2016-05-03 DIAGNOSIS — E119 Type 2 diabetes mellitus without complications: Secondary | ICD-10-CM | POA: Insufficient documentation

## 2016-05-03 DIAGNOSIS — IMO0002 Reserved for concepts with insufficient information to code with codable children: Secondary | ICD-10-CM

## 2016-05-03 MED ORDER — INSULIN LISPRO 100 UNIT/ML (KWIKPEN): 7.0000 [IU] | PEN_INJECTOR | Freq: Every day | SUBCUTANEOUS | 3 refills | Status: DC

## 2016-05-03 MED ORDER — AZITHROMYCIN 250 MG PO TABS: ORAL_TABLET | ORAL | 0 refills | Status: DC

## 2016-05-03 MED ORDER — PREDNISONE 20 MG PO TABS: ORAL_TABLET | ORAL | 0 refills | Status: DC

## 2016-05-03 MED ORDER — EMPAGLIFLOZIN 25 MG PO TABS
25.0000 mg | ORAL_TABLET | Freq: Every day | ORAL | 11 refills | Status: DC
Start: 2016-05-03 — End: 2016-05-10

## 2016-05-03 MED ORDER — CLOTRIMAZOLE-BETAMETHASONE 1-0.05 % EX CREA: 1.0000 "application " | TOPICAL_CREAM | Freq: Two times a day (BID) | CUTANEOUS | 0 refills | Status: DC

## 2016-05-03 NOTE — ED Notes (Signed)
Patient verbalizes understanding of discharge instructions, prescriptions, home care and follow up care. Patient out of department at this time. 

## 2016-05-03 NOTE — Discharge Instructions (Signed)
Elevate the ankle as often as possible, apply ice, take tylenol and follow up with Dr. Percell Miller.

## 2016-05-03 NOTE — ED Notes (Signed)
Patient ambulatory to restroom, independently without deficit.

## 2016-05-03 NOTE — ED Provider Notes (Signed)
[place] DEPT Provider Note   CSN: [id number] Arrival date & time: 05/03/16  2035     History   Chief Complaint Chief Complaint  Patient presents with  . Foot Pain    HPI Brenda Moreno is a 70 y.o. female who presents to the ED with left ankle pain. Patient reports that last week she was walking and stepped on a rock and turned her ankle. She states she did not fall but has had pian in the ankle since then. She has applied ice at first and the past 2 days has soaked in salt water. She has taken tylenol without relief. She denies other injuries.    Foot Pain     Past Medical History:  Diagnosis Date  . Arthritis   . CAD (coronary artery disease)    S/P cabg  . Carotid artery occlusion   . CHF (congestive heart failure) ([place])   . DDD (degenerative disc disease), lumbar   . Diabetes mellitus   . Hyperlipidemia   . Hypertension   . Joint pain   . Leg pain   . Myocardial infarction ([place])   . Peripheral vascular disease ([place])   . PVD (peripheral vascular disease) [facility])     Patient Active Problem List   Diagnosis Date Noted  . DDD (degenerative disc disease), lumbar   . Diarrhea 07/02/2014  . Fecal incontinence 07/02/2014  . Aftercare following surgery of the circulatory system, [place] 09/26/2013  . 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 Acute sinus infection 07/14/2013  . Arthritis   . CHF (congestive heart failure) ([place])   . Diabetes mellitus, type II, insulin dependent ([place])   . Hyperlipidemia   . Hypertension   . Myocardial infarction ([place])   . PVD (peripheral vascular disease) ([place])   . Peripheral vascular disease, unspecified ([place]) 09/27/2012  . Leg cramps 09/27/2012  . Atherosclerosis of native arteries of the extremities with intermittent claudication 09/22/2011  . DIABETES MELLITUS 11/22/2007  . HYPERCHOLESTEROLEMIA 11/22/2007  . HYPERTENSION 11/22/2007  . HEART ATTACK 11/22/2007  . CAD 11/22/2007  . RENAL CALCULUS 11/22/2007     Past Surgical History:  Procedure Laterality Date  . APPENDECTOMY    . CHOLECYSTECTOMY     Gall bladder  . CORONARY ARTERY BYPASS GRAFT     1998  . PR VEIN BYPASS GRAFT,AORTO-FEM-POP    . TUBAL LIGATION      OB History    No data available       Home Medications    Prior to Admission medications   Medication Sig Start Date End Date Taking? Authorizing Provider  amLODipine (NORVASC) 5 MG tablet TAKE 1 TABLET (5 MG TOTAL) BY MOUTH DAILY. 04/27/16   Susy Frizzle, MD  aspirin EC 81 MG tablet Take 81 mg by mouth daily.      Historical Provider, MD  azithromycin (ZITHROMAX) 250 MG tablet 2 tabs poqday1, 1 tab poqday 2-5 05/03/16   Susy Frizzle, MD  CINNAMON PO Take 1,000 mg by mouth 2 (two) times daily.      Historical Provider, MD  clotrimazole-betamethasone (LOTRISONE) cream Apply 1 application topically 2 (two) times daily. 11/03/15   Alycia Rossetti, MD  clotrimazole-betamethasone (LOTRISONE) cream Apply 1 application topically 2 (two) times daily. 05/03/16   Susy Frizzle, MD  Coenzyme Q10 (CO Q 10 PO) Take 100 mg by mouth daily.      Historical Provider, MD  cyclobenzaprine (FLEXERIL) 10 MG tablet Take 1 tablet (10  mg total) by mouth 3 (three) times daily as needed for muscle spasms. 05/26/14   Susy Frizzle, MD  empagliflozin (JARDIANCE) 25 MG TABS tablet Take 25 mg by mouth daily. 05/03/16   Susy Frizzle, MD  fluticasone (FLONASE) 50 MCG/ACT nasal spray PLACE 2 SPRAYS INTO BOTH NOSTRILS DAILY. 01/18/16   Susy Frizzle, MD  furosemide (LASIX) 20 MG tablet TAKE 1 TABLET EVERY DAY 01/18/16   Susy Frizzle, MD  insulin lispro (HUMALOG) 100 UNIT/ML KiwkPen Inject 0.07 mLs (7 Units total) into the skin daily with supper. 05/03/16   Susy Frizzle, MD  Lactobacillus Casei-Folic Acid (RESTORA RX) 60-1.25 MG CAPS Take by mouth.    Historical Provider, MD  LEVEMIR FLEXTOUCH 100 UNIT/ML Pen Inject subcutaneously 30  units daily at 10:00pm 11/30/15   Susy Frizzle, MD    loperamide (IMODIUM A-D) 2 MG tablet Take 2 mg by mouth 4 (four) times daily as needed for diarrhea or loose stools.    Historical Provider, MD  metFORMIN (GLUCOPHAGE) 1000 MG tablet Take 1 tablet by mouth  twice a day with meals 04/25/16   Susy Frizzle, MD  metoprolol succinate (TOPROL-XL) 100 MG 24 hr tablet Take 1 tablet by mouth  daily with or immediatley  following a meal 11/30/15   Susy Frizzle, MD  naproxen (NAPROSYN) 500 MG tablet Take 1 tablet (500 mg total) by mouth 2 (two) times daily. 03/21/15   Tanna Furry, MD  nitroGLYCERIN (NITROSTAT) 0.4 MG SL tablet Place 1 tablet (0.4 mg total) under the tongue every 5 (five) minutes as needed for chest pain. 11/12/14   Susy Frizzle, MD  pioglitazone (ACTOS) 30 MG tablet Take 1 tablet by mouth  daily 04/25/16   Susy Frizzle, MD  Pitavastatin Calcium (LIVALO) 4 MG TABS Take 1 tablet (4 mg total) by mouth daily. 11/16/15   Susy Frizzle, MD  predniSONE (DELTASONE) 20 MG tablet 3 tabs poqday 1-2, 2 tabs poqday 3-4, 1 tab poqday 5-6 05/03/16   Susy Frizzle, MD  promethazine (PHENERGAN) 12.5 MG tablet Take 1 tablet (12.5 mg total) by mouth every 6 (six) hours as needed for nausea or vomiting. 01/23/14   Susy Frizzle, MD  quinapril (ACCUPRIL) 40 MG tablet Take 1 tablet by mouth two  times daily 04/25/16   Susy Frizzle, MD  traMADol (ULTRAM) 50 MG tablet Take 1 tablet (50 mg total) by mouth every 6 (six) hours as needed. 03/27/14   Susy Frizzle, MD    Family History Family History  Problem Relation Age of Onset  . Heart disease Mother   . Diabetes Mother   . Hyperlipidemia Mother   . Hypertension Mother   . Heart disease Father     Heart Disease before age 39  . Diabetes Father   . Hyperlipidemia Father   . Hypertension Father   . Heart disease Sister     heart attack  . Diabetes Sister     Amputation  . Hypertension Sister   . Other Sister     history of amputation  . Heart attack Sister   . Diabetes Brother   .  Hypertension Brother   . Heart disease Brother 41    Before age 38  . Hyperlipidemia Brother   . Diabetes Brother     scirrosis of liver  . Coronary artery disease Other     Social History Social History  Substance Use Topics  . Smoking status: Former  Smoker    Packs/day: 0.25    Types: Cigarettes    Quit date: 09/13/2011  . Smokeless tobacco: Never Used  . Alcohol use No     Allergies   Levaquin [levofloxacin in d5w]; Statins; and Tradjenta [linagliptin]   Review of Systems Review of Systems  Constitutional: Negative for fever.  Musculoskeletal: Positive for arthralgias.       Left ankle pain  Skin: Negative for wound.     Physical Exam Updated Vital Signs BP 179/88 (BP Location: Right Arm)   Pulse (!) 56   Temp 98.7 F (37.1 C) (Oral)   Resp 18   Ht 5\' 4"  (1.626 m)   Wt 89.4 kg   SpO2 98%   BMI 33.81 kg/m   Physical Exam  Constitutional: She is oriented to person, place, and time. She appears well-developed and well-nourished.  Eyes: EOM are normal.  Neck: Neck supple.  Cardiovascular: Bradycardia present.   Pulmonary/Chest: Effort normal.  Musculoskeletal:       Left ankle: She exhibits swelling. She exhibits no deformity, no laceration and normal pulse. Decreased range of motion: due to pain. Tenderness. Lateral malleolus tenderness found. Achilles tendon normal.  Neurological: She is alert and oriented to person, place, and time. No cranial nerve deficit.  Skin: Skin is warm and dry.  Psychiatric: She has a normal mood and affect. Her behavior is normal.  Nursing note and vitals reviewed.    ED Treatments / Results  Labs (all labs ordered are listed, but only abnormal results are displayed) Labs Reviewed - No data to display  Radiology No results found.  Procedures Procedures (including critical care time)  Medications Ordered in ED Medications - No data to display   Initial Impression / Assessment and Plan / ED Course  I have reviewed  the triage vital signs and the nursing notes.  Clinical Course   Splint, ice pain management Final Clinical Impressions(s) / ED Diagnoses  70 y.o. female with left ankle pain s/p injury stable for d/c without focal neuro deficits. She will f/u with her PCP or orthopedic doctor or return here as needed.   Final diagnoses:  Ankle sprain, right, initial encounter    New Prescriptions Discharge Medication List as of 05/03/2016 10:46 PM       Ashley Murrain, NP 05/06/16 [place], MD 05/06/16 2239

## 2016-05-03 NOTE — Progress Notes (Signed)
Subjective:    Patient ID: Brenda Moreno, female    DOB: May 17, 1946, 70 y.o.   MRN: [id number]  HPI  Patient presents today with numerous complaints. Primary complaint is swelling in her left ankle. She has pronounced swelling around the lateral malleolus. Approximately 1 week ago, she suffered an inversion injury to her left ankle. She felt severe pain around the left lateral malleolus. She could barely walk afterwards. The swelling and pain has improved very little. Furthermore over the last week she is also developed shortness of breath. She has developed a cough productive of brown sputum. She has a history of tobacco abuse and likely underlying mild emphysema. Over the last week she has developed wheezing in addition to her chest congestion. She denies any fever. She denies any pleurisy. Her third complaint is a rash underneath both breasts. The rash is erythematous. It has the characteristic appearance of Candida intertrigo. As an aside after dealing with these 3 issues the patient also mentions 2 episodes of chest pain. She describes the sensation as a bear hug/squeezing sensation. It occurred unprovoked. It was associated with shortness of breath. It resolved spontaneously. The first time in improved after she drank some ginger ale. The second time it improved after several minutes. Past medical history significant for uncontrolled diabetes mellitus type 2, hyperlipidemia, coronary artery disease status post CABG. Past Medical History:  Diagnosis Date  . Arthritis   . CAD (coronary artery disease)    S/P cabg  . Carotid artery occlusion   . CHF (congestive heart failure) ([place])   . DDD (degenerative disc disease), lumbar   . Diabetes mellitus   . Hyperlipidemia   . Hypertension   . Joint pain   . Leg pain   . Myocardial infarction ([place])   . Peripheral vascular disease ([place])   . PVD (peripheral vascular disease) ([place])    Past Surgical History:  Procedure Laterality Date  .  APPENDECTOMY    . CHOLECYSTECTOMY     Gall bladder  . CORONARY ARTERY BYPASS GRAFT     1998  . PR VEIN BYPASS GRAFT,AORTO-FEM-POP    . TUBAL LIGATION     Current Outpatient Prescriptions on File Prior to Visit  Medication Sig Dispense Refill  . amLODipine (NORVASC) 5 MG tablet TAKE 1 TABLET (5 MG TOTAL) BY MOUTH DAILY. 90 tablet 3  . aspirin EC 81 MG tablet Take 81 mg by mouth daily.      Marland Kitchen CINNAMON PO Take 1,000 mg by mouth 2 (two) times daily.      . clotrimazole-betamethasone (LOTRISONE) cream Apply 1 application topically 2 (two) times daily. 30 g 0  . Coenzyme Q10 (CO Q 10 PO) Take 100 mg by mouth daily.      . cyclobenzaprine (FLEXERIL) 10 MG tablet Take 1 tablet (10 mg total) by mouth 3 (three) times daily as needed for muscle spasms. 30 tablet 0  . fluticasone (FLONASE) 50 MCG/ACT nasal spray PLACE 2 SPRAYS INTO BOTH NOSTRILS DAILY. 16 g 11  . furosemide (LASIX) 20 MG tablet TAKE 1 TABLET EVERY DAY 30 tablet 5  . Lactobacillus Casei-Folic Acid (RESTORA RX) 60-1.25 MG CAPS Take by mouth.    Marland Kitchen LEVEMIR FLEXTOUCH 100 UNIT/ML Pen Inject subcutaneously 30  units daily at 10:00pm 30 mL 1  . loperamide (IMODIUM A-D) 2 MG tablet Take 2 mg by mouth 4 (four) times daily as needed for diarrhea or loose stools.    . metFORMIN (GLUCOPHAGE) 1000 MG tablet Take 1  tablet by mouth  twice a day with meals 180 tablet 3  . metoprolol succinate (TOPROL-XL) 100 MG 24 hr tablet Take 1 tablet by mouth  daily with or immediatley  following a meal 90 tablet 4  . naproxen (NAPROSYN) 500 MG tablet Take 1 tablet (500 mg total) by mouth 2 (two) times daily. 30 tablet 0  . nitroGLYCERIN (NITROSTAT) 0.4 MG SL tablet Place 1 tablet (0.4 mg total) under the tongue every 5 (five) minutes as needed for chest pain. 90 tablet 2  . pioglitazone (ACTOS) 30 MG tablet Take 1 tablet by mouth  daily 90 tablet 3  . Pitavastatin Calcium (LIVALO) 4 MG TABS Take 1 tablet (4 mg total) by mouth daily. 90 tablet 3  . promethazine  (PHENERGAN) 12.5 MG tablet Take 1 tablet (12.5 mg total) by mouth every 6 (six) hours as needed for nausea or vomiting. 30 tablet 0  . quinapril (ACCUPRIL) 40 MG tablet Take 1 tablet by mouth two  times daily 180 tablet 3  . traMADol (ULTRAM) 50 MG tablet Take 1 tablet (50 mg total) by mouth every 6 (six) hours as needed. 60 tablet 1   No current facility-administered medications on file prior to visit.    Allergies  Allergen Reactions  . Levaquin [Levofloxacin In D5w] Other (See Comments)    Pain all over and in joints  . Statins Other (See Comments)    Severe myalgias to lipitor, crestor, pravastatin and weakness and cramping  in bilateral leg.  [name] [Linagliptin] Itching   Social History   Social History  . Marital status: Divorced    Spouse name: N/A  . Number of children: N/A  . Years of education: N/A   Occupational History  . Not on file.   Social History Main Topics  . Smoking status: Former Smoker    Packs/day: 0.25    Types: Cigarettes    Quit date: 09/13/2011  . Smokeless tobacco: Never Used  . Alcohol use No  . Drug use: No  . Sexual activity: Not on file   Other Topics Concern  . Not on file   Social History Narrative  . No narrative on file   Family History  Problem Relation Age of Onset  . Heart disease Mother   . Diabetes Mother   . Hyperlipidemia Mother   . Hypertension Mother   . Heart disease Father     Heart Disease before age 14  . Diabetes Father   . Hyperlipidemia Father   . Hypertension Father   . Heart disease Sister     heart attack  . Diabetes Sister     Amputation  . Hypertension Sister   . Other Sister     history of amputation  . Heart attack Sister   . Diabetes Brother   . Hypertension Brother   . Heart disease Brother 2    Before age 64  . Hyperlipidemia Brother   . Diabetes Brother     scirrosis of liver  . Coronary artery disease Other      Review of Systems  All other systems reviewed and are  negative.      Objective:   Physical Exam  Constitutional: She is oriented to person, place, and time. She appears well-developed and well-nourished. No distress.  HENT:  Head: Normocephalic and atraumatic.  Right Ear: External ear normal.  Left Ear: External ear normal.  Nose: Nose normal.  Mouth/Throat: Oropharynx is clear and moist. No oropharyngeal exudate.  Eyes:  Conjunctivae and EOM are normal. Pupils are equal, round, and reactive to light. Right eye exhibits no discharge. Left eye exhibits no discharge. No scleral icterus.  Neck: Normal range of motion. Neck supple. No JVD present. No tracheal deviation present. No thyromegaly present.  Cardiovascular: Normal rate, regular rhythm, normal heart sounds and intact distal pulses.  Exam reveals no gallop and no friction rub.   No murmur heard. Pulmonary/Chest: Effort normal and breath sounds normal. No respiratory distress. She has no wheezes. She has no rales. She exhibits no tenderness.  Abdominal: Soft. Bowel sounds are normal. She exhibits no distension and no mass. There is no tenderness. There is no rebound and no guarding.  Musculoskeletal: Normal range of motion. She exhibits no edema or tenderness.  Lymphadenopathy:    She has no cervical adenopathy.  Neurological: She is alert and oriented to person, place, and time. She has normal reflexes. No cranial nerve deficit. She exhibits normal muscle tone. Coordination normal.  Skin: Skin is warm. No rash noted. She is not diaphoretic. No erythema. No pallor.  Psychiatric: She has a normal mood and affect. Her behavior is normal. Judgment and thought content normal.  Vitals reviewed.  No visits with results within 1 Month(s) from this visit.  Latest known visit with results is:  Abstract on 03/31/2016  Component Date Value Ref Range Status  . HM Diabetic Eye Exam 03/15/2016 No Retinopathy  No Retinopathy Final          Assessment & Plan:  Sprain of ankle, left, initial  encounter - Plan: DG Ankle Complete Left  Asthmatic bronchitis with acute exacerbation - Plan: azithromycin (ZITHROMAX) 250 MG tablet, predniSONE (DELTASONE) 20 MG tablet  Uncontrolled type 2 diabetes mellitus with stage 2 chronic kidney disease, with long-term current use of insulin ([place]) - Plan: Ambulatory referral to Cardiology  Benign essential HTN - Plan: Ambulatory referral to Cardiology  HYPERCHOLESTEROLEMIA - Plan: Ambulatory referral to Cardiology  ASCVD (arteriosclerotic cardiovascular disease) - Plan: Ambulatory referral to Cardiology  Intertrigo  Atypical chest pain - Plan: Ambulatory referral to Cardiology  My biggest concern is her underlying chest pain/chest pressure. Even though this was not associated with exertion, given her medical history and her risk factors, I believe that she needs to see her cardiologist for a stress test and I will schedule this. I believe she sprained her left ankle. I will proceed with an x-ray to rule out an occult fracture. If it is sprained, I would recommend an ASO brace for 2 weeks. I expect spontaneous resolution over that period of time. The rash under her breasts Candida intertrigo. I recommended Lotrisone cream twice daily for 2 weeks. I believe that she is also developed asthmatic bronchitis. Begin a Z-Pak for bronchitis in addition to a prednisone taper pack. Seek medical attention immediately if chest pain returns.

## 2016-05-03 NOTE — ED Triage Notes (Signed)
Pt states she twisted her left ankle on rock over one week ago and the area is still tender and swelling. Pt is ambulatory.

## 2016-05-10 ENCOUNTER — Other Ambulatory Visit: Payer: Self-pay | Admitting: Family Medicine

## 2016-05-10 MED ORDER — EMPAGLIFLOZIN 25 MG PO TABS: 25.0000 mg | ORAL_TABLET | Freq: Every day | ORAL | 3 refills | Status: DC

## 2016-05-19 ENCOUNTER — Encounter: Payer: Self-pay | Admitting: Cardiovascular Disease

## 2016-06-02 ENCOUNTER — Encounter: Payer: Self-pay | Admitting: Cardiovascular Disease

## 2016-06-02 ENCOUNTER — Ambulatory Visit (INDEPENDENT_AMBULATORY_CARE_PROVIDER_SITE_OTHER): Payer: Medicare Other | Admitting: Cardiovascular Disease

## 2016-06-02 VITALS — BP 154/100 | HR 56 | Ht 64.0 in | Wt 189.4 lb

## 2016-06-02 DIAGNOSIS — I10 Essential (primary) hypertension: Secondary | ICD-10-CM

## 2016-06-02 DIAGNOSIS — I2510 Atherosclerotic heart disease of native coronary artery without angina pectoris: Secondary | ICD-10-CM | POA: Diagnosis not present

## 2016-06-02 MED ORDER — POTASSIUM CHLORIDE ER 10 MEQ PO TBCR: 10.0000 meq | EXTENDED_RELEASE_TABLET | Freq: Every day | ORAL | 11 refills | Status: DC

## 2016-06-02 MED ORDER — HYDROCHLOROTHIAZIDE 25 MG PO TABS: 25.0000 mg | ORAL_TABLET | Freq: Every day | ORAL | 11 refills | Status: DC

## 2016-06-02 NOTE — Progress Notes (Signed)
Brenda Moreno Date of Birth  March 28, 1946 [facility] [address]    [place], [place]  [address] [phone]  Fax  [phone]  Problem list 1. Coronary artery disease-status post CABG 2.  Essential hypertension  Brenda Moreno a 70 year old female with a history of coronary artery disease but she status post coronary artery bypass grafting in 1998. I last saw her approximately 10 years ago. She presents today for further evaluation of mild chest pain.  The chest pains occurred with rest and lasted 5 minutes.  She did not take NTG.  She has been exercising at the [facility]  on a regular basis.  She had a stress test after I saw her in November which was negative for ischemia.  She still eats extra salt.    Oct. 5, 2017:  Brenda Moreno is seen today after 4 year absence. Had some mold issues with her house,   develped dyspnea and CP .   Was started on a steroid Dosepak and she's feeling somewhat better.  No episodes of angina .  No exercising.       Current Outpatient Prescriptions on File Prior to Visit  Medication Sig Dispense Refill  . amLODipine (NORVASC) 5 MG tablet TAKE 1 TABLET (5 MG TOTAL) BY MOUTH DAILY. 90 tablet 3  . aspirin EC 81 MG tablet Take 81 mg by mouth daily.      Marland Kitchen CINNAMON PO Take 1,000 mg by mouth 2 (two) times daily.      . clotrimazole-betamethasone (LOTRISONE) cream Apply 1 application topically 2 (two) times daily. 30 g 0  . Coenzyme Q10 (CO Q 10 PO) Take 100 mg by mouth daily.      . cyclobenzaprine (FLEXERIL) 10 MG tablet Take 1 tablet (10 mg total) by mouth 3 (three) times daily as needed for muscle spasms. 30 tablet 0  . empagliflozin (JARDIANCE) 25 MG TABS tablet Take 25 mg by mouth daily. 90 tablet 3  . fluticasone (FLONASE) 50 MCG/ACT nasal spray PLACE 2 SPRAYS INTO BOTH NOSTRILS DAILY. 16 g 11  . furosemide (LASIX) 20 MG tablet TAKE 1 TABLET EVERY DAY 30 tablet 5  . insulin lispro (HUMALOG) 100 UNIT/ML KiwkPen Inject 0.07 mLs (7 Units total) into the  skin daily with supper. 5 pen 3  . Lactobacillus Casei-Folic Acid (RESTORA RX) 60-1.25 MG CAPS Take by mouth.    Marland Kitchen LEVEMIR FLEXTOUCH 100 UNIT/ML Pen Inject subcutaneously 30  units daily at 10:00pm 30 mL 1  . loperamide (IMODIUM A-D) 2 MG tablet Take 2 mg by mouth 4 (four) times daily as needed for diarrhea or loose stools.    . metFORMIN (GLUCOPHAGE) 1000 MG tablet Take 1 tablet by mouth  twice a day with meals 180 tablet 3  . metoprolol succinate (TOPROL-XL) 100 MG 24 hr tablet Take 1 tablet by mouth  daily with or immediatley  following a meal 90 tablet 4  . naproxen (NAPROSYN) 500 MG tablet Take 1 tablet (500 mg total) by mouth 2 (two) times daily. 30 tablet 0  . nitroGLYCERIN (NITROSTAT) 0.4 MG SL tablet Place 1 tablet (0.4 mg total) under the tongue every 5 (five) minutes as needed for chest pain. 90 tablet 2  . pioglitazone (ACTOS) 30 MG tablet Take 1 tablet by mouth  daily 90 tablet 3  . Pitavastatin Calcium (LIVALO) 4 MG TABS Take 1 tablet (4 mg total) by mouth daily. 90 tablet 3  . quinapril (ACCUPRIL) 40 MG tablet Take 1 tablet by mouth two  times daily 180 tablet 3  . azithromycin (ZITHROMAX) 250 MG tablet 2 tabs poqday1, 1 tab poqday 2-5 (Patient not taking: Reported on 06/02/2016) 6 tablet 0  . predniSONE (DELTASONE) 20 MG tablet 3 tabs poqday 1-2, 2 tabs poqday 3-4, 1 tab poqday 5-6 (Patient not taking: Reported on 06/02/2016) 12 tablet 0  . promethazine (PHENERGAN) 12.5 MG tablet Take 1 tablet (12.5 mg total) by mouth every 6 (six) hours as needed for nausea or vomiting. (Patient not taking: Reported on 06/02/2016) 30 tablet 0  . traMADol (ULTRAM) 50 MG tablet Take 1 tablet (50 mg total) by mouth every 6 (six) hours as needed. (Patient not taking: Reported on 06/02/2016) 60 tablet 1   No current facility-administered medications on file prior to visit.     Allergies  Allergen Reactions  . Levaquin [Levofloxacin In D5w] Other (See Comments)    Pain all over and in joints  . Statins  Other (See Comments)    Severe myalgias to lipitor, crestor, pravastatin and weakness and cramping  in bilateral leg.  [name] [Linagliptin] Itching    Past Medical History:  Diagnosis Date  . Arthritis   . CAD (coronary artery disease)    S/P cabg  . Carotid artery occlusion   . CHF (congestive heart failure) ([place])   . DDD (degenerative disc disease), lumbar   . Diabetes mellitus   . Hyperlipidemia   . Hypertension   . Joint pain   . Leg pain   . Myocardial infarction   . Peripheral vascular disease ([place])   . PVD (peripheral vascular disease) ([place])     Past Surgical History:  Procedure Laterality Date  . APPENDECTOMY    . CHOLECYSTECTOMY     Gall bladder  . CORONARY ARTERY BYPASS GRAFT     1998  . PR VEIN BYPASS GRAFT,AORTO-FEM-POP    . TUBAL LIGATION      History  Smoking Status  . Former Smoker  . Packs/day: 0.25  . Types: Cigarettes  . Quit date: 09/13/2011  Smokeless Tobacco  . Never Used    History  Alcohol Use No    Family History  Problem Relation Age of Onset  . Heart disease Mother   . Diabetes Mother   . Hyperlipidemia Mother   . Hypertension Mother   . Heart disease Father     Heart Disease before age 59  . Diabetes Father   . Hyperlipidemia Father   . Hypertension Father   . Heart disease Sister     heart attack  . Diabetes Sister     Amputation  . Hypertension Sister   . Other Sister     history of amputation  . Heart attack Sister   . Diabetes Brother   . Hypertension Brother   . Heart disease Brother 22    Before age 19  . Hyperlipidemia Brother   . Diabetes Brother     scirrosis of liver  . Coronary artery disease Other     Reviw of Systems:  Reviewed in the HPI.  All other systems are negative.  Physical Exam: BP (!) 154/100 (BP Location: Left Arm, Patient Position: Sitting, Cuff Size: Normal)   Pulse (!) 56   Ht 5\' 4"  (1.626 m)   Wt 189 lb 6.4 oz (85.9 kg)   BMI 32.51 kg/m  The patient is alert and oriented x  3.  The mood and affect are normal.   Skin: warm and dry.  Color is normal.  HEENT:   Normocephalic, atraumatic. She has normal carotid speech is no JVD.  Lungs: Lungs are clear   Heart: Heart regular S1-S2    Abdomen: Abdominal exam is benign. She has good bowel sounds. There is no hepatosplenomegaly.  Extremities:  Shows no clubbing cyanosis or edema  Neuro:  Her neuro exam is nonfocal. Her gait is normal the    ECG: Normal sinus rhythm. Has no ST or T wave changes   Assessment / Plan:   1.  CAD - s/p CABG  2. Essential HTN:  Will try HCTZ 25 a day .   She isnt taking the lasix as directed because it causes her to go the the bath room so much .  Add kdur 10 meq a day   Nurse visit and BMP in 3-4 weeks. Advised her to watch her salt. To see im in 6 months     Mertie Moores, MD  06/02/2016 3:30 PM    [place],  [place], [place]  [address] Pager [phone] Phone: [phone]; Fax: [phone]

## 2016-06-02 NOTE — Patient Instructions (Addendum)
Medication Instructions:  STOP Lasix (Furosemide) START HCTZ (Hydrochlorothiazide) 25 mg once daily START Kdur (potassium supplement) 10 meq once daily   Labwork: Your physician recommends that you return for lab work on Friday Oct. 27 at 11:00 am   Testing/Procedures: None Ordered  Follow-Up: Your physician recommends that you return for BP check/nurse visit on Friday Oct. 27 at 11:00 am   Your physician wants you to follow-up in: 6 months with Dr. Acie Fredrickson.  You will receive a reminder letter in the mail two months in advance. If you don't receive a letter, please call our office to schedule the follow-up appointment.   If you need a refill on your cardiac medications before your next appointment, please call your pharmacy.   Thank you for choosing [facility]! Christen Bame, RN [phone]

## 2016-06-06 ENCOUNTER — Ambulatory Visit: Payer: Medicare Other | Admitting: Cardiovascular Disease

## 2016-06-06 NOTE — Addendum Note (Signed)
Addended by: Emmaline Life on: 06/06/2016 11:40 AM   Modules accepted: Orders

## 2016-06-17 DIAGNOSIS — E113293 Type 2 diabetes mellitus with mild nonproliferative diabetic retinopathy without macular edema, bilateral: Secondary | ICD-10-CM | POA: Diagnosis not present

## 2016-06-17 LAB — HM DIABETES EYE EXAM

## 2016-06-23 ENCOUNTER — Telehealth: Payer: Self-pay | Admitting: Cardiovascular Disease

## 2016-06-23 NOTE — Telephone Encounter (Signed)
Attempted to call pt back.  Phone rang several times with no answer and no VM option.  Planned to offer pt to come at the same time on 10/30 or 10/31 for BP check.  Will try again later.

## 2016-06-23 NOTE — Telephone Encounter (Signed)
New message      Pt is calling to resc bp check----anytime next week will be fine.  Nurse room is not open in the computer.  Please resc appt and call pt

## 2016-06-24 ENCOUNTER — Other Ambulatory Visit: Payer: Medicare Other

## 2016-06-24 NOTE — Telephone Encounter (Signed)
I spoke with family member who states patient is at a funeral but would like to come in on Tuesday for BP check.  I advised that patient can come in on Tuesday 10/31 at 2 pm. I am unable to move the appointment due to no schedule for that day. I advised patient can call back to reschedule Monday if this date and time does not work for her.  Family member thanked me for the call.

## 2016-06-24 NOTE — Telephone Encounter (Signed)
Patient having BP check today in the office.

## 2016-06-24 NOTE — Telephone Encounter (Signed)
Have attempted to call patient several times, no answer and no voice mail

## 2016-06-28 ENCOUNTER — Other Ambulatory Visit: Payer: Medicare Other

## 2016-06-28 ENCOUNTER — Ambulatory Visit (INDEPENDENT_AMBULATORY_CARE_PROVIDER_SITE_OTHER): Payer: Medicare Other | Admitting: Nurse Practitioner

## 2016-06-28 VITALS — BP 132/74 | HR 60 | Ht 64.0 in | Wt 187.2 lb

## 2016-06-28 DIAGNOSIS — I10 Essential (primary) hypertension: Secondary | ICD-10-CM | POA: Diagnosis not present

## 2016-06-28 NOTE — Addendum Note (Signed)
Addended by: Eulis Foster on: 06/28/2016 02:46 PM   Modules accepted: Orders

## 2016-06-28 NOTE — Patient Instructions (Signed)
Medication Instructions:  Your physician recommends that you continue on your current medications as directed. Please refer to the Current Medication list given to you today.   Labwork: TODAY - basic metabolic panel   Testing/Procedures: None Ordered   Follow-Up: Your physician wants you to follow-up in: 6 months with Dr. Nahser. You will receive a reminder letter in the mail two months in advance. If you don't receive a letter, please call our office to schedule the follow-up appointment.   If you need a refill on your cardiac medications before your next appointment, please call your pharmacy.   Thank you for choosing [facility]! Michelle Swinyer, RN [phone]    

## 2016-06-28 NOTE — Progress Notes (Signed)
1.) Reason for visit: patient here for BP check since medication changes on 06/02/16  2.) Name of MD requesting visit: Dr. Acie Fredrickson  3.) H&P: patient started on HCTZ 25 mg daily and Kdur 10 meq daily and furosemide stopped on 06/02/16.  4.) ROS related to problem: patient states she is feeling well, no complaints.  States she is urinating less frequently but has more urine output   5.) Assessment and plan per MD: continue current medications and follow-up as scheduled in 6 months

## 2016-06-29 ENCOUNTER — Telehealth: Payer: Self-pay

## 2016-06-29 LAB — BASIC METABOLIC PANEL
BUN: 26 mg/dL — AB (ref 7–25)
CHLORIDE: 110 mmol/L (ref 98–110)
CO2: 23 mmol/L (ref 20–31)
Calcium: 9.9 mg/dL (ref 8.6–10.4)
Creat: 1.24 mg/dL — ABNORMAL HIGH (ref 0.50–0.99)
GLUCOSE: 112 mg/dL — AB (ref 65–99)
POTASSIUM: 4.3 mmol/L (ref 3.5–5.3)
SODIUM: 144 mmol/L (ref 135–146)

## 2016-06-29 NOTE — Telephone Encounter (Signed)
-----   Message from Thayer Headings, MD sent at 06/29/2016 10:54 AM EDT ----- Labs look stable

## 2016-07-04 ENCOUNTER — Ambulatory Visit (INDEPENDENT_AMBULATORY_CARE_PROVIDER_SITE_OTHER): Payer: Medicare Other | Admitting: Physician Assistant

## 2016-07-04 ENCOUNTER — Encounter: Payer: Self-pay | Admitting: Physician Assistant

## 2016-07-04 VITALS — BP 142/70 | HR 55 | Temp 98.0°F | Resp 16 | Wt 190.0 lb

## 2016-07-04 DIAGNOSIS — J441 Chronic obstructive pulmonary disease with (acute) exacerbation: Secondary | ICD-10-CM

## 2016-07-04 DIAGNOSIS — M545 Low back pain, unspecified: Secondary | ICD-10-CM

## 2016-07-04 MED ORDER — TRAMADOL HCL 50 MG PO TABS: 50.0000 mg | ORAL_TABLET | Freq: Three times a day (TID) | ORAL | 0 refills | Status: DC | PRN

## 2016-07-04 MED ORDER — ALBUTEROL SULFATE HFA 108 (90 BASE) MCG/ACT IN AERS: 2.0000 | INHALATION_SPRAY | Freq: Four times a day (QID) | RESPIRATORY_TRACT | 2 refills | Status: DC | PRN

## 2016-07-04 MED ORDER — PREDNISONE 20 MG PO TABS: 20.0000 mg | ORAL_TABLET | Freq: Every day | ORAL | 0 refills | Status: DC

## 2016-07-04 MED ORDER — DOXYCYCLINE HYCLATE 100 MG PO TABS: 100.0000 mg | ORAL_TABLET | Freq: Two times a day (BID) | ORAL | 0 refills | Status: DC

## 2016-07-04 NOTE — Progress Notes (Signed)
Patient ID: Brenda Moreno MRN: [id number], DOB: 1946-03-09, 70 y.o. Date of Encounter: 07/04/2016, 3:04 PM    Chief Complaint:  Chief Complaint  Patient presents with  . Cough     HPI: 70 y.o. year old female presents with above.   Granddaughter is being seen today with similar symptoms. Grand-daughter states that she has been sick with these symptoms for about 2 weeks. Patient states that she has picked up germs from her granddaughter and now has developed the exact same symptoms that the granddaughter has been having. Pt states that she is having nasal congestion and occasionally getting some mucus from her nose. Says she has a lot of cough and chest congestion but cannot get the phlegm to come out. No known fever. No significant amount of sore throat or earache. She was a smoker in the past.  She is also requesting refill on tramadol. Says it's been 1 or 2 years since she was prescribed Tramadol. Says she uses it whenever her low back pain flares up. She is having some mild low back pain because she has been painting her house recently. Having no radiation of the pain down either of the legs. No numbness or tingling down either of the legs. No weakness in either of the legs. Mild discomfort across both sides of her low back.    Home Meds:   Outpatient Medications Prior to Visit  Medication Sig Dispense Refill  . amLODipine (NORVASC) 5 MG tablet TAKE 1 TABLET (5 MG TOTAL) BY MOUTH DAILY. 90 tablet 3  . aspirin EC 81 MG tablet Take 81 mg by mouth daily.      Marland Kitchen CINNAMON PO Take 1,000 mg by mouth 2 (two) times daily.      . clotrimazole-betamethasone (LOTRISONE) cream Apply 1 application topically 2 (two) times daily. 30 g 0  . Coenzyme Q10 (CO Q 10 PO) Take 100 mg by mouth daily.      . empagliflozin (JARDIANCE) 25 MG TABS tablet Take 25 mg by mouth daily. 90 tablet 3  . fluticasone (FLONASE) 50 MCG/ACT nasal spray PLACE 2 SPRAYS INTO BOTH NOSTRILS DAILY. 16 g 11  .  hydrochlorothiazide (HYDRODIURIL) 25 MG tablet Take 1 tablet (25 mg total) by mouth daily. 30 tablet 11  . insulin lispro (HUMALOG) 100 UNIT/ML KiwkPen Inject 0.07 mLs (7 Units total) into the skin daily with supper. 5 pen 3  . Lactobacillus Casei-Folic Acid (RESTORA RX) 60-1.25 MG CAPS Take by mouth.    Marland Kitchen LEVEMIR FLEXTOUCH 100 UNIT/ML Pen Inject subcutaneously 30  units daily at 10:00pm 30 mL 1  . loperamide (IMODIUM A-D) 2 MG tablet Take 2 mg by mouth 4 (four) times daily as needed for diarrhea or loose stools.    . metFORMIN (GLUCOPHAGE) 1000 MG tablet Take 1 tablet by mouth  twice a day with meals 180 tablet 3  . metoprolol succinate (TOPROL-XL) 100 MG 24 hr tablet Take 1 tablet by mouth  daily with or immediatley  following a meal 90 tablet 4  . naproxen (NAPROSYN) 500 MG tablet Take 1 tablet (500 mg total) by mouth 2 (two) times daily. 30 tablet 0  . nitroGLYCERIN (NITROSTAT) 0.4 MG SL tablet Place 1 tablet (0.4 mg total) under the tongue every 5 (five) minutes as needed for chest pain. 90 tablet 2  . pioglitazone (ACTOS) 30 MG tablet Take 1 tablet by mouth  daily 90 tablet 3  . Pitavastatin Calcium (LIVALO) 4 MG TABS Take 1 tablet (4  mg total) by mouth daily. 90 tablet 3  . potassium chloride (K-DUR) 10 MEQ tablet Take 1 tablet (10 mEq total) by mouth daily. 30 tablet 11  . quinapril (ACCUPRIL) 40 MG tablet Take 1 tablet by mouth two  times daily 180 tablet 3  . cyclobenzaprine (FLEXERIL) 10 MG tablet Take 1 tablet (10 mg total) by mouth 3 (three) times daily as needed for muscle spasms. (Patient not taking: Reported on 07/04/2016) 30 tablet 0   No facility-administered medications prior to visit.     Allergies:  Allergies  Allergen Reactions  . Levaquin [Levofloxacin In D5w] Other (See Comments)    Pain all over and in joints  . Statins Other (See Comments)    Severe myalgias to lipitor, crestor, pravastatin and weakness and cramping  in bilateral leg.  [name] [Linagliptin]  Itching      Review of Systems: See HPI for pertinent ROS. All other ROS negative.    Physical Exam: Blood pressure (!) 142/70, pulse (!) 55, temperature 98 F (36.7 C), temperature source Oral, resp. rate 16, weight 190 lb (86.2 kg), SpO2 98 %., Body mass index is 32.61 kg/m. General:  WNWD AAF. Appears in no acute distress. HEENT: Normocephalic, atraumatic, eyes without discharge, sclera non-icteric, nares are without discharge. Bilateral auditory canals clear, TM's are without perforation, pearly grey and translucent with reflective cone of light bilaterally. Oral cavity moist, posterior pharynx without exudate, erythema, peritonsillar abscess.  Neck: Supple. No thyromegaly. No lymphadenopathy. Lungs: She has harsh, coarse wheezes throughout bilaterally.  Oxygen Saturation is 98 % on RA Heart: Regular rhythm. No murmurs, rubs, or gallops. Msk:  Strength and tone normal for age. Extremities/Skin: Warm and dry.  Neuro: Alert and oriented X 3. Moves all extremities spontaneously. Gait is normal. CNII-XII grossly in tact. Psych:  Responds to questions appropriately with a normal affect.     ASSESSMENT AND PLAN:  70 y.o. year old female with  1. COPD exacerbation ([place]) She is to start the doxycycline and prednisone immediately and take as directed. She is to use the albuterol inhaler as needed/as directed. If her breathing worsens despite this treatment, then she is to follow-up immediately either here or the ER depending on the symptoms. If breathing does not return to normal baseline after completion of antibiotic and prednisone, then she is to follow-up as well. - doxycycline (VIBRA-TABS) 100 MG tablet; Take 1 tablet (100 mg total) by mouth 2 (two) times daily.  Dispense: 20 tablet; Refill: 0 - predniSONE (DELTASONE) 20 MG tablet; Take 1 tablet (20 mg total) by mouth daily with breakfast.  Dispense: 5 tablet; Refill: 0 - albuterol (PROVENTIL HFA;VENTOLIN HFA) 108 (90 Base) MCG/ACT  inhaler; Inhale 2 puffs into the lungs every 6 (six) hours as needed for wheezing or shortness of breath.  Dispense: 1 Inhaler; Refill: 2  2. Acute bilateral low back pain without sciatica She can use the tramadol as directed as needed for back pain. As well, she needs to rest the area and can apply heat to the area and gently stretch her low back as well. - traMADol (ULTRAM) 50 MG tablet; Take 1 tablet (50 mg total) by mouth every 8 (eight) hours as needed.  Dispense: 60 tablet; Refill: 0   Signed, [address] [place], [place], [facility] 07/04/2016 3:04 PM

## 2016-07-08 ENCOUNTER — Other Ambulatory Visit: Payer: Self-pay

## 2016-07-08 ENCOUNTER — Encounter: Payer: Self-pay | Admitting: Family Medicine

## 2016-07-08 DIAGNOSIS — M545 Low back pain, unspecified: Secondary | ICD-10-CM

## 2016-07-08 MED ORDER — TRAMADOL HCL 50 MG PO TABS: 50.0000 mg | ORAL_TABLET | Freq: Three times a day (TID) | ORAL | 0 refills | Status: DC | PRN

## 2016-07-08 NOTE — Telephone Encounter (Signed)
Rx was printed on 11-6 pharmacy stated they never rec the Rx   I called Rx in to pharmacy

## 2016-07-28 ENCOUNTER — Ambulatory Visit (INDEPENDENT_AMBULATORY_CARE_PROVIDER_SITE_OTHER): Payer: Medicare Other | Admitting: *Deleted

## 2016-07-28 ENCOUNTER — Other Ambulatory Visit: Payer: Self-pay | Admitting: Family Medicine

## 2016-07-28 DIAGNOSIS — Z23 Encounter for immunization: Secondary | ICD-10-CM | POA: Diagnosis not present

## 2016-07-28 NOTE — Progress Notes (Signed)
Patient ID: Brenda Moreno, female   DOB: 11-21-1945, 70 y.o.   MRN: [id number]  Patient seen in office for Influenza Vaccination.   Tolerated IM administration well.   Immunization history updated.

## 2016-09-19 DIAGNOSIS — E113293 Type 2 diabetes mellitus with mild nonproliferative diabetic retinopathy without macular edema, bilateral: Secondary | ICD-10-CM | POA: Diagnosis not present

## 2016-10-06 ENCOUNTER — Ambulatory Visit (INDEPENDENT_AMBULATORY_CARE_PROVIDER_SITE_OTHER): Payer: Medicare Other | Admitting: Family Medicine

## 2016-10-06 ENCOUNTER — Other Ambulatory Visit: Payer: Self-pay | Admitting: Family Medicine

## 2016-10-06 ENCOUNTER — Encounter: Payer: Self-pay | Admitting: Family Medicine

## 2016-10-06 VITALS — BP 128/70 | HR 68 | Temp 97.6°F | Resp 18 | Ht 64.0 in | Wt 196.0 lb

## 2016-10-06 DIAGNOSIS — Z794 Long term (current) use of insulin: Secondary | ICD-10-CM

## 2016-10-06 DIAGNOSIS — M7989 Other specified soft tissue disorders: Secondary | ICD-10-CM | POA: Diagnosis not present

## 2016-10-06 DIAGNOSIS — N182 Chronic kidney disease, stage 2 (mild): Secondary | ICD-10-CM | POA: Diagnosis not present

## 2016-10-06 DIAGNOSIS — E1122 Type 2 diabetes mellitus with diabetic chronic kidney disease: Secondary | ICD-10-CM

## 2016-10-06 DIAGNOSIS — IMO0002 Reserved for concepts with insufficient information to code with codable children: Secondary | ICD-10-CM

## 2016-10-06 DIAGNOSIS — I10 Essential (primary) hypertension: Secondary | ICD-10-CM

## 2016-10-06 DIAGNOSIS — E1165 Type 2 diabetes mellitus with hyperglycemia: Secondary | ICD-10-CM

## 2016-10-06 MED ORDER — LIRAGLUTIDE 18 MG/3ML ~~LOC~~ SOPN: 1.2000 mg | PEN_INJECTOR | Freq: Every day | SUBCUTANEOUS | 5 refills | Status: DC

## 2016-10-06 NOTE — Progress Notes (Signed)
Subjective:    Patient ID: Brenda Moreno, female    DOB: 1946-05-09, 71 y.o.   MRN: [id number]  HPI Patient has gained 7 pounds since her last office visit in November. She also is complaining of swelling in her feet and her ankles. She is on amlodipine as well as Actos for her other medical problems.  However the patient was on both of these medications when I last saw her in May. At that time her weight was in the 170s. Wt Readings from Last 3 Encounters:  10/06/16 196 lb (88.9 kg)  07/04/16 190 lb (86.2 kg)  06/28/16 187 lb 3.2 oz (84.9 kg)  She was 179 in May.  Swelling also seemed to coincide with the discontinuation of furosemide and start hydrochlorothiazide. Now her blood pressure is excellent. However the swelling has worsened. She denies any chest pain shortness of breath or dyspnea on exertion. She denies any orthopnea. She is not exercising. She reports her fasting blood sugars are less than 100. Her two-hour postprandial sugars are approximately 200. This is been the best control of her sugars a been in quite some time.  Past Medical History:  Diagnosis Date  . Arthritis   . CAD (coronary artery disease)    S/P cabg  . Carotid artery occlusion   . CHF (congestive heart failure) ([place])   . DDD (degenerative disc disease), lumbar   . Diabetes mellitus   . Hyperlipidemia   . Hypertension   . Joint pain   . Leg pain   . Myocardial infarction   . Peripheral vascular disease ([place])   . PVD (peripheral vascular disease) ([place])    Past Surgical History:  Procedure Laterality Date  . APPENDECTOMY    . CHOLECYSTECTOMY     Gall bladder  . CORONARY ARTERY BYPASS GRAFT     1998  . PR VEIN BYPASS GRAFT,AORTO-FEM-POP    . TUBAL LIGATION     Current Outpatient Prescriptions on File Prior to Visit  Medication Sig Dispense Refill  . albuterol (PROVENTIL HFA;VENTOLIN HFA) 108 (90 Base) MCG/ACT inhaler Inhale 2 puffs into the lungs every 6 (six) hours as needed for wheezing or  shortness of breath. 1 Inhaler 2  . amLODipine (NORVASC) 5 MG tablet TAKE 1 TABLET (5 MG TOTAL) BY MOUTH DAILY. 90 tablet 3  . aspirin EC 81 MG tablet Take 81 mg by mouth daily.      Marland Kitchen CINNAMON PO Take 1,000 mg by mouth 2 (two) times daily.      . clotrimazole-betamethasone (LOTRISONE) cream Apply 1 application topically 2 (two) times daily. 30 g 0  . Coenzyme Q10 (CO Q 10 PO) Take 100 mg by mouth daily.      Marland Kitchen doxycycline (VIBRA-TABS) 100 MG tablet Take 1 tablet (100 mg total) by mouth 2 (two) times daily. 20 tablet 0  . empagliflozin (JARDIANCE) 25 MG TABS tablet Take 25 mg by mouth daily. 90 tablet 3  . fluticasone (FLONASE) 50 MCG/ACT nasal spray PLACE 2 SPRAYS INTO BOTH NOSTRILS DAILY. 16 g 11  . insulin lispro (HUMALOG) 100 UNIT/ML KiwkPen Inject 0.07 mLs (7 Units total) into the skin daily with supper. 5 pen 3  . Lactobacillus Casei-Folic Acid (RESTORA RX) 60-1.25 MG CAPS Take by mouth.    Marland Kitchen LEVEMIR FLEXTOUCH 100 UNIT/ML Pen INJECT SUBCUTANEOUSLY 30  UNITS DAILY AT 10:00PM 30 mL 1  . loperamide (IMODIUM A-D) 2 MG tablet Take 2 mg by mouth 4 (four) times daily as needed for diarrhea  or loose stools.    . metFORMIN (GLUCOPHAGE) 1000 MG tablet Take 1 tablet by mouth  twice a day with meals 180 tablet 3  . metoprolol succinate (TOPROL-XL) 100 MG 24 hr tablet Take 1 tablet by mouth  daily with or immediatley  following a meal 90 tablet 4  . naproxen (NAPROSYN) 500 MG tablet Take 1 tablet (500 mg total) by mouth 2 (two) times daily. 30 tablet 0  . nitroGLYCERIN (NITROSTAT) 0.4 MG SL tablet Place 1 tablet (0.4 mg total) under the tongue every 5 (five) minutes as needed for chest pain. 90 tablet 2  . pioglitazone (ACTOS) 30 MG tablet Take 1 tablet by mouth  daily 90 tablet 3  . Pitavastatin Calcium (LIVALO) 4 MG TABS Take 1 tablet (4 mg total) by mouth daily. 90 tablet 3  . quinapril (ACCUPRIL) 40 MG tablet Take 1 tablet by mouth two  times daily 180 tablet 3  . traMADol (ULTRAM) 50 MG tablet Take  1 tablet (50 mg total) by mouth every 8 (eight) hours as needed. 60 tablet 0  . cyclobenzaprine (FLEXERIL) 10 MG tablet Take 1 tablet (10 mg total) by mouth 3 (three) times daily as needed for muscle spasms. (Patient not taking: Reported on 07/04/2016) 30 tablet 0  . hydrochlorothiazide (HYDRODIURIL) 25 MG tablet Take 1 tablet (25 mg total) by mouth daily. 30 tablet 11  . potassium chloride (K-DUR) 10 MEQ tablet Take 1 tablet (10 mEq total) by mouth daily. 30 tablet 11   No current facility-administered medications on file prior to visit.    Allergies  Allergen Reactions  . Levaquin [Levofloxacin In D5w] Other (See Comments)    Pain all over and in joints  . Statins Other (See Comments)    Severe myalgias to lipitor, crestor, pravastatin and weakness and cramping  in bilateral leg.  [name] [Linagliptin] Itching   Social History   Social History  . Marital status: Divorced    Spouse name: N/A  . Number of children: N/A  . Years of education: N/A   Occupational History  . Not on file.   Social History Main Topics  . Smoking status: Former Smoker    Packs/day: 0.25    Types: Cigarettes    Quit date: 09/13/2011  . Smokeless tobacco: Never Used  . Alcohol use No  . Drug use: No  . Sexual activity: Not on file   Other Topics Concern  . Not on file   Social History Narrative  . No narrative on file      Review of Systems  All other systems reviewed and are negative.      Objective:   Physical Exam  Constitutional: She appears well-developed and well-nourished.  Cardiovascular: Normal rate, regular rhythm and normal heart sounds.   Pulmonary/Chest: Effort normal and breath sounds normal. No respiratory distress. She has no wheezes. She has no rales. She exhibits no tenderness.  Abdominal: Soft. Bowel sounds are normal. She exhibits no distension. There is no tenderness. There is no rebound and no guarding.  Musculoskeletal: She exhibits edema.  Vitals  reviewed.         Assessment & Plan:  Leg swelling, insulin-dependent diabetes mellitus type 2, essential hypertension  I believe her problem is multifactorial due to a combination of medication such as amlodipine and Actos, plus the switch from furosemide to hydrochlorothiazide. We discussed this in detail. Patient would like to try switching from Actos to the toes of 1.2 mg daily. We will make  the switch in her medications and then recheck the swelling in 2 weeks. I see no indication for congestive heart failure. Discontinue Actos and begin the toes are 1.2 mg daily

## 2016-10-07 ENCOUNTER — Telehealth: Payer: Self-pay | Admitting: Family Medicine

## 2016-10-07 NOTE — Telephone Encounter (Signed)
Victoza is $300 and states that you had mentioned another medication that may be cheaper.

## 2016-10-07 NOTE — Telephone Encounter (Signed)
Other option is to stay on Actos, put up with the weight gain, discontinue hydrochlorothiazide, and replace with Lasix 40 mg a day for the swelling

## 2016-10-10 NOTE — Telephone Encounter (Signed)
LMTRC

## 2016-10-13 ENCOUNTER — Ambulatory Visit: Payer: Medicare Other | Admitting: Family

## 2016-10-13 ENCOUNTER — Encounter: Payer: Self-pay | Admitting: Family Medicine

## 2016-10-13 ENCOUNTER — Ambulatory Visit (HOSPITAL_COMMUNITY): Payer: Medicare Other

## 2016-10-13 MED ORDER — FUROSEMIDE 40 MG PO TABS: 40.0000 mg | ORAL_TABLET | Freq: Every day | ORAL | 3 refills | Status: DC

## 2016-10-13 NOTE — Telephone Encounter (Signed)
This encounter was created in error - please disregard.

## 2016-10-13 NOTE — Telephone Encounter (Signed)
Patient is calling regarding her victoza being too expensive please call her at [phone]

## 2016-10-13 NOTE — Telephone Encounter (Signed)
Patient aware of providers recommendations. Med sen to pharm.

## 2016-10-13 NOTE — Addendum Note (Signed)
Addended by: Shary Decamp B on: 10/13/2016 04:21 PM   Modules accepted: Orders

## 2016-10-14 ENCOUNTER — Other Ambulatory Visit: Payer: Self-pay | Admitting: Family Medicine

## 2016-10-14 DIAGNOSIS — Z1231 Encounter for screening mammogram for malignant neoplasm of breast: Secondary | ICD-10-CM

## 2016-10-20 ENCOUNTER — Encounter (HOSPITAL_COMMUNITY): Payer: Self-pay

## 2016-10-20 ENCOUNTER — Emergency Department (HOSPITAL_COMMUNITY)
Admission: EM | Admit: 2016-10-20 | Discharge: 2016-10-20 | Disposition: A | Discharge status: Home / Self Care | Payer: Medicare Other | Attending: Emergency Medicine | Admitting: Emergency Medicine

## 2016-10-20 ENCOUNTER — Emergency Department (HOSPITAL_COMMUNITY): Payer: Medicare Other

## 2016-10-20 DIAGNOSIS — S161XXA Strain of muscle, fascia and tendon at neck level, initial encounter: Secondary | ICD-10-CM | POA: Diagnosis not present

## 2016-10-20 DIAGNOSIS — Z79899 Other long term (current) drug therapy: Secondary | ICD-10-CM | POA: Diagnosis not present

## 2016-10-20 DIAGNOSIS — Y92410 Unspecified street and highway as the place of occurrence of the external cause: Secondary | ICD-10-CM | POA: Diagnosis not present

## 2016-10-20 DIAGNOSIS — I110 Hypertensive heart disease with heart failure: Secondary | ICD-10-CM | POA: Insufficient documentation

## 2016-10-20 DIAGNOSIS — M542 Cervicalgia: Secondary | ICD-10-CM | POA: Diagnosis not present

## 2016-10-20 DIAGNOSIS — Y999 Unspecified external cause status: Secondary | ICD-10-CM | POA: Insufficient documentation

## 2016-10-20 DIAGNOSIS — I2510 Atherosclerotic heart disease of native coronary artery without angina pectoris: Secondary | ICD-10-CM | POA: Insufficient documentation

## 2016-10-20 DIAGNOSIS — I509 Heart failure, unspecified: Secondary | ICD-10-CM | POA: Insufficient documentation

## 2016-10-20 DIAGNOSIS — E119 Type 2 diabetes mellitus without complications: Secondary | ICD-10-CM | POA: Diagnosis not present

## 2016-10-20 DIAGNOSIS — Y9389 Activity, other specified: Secondary | ICD-10-CM | POA: Diagnosis not present

## 2016-10-20 DIAGNOSIS — S20212A Contusion of left front wall of thorax, initial encounter: Secondary | ICD-10-CM

## 2016-10-20 DIAGNOSIS — S299XXA Unspecified injury of thorax, initial encounter: Secondary | ICD-10-CM | POA: Diagnosis not present

## 2016-10-20 DIAGNOSIS — M546 Pain in thoracic spine: Secondary | ICD-10-CM | POA: Diagnosis not present

## 2016-10-20 DIAGNOSIS — Z7982 Long term (current) use of aspirin: Secondary | ICD-10-CM | POA: Insufficient documentation

## 2016-10-20 DIAGNOSIS — T148XXA Other injury of unspecified body region, initial encounter: Secondary | ICD-10-CM | POA: Diagnosis not present

## 2016-10-20 DIAGNOSIS — Z87891 Personal history of nicotine dependence: Secondary | ICD-10-CM | POA: Diagnosis not present

## 2016-10-20 DIAGNOSIS — Z794 Long term (current) use of insulin: Secondary | ICD-10-CM | POA: Diagnosis not present

## 2016-10-20 DIAGNOSIS — S199XXA Unspecified injury of neck, initial encounter: Secondary | ICD-10-CM | POA: Diagnosis present

## 2016-10-20 MED ORDER — HYDROCODONE-ACETAMINOPHEN 5-325 MG PO TABS: 1.0000 | ORAL_TABLET | Freq: Four times a day (QID) | ORAL | 0 refills | Status: DC | PRN

## 2016-10-20 MED ORDER — HYDROCODONE-ACETAMINOPHEN 5-325 MG PO TABS
2.0000 | ORAL_TABLET | Freq: Once | ORAL | Status: AC
  Administered 2016-10-20: 2 via ORAL
  Filled 2016-10-20: qty 2
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
## 2016-10-20 NOTE — Discharge Instructions (Signed)
Hydrocodone as prescribed as needed for pain.  Return to the emergency department for worsening pain, difficulty breathing, fevers, or other new and concerning symptoms.

## 2016-10-24 ENCOUNTER — Ambulatory Visit (INDEPENDENT_AMBULATORY_CARE_PROVIDER_SITE_OTHER): Payer: Medicare Other | Admitting: Physician Assistant

## 2016-10-24 ENCOUNTER — Encounter: Payer: Self-pay | Admitting: Physician Assistant

## 2016-10-24 VITALS — BP 150/82 | HR 56 | Temp 98.0°F | Resp 17 | Wt 191.2 lb

## 2016-10-24 DIAGNOSIS — M25512 Pain in left shoulder: Secondary | ICD-10-CM

## 2016-10-24 MED ORDER — CYCLOBENZAPRINE HCL 10 MG PO TABS: 10.0000 mg | ORAL_TABLET | Freq: Three times a day (TID) | ORAL | 0 refills | Status: DC | PRN

## 2016-10-24 MED ORDER — HYDROCODONE-ACETAMINOPHEN 5-325 MG PO TABS: 1.0000 | ORAL_TABLET | Freq: Four times a day (QID) | ORAL | 0 refills | Status: DC | PRN
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
## 2016-11-09 ENCOUNTER — Ambulatory Visit
Admission: RE | Admit: 2016-11-09 | Discharge: 2016-11-09 | Disposition: A | Discharge status: Home / Self Care | Payer: Medicare Other | Source: Ambulatory Visit | Attending: Family Medicine | Admitting: Family Medicine

## 2016-11-09 DIAGNOSIS — Z1231 Encounter for screening mammogram for malignant neoplasm of breast: Secondary | ICD-10-CM

## 2016-11-10 ENCOUNTER — Other Ambulatory Visit: Payer: Self-pay | Admitting: Family Medicine

## 2016-11-10 ENCOUNTER — Encounter: Payer: Self-pay | Admitting: Physician Assistant

## 2016-11-10 ENCOUNTER — Ambulatory Visit: Payer: Medicare Other | Admitting: Physician Assistant

## 2016-11-10 ENCOUNTER — Ambulatory Visit (INDEPENDENT_AMBULATORY_CARE_PROVIDER_SITE_OTHER): Payer: Medicare Other | Admitting: Physician Assistant

## 2016-11-10 VITALS — BP 136/82 | HR 95 | Temp 98.2°F | Resp 16 | Wt 187.2 lb

## 2016-11-10 DIAGNOSIS — J988 Other specified respiratory disorders: Secondary | ICD-10-CM

## 2016-11-10 DIAGNOSIS — B9689 Other specified bacterial agents as the cause of diseases classified elsewhere: Principal | ICD-10-CM

## 2016-11-10 DIAGNOSIS — R928 Other abnormal and inconclusive findings on diagnostic imaging of breast: Secondary | ICD-10-CM

## 2016-11-10 MED ORDER — AZITHROMYCIN 250 MG PO TABS: ORAL_TABLET | ORAL | 0 refills | Status: DC

## 2016-11-10 MED ORDER — CETIRIZINE HCL 10 MG PO TABS: 10.0000 mg | ORAL_TABLET | Freq: Every day | ORAL | 11 refills | Status: DC

## 2016-11-10 NOTE — Progress Notes (Signed)
Patient ID: JERLINE LINZY MRN: [id number], DOB: January 29, 1946, 71 y.o. Date of Encounter: 11/10/2016, 4:22 PM    Chief Complaint:  Cough    HPI: 71 y.o. year old female states that she has had this cough even back at the time of her last visit which was 10/24/16. She still has this cough. Has drainage and phlegm in her throat but cannot get it out.  Does not feel like she has any phlegm or congestion down in her chest-- just in her throat. After further questioning she does report that she also does have some episodes of sneezing runny nose and at other times nasal congestion. Says that she has been using an over-the-counter offbrand allergy medicine. Then says that the next day she has the same cycle all over again and this annoying drainage in her throat.     Home Meds:   Outpatient Medications Prior to Visit  Medication Sig Dispense Refill  . albuterol (PROVENTIL HFA;VENTOLIN HFA) 108 (90 Base) MCG/ACT inhaler Inhale 2 puffs into the lungs every 6 (six) hours as needed for wheezing or shortness of breath. 1 Inhaler 2  . amLODipine (NORVASC) 5 MG tablet TAKE 1 TABLET (5 MG TOTAL) BY MOUTH DAILY. 90 tablet 3  . aspirin EC 81 MG tablet Take 81 mg by mouth daily.      Marland Kitchen aspirin-sod bicarb-citric acid (ALKA-SELTZER) 325 MG TBEF tablet Take 325 mg by mouth every 6 (six) hours as needed.    Marland Kitchen CINNAMON PO Take 1,000 mg by mouth 2 (two) times daily.      . clotrimazole-betamethasone (LOTRISONE) cream Apply 1 application topically 2 (two) times daily. 30 g 0  . Coenzyme Q10 (CO Q 10 PO) Take 100 mg by mouth daily.      . cyclobenzaprine (FLEXERIL) 10 MG tablet Take 1 tablet (10 mg total) by mouth 3 (three) times daily as needed for muscle spasms. 30 tablet 0  . dextromethorphan-guaiFENesin (MUCINEX DM) 30-600 MG 12hr tablet Take 1 tablet by mouth 2 (two) times daily.    Marland Kitchen doxycycline (VIBRA-TABS) 100 MG tablet Take 1 tablet (100 mg total) by mouth 2 (two) times daily. 20 tablet 0  .  empagliflozin (JARDIANCE) 25 MG TABS tablet Take 25 mg by mouth daily. 90 tablet 3  . fluticasone (FLONASE) 50 MCG/ACT nasal spray PLACE 2 SPRAYS INTO BOTH NOSTRILS DAILY. 16 g 11  . furosemide (LASIX) 40 MG tablet Take 1 tablet (40 mg total) by mouth daily. 90 tablet 3  . hydrochlorothiazide (HYDRODIURIL) 25 MG tablet Take 1 tablet by mouth daily.    Marland Kitchen HYDROcodone-acetaminophen (NORCO) 5-325 MG tablet Take 1-2 tablets by mouth every 6 (six) hours as needed. 15 tablet 0  . insulin lispro (HUMALOG) 100 UNIT/ML KiwkPen Inject 0.07 mLs (7 Units total) into the skin daily with supper. 5 pen 3  . KLOR-CON 10 10 MEQ tablet Take 10 mEq by mouth every 6 (six) hours.    Marland Kitchen LEVEMIR FLEXTOUCH 100 UNIT/ML Pen INJECT SUBCUTANEOUSLY 30  UNITS DAILY AT 10:00PM 30 mL 1  . liraglutide (VICTOZA) 18 MG/3ML SOPN Inject 0.2 mLs (1.2 mg total) into the skin daily. 3 pen 5  . loperamide (IMODIUM A-D) 2 MG tablet Take 2 mg by mouth 4 (four) times daily as needed for diarrhea or loose stools.    . metFORMIN (GLUCOPHAGE) 1000 MG tablet Take 1 tablet by mouth  twice a day with meals 180 tablet 3  . metoprolol succinate (TOPROL-XL) 100 MG 24 hr  tablet Take 1 tablet by mouth  daily with or immediatley  following a meal 90 tablet 4  . nitroGLYCERIN (NITROSTAT) 0.4 MG SL tablet Place 1 tablet (0.4 mg total) under the tongue every 5 (five) minutes as needed for chest pain. 90 tablet 2  . pioglitazone (ACTOS) 30 MG tablet Take 1 tablet by mouth daily.    . Pitavastatin Calcium (LIVALO) 4 MG TABS Take 1 tablet (4 mg total) by mouth daily. 90 tablet 3  . quinapril (ACCUPRIL) 40 MG tablet Take 1 tablet by mouth two  times daily 180 tablet 3  . traMADol (ULTRAM) 50 MG tablet Take 1 tablet (50 mg total) by mouth every 8 (eight) hours as needed. 60 tablet 0  . potassium chloride (K-DUR) 10 MEQ tablet Take 1 tablet (10 mEq total) by mouth daily. 30 tablet 11   No facility-administered medications prior to visit.     Allergies:    Allergies  Allergen Reactions  . Levaquin [Levofloxacin In D5w] Other (See Comments)    Pain all over and in joints  . Statins Other (See Comments)    Severe myalgias to lipitor, crestor, pravastatin and weakness and cramping  in bilateral leg.  [name] [Linagliptin] Itching      Review of Systems: See HPI for pertinent ROS. All other ROS negative.    Physical Exam: Blood pressure 136/82, pulse 95, temperature 98.2 F (36.8 C), temperature source Oral, resp. rate 16, weight 187 lb 3.2 oz (84.9 kg), SpO2 98 %., Body mass index is 30.21 kg/m. General: AAF.  Appears in no acute distress. HEENT: Normocephalic, atraumatic, eyes without discharge, sclera non-icteric, nares are without discharge. Bilateral auditory canals clear, TM's are without perforation, pearly grey and translucent with reflective cone of light bilaterally. Oral cavity moist, posterior pharynx without exudate, erythema. No tenderness with percussion to frontal or maxillary sinuses bilaterally.  Neck: Supple. No thyromegaly. No lymphadenopathy. Lungs: Clear bilaterally to auscultation without wheezes, rales, or rhonchi. Breathing is unlabored. Heart: Regular rhythm. No murmurs, rubs, or gallops. Msk:  Strength and tone normal for age. Extremities/Skin: Warm and dry. Neuro: Alert and oriented X 3. Moves all extremities spontaneously. Gait is normal. CNII-XII grossly in tact. Psych:  Responds to questions appropriately with a normal affect.     ASSESSMENT AND PLAN:  71 y.o. year old female with  1. Bacterial respiratory infection She is to take antibioitc as directed to kill off any bacterial infection present. Will also take the Zyrtec to help dry up any drainage. F/U if symptoms do not resolve with this. - azithromycin (ZITHROMAX) 250 MG tablet; Day 1: Take 2 daily. Days 2 -5: Take 1 daily.  Dispense: 6 tablet; Refill: 0 - cetirizine (ZYRTEC) 10 MG tablet; Take 1 tablet (10 mg total) by mouth daily.  Dispense: 30  tablet; Refill: [address] [place], [place], [facility] 11/10/2016 4:22 PM

## 2016-11-11 ENCOUNTER — Encounter: Payer: Self-pay | Admitting: Family Medicine

## 2016-11-11 ENCOUNTER — Ambulatory Visit (INDEPENDENT_AMBULATORY_CARE_PROVIDER_SITE_OTHER): Payer: Medicare Other | Admitting: Family Medicine

## 2016-11-11 VITALS — BP 128/80 | HR 78 | Temp 98.2°F | Resp 16 | Ht 64.0 in | Wt 188.0 lb

## 2016-11-11 DIAGNOSIS — R0781 Pleurodynia: Secondary | ICD-10-CM | POA: Diagnosis not present
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
## 2016-11-18 ENCOUNTER — Encounter: Payer: Self-pay | Admitting: Family

## 2016-11-23 ENCOUNTER — Ambulatory Visit
Admission: RE | Admit: 2016-11-23 | Discharge: 2016-11-23 | Disposition: A | Discharge status: Home / Self Care | Payer: Medicare Other | Source: Ambulatory Visit | Attending: Family Medicine | Admitting: Family Medicine

## 2016-11-23 DIAGNOSIS — R928 Other abnormal and inconclusive findings on diagnostic imaging of breast: Secondary | ICD-10-CM | POA: Diagnosis not present

## 2016-11-30 ENCOUNTER — Encounter: Payer: Self-pay | Admitting: Family

## 2016-11-30 ENCOUNTER — Ambulatory Visit (INDEPENDENT_AMBULATORY_CARE_PROVIDER_SITE_OTHER)
Admission: RE | Admit: 2016-11-30 | Discharge: 2016-11-30 | Disposition: A | Discharge status: Home / Self Care | Payer: Medicare Other | Source: Ambulatory Visit | Attending: Family | Admitting: Family

## 2016-11-30 ENCOUNTER — Ambulatory Visit (HOSPITAL_COMMUNITY)
Admission: RE | Admit: 2016-11-30 | Discharge: 2016-11-30 | Disposition: A | Discharge status: Home / Self Care | Payer: Medicare Other | Source: Ambulatory Visit | Attending: Family | Admitting: Family

## 2016-11-30 ENCOUNTER — Ambulatory Visit (INDEPENDENT_AMBULATORY_CARE_PROVIDER_SITE_OTHER): Payer: Medicare Other | Admitting: Family

## 2016-11-30 VITALS — BP 140/82 | HR 78 | Temp 97.0°F | Resp 18 | Ht 66.0 in | Wt 184.0 lb

## 2016-11-30 DIAGNOSIS — Z4889 Encounter for other specified surgical aftercare: Secondary | ICD-10-CM | POA: Insufficient documentation

## 2016-11-30 DIAGNOSIS — I779 Disorder of arteries and arterioles, unspecified: Secondary | ICD-10-CM | POA: Insufficient documentation

## 2016-11-30 DIAGNOSIS — F17200 Nicotine dependence, unspecified, uncomplicated: Secondary | ICD-10-CM

## 2016-11-30 DIAGNOSIS — Z95828 Presence of other vascular implants and grafts: Secondary | ICD-10-CM | POA: Insufficient documentation

## 2016-11-30 DIAGNOSIS — E1151 Type 2 diabetes mellitus with diabetic peripheral angiopathy without gangrene: Secondary | ICD-10-CM | POA: Diagnosis not present

## 2016-11-30 DIAGNOSIS — Z48812 Encounter for surgical aftercare following surgery on the circulatory system: Secondary | ICD-10-CM

## 2016-11-30 DIAGNOSIS — Z87891 Personal history of nicotine dependence: Secondary | ICD-10-CM | POA: Diagnosis not present

## 2016-11-30 DIAGNOSIS — Z7722 Contact with and (suspected) exposure to environmental tobacco smoke (acute) (chronic): Secondary | ICD-10-CM | POA: Diagnosis not present

## 2016-11-30 NOTE — Patient Instructions (Addendum)
Peripheral Vascular Disease Peripheral vascular disease (PVD) is a disease of the blood vessels that are not part of your heart and brain. A simple term for PVD is poor circulation. In most cases, PVD narrows the blood vessels that carry blood from your heart to the rest of your body. This can result in a decreased supply of blood to your arms, legs, and internal organs, like your stomach or kidneys. However, it most often affects a person's lower legs and feet. There are two types of PVD.  Organic PVD. This is the more common type. It is caused by damage to the structure of blood vessels.  Functional PVD. This is caused by conditions that make blood vessels contract and tighten (spasm).  Without treatment, PVD tends to get worse over time. PVD can also lead to acute ischemic limb. This is when an arm or limb suddenly has trouble getting enough blood. This is a medical emergency. Follow these instructions at home:  Take medicines only as told by your doctor.  Do not use any tobacco products, including cigarettes, chewing tobacco, or electronic cigarettes. If you need help quitting, ask your doctor.  Lose weight if you are overweight, and maintain a healthy weight as told by your doctor.  Eat a diet that is low in fat and cholesterol. If you need help, ask your doctor.  Exercise regularly. Ask your doctor for some good activities for you.  Take good care of your feet. ? Wear comfortable shoes that fit well. ? Check your feet often for any cuts or sores. Contact a doctor if:  You have cramps in your legs while walking.  You have leg pain when you are at rest.  You have coldness in a leg or foot.  Your skin changes.  You are unable to get or have an erection (erectile dysfunction).  You have cuts or sores on your feet that are not healing. Get help right away if:  Your arm or leg turns cold and blue.  Your arms or legs become red, warm, swollen, painful, or numb.  You have  chest pain or trouble breathing.  You suddenly have weakness in your face, arm, or leg.  You become very confused or you cannot speak.  You suddenly have a very bad headache.  You suddenly cannot see. This information is not intended to replace advice given to you by your health care provider. Make sure you discuss any questions you have with your health care provider. Document Released: 11/09/2009 Document Revised: 01/21/2016 Document Reviewed: 01/23/2014 Elsevier Interactive Patient Education  2017 [employer]     Secondhand Smoke What is secondhand smoke? Secondhand smoke is smoke that comes from burning tobacco. It could be the smoke from a cigarette, a pipe, or a cigar. Even if you are not the one smoking, secondhand smoke exposes you to the dangers of smoking. This is called involuntary, or passive, smoking. There are two types of secondhand smoke:  Sidestream smoke is the smoke that comes off the lighted end of a cigarette, pipe, or cigar. ? This type of smoke has the highest amount of cancer-causing agents (carcinogens). ? The particles in sidestream smoke are smaller. They get into your lungs more easily.  Mainstream smoke is the smoke that is exhaled by a person who is smoking. ? This type of smoke is also dangerous to your health.  How can secondhand smoke affect my health? Studies show that there is no safe level of secondhand smoke. This smoke contains   thousands of chemicals. At least 69 of them are known to cause cancer. Secondhand smoke can also cause many other health problems. It has been linked to:  Lung cancer.  Cancer of the voice box (larynx) or throat.  Cancer of the sinuses.  Brain cancer.  Bladder cancer.  Stomach cancer.  Breast cancer.  White blood cell cancers (lymphoma and leukemia).  Brain and liver tumors in children.  Heart disease and stroke in adults.  Pregnancy loss (miscarriage).  Diseases in children, such  as: ? Asthma. ? Lung infections. ? Ear infections. ? Sudden infant death syndrome (SIDS). ? Slow growth.  Where can I be at risk for exposure to secondhand smoke?  For adults, the workplace is the main source of exposure to secondhand smoke. ? Your workplace should have a policy separating smoking areas from nonsmoking areas. ? Smoking areas should have a system for ventilating and cleaning the air.  For children, the home may be the most dangerous place for exposure to secondhand smoke. ? Children who live in apartment buildings may be at risk from smoke drifting from hallways or other people's homes.  For everyone, many public places are possible sources of exposure to secondhand smoke. ? These places include restaurants, shopping centers, and parks. How can I reduce my risk for exposure to secondhand smoke? The most important thing you can do is not smoke. Discourage family members from smoking. Other ways to reduce exposure for you and your family include the following:  Keep your home smoke free.  Make sure your child care providers do not smoke.  Warn your child about the dangers of smoking and secondhand smoke.  Do not allow smoking in your car. When someone smokes in a car, all the damaging chemicals from the smoke are confined in a small area.  Avoid public places where smoking is allowed.  This information is not intended to replace advice given to you by your health care provider. Make sure you discuss any questions you have with your health care provider. Document Released: 09/22/2004 Document Revised: 07/12/2016 Document Reviewed: 11/29/2013 Elsevier Interactive Patient Education  2017 [employer].  
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
## 2016-12-01 ENCOUNTER — Other Ambulatory Visit: Payer: Self-pay | Admitting: Family Medicine

## 2016-12-01 MED ORDER — INSULIN DETEMIR 100 UNIT/ML FLEXPEN: 30.0000 [IU] | PEN_INJECTOR | Freq: Every day | SUBCUTANEOUS | 1 refills | Status: DC

## 2016-12-01 MED ORDER — INSULIN LISPRO 100 UNIT/ML (KWIKPEN): 7.0000 [IU] | PEN_INJECTOR | Freq: Every day | SUBCUTANEOUS | 3 refills | Status: DC

## 2016-12-01 MED ORDER — EMPAGLIFLOZIN 25 MG PO TABS: 25.0000 mg | ORAL_TABLET | Freq: Every day | ORAL | 3 refills | Status: DC

## 2016-12-01 MED ORDER — LIRAGLUTIDE 18 MG/3ML ~~LOC~~ SOPN: 1.2000 mg | PEN_INJECTOR | Freq: Every day | SUBCUTANEOUS | 5 refills | Status: DC

## 2016-12-01 MED ORDER — PITAVASTATIN CALCIUM 4 MG PO TABS: 1.0000 | ORAL_TABLET | Freq: Every day | ORAL | 3 refills | Status: DC

## 2016-12-01 NOTE — Addendum Note (Signed)
Addended by: Lianne Cure A on: 12/01/2016 09:47 AM   Modules accepted: Orders

## 2016-12-05 ENCOUNTER — Telehealth: Payer: Self-pay | Admitting: Family Medicine

## 2016-12-05 NOTE — Telephone Encounter (Signed)
Patient has lots of questions about meds including getting samples  Please call her at [phone]

## 2016-12-05 NOTE — Telephone Encounter (Signed)
Samples Livalo, Humalog given

## 2016-12-26 ENCOUNTER — Encounter: Payer: Self-pay | Admitting: Family Medicine

## 2016-12-26 ENCOUNTER — Ambulatory Visit (INDEPENDENT_AMBULATORY_CARE_PROVIDER_SITE_OTHER): Payer: Medicare Other | Admitting: Family Medicine

## 2016-12-26 VITALS — BP 104/60 | HR 76 | Temp 98.1°F | Resp 14 | Ht 64.0 in | Wt 186.0 lb

## 2016-12-26 DIAGNOSIS — E1165 Type 2 diabetes mellitus with hyperglycemia: Secondary | ICD-10-CM | POA: Diagnosis not present

## 2016-12-26 DIAGNOSIS — N182 Chronic kidney disease, stage 2 (mild): Secondary | ICD-10-CM | POA: Diagnosis not present

## 2016-12-26 DIAGNOSIS — E1122 Type 2 diabetes mellitus with diabetic chronic kidney disease: Secondary | ICD-10-CM

## 2016-12-26 DIAGNOSIS — Z794 Long term (current) use of insulin: Secondary | ICD-10-CM

## 2016-12-26 DIAGNOSIS — IMO0002 Reserved for concepts with insufficient information to code with codable children: Secondary | ICD-10-CM

## 2016-12-26 MED ORDER — INSULIN NPH ISOPHANE & REGULAR (70-30) 100 UNIT/ML ~~LOC~~ SUSP: SUBCUTANEOUS | 11 refills | Status: DC

## 2016-12-26 MED ORDER — PIOGLITAZONE HCL 30 MG PO TABS: 30.0000 mg | ORAL_TABLET | Freq: Every day | ORAL | 5 refills | Status: DC

## 2016-12-26 MED ORDER — METFORMIN HCL 1000 MG PO TABS: 1000.0000 mg | ORAL_TABLET | Freq: Two times a day (BID) | ORAL | 3 refills | Status: DC

## 2016-12-26 NOTE — Progress Notes (Signed)
Subjective:    Patient ID: Brenda Moreno, female    DOB: 08/19/1946, 71 y.o.   MRN: [id number]  HPI Due to leg swelling, I recommended the patient stop actos in February and replace with Victoza 1.2 mg sq qd.  However recently, the patient lost all for financial assistance. As a result she discontinued levemir, jardiance, livalo.  She is taking metformin every day. She is taking Actos hit and miss. She is taking Humalog 10 units 3 times a day. Minutes. Her sugars are out of control. Her most recent hemoglobin A1c checked by home health nurse was greater than 9. She reports fasting blood sugars around 200 and postprandial sugars around 300    Past Medical History:  Diagnosis Date  . Arthritis   . CAD (coronary artery disease)    S/P cabg  . Carotid artery occlusion   . CHF (congestive heart failure) ([place])   . DDD (degenerative disc disease), lumbar   . Diabetes mellitus   . Hyperlipidemia   . Hypertension   . Joint pain   . Leg pain   . Myocardial infarction ([place])   . Peripheral vascular disease ([place])   . PVD (peripheral vascular disease) ([place])    Past Surgical History:  Procedure Laterality Date  . APPENDECTOMY    . BREAST EXCISIONAL BIOPSY Left 2008  . CHOLECYSTECTOMY     Gall bladder  . CORONARY ARTERY BYPASS GRAFT     1998  . PR VEIN BYPASS GRAFT,AORTO-FEM-POP    . TUBAL LIGATION     Current Outpatient Prescriptions on File Prior to Visit  Medication Sig Dispense Refill  . albuterol (PROVENTIL HFA;VENTOLIN HFA) 108 (90 Base) MCG/ACT inhaler Inhale 2 puffs into the lungs every 6 (six) hours as needed for wheezing or shortness of breath. 1 Inhaler 2  . amLODipine (NORVASC) 5 MG tablet TAKE 1 TABLET (5 MG TOTAL) BY MOUTH DAILY. 90 tablet 3  . aspirin EC 81 MG tablet Take 81 mg by mouth daily.      Marland Kitchen aspirin-sod bicarb-citric acid (ALKA-SELTZER) 325 MG TBEF tablet Take 325 mg by mouth every 6 (six) hours as needed.    . cetirizine (ZYRTEC) 10 MG tablet Take 1 tablet (10 mg  total) by mouth daily. 30 tablet 11  . CINNAMON PO Take 1,000 mg by mouth 2 (two) times daily.      . clotrimazole-betamethasone (LOTRISONE) cream Apply 1 application topically 2 (two) times daily. 30 g 0  . Coenzyme Q10 (CO Q 10 PO) Take 100 mg by mouth daily.      . empagliflozin (JARDIANCE) 25 MG TABS tablet Take 25 mg by mouth daily. 90 tablet 3  . fluticasone (FLONASE) 50 MCG/ACT nasal spray PLACE 2 SPRAYS INTO BOTH NOSTRILS DAILY. 16 g 11  . furosemide (LASIX) 40 MG tablet Take 1 tablet (40 mg total) by mouth daily. 90 tablet 3  . hydrochlorothiazide (HYDRODIURIL) 25 MG tablet Take 1 tablet by mouth daily.    . insulin lispro (HUMALOG) 100 UNIT/ML KiwkPen Inject 0.07 mLs (7 Units total) into the skin daily with supper. (Patient taking differently: Inject 10 Units into the skin 3 (three) times daily. ) 5 pen 3  . KLOR-CON 10 10 MEQ tablet Take 10 mEq by mouth every 6 (six) hours.    . metFORMIN (GLUCOPHAGE) 1000 MG tablet Take 1 tablet by mouth  twice a day with meals 180 tablet 3  . metoprolol succinate (TOPROL-XL) 100 MG 24 hr tablet Take 1 tablet by  mouth  daily with or immediatley  following a meal 90 tablet 4  . nitroGLYCERIN (NITROSTAT) 0.4 MG SL tablet Place 1 tablet (0.4 mg total) under the tongue every 5 (five) minutes as needed for chest pain. 90 tablet 2  . pioglitazone (ACTOS) 30 MG tablet Take 1 tablet by mouth daily.    . Pitavastatin Calcium (LIVALO) 4 MG TABS Take 1 tablet (4 mg total) by mouth daily. 90 tablet 3  . quinapril (ACCUPRIL) 40 MG tablet Take 1 tablet by mouth two  times daily 180 tablet 3  . traMADol (ULTRAM) 50 MG tablet Take 1 tablet (50 mg total) by mouth every 8 (eight) hours as needed. 60 tablet 0  . Insulin Detemir (LEVEMIR FLEXTOUCH) 100 UNIT/ML Pen Inject 30 Units into the skin daily at 10 pm. (Patient not taking: Reported on 12/26/2016) 30 mL 1  . liraglutide (VICTOZA) 18 MG/3ML SOPN Inject 0.2 mLs (1.2 mg total) into the skin daily. (Patient not taking:  Reported on 12/26/2016) 3 pen 5  . potassium chloride (K-DUR) 10 MEQ tablet Take 1 tablet (10 mEq total) by mouth daily. 30 tablet 11   No current facility-administered medications on file prior to visit.    Allergies  Allergen Reactions  . Levaquin [Levofloxacin In D5w] Other (See Comments)    Pain all over and in joints  . Statins Other (See Comments)    Severe myalgias to lipitor, crestor, pravastatin and weakness and cramping  in bilateral leg.  [name] [Linagliptin] Itching   Social History   Social History  . Marital status: Divorced    Spouse name: N/A  . Number of children: N/A  . Years of education: N/A   Occupational History  . Not on file.   Social History Main Topics  . Smoking status: Former Smoker    Packs/day: 0.25    Types: Cigarettes    Quit date: 09/13/2011  . Smokeless tobacco: Never Used  . Alcohol use No  . Drug use: No  . Sexual activity: Not on file   Other Topics Concern  . Not on file   Social History Narrative  . No narrative on file      Review of Systems  All other systems reviewed and are negative.      Objective:   Physical Exam  Constitutional: She appears well-developed and well-nourished.  Cardiovascular: Normal rate, regular rhythm and normal heart sounds.   Pulmonary/Chest: Effort normal and breath sounds normal. No respiratory distress. She has no wheezes. She has no rales. She exhibits no tenderness.  Abdominal: Soft. Bowel sounds are normal. She exhibits no distension. There is no tenderness. There is no rebound and no guarding.  Musculoskeletal: She exhibits edema.  Vitals reviewed.         Assessment & Plan:  Uncontrolled diabetes mellitus type 2. Begin NPH 70/30, 30 units in the morning, and 15 units in the afternoon. Continue metformin 1000 mg by mouth twice a day. Continue Actos. Check fasting blood sugars and two-hour postprandial sugars in one week and titrate insulin to achieve fasting sugars less than 130  and two-hour postprandial sugars less than 180. Patient cannot tolerate any other statins generic and she cannot afford livalo.  May need to have the patient seek financial assistance to the drug manufacturer

## 2016-12-27 ENCOUNTER — Ambulatory Visit: Payer: Medicare Other | Admitting: Cardiovascular Disease

## 2016-12-28 ENCOUNTER — Telehealth: Payer: Self-pay | Admitting: Family Medicine

## 2016-12-28 NOTE — Telephone Encounter (Signed)
Ins will not cover Novolin but will cover Humulin - ok to change?

## 2016-12-29 ENCOUNTER — Other Ambulatory Visit: Payer: Self-pay | Admitting: Family Medicine

## 2016-12-29 MED ORDER — INSULIN NPH ISOPHANE & REGULAR (70-30) 100 UNIT/ML ~~LOC~~ SUSP: SUBCUTANEOUS | 11 refills | Status: DC

## 2016-12-29 MED ORDER — INSULIN ISOPHANE & REGULAR (HUMAN 70-30)100 UNIT/ML KWIKPEN: PEN_INJECTOR | SUBCUTANEOUS | 11 refills | Status: DC

## 2016-12-29 MED ORDER — INSULIN SYRINGES (DISPOSABLE) U-100 1 ML MISC: 5 refills | Status: DC

## 2016-12-29 MED ORDER — INSULIN PEN NEEDLE 31G X 5 MM MISC
5 refills | Status: DC
Start: 2016-12-29 — End: 2017-12-21

## 2016-12-29 NOTE — Telephone Encounter (Signed)
Humulin sent to pharm

## 2016-12-29 NOTE — Telephone Encounter (Signed)
ok 

## 2017-01-04 ENCOUNTER — Encounter: Payer: Self-pay | Admitting: Cardiovascular Disease

## 2017-01-18 ENCOUNTER — Other Ambulatory Visit: Payer: Self-pay | Admitting: Family Medicine

## 2017-02-09 ENCOUNTER — Other Ambulatory Visit: Payer: Self-pay | Admitting: Family Medicine

## 2017-03-20 ENCOUNTER — Encounter (HOSPITAL_COMMUNITY): Payer: Self-pay

## 2017-03-20 ENCOUNTER — Emergency Department (HOSPITAL_COMMUNITY): Payer: Medicare Other

## 2017-03-20 ENCOUNTER — Emergency Department (HOSPITAL_COMMUNITY)
Admission: EM | Admit: 2017-03-20 | Discharge: 2017-03-20 | Disposition: A | Discharge status: Home / Self Care | Payer: Medicare Other | Attending: Emergency Medicine | Admitting: Emergency Medicine

## 2017-03-20 DIAGNOSIS — Z79899 Other long term (current) drug therapy: Secondary | ICD-10-CM | POA: Diagnosis not present

## 2017-03-20 DIAGNOSIS — I110 Hypertensive heart disease with heart failure: Secondary | ICD-10-CM | POA: Insufficient documentation

## 2017-03-20 DIAGNOSIS — E119 Type 2 diabetes mellitus without complications: Secondary | ICD-10-CM | POA: Insufficient documentation

## 2017-03-20 DIAGNOSIS — Z794 Long term (current) use of insulin: Secondary | ICD-10-CM | POA: Insufficient documentation

## 2017-03-20 DIAGNOSIS — Z87891 Personal history of nicotine dependence: Secondary | ICD-10-CM | POA: Insufficient documentation

## 2017-03-20 DIAGNOSIS — Z9049 Acquired absence of other specified parts of digestive tract: Secondary | ICD-10-CM | POA: Insufficient documentation

## 2017-03-20 DIAGNOSIS — I2510 Atherosclerotic heart disease of native coronary artery without angina pectoris: Secondary | ICD-10-CM | POA: Diagnosis not present

## 2017-03-20 DIAGNOSIS — I252 Old myocardial infarction: Secondary | ICD-10-CM | POA: Insufficient documentation

## 2017-03-20 DIAGNOSIS — Z7982 Long term (current) use of aspirin: Secondary | ICD-10-CM | POA: Insufficient documentation

## 2017-03-20 DIAGNOSIS — G4489 Other headache syndrome: Secondary | ICD-10-CM | POA: Diagnosis not present

## 2017-03-20 DIAGNOSIS — R51 Headache: Secondary | ICD-10-CM | POA: Diagnosis not present

## 2017-03-20 DIAGNOSIS — R519 Headache, unspecified: Secondary | ICD-10-CM

## 2017-03-20 DIAGNOSIS — Z951 Presence of aortocoronary bypass graft: Secondary | ICD-10-CM | POA: Diagnosis not present

## 2017-03-20 DIAGNOSIS — I509 Heart failure, unspecified: Secondary | ICD-10-CM | POA: Diagnosis not present

## 2017-03-20 DIAGNOSIS — I10 Essential (primary) hypertension: Secondary | ICD-10-CM | POA: Diagnosis not present

## 2017-03-20 DIAGNOSIS — R52 Pain, unspecified: Secondary | ICD-10-CM | POA: Diagnosis not present

## 2017-03-20 LAB — RAPID URINE DRUG SCREEN, HOSP PERFORMED
Amphetamines: NOT DETECTED
BARBITURATES: NOT DETECTED
BENZODIAZEPINES: NOT DETECTED
COCAINE: NOT DETECTED
OPIATES: NOT DETECTED
Tetrahydrocannabinol: NOT DETECTED

## 2017-03-20 LAB — URINALYSIS, ROUTINE W REFLEX MICROSCOPIC
Bilirubin Urine: NEGATIVE
GLUCOSE, UA: NEGATIVE mg/dL
HGB URINE DIPSTICK: NEGATIVE
Ketones, ur: NEGATIVE mg/dL
LEUKOCYTES UA: NEGATIVE
Nitrite: NEGATIVE
PH: 5 (ref 5.0–8.0)
PROTEIN: NEGATIVE mg/dL
SPECIFIC GRAVITY, URINE: 1.012 (ref 1.005–1.030)

## 2017-03-20 LAB — COMPREHENSIVE METABOLIC PANEL
ALBUMIN: 3.4 g/dL — AB (ref 3.5–5.0)
ALT: 15 U/L (ref 14–54)
ANION GAP: 9 (ref 5–15)
AST: 17 U/L (ref 15–41)
Alkaline Phosphatase: 78 U/L (ref 38–126)
BUN: 27 mg/dL — AB (ref 6–20)
CHLORIDE: 104 mmol/L (ref 101–111)
CO2: 24 mmol/L (ref 22–32)
Calcium: 9 mg/dL (ref 8.9–10.3)
Creatinine, Ser: 1.34 mg/dL — ABNORMAL HIGH (ref 0.44–1.00)
GFR calc Af Amer: 46 mL/min — ABNORMAL LOW (ref >60)
GFR, EST NON AFRICAN AMERICAN: 39 mL/min — AB (ref >60)
Glucose, Bld: 168 mg/dL — ABNORMAL HIGH (ref 65–99)
POTASSIUM: 3.6 mmol/L (ref 3.5–5.1)
Sodium: 137 mmol/L (ref 135–145)
TOTAL PROTEIN: 6.4 g/dL — AB (ref 6.5–8.1)
Total Bilirubin: 0.4 mg/dL (ref 0.3–1.2)

## 2017-03-20 LAB — CBG MONITORING, ED: Glucose-Capillary: 132 mg/dL — ABNORMAL HIGH (ref 65–99)

## 2017-03-20 LAB — I-STAT TROPONIN, ED: TROPONIN I, POC: 0.01 ng/mL (ref 0.00–0.08)

## 2017-03-20 LAB — DIFFERENTIAL
BASOS ABS: 0 10*3/uL (ref 0.0–0.1)
Basophils Relative: 0 %
EOS ABS: 0 10*3/uL (ref 0.0–0.7)
EOS PCT: 0 %
Lymphocytes Relative: 14 %
Lymphs Abs: 1.1 10*3/uL (ref 0.7–4.0)
MONOS PCT: 4 %
Monocytes Absolute: 0.3 10*3/uL (ref 0.1–1.0)
Neutro Abs: 6.4 10*3/uL (ref 1.7–7.7)
Neutrophils Relative %: 82 %

## 2017-03-20 LAB — I-STAT CHEM 8, ED
BUN: 36 mg/dL — ABNORMAL HIGH (ref 6–20)
Calcium, Ion: 1.15 mmol/L (ref 1.15–1.40)
Chloride: 105 mmol/L (ref 101–111)
Creatinine, Ser: 1.3 mg/dL — ABNORMAL HIGH (ref 0.44–1.00)
Glucose, Bld: 153 mg/dL — ABNORMAL HIGH (ref 65–99)
HEMATOCRIT: 44 % (ref 36.0–46.0)
HEMOGLOBIN: 15 g/dL (ref 12.0–15.0)
Potassium: 3.4 mmol/L — ABNORMAL LOW (ref 3.5–5.1)
SODIUM: 141 mmol/L (ref 135–145)
TCO2: 24 mmol/L (ref 0–100)

## 2017-03-20 LAB — CBC
HCT: 37.1 % (ref 36.0–46.0)
Hemoglobin: 12.7 g/dL (ref 12.0–15.0)
MCH: 26.3 pg (ref 26.0–34.0)
MCHC: 34.2 g/dL (ref 30.0–36.0)
MCV: 77 fL — ABNORMAL LOW (ref 78.0–100.0)
Platelets: 217 10*3/uL (ref 150–400)
RBC: 4.82 MIL/uL (ref 3.87–5.11)
RDW: 17.3 % — AB (ref 11.5–15.5)
WBC: 7.8 10*3/uL (ref 4.0–10.5)

## 2017-03-20 LAB — APTT: APTT: 36 s (ref 24–36)

## 2017-03-20 LAB — PROTIME-INR
INR: 0.91
Prothrombin Time: 12.3 seconds (ref 11.4–15.2)

## 2017-03-20 MED ORDER — PROCHLORPERAZINE EDISYLATE 5 MG/ML IJ SOLN
5.0000 mg | Freq: Once | INTRAMUSCULAR | Status: AC
  Administered 2017-03-20: 5 mg via INTRAVENOUS
  Filled 2017-03-20: qty 2

## 2017-03-20 MED ORDER — MORPHINE SULFATE (PF) 4 MG/ML IV SOLN
4.0000 mg | Freq: Once | INTRAVENOUS | Status: AC
  Administered 2017-03-20: 4 mg via INTRAVENOUS
  Filled 2017-03-20: qty 1

## 2017-03-20 MED ORDER — ONDANSETRON HCL 4 MG/2ML IJ SOLN
4.0000 mg | Freq: Once | INTRAMUSCULAR | Status: AC
  Administered 2017-03-20: 4 mg via INTRAVENOUS
  Filled 2017-03-20: qty 2

## 2017-03-20 MED ORDER — KETOROLAC TROMETHAMINE 30 MG/ML IJ SOLN
15.0000 mg | Freq: Once | INTRAMUSCULAR | Status: AC
  Administered 2017-03-20: 15 mg via INTRAVENOUS
  Filled 2017-03-20: qty 1

## 2017-03-20 MED ORDER — SODIUM CHLORIDE 0.9 % IV BOLUS (SEPSIS)
500.0000 mL | Freq: Once | INTRAVENOUS | Status: AC
  Administered 2017-03-20: 500 mL via INTRAVENOUS

## 2017-03-20 MED ORDER — DIPHENHYDRAMINE HCL 50 MG/ML IJ SOLN
12.5000 mg | Freq: Once | INTRAMUSCULAR | Status: AC
  Administered 2017-03-20: 12.5 mg via INTRAVENOUS
  Filled 2017-03-20: qty 1

## 2017-03-20 NOTE — ED Provider Notes (Signed)
[facility] DEPT Provider Note   CSN: [id number] Arrival date & time: 03/20/17  1046     History   Chief Complaint Chief Complaint  Patient presents with  . Headache  . Hypoglycemia    HPI Brenda Moreno is a 71 y.o. female who presents emergency Department with chief complaint of headache. She has a past medical history of coronary artery disease, CHF, peripheral vascular disease, previous MI, and insulin-dependent diabetes mellitus. Patient states that around 8 AM this morning she had sudden onset of a severe left-sided headache. She states that she never gets headaches and this is very abnormal for her. She has subjective left-sided weakness. She has associated photophobia and phonophobia. She denies nausea, vomiting, difficulty with speech, changes in her vision. She denies any problems with her teeth or ears.  The patient denies any family history of cerebral aneurysm or hemorrhage.Marland Kitchen   HPI  Past Medical History:  Diagnosis Date  . Arthritis   . CAD (coronary artery disease)    S/P cabg  . Carotid artery occlusion   . CHF (congestive heart failure) ([place])   . DDD (degenerative disc disease), lumbar   . Diabetes mellitus   . Hyperlipidemia   . Hypertension   . Joint pain   . Leg pain   . Myocardial infarction ([place])   . Peripheral vascular disease ([place])   . PVD (peripheral vascular disease) [facility])     Patient Active Problem List   Diagnosis Date Noted  . Coronary artery disease involving native coronary artery of native heart without angina pectoris 06/02/2016  . DDD (degenerative disc disease), lumbar   . Diarrhea 07/02/2014  . Fecal incontinence 07/02/2014  . Aftercare following surgery of the circulatory system, [place] 09/26/2013  . 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 Acute sinus infection 07/14/2013  . Arthritis   . CHF (congestive heart failure) ([place])   . Diabetes mellitus, type II, insulin dependent ([place])   . Hyperlipidemia   .  Hypertension   . Myocardial infarction ([place])   . PVD (peripheral vascular disease) ([place])   . Peripheral vascular disease, unspecified ([place]) 09/27/2012  . Leg cramps 09/27/2012  . Atherosclerosis of native arteries of the extremities with intermittent claudication 09/22/2011  . DIABETES MELLITUS 11/22/2007  . HYPERCHOLESTEROLEMIA 11/22/2007  . Benign essential HTN 11/22/2007  . HEART ATTACK 11/22/2007  . CAD 11/22/2007  . RENAL CALCULUS 11/22/2007    Past Surgical History:  Procedure Laterality Date  . APPENDECTOMY    . BREAST EXCISIONAL BIOPSY Left 2008  . CHOLECYSTECTOMY     Gall bladder  . CORONARY ARTERY BYPASS GRAFT     1998  . PR VEIN BYPASS GRAFT,AORTO-FEM-POP    . TUBAL LIGATION      OB History    No data available       Home Medications    Prior to Admission medications   Medication Sig Start Date End Date Taking? Authorizing Provider  albuterol (PROVENTIL HFA;VENTOLIN HFA) 108 (90 Base) MCG/ACT inhaler Inhale 2 puffs into the lungs every 6 (six) hours as needed for wheezing or shortness of breath. 07/04/16   Dena Billet B, PA-C  amLODipine (NORVASC) 5 MG tablet TAKE 1 TABLET (5 MG TOTAL) BY MOUTH DAILY. 04/27/16   Susy Frizzle, MD  aspirin EC 81 MG tablet Take 81 mg by mouth daily.      [provider]  aspirin-sod bicarb-citric acid (ALKA-SELTZER) 325 MG TBEF tablet Take 325 mg by mouth every 6 (  six) hours as needed.    [provider]  cetirizine (ZYRTEC) 10 MG tablet Take 1 tablet (10 mg total) by mouth daily. 11/10/16   Orlena Sheldon, PA-C  CINNAMON PO Take 1,000 mg by mouth 2 (two) times daily.      [provider]  clotrimazole-betamethasone (LOTRISONE) cream Apply 1 application topically 2 (two) times daily. 05/03/16   Susy Frizzle, MD  Coenzyme Q10 (CO Q 10 PO) Take 100 mg by mouth daily.      [provider]  fluticasone (FLONASE) 50 MCG/ACT nasal spray PLACE 2 SPRAYS INTO BOTH NOSTRILS DAILY. 01/19/17   Susy Frizzle, MD  furosemide (LASIX) 40 MG tablet Take 1 tablet (40 mg total) by mouth daily. 10/13/16   Susy Frizzle, MD  hydrochlorothiazide (HYDRODIURIL) 25 MG tablet Take 1 tablet by mouth daily. 07/27/16   [provider]  insulin NPH-regular Human (NOVOLIN 70/30) (70-30) 100 UNIT/ML injection 30 U QAM / 15 U QPM 12/29/16   Susy Frizzle, MD  Insulin Pen Needle 31G X 5 MM MISC Use with insulin pen DX E11.9 12/29/16   Susy Frizzle, MD  Insulin Syringes, Disposable, U-100 1 ML MISC Use with insulin BID 12/29/16   Susy Frizzle, MD  KLOR-CON 10 10 MEQ tablet Take 10 mEq by mouth every 6 (six) hours. 10/22/16   [provider]  LIVALO 4 MG TABS TAKE 1 TABLET BY MOUTH EVERY DAY 02/09/17   Susy Frizzle, MD  metFORMIN (GLUCOPHAGE) 1000 MG tablet Take 1 tablet (1,000 mg total) by mouth 2 (two) times daily with a meal. 12/26/16   Susy Frizzle, MD  metoprolol succinate (TOPROL-XL) 100 MG 24 hr tablet Take 1 tablet by mouth  daily with or immediatley  following a meal 11/30/15   Susy Frizzle, MD  nitroGLYCERIN (NITROSTAT) 0.4 MG SL tablet Place 1 tablet (0.4 mg total) under the tongue every 5 (five) minutes as needed for chest pain. 11/12/14   Susy Frizzle, MD  pioglitazone (ACTOS) 30 MG tablet Take 1 tablet (30 mg total) by mouth daily. 12/26/16   Susy Frizzle, MD  potassium chloride (K-DUR) 10 MEQ tablet Take 1 tablet (10 mEq total) by mouth daily. 06/02/16 12/26/16  Nahser, Wonda Cheng, MD  quinapril (ACCUPRIL) 40 MG tablet Take 1 tablet by mouth two  times daily 04/25/16   Susy Frizzle, MD  traMADol (ULTRAM) 50 MG tablet Take 1 tablet (50 mg total) by mouth every 8 (eight) hours as needed. 07/08/16   Orlena Sheldon, PA-C    Family History Family History  Problem Relation Age of Onset  . Heart disease Mother   . Diabetes Mother   . Hyperlipidemia Mother   . Hypertension Mother   . Heart disease Father        Heart Disease before age 62  . Diabetes  Father   . Hyperlipidemia Father   . Hypertension Father   . Heart disease Sister        heart attack  . Diabetes Sister        Amputation  . Hypertension Sister   . Other Sister        history of amputation  . Heart attack Sister   . Diabetes Brother   . Hypertension Brother   . Heart disease Brother 10       Before age 21  . Hyperlipidemia Brother   . Diabetes Brother  scirrosis of liver  . Coronary artery disease Other     Social History Social History  Substance Use Topics  . Smoking status: Former Smoker    Packs/day: 0.25    Types: Cigarettes    Quit date: 09/13/2011  . Smokeless tobacco: Never Used  . Alcohol use No     Allergies   Levaquin [levofloxacin in d5w]; Statins; and Tradjenta [linagliptin]   Review of Systems Review of Systems Ten systems reviewed and are negative for acute change, except as noted in the HPI.    Physical Exam Updated Vital Signs BP (!) 150/70   Pulse 65   Temp 98.2 F (36.8 C) (Oral)   Resp 16   Ht 5\' 6"  (1.676 m)   Wt 79.4 kg (175 lb)   SpO2 99%   BMI 28.25 kg/m   Physical Exam  Constitutional: She is oriented to person, place, and time. She appears well-developed and well-nourished. No distress.  HENT:  Head: Normocephalic and atraumatic.  Right Ear: External ear normal.  Left Ear: External ear normal.  Mouth/Throat: Oropharynx is clear and moist. No oropharyngeal exudate.  Eyes: Pupils are equal, round, and reactive to light. Conjunctivae and EOM are normal. No scleral icterus.  No horizontal, vertical or rotational nystagmus  Neck: Normal range of motion. Neck supple. No JVD present. No thyromegaly present.  Full active and passive ROM without pain No midline or paraspinal tenderness No nuchal rigidity or meningeal signs  Cardiovascular: Normal rate, regular rhythm, normal heart sounds and intact distal pulses.  Exam reveals no gallop and no friction rub.   No murmur heard. Pulmonary/Chest: Effort normal  and breath sounds normal. No respiratory distress. She has no wheezes. She has no rales.  Abdominal: Soft. Bowel sounds are normal. She exhibits no distension and no mass. There is no tenderness. There is no rebound and no guarding.  Musculoskeletal: Normal range of motion. She exhibits no edema or tenderness.  No meningismus  Lymphadenopathy:    She has no cervical adenopathy.  Neurological: She is alert and oriented to person, place, and time. She has normal reflexes. No cranial nerve deficit. She exhibits normal muscle tone. Coordination normal.  Mental Status:  Alert, oriented, thought content appropriate. Speech fluent without evidence of aphasia. Able to follow 2 step commands without difficulty.  Cranial Nerves:  II:  Peripheral visual fields grossly normal, pupils equal, round, reactive to light III,IV, VI: ptosis not present, extra-ocular motions intact bilaterally  V,VII: smile symmetric, facial light touch sensation equal VIII: hearing grossly normal bilaterally  IX,X: midline uvula rise  XI: bilateral shoulder shrug equal and strong XII: midline tongue extension  Motor:  5/5 in upper and lower extremities bilaterally including strong and equal grip strength and dorsiflexion/plantar flexion Sensory: Pinprick and light touch normal in all extremities.  Cerebellar: normal finger-to-nose with bilateral upper extremities Gait: normal gait and balance CV: distal pulses palpable throughout   Skin: Skin is warm and dry. No rash noted. She is not diaphoretic.  Psychiatric: She has a normal mood and affect. Her behavior is normal. Judgment and thought content normal.  Nursing note and vitals reviewed.    ED Treatments / Results  Labs (all labs ordered are listed, but only abnormal results are displayed) Labs Reviewed  CBG MONITORING, ED - Abnormal; Notable for the following:       Result Value   Glucose-Capillary 132 (*)    All other components within normal limits  ETHANOL    PROTIME-INR  APTT  CBC  DIFFERENTIAL  COMPREHENSIVE METABOLIC PANEL  RAPID URINE DRUG SCREEN, HOSP PERFORMED  URINALYSIS, ROUTINE W REFLEX MICROSCOPIC  I-STAT CHEM 8, ED  I-STAT TROPONIN, ED    EKG  EKG Interpretation None       Radiology No results found.  Procedures Procedures (including critical care time)  Medications Ordered in ED Medications  morphine 4 MG/ML injection 4 mg (not administered)  ondansetron (ZOFRAN) injection 4 mg (not administered)     Initial Impression / Assessment and Plan / ED Course  I have reviewed the triage vital signs and the nursing notes.  Pertinent labs & imaging results that were available during my care of the patient were reviewed by me and considered in my medical decision making (see chart for details).     Pt HA treated and improved while in ED. CT is negative. No concern for for [facility], ICH, Meningitis, or temporal arteritis. Pt is afebrile with no focal neuro deficits, nuchal rigidity, or change in vision. Pt is to follow up with PCP to discuss prophylactic medication. Pt verbalizes understanding and is agreeable with plan to dc.    Final Clinical Impressions(s) / ED Diagnoses   Final diagnoses:  Bad headache    New Prescriptions New Prescriptions   No medications on file     Ned Grace 03/20/17 2022    Jola Schmidt, MD 03/23/17 2326

## 2017-03-20 NOTE — ED Triage Notes (Signed)
Pt brought in by EMS due to having left sided headache and hypoglycemia. Pt CBG was 50, pt was given PO fluids and food. Pt CBG came up to 110 after PO intake. Per EMS, pt still confused after increase of CBG. Pt c/o left sided headache still, but is a&ox4 at this time. Pt denies visual changes. Pt endorses nausea, but no vomiting.

## 2017-03-20 NOTE — Discharge Instructions (Signed)

## 2017-03-27 ENCOUNTER — Ambulatory Visit (INDEPENDENT_AMBULATORY_CARE_PROVIDER_SITE_OTHER): Payer: Medicare Other | Admitting: Family Medicine

## 2017-03-27 ENCOUNTER — Encounter: Payer: Self-pay | Admitting: Family Medicine

## 2017-03-27 VITALS — BP 132/70 | HR 74 | Temp 98.3°F | Resp 16 | Ht 64.0 in | Wt 186.0 lb

## 2017-03-27 DIAGNOSIS — M5431 Sciatica, right side: Secondary | ICD-10-CM

## 2017-03-27 MED ORDER — PREDNISONE 20 MG PO TABS: ORAL_TABLET | ORAL | 0 refills | Status: DC

## 2017-03-27 MED ORDER — OXYCODONE-ACETAMINOPHEN 7.5-325 MG PO TABS: 1.0000 | ORAL_TABLET | ORAL | 0 refills | Status: DC | PRN

## 2017-03-27 NOTE — Progress Notes (Signed)
Subjective:    Patient ID: Brenda Moreno, female    DOB: 1946/07/08, 71 y.o.   MRN: [id number]  HPI  Patient presents with one-week of severe pain beginning in her lower back on the right side radiating down her right leg into her right foot. It hurts to walk on her right leg. She reports burning and stinging pain in the right leg. Patient has a history of sciatica. In 2013, she had an MRI of the lumbar spine that revealed 2 right-sided bulging disc causing nerve impingement. At that time she had epidural steroid injections and the pain got better. This pain began spontaneously approximately 1 week ago. She denies any bowel or bladder incontinence. However she has been off her insulin now for quite some time. She states that her sugars are running in the 200s without any treatment. Past Medical History:  Diagnosis Date  . Arthritis   . CAD (coronary artery disease)    S/P cabg  . Carotid artery occlusion   . CHF (congestive heart failure) ([place])   . DDD (degenerative disc disease), lumbar   . Diabetes mellitus   . Hyperlipidemia   . Hypertension   . Joint pain   . Leg pain   . Myocardial infarction ([place])   . Peripheral vascular disease ([place])   . PVD (peripheral vascular disease) ([place])    Past Surgical History:  Procedure Laterality Date  . APPENDECTOMY    . BREAST EXCISIONAL BIOPSY Left 2008  . CHOLECYSTECTOMY     Gall bladder  . CORONARY ARTERY BYPASS GRAFT     1998  . PR VEIN BYPASS GRAFT,AORTO-FEM-POP    . TUBAL LIGATION     Current Outpatient Prescriptions on File Prior to Visit  Medication Sig Dispense Refill  . albuterol (PROVENTIL HFA;VENTOLIN HFA) 108 (90 Base) MCG/ACT inhaler Inhale 2 puffs into the lungs every 6 (six) hours as needed for wheezing or shortness of breath. 1 Inhaler 2  . amLODipine (NORVASC) 5 MG tablet TAKE 1 TABLET (5 MG TOTAL) BY MOUTH DAILY. 90 tablet 3  . aspirin EC 81 MG tablet Take 81 mg by mouth daily.      . cetirizine (ZYRTEC) 10 MG tablet  Take 1 tablet (10 mg total) by mouth daily. 30 tablet 11  . CINNAMON PO Take 1,000 mg by mouth 2 (two) times daily.      . clotrimazole-betamethasone (LOTRISONE) cream Apply 1 application topically 2 (two) times daily. 30 g 0  . Coenzyme Q10 (CO Q 10 PO) Take 100 mg by mouth daily.      . fluticasone (FLONASE) 50 MCG/ACT nasal spray PLACE 2 SPRAYS INTO BOTH NOSTRILS DAILY. 16 g 3  . furosemide (LASIX) 40 MG tablet Take 1 tablet (40 mg total) by mouth daily. 90 tablet 3  . hydrochlorothiazide (HYDRODIURIL) 25 MG tablet Take 25 mg by mouth daily.     Marland Kitchen ibuprofen (ADVIL,MOTRIN) 800 MG tablet Take 1,600 mg by mouth every 8 (eight) hours. 6 days left    . metFORMIN (GLUCOPHAGE) 1000 MG tablet Take 1 tablet (1,000 mg total) by mouth 2 (two) times daily with a meal. 180 tablet 3  . metoprolol succinate (TOPROL-XL) 100 MG 24 hr tablet Take 1 tablet by mouth  daily with or immediatley  following a meal (Patient taking differently: Take 100MG  by mouth  daily with or immediatley  following a meal) 90 tablet 4  . nitroGLYCERIN (NITROSTAT) 0.4 MG SL tablet Place 1 tablet (0.4 mg total) under the  tongue every 5 (five) minutes as needed for chest pain. 90 tablet 2  . pioglitazone (ACTOS) 30 MG tablet Take 1 tablet (30 mg total) by mouth daily. 30 tablet 5  . potassium chloride (K-DUR) 10 MEQ tablet Take 1 tablet (10 mEq total) by mouth daily. 30 tablet 11  . quinapril (ACCUPRIL) 40 MG tablet Take 1 tablet by mouth two  times daily 180 tablet 3  . insulin NPH-regular Human (NOVOLIN 70/30) (70-30) 100 UNIT/ML injection 30 U QAM / 15 U QPM (Patient not taking: Reported on 03/27/2017) 10 mL 11  . Insulin Pen Needle 31G X 5 MM MISC Use with insulin pen DX E11.9 (Patient not taking: Reported on 03/20/2017) 100 each 5  . Insulin Syringes, Disposable, U-100 1 ML MISC Use with insulin BID (Patient not taking: Reported on 03/20/2017) 100 each 5  . LIVALO 4 MG TABS TAKE 1 TABLET BY MOUTH EVERY DAY (Patient not taking: Reported  on 03/20/2017) 90 tablet 0  . traMADol (ULTRAM) 50 MG tablet Take 1 tablet (50 mg total) by mouth every 8 (eight) hours as needed. (Patient not taking: Reported on 03/27/2017) 60 tablet 0   No current facility-administered medications on file prior to visit.    Allergies  Allergen Reactions  . Levaquin [Levofloxacin In D5w] Other (See Comments)    Pain all over and in joints  . Statins Other (See Comments)    Severe myalgias to lipitor, crestor, pravastatin and weakness and cramping  in bilateral leg.  [name] [Linagliptin] Itching   Social History   Social History  . Marital status: Divorced    Spouse name: N/A  . Number of children: N/A  . Years of education: N/A   Occupational History  . Not on file.   Social History Main Topics  . Smoking status: Former Smoker    Packs/day: 0.25    Types: Cigarettes    Quit date: 09/13/2011  . Smokeless tobacco: Never Used  . Alcohol use No  . Drug use: No  . Sexual activity: Not on file   Other Topics Concern  . Not on file   Social History Narrative  . No narrative on file     Review of Systems  All other systems reviewed and are negative.      Objective:   Physical Exam  Constitutional: She appears well-developed and well-nourished.  Cardiovascular: Normal rate, regular rhythm and normal heart sounds.   Pulmonary/Chest: Effort normal and breath sounds normal.  Musculoskeletal:       Lumbar back: She exhibits decreased range of motion, tenderness and pain.       Back:  Neurological: She has normal reflexes. She displays normal reflexes. She exhibits normal muscle tone. Coordination normal.  Vitals reviewed.         Assessment & Plan:  Right sided sciatica  I explained to the patient that she cannot take prednisone until we get her sugars under better control. She will begin her insulin 70/30 as prescribed in May.  After 48 hours, once her sugars and hopefully fallen below 200, she can then begin the prednisone  taper pack. Meanwhile begin Percocet 7.5/325 one by mouth every 6 hours when necessary pain. Recheck in one week if no better or sooner if worse

## 2017-03-28 ENCOUNTER — Telehealth: Payer: Self-pay | Admitting: Family Medicine

## 2017-03-28 NOTE — Telephone Encounter (Signed)
Patient says she received a call saying that it was time for her to have a colonoscopy, however she says she feels she has had one, would like a call back regarding this [phone]

## 2017-03-29 NOTE — Telephone Encounter (Signed)
Tried to call pt no answer and no vm - last colonoscopy was 05/04/07. Due for 10 year recheck.

## 2017-04-05 NOTE — Telephone Encounter (Signed)
Tried to call no answer no vm

## 2017-04-13 ENCOUNTER — Other Ambulatory Visit: Payer: Self-pay | Admitting: Family Medicine

## 2017-04-13 NOTE — Telephone Encounter (Signed)
Medication refilled per protocol. 

## 2017-04-24 ENCOUNTER — Telehealth: Payer: Self-pay | Admitting: Family Medicine

## 2017-04-24 DIAGNOSIS — M5136 Other intervertebral disc degeneration, lumbar region: Secondary | ICD-10-CM

## 2017-04-24 DIAGNOSIS — M51369 Other intervertebral disc degeneration, lumbar region without mention of lumbar back pain or lower extremity pain: Secondary | ICD-10-CM

## 2017-04-24 NOTE — Telephone Encounter (Signed)
Patient would like to know if we can refer her to [facility] she has seen them in the past. She was last seen here on 03/27/2017 for back pain.  CB# [phone]

## 2017-04-27 NOTE — Telephone Encounter (Signed)
Referral placed.

## 2017-04-30 ENCOUNTER — Emergency Department (HOSPITAL_COMMUNITY)
Admission: EM | Admit: 2017-04-30 | Discharge: 2017-04-30 | Disposition: A | Discharge status: Home / Self Care | Payer: Medicare Other | Attending: Emergency Medicine | Admitting: Emergency Medicine

## 2017-04-30 ENCOUNTER — Encounter (HOSPITAL_COMMUNITY): Payer: Self-pay | Admitting: Emergency Medicine

## 2017-04-30 DIAGNOSIS — E162 Hypoglycemia, unspecified: Secondary | ICD-10-CM | POA: Diagnosis not present

## 2017-04-30 DIAGNOSIS — Z79899 Other long term (current) drug therapy: Secondary | ICD-10-CM | POA: Diagnosis not present

## 2017-04-30 DIAGNOSIS — I110 Hypertensive heart disease with heart failure: Secondary | ICD-10-CM | POA: Diagnosis not present

## 2017-04-30 DIAGNOSIS — Z7984 Long term (current) use of oral hypoglycemic drugs: Secondary | ICD-10-CM | POA: Diagnosis not present

## 2017-04-30 DIAGNOSIS — R42 Dizziness and giddiness: Secondary | ICD-10-CM | POA: Diagnosis present

## 2017-04-30 DIAGNOSIS — E161 Other hypoglycemia: Secondary | ICD-10-CM | POA: Diagnosis not present

## 2017-04-30 DIAGNOSIS — I509 Heart failure, unspecified: Secondary | ICD-10-CM | POA: Diagnosis not present

## 2017-04-30 DIAGNOSIS — I2510 Atherosclerotic heart disease of native coronary artery without angina pectoris: Secondary | ICD-10-CM | POA: Diagnosis not present

## 2017-04-30 DIAGNOSIS — Z87891 Personal history of nicotine dependence: Secondary | ICD-10-CM | POA: Diagnosis not present

## 2017-04-30 DIAGNOSIS — E11649 Type 2 diabetes mellitus with hypoglycemia without coma: Secondary | ICD-10-CM | POA: Diagnosis not present

## 2017-04-30 DIAGNOSIS — Z794 Long term (current) use of insulin: Secondary | ICD-10-CM | POA: Diagnosis not present

## 2017-04-30 LAB — CBG MONITORING, ED
Glucose-Capillary: 206 mg/dL — ABNORMAL HIGH (ref 65–99)
Glucose-Capillary: 266 mg/dL — ABNORMAL HIGH (ref 65–99)

## 2017-04-30 NOTE — ED Provider Notes (Signed)
[facility] DEPT Provider Note   CSN: [id number] Arrival date & time: 04/30/17  1018     History   Chief Complaint Chief Complaint  Patient presents with  . Hypoglycemia    HPI Brenda Moreno is a 71 y.o. female.  71 year old female presents after having hypoglycemic episodes at home. She felt weak and dizzy and blood sugar was checked was 45. Was given food EMS was called. Feels better at this time. Admits to recent change to her insulin regimen about a month ago as well as decreased oral intake secondary to back pain. No fever, chills, emesis. No chest or abdominal discomfort. Feels back to her baseline at this time. Did have a very light dinner last night.      Past Medical History:  Diagnosis Date  . Arthritis   . CAD (coronary artery disease)    S/P cabg  . Carotid artery occlusion   . CHF (congestive heart failure) ([facility])   . DDD (degenerative disc disease), lumbar   . Diabetes mellitus   . Hyperlipidemia   . Hypertension   . Joint pain   . Leg pain   . Myocardial infarction ([place])   . Peripheral vascular disease ([place])   . PVD (peripheral vascular disease) [facility])     Patient Active Problem List   Diagnosis Date Noted  . Coronary artery disease involving native coronary artery of native heart without angina pectoris 06/02/2016  . DDD (degenerative disc disease), lumbar   . Diarrhea 07/02/2014  . Fecal incontinence 07/02/2014  . Aftercare following surgery of the circulatory system, [place] 09/26/2013  . 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 Acute sinus infection 07/14/2013  . Arthritis   . CHF (congestive heart failure) ([place])   . Diabetes mellitus, type II, insulin dependent ([place])   . Hyperlipidemia   . Hypertension   . Myocardial infarction ([place])   . PVD (peripheral vascular disease) ([place])   . Peripheral vascular disease, unspecified ([place]) 09/27/2012  . Leg cramps 09/27/2012  . Atherosclerosis of native arteries of the extremities  with intermittent claudication 09/22/2011  . DIABETES MELLITUS 11/22/2007  . HYPERCHOLESTEROLEMIA 11/22/2007  . Benign essential HTN 11/22/2007  . HEART ATTACK 11/22/2007  . CAD 11/22/2007  . RENAL CALCULUS 11/22/2007    Past Surgical History:  Procedure Laterality Date  . APPENDECTOMY    . BREAST EXCISIONAL BIOPSY Left 2008  . CHOLECYSTECTOMY     Gall bladder  . CORONARY ARTERY BYPASS GRAFT     1998  . PR VEIN BYPASS GRAFT,AORTO-FEM-POP    . TUBAL LIGATION      OB History    No data available       Home Medications    Prior to Admission medications   Medication Sig Start Date End Date Taking? Authorizing Provider  albuterol (PROVENTIL HFA;VENTOLIN HFA) 108 (90 Base) MCG/ACT inhaler Inhale 2 puffs into the lungs every 6 (six) hours as needed for wheezing or shortness of breath. 07/04/16   Dena Billet B, PA-C  amLODipine (NORVASC) 5 MG tablet TAKE 1 TABLET BY MOUTH  DAILY 04/13/17   Susy Frizzle, MD  aspirin EC 81 MG tablet Take 81 mg by mouth daily.      [provider]  cetirizine (ZYRTEC) 10 MG tablet Take 1 tablet (10 mg total) by mouth daily. 11/10/16   Orlena Sheldon, PA-C  CINNAMON PO Take 1,000 mg by mouth 2 (two) times daily.      [provider]  clotrimazole-betamethasone (LOTRISONE) cream Apply 1 application topically 2 (two) times daily. 05/03/16   Susy Frizzle, MD  Coenzyme Q10 (CO Q 10 PO) Take 100 mg by mouth daily.      [provider]  fluticasone (FLONASE) 50 MCG/ACT nasal spray PLACE 2 SPRAYS INTO BOTH NOSTRILS DAILY. 01/19/17   Susy Frizzle, MD  furosemide (LASIX) 40 MG tablet Take 1 tablet (40 mg total) by mouth daily. 10/13/16   Susy Frizzle, MD  hydrochlorothiazide (HYDRODIURIL) 25 MG tablet Take 25 mg by mouth daily.  07/27/16   [provider]  ibuprofen (ADVIL,MOTRIN) 800 MG tablet Take 1,600 mg by mouth every 8 (eight) hours. 6 days left 03/13/17   [provider]  insulin NPH-regular Human  (NOVOLIN 70/30) (70-30) 100 UNIT/ML injection 30 U QAM / 15 U QPM Patient not taking: Reported on 03/27/2017 12/29/16   Susy Frizzle, MD  Insulin Pen Needle 31G X 5 MM MISC Use with insulin pen DX E11.9 Patient not taking: Reported on 03/20/2017 12/29/16   Susy Frizzle, MD  Insulin Syringes, Disposable, U-100 1 ML MISC Use with insulin BID Patient not taking: Reported on 03/20/2017 12/29/16   Susy Frizzle, MD  liraglutide 18 MG/3ML SOPN Inject into the skin.    [provider]  LIVALO 4 MG TABS TAKE 1 TABLET BY MOUTH EVERY DAY Patient not taking: Reported on 03/20/2017 02/09/17   Susy Frizzle, MD  metFORMIN (GLUCOPHAGE) 1000 MG tablet TAKE 1 TABLET BY MOUTH  TWICE A DAY WITH MEALS 04/13/17   Susy Frizzle, MD  metoprolol succinate (TOPROL-XL) 100 MG 24 hr tablet Take 1 tablet by mouth  daily with or immediatley  following a meal Patient taking differently: Take 100MG  by mouth  daily with or immediatley  following a meal 11/30/15   Susy Frizzle, MD  nitroGLYCERIN (NITROSTAT) 0.4 MG SL tablet Place 1 tablet (0.4 mg total) under the tongue every 5 (five) minutes as needed for chest pain. 11/12/14   Susy Frizzle, MD  oxyCODONE-acetaminophen (PERCOCET) 7.5-325 MG tablet Take 1 tablet by mouth every 4 (four) hours as needed for severe pain. 03/27/17   Susy Frizzle, MD  pioglitazone (ACTOS) 30 MG tablet TAKE 1 TABLET BY MOUTH  DAILY 04/13/17   Susy Frizzle, MD  potassium chloride (K-DUR) 10 MEQ tablet Take 1 tablet (10 mEq total) by mouth daily. 06/02/16 03/27/17  Nahser, Wonda Cheng, MD  predniSONE (DELTASONE) 20 MG tablet 3 tabs poqday 1-2, 2 tabs poqday 3-4, 1 tab poqday 5-6 03/27/17   Susy Frizzle, MD  quinapril (ACCUPRIL) 40 MG tablet TAKE 1 TABLET BY MOUTH TWO  TIMES DAILY 04/13/17   Susy Frizzle, MD  traMADol (ULTRAM) 50 MG tablet Take 1 tablet (50 mg total) by mouth every 8 (eight) hours as needed. Patient not taking: Reported on 03/27/2017 07/08/16   Orlena Sheldon, PA-C    Family History Family History  Problem Relation Age of Onset  . Heart disease Mother   . Diabetes Mother   . Hyperlipidemia Mother   . Hypertension Mother   . Heart disease Father        Heart Disease before age 17  . Diabetes Father   . Hyperlipidemia Father   . Hypertension Father   . Heart disease Sister        heart attack  . Diabetes Sister        Amputation  . Hypertension Sister   .  Other Sister        history of amputation  . Heart attack Sister   . Diabetes Brother   . Hypertension Brother   . Heart disease Brother 25       Before age 4  . Hyperlipidemia Brother   . Diabetes Brother        scirrosis of liver  . Coronary artery disease Other     Social History Social History  Substance Use Topics  . Smoking status: Former Smoker    Packs/day: 0.25    Types: Cigarettes    Quit date: 09/13/2011  . Smokeless tobacco: Never Used  . Alcohol use No     Allergies   Levaquin [levofloxacin in d5w]; Statins; and Tradjenta [linagliptin]   Review of Systems Review of Systems  All other systems reviewed and are negative.    Physical Exam Updated Vital Signs BP (!) 159/74 (BP Location: Right Arm)   Pulse 66   Temp 98 F (36.7 C) (Oral)   Resp 16   Ht 1.651 m (5\' 5" )   Wt 84.4 kg (186 lb)   SpO2 99%   BMI 30.95 kg/m   Physical Exam  Constitutional: She is oriented to person, place, and time. She appears well-developed and well-nourished.  Non-toxic appearance. No distress.  HENT:  Head: Normocephalic and atraumatic.  Eyes: Pupils are equal, round, and reactive to light. Conjunctivae, EOM and lids are normal.  Neck: Normal range of motion. Neck supple. No tracheal deviation present. No thyroid mass present.  Cardiovascular: Normal rate, regular rhythm and normal heart sounds.  Exam reveals no gallop.   No murmur heard. Pulmonary/Chest: Effort normal and breath sounds normal. No stridor. No respiratory distress. She has no decreased  breath sounds. She has no wheezes. She has no rhonchi. She has no rales.  Abdominal: Soft. Normal appearance and bowel sounds are normal. She exhibits no distension. There is no tenderness. There is no rebound and no CVA tenderness.  Musculoskeletal: Normal range of motion. She exhibits no edema or tenderness.  Neurological: She is alert and oriented to person, place, and time. She has normal strength. No cranial nerve deficit or sensory deficit. GCS eye subscore is 4. GCS verbal subscore is 5. GCS motor subscore is 6.  Skin: Skin is warm and dry. No abrasion and no rash noted.  Psychiatric: She has a normal mood and affect. Her speech is normal and behavior is normal.  Nursing note and vitals reviewed.    ED Treatments / Results  Labs (all labs ordered are listed, but only abnormal results are displayed) Labs Reviewed  CBG MONITORING, ED - Abnormal; Notable for the following:       Result Value   Glucose-Capillary 206 (*)    All other components within normal limits    EKG  EKG Interpretation None       Radiology No results found.  Procedures Procedures (including critical care time)  Medications Ordered in ED Medications - No data to display   Initial Impression / Assessment and Plan / ED Course  I have reviewed the triage vital signs and the nursing notes.  Pertinent labs & imaging results that were available during my care of the patient were reviewed by me and considered in my medical decision making (see chart for details).    Patient's blood sugar stable here and she was instructed to eat more frequent meals and a follow-up with her doctor next week for possible adjustment of her insulin regimen due  to her decreased oral intake  Final Clinical Impressions(s) / ED Diagnoses   Final diagnoses:  None    New Prescriptions New Prescriptions   No medications on file     Lacretia Leigh, MD 04/30/17 1424

## 2017-04-30 NOTE — ED Triage Notes (Addendum)
Pt arrives by [facility] for hypoglycemia friend found pt this am to be unresponsive called ems blood sugar was 45 on arrival they gave pt D10. pts CBG with ems 210. Pt is alert and ox4.

## 2017-05-04 ENCOUNTER — Ambulatory Visit (INDEPENDENT_AMBULATORY_CARE_PROVIDER_SITE_OTHER): Payer: Medicare Other | Admitting: Family Medicine

## 2017-05-04 ENCOUNTER — Encounter: Payer: Self-pay | Admitting: Family Medicine

## 2017-05-04 VITALS — BP 130/72 | HR 70 | Temp 97.8°F | Resp 16 | Ht 64.0 in | Wt 190.0 lb

## 2017-05-04 DIAGNOSIS — M5431 Sciatica, right side: Secondary | ICD-10-CM | POA: Diagnosis not present

## 2017-05-04 DIAGNOSIS — Z794 Long term (current) use of insulin: Secondary | ICD-10-CM

## 2017-05-04 DIAGNOSIS — E1165 Type 2 diabetes mellitus with hyperglycemia: Secondary | ICD-10-CM

## 2017-05-04 DIAGNOSIS — E1122 Type 2 diabetes mellitus with diabetic chronic kidney disease: Secondary | ICD-10-CM | POA: Diagnosis not present

## 2017-05-04 DIAGNOSIS — IMO0002 Reserved for concepts with insufficient information to code with codable children: Secondary | ICD-10-CM

## 2017-05-04 DIAGNOSIS — M51369 Other intervertebral disc degeneration, lumbar region without mention of lumbar back pain or lower extremity pain: Secondary | ICD-10-CM

## 2017-05-04 DIAGNOSIS — Z23 Encounter for immunization: Secondary | ICD-10-CM

## 2017-05-04 DIAGNOSIS — N182 Chronic kidney disease, stage 2 (mild): Secondary | ICD-10-CM | POA: Diagnosis not present

## 2017-05-04 DIAGNOSIS — M5136 Other intervertebral disc degeneration, lumbar region: Secondary | ICD-10-CM

## 2017-05-04 MED ORDER — INSULIN NPH ISOPHANE & REGULAR (70-30) 100 UNIT/ML ~~LOC~~ SUSP: SUBCUTANEOUS | 11 refills | Status: DC

## 2017-05-04 MED ORDER — PITAVASTATIN CALCIUM 4 MG PO TABS: 1.0000 | ORAL_TABLET | Freq: Every day | ORAL | 0 refills | Status: DC

## 2017-05-04 NOTE — Progress Notes (Signed)
Subjective:    Patient ID: Brenda Moreno, female    DOB: 01/24/1946, 71 y.o.   MRN: [id number]  HPI 03/27/17 Patient presents with one-week of severe pain beginning in her lower back on the right side radiating down her right leg into her right foot. It hurts to walk on her right leg. She reports burning and stinging pain in the right leg. Patient has a history of sciatica. In 2013, she had an MRI of the lumbar spine that revealed 2 right-sided bulging disc causing nerve impingement. At that time she had epidural steroid injections and the pain got better. This pain began spontaneously approximately 1 week ago. She denies any bowel or bladder incontinence. However she has been off her insulin now for quite some time. She states that her sugars are running in the 200s without any treatment.  At that time, my plan was: I explained to the patient that she cannot take prednisone until we get her sugars under better control. She will begin her insulin 70/30 as prescribed in May.  After 48 hours, once her sugars and hopefully fallen below 200, she can then begin the prednisone taper pack. Meanwhile begin Percocet 7.5/325 one by mouth every 6 hours when necessary pain. Recheck in one week if no better or sooner if worse  05/04/17 Due to cost, and the fact the patient was having to pay the majority of the cost, I'll plan was decided to start her on Novolin 70/3030 units in the morning and 15 units in the afternoons to manage her diabetes. The patient started the medication and did well for 3 weeks without difficulty. However over the last 2 weeks, her back pain has returned and the right-sided sciatica has returned. This, essentially left the patient bedbound. She was extremely sedentary. She was not eating very well. However she was still taking her medication. Over the weekend, she experienced 2 episodes of significant hypoglycemia related to taking her insulin and not eating. She is here today to follow this up.  She also reports uncontrollable diarrhea on metformin and reports urinary incontinence would like to discontinue her Lasix. Past Medical History:  Diagnosis Date  . Arthritis   . CAD (coronary artery disease)    S/P cabg  . Carotid artery occlusion   . CHF (congestive heart failure) ([place])   . DDD (degenerative disc disease), lumbar   . Diabetes mellitus   . Hyperlipidemia   . Hypertension   . Joint pain   . Leg pain   . Myocardial infarction ([place])   . Peripheral vascular disease ([place])   . PVD (peripheral vascular disease) ([place])    Past Surgical History:  Procedure Laterality Date  . APPENDECTOMY    . BREAST EXCISIONAL BIOPSY Left 2008  . CHOLECYSTECTOMY     Gall bladder  . CORONARY ARTERY BYPASS GRAFT     1998  . PR VEIN BYPASS GRAFT,AORTO-FEM-POP    . TUBAL LIGATION     Current Outpatient Prescriptions on File Prior to Visit  Medication Sig Dispense Refill  . albuterol (PROVENTIL HFA;VENTOLIN HFA) 108 (90 Base) MCG/ACT inhaler Inhale 2 puffs into the lungs every 6 (six) hours as needed for wheezing or shortness of breath. 1 Inhaler 2  . aspirin EC 81 MG tablet Take 81 mg by mouth daily.      . cetirizine (ZYRTEC) 10 MG tablet Take 1 tablet (10 mg total) by mouth daily. 30 tablet 11  . CINNAMON PO Take 1,000 mg by mouth 2 (  two) times daily.      . Coenzyme Q10 (CO Q 10 PO) Take 100 mg by mouth daily.      . fluticasone (FLONASE) 50 MCG/ACT nasal spray PLACE 2 SPRAYS INTO BOTH NOSTRILS DAILY. 16 g 3  . furosemide (LASIX) 40 MG tablet Take 1 tablet (40 mg total) by mouth daily. 90 tablet 3  . ibuprofen (ADVIL,MOTRIN) 800 MG tablet Take 1,600 mg by mouth every 8 (eight) hours. 6 days left    . Insulin Pen Needle 31G X 5 MM MISC Use with insulin pen DX E11.9 100 each 5  . Insulin Syringes, Disposable, U-100 1 ML MISC Use with insulin BID 100 each 5  . metFORMIN (GLUCOPHAGE) 1000 MG tablet TAKE 1 TABLET BY MOUTH  TWICE A DAY WITH MEALS 180 tablet 0  . metoprolol succinate  (TOPROL-XL) 100 MG 24 hr tablet Take 1 tablet by mouth  daily with or immediatley  following a meal (Patient taking differently: Take 100MG  by mouth  daily with or immediatley  following a meal) 90 tablet 4  . nitroGLYCERIN (NITROSTAT) 0.4 MG SL tablet Place 1 tablet (0.4 mg total) under the tongue every 5 (five) minutes as needed for chest pain. 90 tablet 2  . pioglitazone (ACTOS) 30 MG tablet TAKE 1 TABLET BY MOUTH  DAILY 90 tablet 0  . quinapril (ACCUPRIL) 40 MG tablet TAKE 1 TABLET BY MOUTH TWO  TIMES DAILY 180 tablet 0  . traMADol (ULTRAM) 50 MG tablet Take 1 tablet (50 mg total) by mouth every 8 (eight) hours as needed. 60 tablet 0  . oxyCODONE-acetaminophen (PERCOCET) 7.5-325 MG tablet Take 1 tablet by mouth every 4 (four) hours as needed for severe pain. (Patient not taking: Reported on 05/04/2017) 20 tablet 0  . potassium chloride (K-DUR) 10 MEQ tablet Take 1 tablet (10 mEq total) by mouth daily. 30 tablet 11   No current facility-administered medications on file prior to visit.    Allergies  Allergen Reactions  . Levaquin [Levofloxacin In D5w] Other (See Comments)    Pain all over and in joints  . Statins Other (See Comments)    Severe myalgias to lipitor, crestor, pravastatin and weakness and cramping  in bilateral leg.  [name] [Linagliptin] Itching   Social History   Social History  . Marital status: Divorced    Spouse name: N/A  . Number of children: N/A  . Years of education: N/A   Occupational History  . Not on file.   Social History Main Topics  . Smoking status: Former Smoker    Packs/day: 0.25    Types: Cigarettes    Quit date: 09/13/2011  . Smokeless tobacco: Never Used  . Alcohol use No  . Drug use: No  . Sexual activity: Not on file   Other Topics Concern  . Not on file   Social History Narrative  . No narrative on file     Review of Systems  All other systems reviewed and are negative.      Objective:   Physical Exam  Constitutional: She  appears well-developed and well-nourished.  Cardiovascular: Normal rate, regular rhythm and normal heart sounds.   Pulmonary/Chest: Effort normal and breath sounds normal.  Musculoskeletal:       Lumbar back: She exhibits decreased range of motion, tenderness and pain.       Back:  Neurological: She has normal reflexes. She exhibits normal muscle tone. Coordination normal.  Vitals reviewed.  Assessment & Plan:  Uncontrolled type 2 diabetes mellitus with stage 2 chronic kidney disease, with long-term current use of insulin (HCC)  Need for immunization against influenza - Plan: Flu Vaccine QUAD 36+ mos IM  DDD (degenerative disc disease), lumbar  Right sided sciatica  I explained to the patient that she has to eat if she takes her insulin. I will begin by reducing her insulin dose substantially. Decrease Novolin 70/30 to15 units in the morning and 5 units in the evening. Check fasting and two-hour postprandial sugars and report the values to me in one week. Titrate Novolin dose to achieve blood sugars in the range between 100-200.  Decrease metformin from 1000 mg by mouth twice a day to 500 mg by mouth twice a day in an effort to manage her diarrhea. Ultimately this will likely require a higher dose of insulin but we will adjust based on her reported sugar values in one week. Discontinue Lasix and discontinue potassium. She was only taking the medication due to swelling in her legs. At the present time the diuretic is causing more problems than benefit. Patient received her flu shot today. She is scheduled to see her orthopedist 9/7 to discuss her right-sided sciatica.  Ultimately, I would like to switch the patient back to Lantus away from 70/30 when financially she can afford to do so due to efficacy and safety.  At the present time, the patient is still in the doughnut hole.

## 2017-05-04 NOTE — Addendum Note (Signed)
Addended by: Asencion Partridge H on: 05/04/2017 05:00 PM   Modules accepted: Orders

## 2017-05-05 ENCOUNTER — Telehealth: Payer: Self-pay | Admitting: *Deleted

## 2017-05-05 DIAGNOSIS — M545 Low back pain: Secondary | ICD-10-CM | POA: Diagnosis not present

## 2017-05-05 MED ORDER — INSULIN NPH ISOPHANE & REGULAR (70-30) 100 UNIT/ML ~~LOC~~ SUSP: SUBCUTANEOUS | 11 refills | Status: DC

## 2017-05-05 NOTE — Telephone Encounter (Signed)
Received fax requesting alternative to Novolin 70/30. Advised that medication is not covered by insurance. Humulin 70/30 preferred. Changed medication to Humulin 70/30.

## 2017-05-11 ENCOUNTER — Ambulatory Visit: Payer: Medicare Other | Admitting: Family Medicine

## 2017-05-18 ENCOUNTER — Encounter: Payer: Self-pay | Admitting: Family Medicine

## 2017-05-18 DIAGNOSIS — M5416 Radiculopathy, lumbar region: Secondary | ICD-10-CM | POA: Diagnosis not present

## 2017-05-18 DIAGNOSIS — M545 Low back pain: Secondary | ICD-10-CM | POA: Diagnosis not present

## 2017-05-29 DIAGNOSIS — E162 Hypoglycemia, unspecified: Secondary | ICD-10-CM | POA: Diagnosis not present

## 2017-05-29 DIAGNOSIS — E161 Other hypoglycemia: Secondary | ICD-10-CM | POA: Diagnosis not present

## 2017-06-05 ENCOUNTER — Encounter: Payer: Self-pay | Admitting: Gastroenterology

## 2017-08-14 ENCOUNTER — Other Ambulatory Visit: Payer: Self-pay | Admitting: Pharmacist

## 2017-08-14 NOTE — Patient Outreach (Signed)
Incoming call from Marilu Favre in response to the [facility] Medication Adherence Campaign. Speak with patient. HIPAA identifiers verified and verbal consent received.  Ms. Holquin reports that she takes her quinapril twice daily as directed. Denies any missed doses or barriers to taking this medication. Patient denies any medication questions/concerns at this time.  Harlow Asa, PharmD, [facility] [phone]

## 2017-09-22 ENCOUNTER — Encounter: Payer: Self-pay | Admitting: Gastroenterology

## 2017-10-11 ENCOUNTER — Other Ambulatory Visit: Payer: Self-pay | Admitting: Family Medicine

## 2017-10-25 ENCOUNTER — Other Ambulatory Visit: Payer: Self-pay

## 2017-10-25 ENCOUNTER — Ambulatory Visit (AMBULATORY_SURGERY_CENTER): Payer: Self-pay | Admitting: *Deleted

## 2017-10-25 VITALS — Ht 66.0 in | Wt 185.0 lb

## 2017-10-25 DIAGNOSIS — Z1211 Encounter for screening for malignant neoplasm of colon: Secondary | ICD-10-CM

## 2017-10-25 MED ORDER — PEG 3350-KCL-NA BICARB-NACL 420 G PO SOLR: 4000.0000 mL | Freq: Once | ORAL | 0 refills | Status: AC

## 2017-10-25 NOTE — Progress Notes (Signed)
Patient denies any allergies to eggs or soy. Patient denies any problems with anesthesia/sedation. Patient denies any oxygen use at home. Patient denies taking any diet/weight loss medications or blood thinners. EMMI education assisgned to patient on colonoscopy, this was explained and instructions given to patient. 

## 2017-10-26 ENCOUNTER — Encounter: Payer: Self-pay | Admitting: Gastroenterology

## 2017-11-08 ENCOUNTER — Ambulatory Visit (AMBULATORY_SURGERY_CENTER): Payer: Medicare Other | Admitting: Gastroenterology

## 2017-11-08 ENCOUNTER — Encounter: Payer: Self-pay | Admitting: Gastroenterology

## 2017-11-08 VITALS — BP 150/74 | HR 62 | Temp 97.1°F | Resp 22 | Ht 64.0 in | Wt 190.0 lb

## 2017-11-08 DIAGNOSIS — D120 Benign neoplasm of cecum: Secondary | ICD-10-CM

## 2017-11-08 DIAGNOSIS — Z1211 Encounter for screening for malignant neoplasm of colon: Secondary | ICD-10-CM

## 2017-11-08 DIAGNOSIS — D122 Benign neoplasm of ascending colon: Secondary | ICD-10-CM | POA: Diagnosis not present

## 2017-11-08 DIAGNOSIS — D123 Benign neoplasm of transverse colon: Secondary | ICD-10-CM | POA: Diagnosis not present

## 2017-11-08 DIAGNOSIS — I10 Essential (primary) hypertension: Secondary | ICD-10-CM | POA: Diagnosis not present

## 2017-11-08 DIAGNOSIS — K5730 Diverticulosis of large intestine without perforation or abscess without bleeding: Secondary | ICD-10-CM | POA: Diagnosis not present

## 2017-11-08 MED ORDER — SODIUM CHLORIDE 0.9 % IV SOLN: 500.0000 mL | Freq: Once | INTRAVENOUS | Status: DC

## 2017-11-08 NOTE — Progress Notes (Signed)
Called to room to assist during endoscopic procedure.  Patient ID and intended procedure confirmed with present staff. Received instructions for my participation in the procedure from the performing physician.  

## 2017-11-08 NOTE — Progress Notes (Signed)
Pt's states no medical or surgical changes since previsit or office visit. 

## 2017-11-08 NOTE — Progress Notes (Signed)
Report to PACU, RN, vss, BBS= Clear.  

## 2017-11-08 NOTE — Patient Instructions (Signed)
Polyps information given  YOU HAD AN ENDOSCOPIC PROCEDURE TODAY AT [facility]:   Refer to the procedure report that was given to you for any specific questions about what was found during the examination.  If the procedure report does not answer your questions, please call your gastroenterologist to clarify.  If you requested that your care partner not be given the details of your procedure findings, then the procedure report has been included in a sealed envelope for you to review at your convenience later.  YOU SHOULD EXPECT: Some feelings of bloating in the abdomen. Passage of more gas than usual.  Walking can help get rid of the air that was put into your GI tract during the procedure and reduce the bloating. If you had a lower endoscopy (such as a colonoscopy or flexible sigmoidoscopy) you may notice spotting of blood in your stool or on the toilet paper. If you underwent a bowel prep for your procedure, you may not have a normal bowel movement for a few days.  Please Note:  You might notice some irritation and congestion in your nose or some drainage.  This is from the oxygen used during your procedure.  There is no need for concern and it should clear up in a day or so.  SYMPTOMS TO REPORT IMMEDIATELY:   Following lower endoscopy (colonoscopy or flexible sigmoidoscopy):  Excessive amounts of blood in the stool  Significant tenderness or worsening of abdominal pains  Swelling of the abdomen that is new, acute  Fever of 100F or higher   Black, tarry-looking stools  For urgent or emergent issues, a gastroenterologist can be reached at any hour by calling [phone].   DIET:  We do recommend a small meal at first, but then you may proceed to your regular diet.  Drink plenty of fluids but you should avoid alcoholic beverages for 24 hours.  ACTIVITY:  You should plan to take it easy for the rest of today and you should NOT DRIVE or use heavy machinery until tomorrow  (because of the sedation medicines used during the test).    FOLLOW UP: Our staff will call the number listed on your records the next business day following your procedure to check on you and address any questions or concerns that you may have regarding the information given to you following your procedure. If we do not reach you, we will leave a message.  However, if you are feeling well and you are not experiencing any problems, there is no need to return our call.  We will assume that you have returned to your regular daily activities without incident.  If any biopsies were taken you will be contacted by phone or by letter within the next 1-3 weeks.  Please call us at [phone] if you have not heard about the biopsies in 3 weeks.    SIGNATURES/CONFIDENTIALITY: You and/or your care partner have signed paperwork which will be entered into your electronic medical record.  These signatures attest to the fact that that the information above on your After Visit Summary has been reviewed and is understood.  Full responsibility of the confidentiality of this discharge information lies with you and/or your care-partner.

## 2017-11-08 NOTE — Op Note (Signed)
[facility] Patient Name: Brenda Moreno Procedure Date: 11/08/2017 8:21 AM MRN: [id number] Endoscopist: Milus Banister , MD Age: 72 Referring MD:  Date of Birth: 12-25-1945 Gender: Female Account #: [id number] Procedure:                Colonoscopy Indications:              Screening for colorectal malignant neoplasm;                            colonoscopy 2008 no polyps Medicines:                Monitored Anesthesia Care Procedure:                Pre-Anesthesia Assessment:                           - Prior to the procedure, a History and Physical                            was performed, and patient medications and                            allergies were reviewed. The patient's tolerance of                            previous anesthesia was also reviewed. The risks                            and benefits of the procedure and the sedation                            options and risks were discussed with the patient.                            All questions were answered, and informed consent                            was obtained. Prior Anticoagulants: The patient has                            taken no previous anticoagulant or antiplatelet                            agents. ASA Grade Assessment: II - A patient with                            mild systemic disease. After reviewing the risks                            and benefits, the patient was deemed in                            satisfactory condition to undergo the procedure.  After obtaining informed consent, the colonoscope                            was passed under direct vision. Throughout the                            procedure, the patient's blood pressure, pulse, and                            oxygen saturations were monitored continuously. The                            Colonoscope was introduced through the anus and                            advanced to the the cecum, identified by                             appendiceal orifice and ileocecal valve. The                            colonoscopy was performed without difficulty. The                            patient tolerated the procedure well. The quality                            of the bowel preparation was excellent. The                            ileocecal valve, appendiceal orifice, and rectum                            were photographed. Scope In: 8:23:06 AM Scope Out: 8:37:38 AM Scope Withdrawal Time: 0 hours 11 minutes 17 seconds  Total Procedure Duration: 0 hours 14 minutes 32 seconds  Findings:                 Five sessile polyps were found in the transverse                            colon, ascending colon and cecum. The polyps were 3                            to 7 mm in size. These polyps were removed with a                            cold snare. Resection and retrieval were complete.                           Multiple small-mouthed diverticula were found in                            the left colon.  The exam was otherwise without abnormality on                            direct and retroflexion views. Complications:            No immediate complications. Estimated blood loss:                            None. Estimated Blood Loss:     Estimated blood loss: none. Impression:               - Five 3 to 7 mm polyps in the transverse colon, in                            the ascending colon and in the cecum, removed with                            a cold snare. Resected and retrieved.                           - Diverticulosis in the left colon.                           - The examination was otherwise normal on direct                            and retroflexion views. Recommendation:           - Patient has a contact number available for                            emergencies. The signs and symptoms of potential                            delayed complications were discussed with  the                            patient. Return to normal activities tomorrow.                            Written discharge instructions were provided to the                            patient.                           - Resume previous diet.                           - Continue present medications.                           You will receive a letter within 2-3 weeks with the                            pathology results and my final recommendations.  If the polyp(s) is proven to be 'pre-cancerous' on                            pathology, you will need repeat colonoscopy in 3-5                            years. If the polyp(s) is NOT 'precancerous' on                            pathology then you should repeat colon cancer                            screening in 10 years with colonoscopy without need                            for colon cancer screening by any method prior to                            then (including stool testing). Milus Banister, MD 11/08/2017 8:40:32 AM This report has been signed electronically.

## 2017-11-09 ENCOUNTER — Telehealth: Payer: Self-pay | Admitting: *Deleted

## 2017-11-09 NOTE — Telephone Encounter (Signed)
  Follow up Call-  Call back number 11/08/2017  Post procedure Call Back phone  # [phone]  Permission to leave phone message Yes  Some recent data might be hidden     Patient questions:  Do you have a fever, pain , or abdominal swelling? No. Pain Score  0 *  Have you tolerated food without any problems? Yes.    Have you been able to return to your normal activities? Yes.    Do you have any questions about your discharge instructions: Diet   No. Medications  No. Follow up visit  No.  Do you have questions or concerns about your Care? No.  Actions: * If pain score is 4 or above: No action needed, pain <4.

## 2017-11-10 ENCOUNTER — Telehealth: Payer: Self-pay | Admitting: Family Medicine

## 2017-11-10 NOTE — Telephone Encounter (Signed)
Be glad to see her to discuss. 

## 2017-11-10 NOTE — Telephone Encounter (Signed)
House Calls nurse for [facility] called [facility] stating that she went to pt's house to do some testing on her and she had 4+ glucose in her urine and her A1C was 7.9.

## 2017-11-15 ENCOUNTER — Encounter: Payer: Self-pay | Admitting: Gastroenterology

## 2017-11-29 ENCOUNTER — Other Ambulatory Visit: Payer: Self-pay | Admitting: Family Medicine

## 2017-12-07 ENCOUNTER — Ambulatory Visit (INDEPENDENT_AMBULATORY_CARE_PROVIDER_SITE_OTHER)
Admission: RE | Admit: 2017-12-07 | Discharge: 2017-12-07 | Disposition: A | Discharge status: Home / Self Care | Payer: Medicare Other | Source: Ambulatory Visit | Attending: Family | Admitting: Family

## 2017-12-07 ENCOUNTER — Other Ambulatory Visit: Payer: Self-pay

## 2017-12-07 ENCOUNTER — Ambulatory Visit (INDEPENDENT_AMBULATORY_CARE_PROVIDER_SITE_OTHER): Payer: Medicare Other | Admitting: Family

## 2017-12-07 ENCOUNTER — Ambulatory Visit (HOSPITAL_COMMUNITY)
Admission: RE | Admit: 2017-12-07 | Discharge: 2017-12-07 | Disposition: A | Discharge status: Home / Self Care | Payer: Medicare Other | Source: Ambulatory Visit | Attending: Family | Admitting: Family

## 2017-12-07 ENCOUNTER — Encounter: Payer: Self-pay | Admitting: Family

## 2017-12-07 VITALS — BP 159/85 | HR 57 | Temp 97.3°F | Resp 20 | Ht 64.0 in | Wt 183.0 lb

## 2017-12-07 DIAGNOSIS — E1151 Type 2 diabetes mellitus with diabetic peripheral angiopathy without gangrene: Secondary | ICD-10-CM

## 2017-12-07 DIAGNOSIS — Z95828 Presence of other vascular implants and grafts: Secondary | ICD-10-CM

## 2017-12-07 DIAGNOSIS — Z87891 Personal history of nicotine dependence: Secondary | ICD-10-CM | POA: Diagnosis not present

## 2017-12-07 DIAGNOSIS — I779 Disorder of arteries and arterioles, unspecified: Secondary | ICD-10-CM | POA: Diagnosis not present

## 2017-12-07 DIAGNOSIS — I708 Atherosclerosis of other arteries: Secondary | ICD-10-CM | POA: Insufficient documentation

## 2017-12-07 NOTE — Progress Notes (Signed)
[facility] [place]   CC: Follow up peripheral artery occlusive disease   History of Present Illness Brenda Moreno is a 72 y.o. female who is status post right CIA stent in 2007 by Dr. Oneida Alar.  She had an MI in 1998, then 6 vessel CABG. Pt denies non-healing wounds.  Pt denies any hx of stroke or TIA.  Pt has what sounds like lumbar HNP, had ESI's which helped to a large degree, diagnosed with IBS, had vomiting, diarrhea; this is now under better control with probiotics and eating beans, lost about 20 pounds with this, but has gained most of it back since she is no longer having diarrhea.  She also states kidney problems, last serum creatinine result on file was 1.34 on 03-20-17.  She states she has been walking less to to claudication pain in legs; she has been walking in a pool of water to exercise her legs.      Diabetic: Yes, states in good control, states uncontrolled for a while, getting under better control, states last A1C was 7.?, last A1C result on file is 7.7 on 01-18-16. She is working with her PCP and insurance co to afford her DM medications. Tobacco use: former smoker, quit in November 2017, started at age 22.   Pt meds include: Statin :Yes, several statins caused legs weakness, is tolerating the current statin Betablocker: Yes ASA: Yes Other anticoagulants/antiplatelets: no    Past Medical History:  Diagnosis Date  . Arthritis   . CAD (coronary artery disease)    S/P cabg  . Carotid artery occlusion   . Cataract   . CHF (congestive heart failure) ([place])   . DDD (degenerative disc disease), lumbar   . Diabetes mellitus   . Hyperlipidemia   . Hypertension   . Joint pain   . Leg pain   . Myocardial infarction ([place]) 1998  . Peripheral vascular disease ([place])   . PVD (peripheral vascular disease) ([place])     Social History Social History   Tobacco Use  . Smoking status: Former Smoker    Packs/day: 0.25    Types: Cigarettes    Last  attempt to quit: 09/13/2011    Years since quitting: 6.2  . Smokeless tobacco: Never Used  Substance Use Topics  . Alcohol use: No  . Drug use: No    Family History Family History  Problem Relation Age of Onset  . Heart disease Mother   . Diabetes Mother   . Hyperlipidemia Mother   . Hypertension Mother   . Heart disease Father        Heart Disease before age 72  . Diabetes Father   . Hyperlipidemia Father   . Hypertension Father   . Heart disease Sister        heart attack  . Diabetes Sister        Amputation  . Hypertension Sister   . Other Sister        history of amputation  . Heart attack Sister   . Diabetes Brother   . Hypertension Brother   . Heart disease Brother 45       Before age 80  . Hyperlipidemia Brother   . Diabetes Brother        scirrosis of liver  . Coronary artery disease Other   . Colon cancer Neg Hx   . Stomach cancer Neg Hx   . Esophageal cancer Neg Hx     Past Surgical History:  Procedure Laterality  Date  . APPENDECTOMY    . BREAST EXCISIONAL BIOPSY Left 2008  . CHOLECYSTECTOMY     Gall bladder  . COLONOSCOPY  2008  . CORONARY ARTERY BYPASS GRAFT     1998  . PR VEIN BYPASS GRAFT,AORTO-FEM-POP    . TUBAL LIGATION      Allergies  Allergen Reactions  . Levaquin [Levofloxacin In D5w] Other (See Comments)    Pain all over and in joints  . Statins Other (See Comments)    Severe myalgias to lipitor, crestor, pravastatin and weakness and cramping  in bilateral leg.  [name] [Linagliptin] Itching    Current Outpatient Medications  Medication Sig Dispense Refill  . amLODipine (NORVASC) 5 MG tablet TAKE 1 TABLET BY MOUTH  DAILY 90 tablet 1  . aspirin EC 81 MG tablet Take 81 mg by mouth daily.      Marland Kitchen CINNAMON PO Take 1,000 mg by mouth 2 (two) times daily.      . Coenzyme Q10 (CO Q 10 PO) Take 100 mg by mouth daily.      . furosemide (LASIX) 40 MG tablet Take 1 tablet (40 mg total) by mouth daily. 90 tablet 3  . insulin NPH-regular  Human (HUMULIN 70/30) (70-30) 100 UNIT/ML injection Inject 15U Q AM and 5U Q HS (Patient taking differently: Inject 25 Units into the skin 2 (two) times daily with a meal. Inject 15U Q AM and 5U Q HS) 10 mL 11  . Insulin Pen Needle 31G X 5 MM MISC Use with insulin pen DX E11.9 100 each 5  . Insulin Syringes, Disposable, U-100 1 ML MISC Use with insulin BID 100 each 5  . metFORMIN (GLUCOPHAGE) 1000 MG tablet TAKE 1 TABLET BY MOUTH  TWICE A DAY WITH MEALS 180 tablet 0  . metoprolol succinate (TOPROL-XL) 100 MG 24 hr tablet Take 1 tablet by mouth  daily with or immediatley  following a meal (Patient taking differently: Take 100MG  by mouth  daily with or immediatley  following a meal) 90 tablet 4  . nitroGLYCERIN (NITROSTAT) 0.4 MG SL tablet Place 1 tablet (0.4 mg total) under the tongue every 5 (five) minutes as needed for chest pain. 90 tablet 2  . pioglitazone (ACTOS) 30 MG tablet TAKE 1 TABLET BY MOUTH  DAILY 90 tablet 0  . Pitavastatin Calcium (LIVALO) 4 MG TABS Take 1 tablet (4 mg total) by mouth daily. 90 tablet 0  . quinapril (ACCUPRIL) 40 MG tablet TAKE 1 TABLET BY MOUTH TWO  TIMES DAILY 180 tablet 0  . traMADol (ULTRAM) 50 MG tablet Take 1 tablet (50 mg total) by mouth every 8 (eight) hours as needed. 60 tablet 0  . potassium chloride (K-DUR) 10 MEQ tablet Take 1 tablet (10 mEq total) by mouth daily. 30 tablet 11   Current Facility-Administered Medications  Medication Dose Route Frequency Provider Last Rate Last Dose  . 0.9 %  sodium chloride infusion  500 mL Intravenous Once Milus Banister, MD        ROS: See HPI for pertinent positives and negatives.   Physical Examination  Vitals:   12/07/17 1046 12/07/17 1052  BP: (!) 157/85 (!) 159/85  Pulse: (!) 57   Resp: 20   Temp: (!) 97.3 F (36.3 C)   TempSrc: Oral   SpO2: 99%   Weight: 183 lb (83 kg)   Height: 5\' 4"  (1.626 m)    Body mass index is 31.41 kg/m.  General: A&O x 3, WDWN obese female. Gait:  normal HENT: No gross  abnormalities  Eyes: Pupils equal Pulmonary: Respirations are non labored, CTAB, without wheezes, rales, or rhonchi Cardiac: regular rythm, no detected murmur    Carotid Bruits Left Right   Negative Negative   Abdominal aortic pulse is not palpable Radial pulses: are palpable   VASCULAR EXAM: Extremitieswithout ischemic changes  without Gangrene; without open wounds.     LE Pulses LEFT RIGHT   FEMORAL 3+ palpable 2+ palpable    POPLITEAL not palpable  not palpable   POSTERIOR TIBIAL not palpable  not palpable    DORSALIS PEDIS  ANTERIOR TIBIAL 1+palpable  faintly palpable    Abdomen: soft, NT, no palpable masses. Skin: no rashes, no ulcers. Musculoskeletal: no muscle wasting or atrophy. Neurologic: A&O X 3; appropriate affect, Sensation is normal; MOTOR FUNCTION:  moving all extremities equally, motor strength 5/5 throughout. Speech is fluent/normal. CN 2-12 intact. Psychiatric: Thought content is normal, mood appropriate for clinical situation.    ASSESSMENT: QUINNE PIRES is a 72 y.o. female who is status post a right CIA stent in 2007 with what seems to be neurogenic claudication symptoms in her legs. Her atherosclerotic risk factors include almost in control  DM, 43 year hx of smoking (quit in November 2017), and CAD.  Her walking seems limited by her back issues since her ABI's improved to normal and mild disease.  Bilateral femoral pulses are 2+ and 3+ palpable.   I discussed with Dr. Oneida Alar elevated velocities in bilateral proximal CIA, see Plan.   DATA  Bilateral Iliac Artery Duplex (12/08/17): Right CIA: 322 cm/s at proximal stent, all biphasic waveforms Left CIA: 433 cm/s at proximal CIA, all biphasic  waveforms. Increased stenosis in the right CIA compared to the exam on 11-30-16. Left CIA not studied on 11-30-16.    ABI (Date: 2017-12-08):  R:   ABI: 1.00 (was 0.86 on 11-30-16),   PT: bi  DP: tri  TBI:  0.99 (was 0.86)  L:   ABI: 0.93 (was 0.74),   PT: waveform morphology not documented  DP: waveform morphology not documented  TBI: 0.55 (was 0.75)  Bilateral ABI improved, no significant disease in the right, mild disease in the left.  TBI improved in the right, declined in the left.      PLAN:  Graduated walking program discussed and how to achieve.   Based on the patient's vascular studies and examination, pt will return to clinic in 1 year for ABI's. No need to duplex iliac arteries unless ABI's decline signficantly and/or clinical picture indicates the need to evaluate iliac arteries.  I advised her to notify us if she develops concerns re the circulation in her feet/legs   I discussed in depth with the patient the nature of atherosclerosis, and emphasized the importance of maximal medical management including strict control of blood pressure, blood glucose, and lipid levels, obtaining regular exercise, and continued cessation of smoking.  The patient is aware that without maximal medical management the underlying atherosclerotic disease process will progress, limiting the benefit of any interventions.  The patient was given information about PAD including signs, symptoms, treatment, what symptoms should prompt the patient to seek immediate medical care, and risk reduction measures to take.  Clemon Chambers, RN, MSN, FNP-C [facility] Phone: [phone]  Clinic MD: [facility]  12-08-17 11:03 AM

## 2017-12-07 NOTE — Patient Instructions (Addendum)

## 2017-12-21 ENCOUNTER — Ambulatory Visit (HOSPITAL_COMMUNITY)
Admission: EM | Admit: 2017-12-21 | Discharge: 2017-12-21 | Disposition: A | Discharge status: Home / Self Care | Payer: Medicare Other | Source: Home / Self Care

## 2017-12-21 ENCOUNTER — Observation Stay (HOSPITAL_COMMUNITY)
Admission: EM | Admit: 2017-12-21 | Discharge: 2017-12-23 | Disposition: A | Discharge status: Home / Self Care | Payer: Medicare Other | Attending: Internal Medicine | Admitting: Internal Medicine

## 2017-12-21 ENCOUNTER — Encounter (HOSPITAL_COMMUNITY): Payer: Self-pay

## 2017-12-21 ENCOUNTER — Emergency Department (HOSPITAL_COMMUNITY): Payer: Medicare Other

## 2017-12-21 DIAGNOSIS — M5136 Other intervertebral disc degeneration, lumbar region: Secondary | ICD-10-CM | POA: Diagnosis not present

## 2017-12-21 DIAGNOSIS — E1122 Type 2 diabetes mellitus with diabetic chronic kidney disease: Secondary | ICD-10-CM | POA: Insufficient documentation

## 2017-12-21 DIAGNOSIS — E785 Hyperlipidemia, unspecified: Secondary | ICD-10-CM | POA: Diagnosis not present

## 2017-12-21 DIAGNOSIS — I130 Hypertensive heart and chronic kidney disease with heart failure and stage 1 through stage 4 chronic kidney disease, or unspecified chronic kidney disease: Secondary | ICD-10-CM | POA: Insufficient documentation

## 2017-12-21 DIAGNOSIS — I6529 Occlusion and stenosis of unspecified carotid artery: Secondary | ICD-10-CM | POA: Diagnosis not present

## 2017-12-21 DIAGNOSIS — R0789 Other chest pain: Secondary | ICD-10-CM | POA: Insufficient documentation

## 2017-12-21 DIAGNOSIS — R079 Chest pain, unspecified: Secondary | ICD-10-CM | POA: Diagnosis present

## 2017-12-21 DIAGNOSIS — R1013 Epigastric pain: Secondary | ICD-10-CM | POA: Diagnosis not present

## 2017-12-21 DIAGNOSIS — E118 Type 2 diabetes mellitus with unspecified complications: Secondary | ICD-10-CM | POA: Diagnosis not present

## 2017-12-21 DIAGNOSIS — Z8249 Family history of ischemic heart disease and other diseases of the circulatory system: Secondary | ICD-10-CM | POA: Diagnosis not present

## 2017-12-21 DIAGNOSIS — I25810 Atherosclerosis of coronary artery bypass graft(s) without angina pectoris: Secondary | ICD-10-CM | POA: Diagnosis not present

## 2017-12-21 DIAGNOSIS — N179 Acute kidney failure, unspecified: Secondary | ICD-10-CM | POA: Diagnosis not present

## 2017-12-21 DIAGNOSIS — M1990 Unspecified osteoarthritis, unspecified site: Secondary | ICD-10-CM | POA: Insufficient documentation

## 2017-12-21 DIAGNOSIS — I10 Essential (primary) hypertension: Secondary | ICD-10-CM | POA: Diagnosis present

## 2017-12-21 DIAGNOSIS — I252 Old myocardial infarction: Secondary | ICD-10-CM | POA: Insufficient documentation

## 2017-12-21 DIAGNOSIS — N183 Chronic kidney disease, stage 3 (moderate): Secondary | ICD-10-CM | POA: Diagnosis not present

## 2017-12-21 DIAGNOSIS — Z794 Long term (current) use of insulin: Secondary | ICD-10-CM | POA: Insufficient documentation

## 2017-12-21 DIAGNOSIS — Z87891 Personal history of nicotine dependence: Secondary | ICD-10-CM | POA: Insufficient documentation

## 2017-12-21 DIAGNOSIS — E1151 Type 2 diabetes mellitus with diabetic peripheral angiopathy without gangrene: Secondary | ICD-10-CM | POA: Insufficient documentation

## 2017-12-21 DIAGNOSIS — I5032 Chronic diastolic (congestive) heart failure: Secondary | ICD-10-CM | POA: Diagnosis not present

## 2017-12-21 DIAGNOSIS — I2510 Atherosclerotic heart disease of native coronary artery without angina pectoris: Secondary | ICD-10-CM | POA: Diagnosis not present

## 2017-12-21 DIAGNOSIS — I2582 Chronic total occlusion of coronary artery: Secondary | ICD-10-CM | POA: Insufficient documentation

## 2017-12-21 DIAGNOSIS — I2584 Coronary atherosclerosis due to calcified coronary lesion: Secondary | ICD-10-CM | POA: Insufficient documentation

## 2017-12-21 DIAGNOSIS — E119 Type 2 diabetes mellitus without complications: Secondary | ICD-10-CM

## 2017-12-21 LAB — CBC WITH DIFFERENTIAL/PLATELET
BASOS ABS: 0 10*3/uL (ref 0.0–0.1)
BASOS PCT: 0 %
EOS PCT: 1 %
Eosinophils Absolute: 0.1 10*3/uL (ref 0.0–0.7)
HCT: 37.4 % (ref 36.0–46.0)
Hemoglobin: 12.8 g/dL (ref 12.0–15.0)
Lymphocytes Relative: 21 %
Lymphs Abs: 1.8 10*3/uL (ref 0.7–4.0)
MCH: 27.4 pg (ref 26.0–34.0)
MCHC: 34.2 g/dL (ref 30.0–36.0)
MCV: 79.9 fL (ref 78.0–100.0)
MONO ABS: 0.4 10*3/uL (ref 0.1–1.0)
Monocytes Relative: 5 %
Neutro Abs: 6.1 10*3/uL (ref 1.7–7.7)
Neutrophils Relative %: 73 %
Platelets: 219 10*3/uL (ref 150–400)
RBC: 4.68 MIL/uL (ref 3.87–5.11)
RDW: 17.3 % — AB (ref 11.5–15.5)
WBC: 8.4 10*3/uL (ref 4.0–10.5)

## 2017-12-21 LAB — I-STAT TROPONIN, ED
TROPONIN I, POC: 0 ng/mL (ref 0.00–0.08)
Troponin i, poc: 0 ng/mL (ref 0.00–0.08)

## 2017-12-21 LAB — COMPREHENSIVE METABOLIC PANEL
ALBUMIN: 3.4 g/dL — AB (ref 3.5–5.0)
ALT: 15 U/L (ref 14–54)
ANION GAP: 12 (ref 5–15)
AST: 21 U/L (ref 15–41)
Alkaline Phosphatase: 80 U/L (ref 38–126)
BILIRUBIN TOTAL: 0.6 mg/dL (ref 0.3–1.2)
BUN: 27 mg/dL — AB (ref 6–20)
CO2: 21 mmol/L — ABNORMAL LOW (ref 22–32)
Calcium: 9.3 mg/dL (ref 8.9–10.3)
Chloride: 105 mmol/L (ref 101–111)
Creatinine, Ser: 1.54 mg/dL — ABNORMAL HIGH (ref 0.44–1.00)
GFR calc Af Amer: 39 mL/min — ABNORMAL LOW (ref >60)
GFR, EST NON AFRICAN AMERICAN: 33 mL/min — AB (ref >60)
GLUCOSE: 238 mg/dL — AB (ref 65–99)
POTASSIUM: 4.1 mmol/L (ref 3.5–5.1)
Sodium: 138 mmol/L (ref 135–145)
TOTAL PROTEIN: 6.4 g/dL — AB (ref 6.5–8.1)

## 2017-12-21 LAB — LIPASE, BLOOD: LIPASE: 36 U/L (ref 11–51)

## 2017-12-21 MED ORDER — NITROGLYCERIN 0.4 MG SL SUBL
0.4000 mg | SUBLINGUAL_TABLET | Freq: Once | SUBLINGUAL | Status: AC
  Administered 2017-12-21: 0.4 mg via SUBLINGUAL
  Filled 2017-12-21: qty 1

## 2017-12-21 MED ORDER — ASPIRIN 81 MG PO CHEW
324.0000 mg | CHEWABLE_TABLET | Freq: Once | ORAL | Status: AC
  Administered 2017-12-21: 324 mg via ORAL
  Filled 2017-12-21: qty 4

## 2017-12-21 MED ORDER — SODIUM CHLORIDE 0.9 % IV SOLN: Freq: Once | INTRAVENOUS | Status: DC

## 2017-12-21 MED ORDER — GI COCKTAIL ~~LOC~~
30.0000 mL | Freq: Once | ORAL | Status: AC
  Administered 2017-12-21: 30 mL via ORAL
  Filled 2017-12-21: qty 30

## 2017-12-21 MED ORDER — ONDANSETRON 4 MG PO TBDP
4.0000 mg | ORAL_TABLET | Freq: Once | ORAL | Status: AC
  Administered 2017-12-21: 4 mg via ORAL
  Filled 2017-12-21: qty 1

## 2017-12-21 NOTE — ED Notes (Signed)
Spoke to patient about wait time. Pt verbalizes understanding.

## 2017-12-21 NOTE — ED Notes (Signed)
Pt continues to c/o chest pain, describes as pressure.  Pt rates pain a #3 on pain scale 0/10

## 2017-12-21 NOTE — ED Provider Notes (Addendum)
[facility] EMERGENCY DEPARTMENT Provider Note   CSN: [id number] Arrival date & time: 12/21/17  1416     History   Chief Complaint Chief Complaint  Patient presents with  . Chest Pain    HPI Brenda Moreno is a 72 y.o. female.   Pt is a 72 y.o. y/o female  with a PMHx of CAD/MI s/p CABG, PVD, CHF, DM2, HLD, and HTN, and a PSHx of cholecystectomy who presents to the emergency department for evaluation of epigastric, lower central chest pressure.  Symptoms initially began around 10 AM in the morning.  She reports associated diaphoresis as well as nausea with one episode of nonbloody, nonbilious emesis.  Her pain improved shortly after this.  She did drink some ginger ale which provided subjective improvement.  Her pain largely subsided until the afternoon.  She states that she noticed increased pressure in the same area when ambulating in the mall.  Pain slightly improved with rest.  Chest pain continued to worsen after having a hamburger with onions on it.  Patient, again, had another episode of diaphoresis, nausea, vomiting.  She does have a history of irritable bowel syndrome, but denies any past history of nausea or vomiting with this.  She states that her pain has been nonradiating, but will feel as though her bra is too tight.  She had similar complaints with her prior MI which she initially also attributed to indigestion.  No recent fevers, shortness of breath, hematemesis.  She has been off of her aspirin for approximately 1 month, but compliant with all of her other medications.     Past Medical History:  Diagnosis Date  . Arthritis   . CAD (coronary artery disease)    S/P cabg  . Carotid artery occlusion   . Cataract   . CHF (congestive heart failure) ([place])   . DDD (degenerative disc disease), lumbar   . Diabetes mellitus   . Hyperlipidemia   . Hypertension   . Joint pain   . Leg pain   . Myocardial infarction ([place]) 1998  . Peripheral vascular disease  ([place])   . PVD (peripheral vascular disease) [facility])     Patient Active Problem List   Diagnosis Date Noted  . Coronary artery disease involving native coronary artery of native heart without angina pectoris 06/02/2016  . DDD (degenerative disc disease), lumbar   . Diarrhea 07/02/2014  . Fecal incontinence 07/02/2014  . Aftercare following surgery of the circulatory system, [place] 09/26/2013  . 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 Acute sinus infection 07/14/2013  . Arthritis   . CHF (congestive heart failure) ([place])   . Diabetes mellitus, type II, insulin dependent ([place])   . Hyperlipidemia   . Hypertension   . Myocardial infarction ([place])   . PVD (peripheral vascular disease) ([place])   . Peripheral vascular disease, unspecified ([place]) 09/27/2012  . Leg cramps 09/27/2012  . Atherosclerosis of native arteries of the extremities with intermittent claudication 09/22/2011  . DIABETES MELLITUS 11/22/2007  . HYPERCHOLESTEROLEMIA 11/22/2007  . Benign essential HTN 11/22/2007  . HEART ATTACK 11/22/2007  . CAD 11/22/2007  . RENAL CALCULUS 11/22/2007    Past Surgical History:  Procedure Laterality Date  . APPENDECTOMY    . BREAST EXCISIONAL BIOPSY Left 2008  . CHOLECYSTECTOMY     Gall bladder  . COLONOSCOPY  2008  . CORONARY ARTERY BYPASS GRAFT     1998  . PR VEIN BYPASS GRAFT,AORTO-FEM-POP    . TUBAL  LIGATION       OB History   None      Home Medications    Prior to Admission medications   Medication Sig Start Date End Date Taking? Authorizing Provider  amLODipine (NORVASC) 5 MG tablet TAKE 1 TABLET BY MOUTH  DAILY 11/29/17  Yes Pickard, Cammie Mcgee, MD  Bioflavonoid Products (ESTER C PO) Take 1,000 mg by mouth daily.   Yes [provider]  CINNAMON PO Take 1,000 mg by mouth 2 (two) times daily.     Yes [provider]  Coenzyme Q10 (CO Q 10 PO) Take 100 mg by mouth daily.     Yes [provider]  furosemide (LASIX) 40 MG tablet  Take 1 tablet (40 mg total) by mouth daily. Patient taking differently: Take 20 mg by mouth daily.  10/13/16  Yes Susy Frizzle, MD  insulin NPH-regular Human (HUMULIN 70/30) (70-30) 100 UNIT/ML injection Inject 15U Q AM and 5U Q HS Patient taking differently: Inject 25 Units into the skin 2 (two) times daily with a meal. Inject 15U Q AM and 5U Q HS 05/05/17  Yes Susy Frizzle, MD  metFORMIN (GLUCOPHAGE) 1000 MG tablet TAKE 1 TABLET BY MOUTH  TWICE A DAY WITH MEALS 11/29/17  Yes Susy Frizzle, MD  metoprolol succinate (TOPROL-XL) 100 MG 24 hr tablet Take 1 tablet by mouth  daily with or immediatley  following a meal Patient taking differently: Take 100MG  by mouth  daily with or immediatley  following a meal 11/30/15  Yes Susy Frizzle, MD  nitroGLYCERIN (NITROSTAT) 0.4 MG SL tablet Place 1 tablet (0.4 mg total) under the tongue every 5 (five) minutes as needed for chest pain. 11/12/14  Yes Susy Frizzle, MD  pioglitazone (ACTOS) 30 MG tablet TAKE 1 TABLET BY MOUTH  DAILY Patient taking differently: TAKE 1 TABLET BY MOUTH  in the evenng 11/29/17  Yes Pickard, Cammie Mcgee, MD  Pitavastatin Calcium (LIVALO) 4 MG TABS Take 1 tablet (4 mg total) by mouth daily. Patient taking differently: Take 1 tablet by mouth at bedtime.  05/04/17  Yes Susy Frizzle, MD  potassium chloride (K-DUR) 10 MEQ tablet Take 1 tablet (10 mEq total) by mouth daily. 06/02/16 12/21/17 Yes Nahser, Wonda Cheng, MD  quinapril (ACCUPRIL) 40 MG tablet TAKE 1 TABLET BY MOUTH TWO  TIMES DAILY 11/29/17  Yes Susy Frizzle, MD  traMADol (ULTRAM) 50 MG tablet Take 1 tablet (50 mg total) by mouth every 8 (eight) hours as needed. 07/08/16  Yes Orlena Sheldon, PA-C    Family History Family History  Problem Relation Age of Onset  . Heart disease Mother   . Diabetes Mother   . Hyperlipidemia Mother   . Hypertension Mother   . Heart disease Father        Heart Disease before age 55  . Diabetes Father   . Hyperlipidemia Father   .  Hypertension Father   . Heart disease Sister        heart attack  . Diabetes Sister        Amputation  . Hypertension Sister   . Other Sister        history of amputation  . Heart attack Sister   . Diabetes Brother   . Hypertension Brother   . Heart disease Brother 65       Before age 32  . Hyperlipidemia Brother   . Diabetes Brother        scirrosis of liver  .  Coronary artery disease Other   . Colon cancer Neg Hx   . Stomach cancer Neg Hx   . Esophageal cancer Neg Hx     Social History Social History   Tobacco Use  . Smoking status: Former Smoker    Packs/day: 0.25    Types: Cigarettes    Last attempt to quit: 09/13/2011    Years since quitting: 6.2  . Smokeless tobacco: Never Used  Substance Use Topics  . Alcohol use: No  . Drug use: No     Allergies   Levaquin [levofloxacin in d5w]; Statins; and Tradjenta [linagliptin]   Review of Systems Review of Systems Ten systems reviewed and are negative for acute change, except as noted in the HPI.    Physical Exam Updated Vital Signs BP 122/76   Pulse (!) 59   Temp 98.3 F (36.8 C) (Oral)   Resp 18   Ht 5\' 6"  (1.676 m)   Wt 82.6 kg (182 lb)   SpO2 96%   BMI 29.38 kg/m   Physical Exam  Constitutional: She is oriented to person, place, and time. She appears well-developed and well-nourished. No distress.  Nontoxic appearing and pleasant.  HENT:  Head: Normocephalic and atraumatic.  Eyes: Conjunctivae and EOM are normal. No scleral icterus.  Neck: Normal range of motion.  Cardiovascular: Normal rate, regular rhythm and intact distal pulses.  Pulmonary/Chest: Effort normal. No stridor. No respiratory distress. She has no wheezes. She has no rales.  Lungs clear to auscultation bilaterally.  Well-healed midline sternotomy scar.  Musculoskeletal: Normal range of motion.  Neurological: She is alert and oriented to person, place, and time. She exhibits normal muscle tone. Coordination normal.  GCS 15. Patient  moving all extremities.  Skin: Skin is warm and dry. No rash noted. She is not diaphoretic. No erythema. No pallor.  Psychiatric: She has a normal mood and affect. Her behavior is normal.  Nursing note and vitals reviewed.    ED Treatments / Results  Labs (all labs ordered are listed, but only abnormal results are displayed) Labs Reviewed  CBC WITH DIFFERENTIAL/PLATELET - Abnormal; Notable for the following components:      Result Value   RDW 17.3 (*)    All other components within normal limits  COMPREHENSIVE METABOLIC PANEL - Abnormal; Notable for the following components:   CO2 21 (*)    Glucose, Bld 238 (*)    BUN 27 (*)    Creatinine, Ser 1.54 (*)    Total Protein 6.4 (*)    Albumin 3.4 (*)    GFR calc non Af Amer 33 (*)    GFR calc Af Amer 39 (*)    All other components within normal limits  LIPASE, BLOOD  I-STAT TROPONIN, ED  I-STAT TROPONIN, ED    EKG EKG Interpretation  Date/Time:  Thursday December 21 2017 14:22:41 EDT Ventricular Rate:  69 PR Interval:  144 QRS Duration: 74 QT Interval:  382 QTC Calculation: 409 R Axis:   45 Text Interpretation:  Normal sinus rhythm Nonspecific T wave abnormality Abnormal ECG No significant change since last tracing Confirmed by Merrily Pew [phone]) on 12/21/2017 10:52:15 PM   Radiology Dg Chest 2 View  Result Date: 12/21/2017 CLINICAL DATA:  Chest pain. EXAM: CHEST - 2 VIEW COMPARISON:  Radiographs of October 20, 2016. FINDINGS: The heart size and mediastinal contours are within normal limits. Both lungs are clear. Status post coronary artery bypass graft. No pneumothorax or pleural effusion is noted. The visualized skeletal structures  are unremarkable. IMPRESSION: No active cardiopulmonary disease. Electronically Signed   By: Marijo Conception, M.D.   On: 12/21/2017 15:26    Procedures Procedures (including critical care time)  Medications Ordered in ED Medications  0.9 %  sodium chloride infusion (has no administration  in time range)  ondansetron (ZOFRAN-ODT) disintegrating tablet 4 mg (4 mg Oral Given 12/21/17 1439)  gi cocktail (Maalox,Lidocaine,Donnatal) (30 mLs Oral Given 12/21/17 1439)  aspirin chewable tablet 324 mg (324 mg Oral Given 12/21/17 1439)  nitroGLYCERIN (NITROSTAT) SL tablet 0.4 mg (0.4 mg Sublingual Given 12/21/17 2352)    10:44 PM Even with moderate suspicion, patient with heart score of 5.  Certain aspects of her story are concerning including pressure-like pain which is aggravated with exertion as well as associated nausea, vomiting, diaphoresis.  She notes the pressure to feel as though it is making her bra tight.  The patient reports the same feeling with her prior heart attacks.  She is unaware of the date of her last stress test.  Cards - Dr. Acie Fredrickson.   Initial Impression / Assessment and Plan / ED Course  I have reviewed the triage vital signs and the nursing notes.  Pertinent labs & imaging results that were available during my care of the patient were reviewed by me and considered in my medical decision making (see chart for details).     Patient presenting to the emergency department for evaluation of chest pain which she describes as a pressure.  Symptoms concerning for possible cardiac etiology.  Heart score is 5.  Will admit to [facility] for further evaluation, likely stress test in the morning.  Case discussed with Dr. Tamala Julian who will admit.  12:42 AM Patient c/o 3-4/10 chest pain. Mild HTN. Given 1 tablet of 0.4 SL NTG with complete resolution of her chest pain.  Vitals:   12/21/17 2330 12/21/17 2345 12/22/17 0000 12/22/17 0015  BP: (!) 109/48 (!) 137/59 122/76 134/87  Pulse: (!) 49 68 (!) 59 (!) 51  Resp: 17 20 18 15   Temp:      TempSrc:      SpO2: 95% 93% 96% 99%  Weight:      Height:        Final Clinical Impressions(s) / ED Diagnoses   Final diagnoses:  Nonspecific chest pain    ED Discharge Orders    None       Antonietta Breach, PA-C 12/22/17 0020    Antonietta Breach, PA-C 12/22/17 3474    Merrily Pew, MD 12/25/17 2138

## 2017-12-21 NOTE — ED Notes (Signed)
ED Provider at bedside. 

## 2017-12-21 NOTE — ED Notes (Signed)
Rounded on Patient to reassess, explain wait time and thank patient for her patience, Patient very sweet and kind. Becoming very fatigued by wait.

## 2017-12-21 NOTE — ED Triage Notes (Signed)
Pt presents with central chest pain starting this AM. Pt reports N/V. Denies sob.

## 2017-12-21 NOTE — ED Provider Notes (Signed)
Patient placed in Quick Look pathway, seen and evaluated   Chief Complaint: CP onset at 10am  HPI:   Pt is a 72 y.o. y/o female  with a PMHx of CAD/MI s/p CABG, PVD, CHF, DM2, HLD, and HTN, and a PSHx of cholecystectomy, presenting today with c/o epigastric/central pressure-like CP that began around 10 AM.  She had one episode of nonbloody nonbilious emesis with associated nausea, and afterwards the pain improved.  Subsequently she had a hamburger with onions on it and shortly after her chest pain returned, so she tried drinking ginger ale with some relief.  She reports that the second time she had chest pain she also had some diaphoresis and felt slightly lightheaded.  She denies any shortness of breath or any other complaints.  ROS: +CP, +diaphoresis, +lightheadedness, +n/v, NO SOB or hematemesis  Physical Exam:  BP 124/80 (BP Location: Right Arm)   Pulse 75   Temp 98.1 F (36.7 C) (Oral)   Resp 16   Ht 5\' 6"  (1.676 m)   Wt 82.6 kg (182 lb)   SpO2 99%   BMI 29.38 kg/m    Gen: No distress  Neuro: Awake and Alert  Skin: Warm    Focused Exam: cardio: RRR, nl s1/s2, no m/r/g; lungs clear; abdomen soft, nondistended, +BS throughout, with moderate epigastric TTP, no r/g/r, neg murphy's, neg mcburney's. No chest wall TTP, just epigastric TTP.    Initiation of care has begun. The patient has been counseled on the process, plan, and necessity for staying for the completion/evaluation, and the remainder of the medical screening examination     [address] [place], [place] 12/21/17 1432    Macarthur Critchley, MD 12/27/17 1444

## 2017-12-22 ENCOUNTER — Encounter (HOSPITAL_COMMUNITY)
Admission: EM | Disposition: A | Discharge status: Home / Self Care | Payer: Self-pay | Source: Home / Self Care | Attending: Emergency Medicine

## 2017-12-22 ENCOUNTER — Observation Stay (HOSPITAL_COMMUNITY): Payer: Medicare Other

## 2017-12-22 DIAGNOSIS — R072 Precordial pain: Secondary | ICD-10-CM

## 2017-12-22 DIAGNOSIS — I2510 Atherosclerotic heart disease of native coronary artery without angina pectoris: Secondary | ICD-10-CM | POA: Diagnosis not present

## 2017-12-22 DIAGNOSIS — R079 Chest pain, unspecified: Secondary | ICD-10-CM | POA: Diagnosis present

## 2017-12-22 HISTORY — PX: LEFT HEART CATH AND CORS/GRAFTS ANGIOGRAPHY: CATH118250

## 2017-12-22 LAB — CBC WITH DIFFERENTIAL/PLATELET
BASOS ABS: 0 10*3/uL (ref 0.0–0.1)
Basophils Relative: 0 %
Eosinophils Absolute: 0.2 10*3/uL (ref 0.0–0.7)
Eosinophils Relative: 2 %
HEMATOCRIT: 34.9 % — AB (ref 36.0–46.0)
HEMOGLOBIN: 11.9 g/dL — AB (ref 12.0–15.0)
LYMPHS PCT: 47 %
Lymphs Abs: 3.6 10*3/uL (ref 0.7–4.0)
MCH: 27.1 pg (ref 26.0–34.0)
MCHC: 34.1 g/dL (ref 30.0–36.0)
MCV: 79.5 fL (ref 78.0–100.0)
Monocytes Absolute: 0.6 10*3/uL (ref 0.1–1.0)
Monocytes Relative: 7 %
NEUTROS ABS: 3.4 10*3/uL (ref 1.7–7.7)
NEUTROS PCT: 44 %
Platelets: 207 10*3/uL (ref 150–400)
RBC: 4.39 MIL/uL (ref 3.87–5.11)
RDW: 16.6 % — ABNORMAL HIGH (ref 11.5–15.5)
WBC: 7.7 10*3/uL (ref 4.0–10.5)

## 2017-12-22 LAB — BASIC METABOLIC PANEL
ANION GAP: 10 (ref 5–15)
BUN: 24 mg/dL — ABNORMAL HIGH (ref 6–20)
CHLORIDE: 108 mmol/L (ref 101–111)
CO2: 21 mmol/L — AB (ref 22–32)
Calcium: 8.8 mg/dL — ABNORMAL LOW (ref 8.9–10.3)
Creatinine, Ser: 1.29 mg/dL — ABNORMAL HIGH (ref 0.44–1.00)
GFR calc Af Amer: 48 mL/min — ABNORMAL LOW (ref >60)
GFR calc non Af Amer: 41 mL/min — ABNORMAL LOW (ref >60)
GLUCOSE: 134 mg/dL — AB (ref 65–99)
POTASSIUM: 3.5 mmol/L (ref 3.5–5.1)
Sodium: 139 mmol/L (ref 135–145)

## 2017-12-22 LAB — HEMOGLOBIN A1C
Hgb A1c MFr Bld: 9.9 % — ABNORMAL HIGH (ref 4.8–5.6)
MEAN PLASMA GLUCOSE: 237.43 mg/dL

## 2017-12-22 LAB — TROPONIN I
Troponin I: <0.03 ng/mL (ref <0.03)
Troponin I: <0.03 ng/mL (ref <0.03)

## 2017-12-22 LAB — LIPID PANEL
Cholesterol: 244 mg/dL — ABNORMAL HIGH (ref 0–200)
HDL: 41 mg/dL (ref >40)
LDL Cholesterol: 167 mg/dL — ABNORMAL HIGH (ref 0–99)
Total CHOL/HDL Ratio: 6 RATIO
Triglycerides: 182 mg/dL — ABNORMAL HIGH (ref <150)
VLDL: 36 mg/dL (ref 0–40)

## 2017-12-22 LAB — GLUCOSE, CAPILLARY
Glucose-Capillary: 198 mg/dL — ABNORMAL HIGH (ref 65–99)
Glucose-Capillary: 414 mg/dL — ABNORMAL HIGH (ref 65–99)
Glucose-Capillary: 145 mg/dL — ABNORMAL HIGH (ref 65–99)

## 2017-12-22 LAB — CBG MONITORING, ED
Glucose-Capillary: 167 mg/dL — ABNORMAL HIGH (ref 65–99)
Glucose-Capillary: 282 mg/dL — ABNORMAL HIGH (ref 65–99)

## 2017-12-22 SURGERY — LEFT HEART CATH AND CORS/GRAFTS ANGIOGRAPHY
Anesthesia: LOCAL

## 2017-12-22 MED ORDER — SODIUM CHLORIDE 0.9 % WEIGHT BASED INFUSION: 1.0000 mL/kg/h | INTRAVENOUS | Status: DC

## 2017-12-22 MED ORDER — HEPARIN (PORCINE) IN NACL 2-0.9 UNIT/ML-% IJ SOLN
INTRAMUSCULAR | Status: DC | PRN
  Administered 2017-12-22: 10 mL via INTRA_ARTERIAL

## 2017-12-22 MED ORDER — ACETAMINOPHEN 325 MG PO TABS: 650.0000 mg | ORAL_TABLET | ORAL | Status: DC | PRN

## 2017-12-22 MED ORDER — METOPROLOL SUCCINATE ER 100 MG PO TB24
100.0000 mg | ORAL_TABLET | Freq: Every day | ORAL | Status: DC
  Administered 2017-12-22 – 2017-12-23 (×2): 100 mg via ORAL
  Filled 2017-12-22 (×2): qty 1

## 2017-12-22 MED ORDER — SODIUM CHLORIDE 0.9% FLUSH: 3.0000 mL | INTRAVENOUS | Status: DC | PRN

## 2017-12-22 MED ORDER — INSULIN ASPART 100 UNIT/ML ~~LOC~~ SOLN
0.0000 [IU] | Freq: Three times a day (TID) | SUBCUTANEOUS | Status: DC
  Administered 2017-12-23: 5 [IU] via SUBCUTANEOUS

## 2017-12-22 MED ORDER — INSULIN ASPART 100 UNIT/ML ~~LOC~~ SOLN
0.0000 [IU] | Freq: Three times a day (TID) | SUBCUTANEOUS | Status: DC
  Administered 2017-12-22: 15 [IU] via SUBCUTANEOUS

## 2017-12-22 MED ORDER — PRAVASTATIN SODIUM 40 MG PO TABS
80.0000 mg | ORAL_TABLET | Freq: Every day | ORAL | Status: DC
  Administered 2017-12-22: 80 mg via ORAL
  Filled 2017-12-22: qty 2

## 2017-12-22 MED ORDER — SODIUM CHLORIDE 0.9 % WEIGHT BASED INFUSION
3.0000 mL/kg/h | INTRAVENOUS | Status: DC
  Administered 2017-12-22: 3 mL/kg/h via INTRAVENOUS

## 2017-12-22 MED ORDER — FENTANYL CITRATE (PF) 100 MCG/2ML IJ SOLN
INTRAMUSCULAR | Status: DC | PRN
  Administered 2017-12-22 (×2): 25 ug via INTRAVENOUS

## 2017-12-22 MED ORDER — LIDOCAINE HCL (PF) 1 % IJ SOLN
INTRAMUSCULAR | Status: DC | PRN
  Administered 2017-12-22: 2 mL via INTRADERMAL

## 2017-12-22 MED ORDER — HEPARIN (PORCINE) IN NACL 2-0.9 UNITS/ML
INTRAMUSCULAR | Status: AC | PRN
  Administered 2017-12-22 (×2): 500 mL

## 2017-12-22 MED ORDER — INSULIN ASPART PROT & ASPART (70-30 MIX) 100 UNIT/ML ~~LOC~~ SUSP
15.0000 [IU] | Freq: Every day | SUBCUTANEOUS | Status: DC
  Administered 2017-12-23: 15 [IU] via SUBCUTANEOUS
  Filled 2017-12-22: qty 10

## 2017-12-22 MED ORDER — ENOXAPARIN SODIUM 40 MG/0.4ML ~~LOC~~ SOLN
40.0000 mg | Freq: Every day | SUBCUTANEOUS | Status: DC
  Administered 2017-12-22: 40 mg via SUBCUTANEOUS
  Filled 2017-12-22: qty 0.4

## 2017-12-22 MED ORDER — LIDOCAINE HCL (PF) 1 % IJ SOLN
INTRAMUSCULAR | Status: AC
  Filled 2017-12-22: qty 30

## 2017-12-22 MED ORDER — INSULIN ASPART PROT & ASPART (70-30 MIX) 100 UNIT/ML ~~LOC~~ SUSP: 5.0000 [IU] | Freq: Two times a day (BID) | SUBCUTANEOUS | Status: DC

## 2017-12-22 MED ORDER — SODIUM CHLORIDE 0.9 % IV SOLN: INTRAVENOUS | Status: DC

## 2017-12-22 MED ORDER — IOPAMIDOL (ISOVUE-370) INJECTION 76%
INTRAVENOUS | Status: AC
  Filled 2017-12-22: qty 125

## 2017-12-22 MED ORDER — ENOXAPARIN SODIUM 40 MG/0.4ML ~~LOC~~ SOLN
40.0000 mg | Freq: Every day | SUBCUTANEOUS | Status: DC
  Administered 2017-12-23: 40 mg via SUBCUTANEOUS
  Filled 2017-12-22: qty 0.4

## 2017-12-22 MED ORDER — MIDAZOLAM HCL 2 MG/2ML IJ SOLN
INTRAMUSCULAR | Status: AC
  Filled 2017-12-22: qty 2

## 2017-12-22 MED ORDER — TRAMADOL HCL 50 MG PO TABS: 50.0000 mg | ORAL_TABLET | Freq: Three times a day (TID) | ORAL | Status: DC | PRN

## 2017-12-22 MED ORDER — ASPIRIN 81 MG PO CHEW: 81.0000 mg | CHEWABLE_TABLET | ORAL | Status: DC

## 2017-12-22 MED ORDER — SODIUM CHLORIDE 0.9% FLUSH
3.0000 mL | Freq: Two times a day (BID) | INTRAVENOUS | Status: DC
  Administered 2017-12-22: 3 mL via INTRAVENOUS

## 2017-12-22 MED ORDER — VERAPAMIL HCL 2.5 MG/ML IV SOLN
INTRAVENOUS | Status: AC
  Filled 2017-12-22: qty 2

## 2017-12-22 MED ORDER — AMLODIPINE BESYLATE 5 MG PO TABS
5.0000 mg | ORAL_TABLET | Freq: Every day | ORAL | Status: DC
  Administered 2017-12-22 – 2017-12-23 (×2): 5 mg via ORAL
  Filled 2017-12-22 (×2): qty 1

## 2017-12-22 MED ORDER — HEPARIN SODIUM (PORCINE) 1000 UNIT/ML IJ SOLN
INTRAMUSCULAR | Status: DC | PRN
  Administered 2017-12-22: 4000 [IU] via INTRAVENOUS

## 2017-12-22 MED ORDER — FENTANYL CITRATE (PF) 100 MCG/2ML IJ SOLN
INTRAMUSCULAR | Status: AC
Start: 2017-12-22 — End: ?
  Filled 2017-12-22: qty 2

## 2017-12-22 MED ORDER — INSULIN ASPART 100 UNIT/ML ~~LOC~~ SOLN: 0.0000 [IU] | Freq: Three times a day (TID) | SUBCUTANEOUS | Status: DC

## 2017-12-22 MED ORDER — SODIUM CHLORIDE 0.9 % IV SOLN: INTRAVENOUS | Status: AC

## 2017-12-22 MED ORDER — MIDAZOLAM HCL 2 MG/2ML IJ SOLN
INTRAMUSCULAR | Status: DC | PRN
  Administered 2017-12-22 (×2): 1 mg via INTRAVENOUS

## 2017-12-22 MED ORDER — GI COCKTAIL ~~LOC~~: 30.0000 mL | Freq: Four times a day (QID) | ORAL | Status: DC | PRN

## 2017-12-22 MED ORDER — INSULIN ASPART 100 UNIT/ML ~~LOC~~ SOLN
0.0000 [IU] | Freq: Every day | SUBCUTANEOUS | Status: DC
  Administered 2017-12-22: 3 [IU] via SUBCUTANEOUS
  Filled 2017-12-22: qty 1

## 2017-12-22 MED ORDER — NITROGLYCERIN 0.4 MG SL SUBL: 0.4000 mg | SUBLINGUAL_TABLET | SUBLINGUAL | Status: DC | PRN

## 2017-12-22 MED ORDER — HEPARIN SODIUM (PORCINE) 1000 UNIT/ML IJ SOLN
INTRAMUSCULAR | Status: AC
  Filled 2017-12-22: qty 1

## 2017-12-22 MED ORDER — SODIUM CHLORIDE 0.9 % IV SOLN: 250.0000 mL | INTRAVENOUS | Status: DC | PRN

## 2017-12-22 MED ORDER — MORPHINE SULFATE (PF) 4 MG/ML IV SOLN: 2.0000 mg | INTRAVENOUS | Status: DC | PRN

## 2017-12-22 MED ORDER — ONDANSETRON HCL 4 MG/2ML IJ SOLN: 4.0000 mg | Freq: Four times a day (QID) | INTRAMUSCULAR | Status: DC | PRN

## 2017-12-22 MED ORDER — INSULIN ASPART PROT & ASPART (70-30 MIX) 100 UNIT/ML ~~LOC~~ SUSP
5.0000 [IU] | Freq: Every day | SUBCUTANEOUS | Status: DC
Start: 2017-12-22 — End: 2017-12-23
  Administered 2017-12-22: 5 [IU] via SUBCUTANEOUS

## 2017-12-22 MED ORDER — SODIUM CHLORIDE 0.9 % WEIGHT BASED INFUSION: 3.0000 mL/kg/h | INTRAVENOUS | Status: DC

## 2017-12-22 MED ORDER — SODIUM CHLORIDE 0.9% FLUSH: 3.0000 mL | Freq: Two times a day (BID) | INTRAVENOUS | Status: DC

## 2017-12-22 SURGICAL SUPPLY — 12 items
BAND CMPR LRG ZPHR (HEMOSTASIS) ×1
BAND ZEPHYR COMPRESS 30 LONG (HEMOSTASIS) ×1 IMPLANT
CATH INFINITI 5 FR 3DRC (CATHETERS) ×1 IMPLANT
CATH INFINITI 5 FR AR1 MOD (CATHETERS) ×2 IMPLANT
CATH INFINITI 5FR MULTPACK ANG (CATHETERS) ×2 IMPLANT
GUIDEWIRE INQWIRE 1.5J.035X260 (WIRE) ×1 IMPLANT
INQWIRE 1.5J .035X260CM (WIRE) ×2
KIT HEART LEFT (KITS) ×2 IMPLANT
PACK CARDIAC CATHETERIZATION (CUSTOM PROCEDURE TRAY) ×2 IMPLANT
SHEATH RAIN RADIAL 21G 6FR (SHEATH) ×1 IMPLANT
TRANSDUCER W/STOPCOCK (MISCELLANEOUS) ×2 IMPLANT
TUBING CIL FLEX 10 FLL-RA (TUBING) ×2 IMPLANT

## 2017-12-22 NOTE — ED Notes (Signed)
Pt st's pain has subsided at this time

## 2017-12-22 NOTE — ED Notes (Signed)
Patient wants a cup of water, the Nurse was informed.

## 2017-12-22 NOTE — ED Notes (Signed)
Ambulated pt to bathroom 

## 2017-12-22 NOTE — Progress Notes (Signed)
PROGRESS NOTE    Brenda Moreno  YQI:[id number] DOB: May 27, 1946 DOA: 12/21/2017 PCP: Susy Frizzle, MD   Brief Narrative:  72 year old woman with past medical history relevant for coronary artery disease status post CABG in 1998, type 2 diabetes on insulin, hypertension, hyperlipidemia who came in for chest pain.   Assessment & Plan:   Principal Problem:   Chest pain Active Problems:   Diabetes mellitus ([place])   Benign essential HTN   Hyperlipidemia   Coronary artery disease involving native coronary artery of native heart without angina pectoris   #) Chest pain in the setting of coronary artery disease status post CABG: Most consistent with GI upset however she does have history concerning as well as multiple risk factors. -Cardiology consult, plan for catheterization today - Continue metoprolol succinate 100 mg daily - Continue pravastatin 80 mg daily -Hold quinapril 40 mg twice daily -Continue aspirin 81 mg -Echo pending  #) Type 2 diabetes on insulin: - Continue 70/30 25 units twice daily - Hold home pioglitazone 30mg  daily -Sliding scale insulin plus AC joints  #) Hypertension: -Continue amlodipine 5 mg daily -Continue beta-blocker -Continue ACE inhibitor  #) chronic diastolic heart failure: -Echo pending -Hold home furosemide 20 mg daily -Hold potassium supplementation  #) Hyperlipidemia: -Continue statin  Fluids: Tolerating p.o. Liquids: Monitor and supplement Nutrition: N.p.o. 10 carb restricted diet  Prophylaxis: Enoxaparin  Disposition: Pending cardiac catheterization  Full code    Consultants:   Cardiology  Procedures: (Don't include imaging studies which can be auto populated. Include things that cannot be auto populated i.e. Echo, Carotid and venous dopplers, Foley, Bipap, HD, tubes/drains, wound vac, central lines etc)  Echo 12/22/2016: Pending  Antimicrobials: (specify start and planned stop date. Auto populated tables are space  occupying and do not give end dates)  None   Subjective: She reports that her chest pain is completely resolved.  She denies any nausea, vomiting, abdominal pain.  She denies any current chest pain at this time.  Objective: Vitals:   12/22/17 1350 12/22/17 1420 12/22/17 1435 12/22/17 1510  BP: (!) 141/38 (!) 160/47 (!) 136/44 (!) 131/27  Pulse: (!) 52 (!) 57 (!) 55 (!) 56  Resp: 19 (!) 21 (!) 21 (!) 22  Temp:      TempSrc:      SpO2: 98% 97% 96% 96%  Weight:      Height:        Intake/Output Summary (Last 24 hours) at 12/22/2017 1543 Last data filed at 12/22/2017 1200 Gross per 24 hour  Intake -  Output 250 ml  Net -250 ml   Filed Weights   12/21/17 1421  Weight: 82.6 kg (182 lb)    Examination:  General exam: Appears calm and comfortable  Respiratory system: Clear to auscultation. Respiratory effort normal. Cardiovascular system: 2 out of 6 systolic murmur heard best at left upper sternal border Gastrointestinal system: Abdomen is nondistended, soft and nontender. No organomegaly or masses felt. Normal bowel sounds heard. Central nervous system: Alert and oriented. No focal neurological deficits. Extremities: Trace lower extremity edema Skin: No rashes on visible skin Psychiatry: Judgement and insight appear normal. Mood & affect appropriate.     Data Reviewed: I have personally reviewed following labs and imaging studies  CBC: Recent Labs  Lab 12/21/17 1436 12/22/17 0457  WBC 8.4 7.7  NEUTROABS 6.1 3.4  HGB 12.8 11.9*  HCT 37.4 34.9*  MCV 79.9 79.5  PLT 219 387   Basic Metabolic Panel: Recent Labs  Lab 12/21/17 1436 12/22/17 0457  NA 138 139  K 4.1 3.5  CL 105 108  CO2 21* 21*  GLUCOSE 238* 134*  BUN 27* 24*  CREATININE 1.54* 1.29*  CALCIUM 9.3 8.8*   GFR: Estimated Creatinine Clearance: 44.6 mL/min (A) (by C-G formula based on SCr of 1.29 mg/dL (H)). Liver Function Tests: Recent Labs  Lab 12/21/17 1436  AST 21  ALT 15  ALKPHOS 80    BILITOT 0.6  PROT 6.4*  ALBUMIN 3.4*   Recent Labs  Lab 12/21/17 1436  LIPASE 36   No results for input(s): AMMONIA in the last 168 hours. Coagulation Profile: No results for input(s): INR, PROTIME in the last 168 hours. Cardiac Enzymes: Recent Labs  Lab 12/22/17 0057 12/22/17 0457 12/22/17 0617  TROPONINI <0.03 <0.03 <0.03   BNP (last 3 results) No results for input(s): PROBNP in the last 8760 hours. HbA1C: Recent Labs    12/22/17 0457  HGBA1C 9.9*   CBG: Recent Labs  Lab 12/22/17 0045 12/22/17 0807 12/22/17 1227  GLUCAP 282* 167* 198*   Lipid Profile: Recent Labs    12/22/17 0457  CHOL 244*  HDL 41  LDLCALC 167*  TRIG 182*  CHOLHDL 6.0   Thyroid Function Tests: No results for input(s): TSH, T4TOTAL, FREET4, T3FREE, THYROIDAB in the last 72 hours. Anemia Panel: No results for input(s): VITAMINB12, FOLATE, FERRITIN, TIBC, IRON, RETICCTPCT in the last 72 hours. Sepsis Labs: No results for input(s): PROCALCITON, LATICACIDVEN in the last 168 hours.  No results found for this or any previous visit (from the past 240 hour(s)).       Radiology Studies: Dg Chest 2 View  Result Date: 12/21/2017 CLINICAL DATA:  Chest pain. EXAM: CHEST - 2 VIEW COMPARISON:  Radiographs of October 20, 2016. FINDINGS: The heart size and mediastinal contours are within normal limits. Both lungs are clear. Status post coronary artery bypass graft. No pneumothorax or pleural effusion is noted. The visualized skeletal structures are unremarkable. IMPRESSION: No active cardiopulmonary disease. Electronically Signed   By: Marijo Conception, M.D.   On: 12/21/2017 15:26        Scheduled Meds: . [MAR Hold] amLODipine  5 mg Oral Daily  . aspirin  81 mg Oral Pre-Cath  . [MAR Hold] enoxaparin (LOVENOX) injection  40 mg Subcutaneous Daily  . [MAR Hold] insulin aspart  0-5 Units Subcutaneous QHS  . [MAR Hold] insulin aspart protamine- aspart  15 Units Subcutaneous Q breakfast   And   . [MAR Hold] insulin aspart protamine- aspart  5 Units Subcutaneous Q supper  . [MAR Hold] metoprolol succinate  100 mg Oral Daily  . [MAR Hold] pravastatin  80 mg Oral q1800  . sodium chloride flush  3 mL Intravenous Q12H   Continuous Infusions: . sodium chloride    . sodium chloride 75 mL/hr at 12/22/17 1325  . sodium chloride       LOS: 0 days    Time spent: [place], MD [facility]   If 7PM-7AM, please contact night-coverage [email or web address] Password [facility] 12/22/2017, 3:43 PM

## 2017-12-22 NOTE — Progress Notes (Signed)
Received from Cath lab.Patient alert and oriented, left radial cath site level 0, bruised but no hematoma.will monitor accordingly.

## 2017-12-22 NOTE — H&P (View-Only) (Signed)
Cardiology Consultation:   Patient ID: NICIE MILAN; [id number]; Jan 24, 1946   Admit date: 12/21/2017 Date of Consult: 12/22/2017  Primary Care Provider: Susy Frizzle, MD Primary Cardiologist: Dr. Acie Fredrickson  Patient Profile:   Brenda Moreno is a 72 y.o. female with a hx of CAD status post CABG in 1998, hypertension, hyperlipidemia, diabetes, peripheral vascular disease status post right iliac stenting (followed by vascular) and carotid occlusion who is being seen today for the evaluation of chest pain at the request of Dr. Herbert Moors.  No regular follow-up at clinic.  Last seen by Dr. Acie Fredrickson 05/2016.  History of Present Illness:   Brenda Moreno admitted for chest pain rule out.  She was in usual state of health up until yesterday when started having substernal chest pressure.  She was getting ready to go to [facility].  She had associated diaphoresis, nausea and vomiting.  She thought it was due to GI upset.  Drink ginger ale with improvement.  However, her chest pressure reoccurred while walking at the mall in [place].  She had recurrent associated diaphoresis and shortness of breath.  Family brought her to urgent care in [place] and transferred to [facility] for further evaluation.  No radiation of pain.  During her prior angina in 1998, she never had any chest pain but had a severe pain between shoulder blade.  In the ER, she was given GI cocktail with improvement however her chest pressure reoccurred.  She is chest pain-free since received sublingual nitro x1.  Patient eats lots of salt.  Noncompliant with diet.  No regular exercise.  Denies orthopnea, PND or syncope.  Has noted lower extremity edema.  Troponin has been remained negative.  Creatinine now improved to 1.29 from 1.54.  LDL 167.  Hemoglobin A1c 9.9.  Past Medical History:  Diagnosis Date  . Arthritis   . CAD (coronary artery disease)    S/P cabg  . Carotid artery occlusion   . Cataract   . CHF (congestive  heart failure) ([place])   . DDD (degenerative disc disease), lumbar   . Diabetes mellitus   . Hyperlipidemia   . Hypertension   . Joint pain   . Leg pain   . Myocardial infarction ([place]) 1998  . Peripheral vascular disease ([place])   . PVD (peripheral vascular disease) ([place])     Past Surgical History:  Procedure Laterality Date  . APPENDECTOMY    . BREAST EXCISIONAL BIOPSY Left 2008  . CHOLECYSTECTOMY     Gall bladder  . COLONOSCOPY  2008  . CORONARY ARTERY BYPASS GRAFT     1998  . PR VEIN BYPASS GRAFT,AORTO-FEM-POP    . TUBAL LIGATION       Inpatient Medications: Scheduled Meds: . amLODipine  5 mg Oral Daily  . enoxaparin (LOVENOX) injection  40 mg Subcutaneous Daily  . insulin aspart  0-5 Units Subcutaneous QHS  . insulin aspart protamine- aspart  15 Units Subcutaneous Q breakfast   And  . insulin aspart protamine- aspart  5 Units Subcutaneous Q supper  . metoprolol succinate  100 mg Oral Daily  . pravastatin  80 mg Oral q1800   Continuous Infusions:  PRN Meds: acetaminophen, gi cocktail, morphine injection, nitroGLYCERIN, ondansetron (ZOFRAN) IV, traMADol  Allergies:    Allergies  Allergen Reactions  . Levaquin [Levofloxacin In D5w] Other (See Comments)    Pain all over and in joints  . Statins Other (See Comments)    Severe myalgias to lipitor, crestor, pravastatin and weakness and  cramping  in bilateral leg.  Brenda Moreno [Linagliptin] Itching    Social History:   Social History   Socioeconomic History  . Marital status: Divorced    Spouse name: Not on file  . Number of children: Not on file  . Years of education: Not on file  . Highest education level: Not on file  Occupational History  . Not on file  Social Needs  . Financial resource strain: Not on file  . Food insecurity:    Worry: Not on file    Inability: Not on file  . Transportation needs:    Medical: Not on file    Non-medical: Not on file  Tobacco Use  . Smoking status: Former Smoker     Packs/day: 0.25    Types: Cigarettes    Last attempt to quit: 09/13/2011    Years since quitting: 6.2  . Smokeless tobacco: Never Used  Substance and Sexual Activity  . Alcohol use: No  . Drug use: No  . Sexual activity: Not on file  Lifestyle  . Physical activity:    Days per week: Not on file    Minutes per session: Not on file  . Stress: Not on file  Relationships  . Social connections:    Talks on phone: Not on file    Gets together: Not on file    Attends religious service: Not on file    Active member of club or organization: Not on file    Attends meetings of clubs or organizations: Not on file    Relationship status: Not on file  . Intimate partner violence:    Fear of current or ex partner: Not on file    Emotionally abused: Not on file    Physically abused: Not on file    Forced sexual activity: Not on file  Other Topics Concern  . Not on file  Social History Narrative  . Not on file    Family History:   Family History  Problem Relation Age of Onset  . Heart disease Mother   . Diabetes Mother   . Hyperlipidemia Mother   . Hypertension Mother   . Heart disease Father        Heart Disease before age 38  . Diabetes Father   . Hyperlipidemia Father   . Hypertension Father   . Heart disease Sister        heart attack  . Diabetes Sister        Amputation  . Hypertension Sister   . Other Sister        history of amputation  . Heart attack Sister   . Diabetes Brother   . Hypertension Brother   . Heart disease Brother 12       Before age 53  . Hyperlipidemia Brother   . Diabetes Brother        scirrosis of liver  . Coronary artery disease Other   . Colon cancer Neg Hx   . Stomach cancer Neg Hx   . Esophageal cancer Neg Hx      ROS:  Please see the history of present illness.  All other ROS reviewed and negative.     Physical Exam/Data:   Vitals:   12/22/17 0430 12/22/17 0500 12/22/17 0600 12/22/17 0700  BP: 139/65 (!) 150/66 (!) 134/52 (!)  142/57  Pulse: (!) 58 61 63 (!) 57  Resp: 18 20 (!) 23 20  Temp:      TempSrc:  SpO2: 99% 99% 94% 96%  Weight:      Height:       No intake or output data in the 24 hours ending 12/22/17 0947 Filed Weights   12/21/17 1421  Weight: 182 lb (82.6 kg)   Body mass index is 29.38 kg/m.  General:  Well nourished, well developed, in no acute distress HEENT: normal Lymph: no adenopathy Neck: no JVD Endocrine:  No thryomegaly Vascular: No carotid bruits; FA pulses 2+ bilaterally without bruits  Cardiac:  normal S1, S2; RRR; no murmur  Lungs:  clear to auscultation bilaterally, no wheezing, rhonchi or rales  Abd: soft, nontender, no hepatomegaly  Ext: Trace bilateral lower extremity edema Musculoskeletal:  No deformities, BUE and BLE strength normal and equal Skin: warm and dry  Neuro:  CNs 2-12 intact, no focal abnormalities noted Psych:  Normal affect   EKG:  The EKG was personally reviewed and demonstrates: EKG shows sinus rhythm at rate of 69 bpm, nonspecific T wave abnormality Telemetry:  Telemetry was personally reviewed and demonstrates: Sinus rhythm at rate of 60s  Relevant CV Studies: As summarized above  Laboratory Data:  Chemistry Recent Labs  Lab 12/21/17 1436 12/22/17 0457  NA 138 139  K 4.1 3.5  CL 105 108  CO2 21* 21*  GLUCOSE 238* 134*  BUN 27* 24*  CREATININE 1.54* 1.29*  CALCIUM 9.3 8.8*  GFRNONAA 33* 41*  GFRAA 39* 48*  ANIONGAP 12 10    Recent Labs  Lab 12/21/17 1436  PROT 6.4*  ALBUMIN 3.4*  AST 21  ALT 15  ALKPHOS 80  BILITOT 0.6   Hematology Recent Labs  Lab 12/21/17 1436 12/22/17 0457  WBC 8.4 7.7  RBC 4.68 4.39  HGB 12.8 11.9*  HCT 37.4 34.9*  MCV 79.9 79.5  MCH 27.4 27.1  MCHC 34.2 34.1  RDW 17.3* 16.6*  PLT 219 207   Cardiac Enzymes Recent Labs  Lab 12/22/17 0057 12/22/17 0457 12/22/17 0617  TROPONINI <0.03 <0.03 <0.03    Recent Labs  Lab 12/21/17 1443 12/21/17 2248  TROPIPOC 0.00 0.00      Radiology/Studies:  Dg Chest 2 View  Result Date: 12/21/2017 CLINICAL DATA:  Chest pain. EXAM: CHEST - 2 VIEW COMPARISON:  Radiographs of October 20, 2016. FINDINGS: The heart size and mediastinal contours are within normal limits. Both lungs are clear. Status post coronary artery bypass graft. No pneumothorax or pleural effusion is noted. The visualized skeletal structures are unremarkable. IMPRESSION: No active cardiopulmonary disease. Electronically Signed   By: Marijo Conception, M.D.   On: 12/21/2017 15:26    Assessment and Plan:   1. Chest pain concerning for cardiac etiology -History of CABG in 1998.  No evaluation since then per records. -Her pain yesterday concerning for angina.  She had a shoulder blade pain during CABG. -Her pain improved after GI cocktail however reoccurred and chest pain-free since got nitro.  -Her ongoing cardiac risk factors include uncontrolled diabetes and elevated cholesterol. -Troponin has been remained negative.  EKG with nonspecific changes. - Will do cath today. Hydrate.   2.  Bilateral lower extremity edema -Likely due to excess salt intake.  Spent greater than 15 minutes discussing diet and regular exercise.  Pending echocardiogram.  No orthopnea or PND.  3.  Controlled diabetes -A1c 9.9.  Management per primary team.  She will benefit from nutritionist consult.   4.  Hyperlipidemia - 12/22/2017: Cholesterol 244; HDL 41; LDL Cholesterol 167; Triglycerides 182; VLDL 36  - LDL  goal less than 70.  Statin listed as allergy.  Continue [name] 80 mg for now.  5.  AKI -Serum creatinine improved overnight.  6.  Hypertension -Blood pressure relatively stable.  Continue beta-blocker.  Baseline bradycardic in 50-60s.  ACE on hold due to AKI.  For questions or updates, please contact [place] Please consult [email or web address] for contact info under Cardiology/STEMI.   Jarrett Soho, PA  12/22/2017 9:47 AM   Pt seen and exmained   I  agree with findings as noted by B Bhagat above Pt is a 72 yo with known CAD  S/P CABG in 1998 Pt doing good until yesterday   Had a few spells of tightness, N/V   Writched   Eased     Came back   Came to ED   GI cocktail and ASA helped a little but did not take away  Pain went away with NTG She denies reflux  No problems with food   Has IBS but only lower gi symptoms   No Change  On exam:  Neck JVP normal  L bruid  Lungs are CTA   Cardiac RRR  No signif murmurs  Abd is benign Ext with 2+ DP  No edema  Pt has r/o for MI    Symptoms are concerning   I cannot explain by any noncardiac causes   Because of remote CABG I would recmm cath to redefine anatlomy Pt agrees to proceed   .  Dorris Carnes

## 2017-12-22 NOTE — Interval H&P Note (Signed)
History and Physical Interval Note:  12/22/2017 11:06 AM  Brenda Moreno  has presented today for cardiac cath with the diagnosis of unstable angina  The various methods of treatment have been discussed with the patient and family. After consideration of risks, benefits and other options for treatment, the patient has consented to  Procedure(s): LEFT HEART CATH AND CORS/GRAFTS ANGIOGRAPHY (N/A) as a surgical intervention .  The patient's history has been reviewed, patient examined, no change in status, stable for surgery.  I have reviewed the patient's chart and labs.  Questions were answered to the patient's satisfaction.    Cath Lab Visit (complete for each Cath Lab visit)  Clinical Evaluation Leading to the Procedure:   ACS: No.  Non-ACS:     Anginal Classification: CCS III  Anti-ischemic medical therapy: Maximal Therapy (2 or more classes of medications)  Non-Invasive Test Results: No non-invasive testing performed  Prior CABG: Previous CABG        Brenda Moreno

## 2017-12-22 NOTE — ED Notes (Signed)
Tamala Julian, MD at bedside.

## 2017-12-22 NOTE — Progress Notes (Signed)
Patient CBG 414 and is not on SSI. MD paged.

## 2017-12-22 NOTE — Progress Notes (Signed)
TR BAND REMOVAL  LOCATION:    Radial left radial  DEFLATED PER PROTOCOL:   yes  TIME BAND OFF / DRESSING APPLIED:    1510/gauze and tegaderm  SITE UPON ARRIVAL:    Level 0  SITE AFTER BAND REMOVAL:    Level 0. Faint bruise, no hematoma  CIRCULATION SENSATION AND MOVEMENT:    Within Normal Limits : yes; left hand and fingers warm and pink, sensation present  COMMENTS:

## 2017-12-22 NOTE — Progress Notes (Signed)
Inpatient Diabetes Program Recommendations  AACE/ADA: New Consensus Statement on Inpatient Glycemic Control (2015)  Target Ranges:  Prepandial:   less than 140 mg/dL      Peak postprandial:   less than 180 mg/dL (1-2 hours)      Critically ill patients:  140 - 180 mg/dL   Results for MAYLEE, BARE" (MRN [id number]) as of 12/22/2017 12:17  Ref. Range 12/22/2017 00:45 12/22/2017 08:07  Glucose-Capillary Latest Ref Range: 65 - 99 mg/dL 282 (H)  3 units Novolog 167 (H)    Results for KRYSTINA, STRIETER" (MRN [id number]) as of 12/22/2017 12:17  Ref. Range 12/22/2017 04:57  Hemoglobin A1C Latest Ref Range: 4.8 - 5.6 % 9.9 (H)  Average glucose 237 mg/dl    Admit with: CP  History: DM  Home DM Meds: 70/30 Insulin- 15 units AM/ 5 units PM       Actos 30 mg daily       Metformin 1000 mg BID  Current Insulin Orders: 70/30 Insulin- 15 units AM/ 5 units PM      Novolog 0-5 units QHS (per SSI)      PCP: Dr. Dennard Schaumann with [facility].  Last seen 05/04/2017.  Insulin doses reduced at that visit last year.  Current A1c shows poor glucose control at home.  Needs follow-up with PCP for further insulin adjustments.      MD- Please consider adding Novolog Sensitive Correction Scale/ SSI (0-9 units) TID AC   Currently only has orders for SSI at bedtime      --Will follow patient during hospitalization--  Wyn Quaker RN, MSN, CDE Diabetes Coordinator Inpatient Glycemic Control Team Team Pager: [phone] (8a-5p)

## 2017-12-22 NOTE — H&P (Signed)
History and Physical    Brenda Moreno FGH:[id number] DOB: [birth year] DOA: 12/21/2017  Referring MD/NP/PA: Antonietta Breach, PA-C PCP: Susy Frizzle, MD  Patient coming from: private vehicle  Chief Complaint: Chest pain  I have personally briefly reviewed patient's old medical records in [place]   HPI: Brenda Moreno is a 72 y.o. female with medical history significant of HTN, HLD, DM type II, CAD sp CABG, CHF, PVD, and IBS; who presents with complaints of chest pain.  Symptoms initially started while patient was driving to [facility] around 10 AM.  Complains of a pressure-like pain that she notes was located lower sternum and epigastrically.  She has stopped that she eats and notes becoming acutely diaphoretic, nauseous, and was noted to vomiting.  Vomitus was bloody and non-bilious in appearance.  Initially she attributed symptoms to indigestion or that she caught another GI bug.  She tried drinking some ginger ale with some mild improvement.  While at the mall patient reported having some dizziness and and blurred vision while walking.  Subsequently had acute onset of chest pain symptoms again for which she will loosen her bra as it felt like it was too tight.  Patient rates pain as a 6 out of 10 at its worse.  Other associated symptoms include leg swelling, intermittent diarrhea for which she is on IBS medication. Denies having any significant shortness of breath, recent change in weight, orthopnea, fever, chills, or hematemesis.  Patient notes that she has been off aspirin for a month or so after seeing recent studies suggesting that it was not helpful.  Otherwise the only other medication changes that she notes was that her insulin regimen was decreased and along with her diuretic medication sometime in January.   ED Course: Upon admission to the emergency department patient was noted to be afebrile, pulse 49-75, and all other vital signs relatively within normal limits.  Labs revealed  BUN 27, creatinine 1.4, glucose 238, and troponin negative x2.  EKG did not give any clear signs of ischemia.  Patient was given full dose aspirin, 1 lingual nitroglycerin, Zofran, GI cocktail, and started on normal saline IV fluids at 125 mL/h. TRH called due to risk factors for chest pain rule out.  Patient noted some improvement of chest pain symptoms after nitroglycerin.    Past Medical History:  Diagnosis Date  . Arthritis   . CAD (coronary artery disease)    S/P cabg  . Carotid artery occlusion   . Cataract   . CHF (congestive heart failure) ([place])   . DDD (degenerative disc disease), lumbar   . Diabetes mellitus   . Hyperlipidemia   . Hypertension   . Joint pain   . Leg pain   . Myocardial infarction ([place]) 1998  . Peripheral vascular disease ([place])   . PVD (peripheral vascular disease) ([place])     Past Surgical History:  Procedure Laterality Date  . APPENDECTOMY    . BREAST EXCISIONAL BIOPSY Left 2008  . CHOLECYSTECTOMY     Gall bladder  . COLONOSCOPY  2008  . CORONARY ARTERY BYPASS GRAFT     1998  . PR VEIN BYPASS GRAFT,AORTO-FEM-POP    . TUBAL LIGATION       reports that she quit smoking about 6 years ago. Her smoking use included cigarettes. She smoked 0.25 packs per day. She has never used smokeless tobacco. She reports that she does not drink alcohol or use drugs.  Allergies  Allergen Reactions  . Levaquin [  Levofloxacin In D5w] Other (See Comments)    Pain all over and in joints  . Statins Other (See Comments)    Severe myalgias to lipitor, crestor, pravastatin and weakness and cramping  in bilateral leg.  [name] [Linagliptin] Itching    Family History  Problem Relation Age of Onset  . Heart disease Mother   . Diabetes Mother   . Hyperlipidemia Mother   . Hypertension Mother   . Heart disease Father        Heart Disease before age 43  . Diabetes Father   . Hyperlipidemia Father   . Hypertension Father   . Heart disease Sister        heart attack   . Diabetes Sister        Amputation  . Hypertension Sister   . Other Sister        history of amputation  . Heart attack Sister   . Diabetes Brother   . Hypertension Brother   . Heart disease Brother 63       Before age 17  . Hyperlipidemia Brother   . Diabetes Brother        scirrosis of liver  . Coronary artery disease Other   . Colon cancer Neg Hx   . Stomach cancer Neg Hx   . Esophageal cancer Neg Hx     Prior to Admission medications   Medication Sig Start Date End Date Taking? Authorizing Provider  amLODipine (NORVASC) 5 MG tablet TAKE 1 TABLET BY MOUTH  DAILY 11/29/17  Yes Pickard, Cammie Mcgee, MD  Bioflavonoid Products (ESTER C PO) Take 1,000 mg by mouth daily.   Yes [provider]  CINNAMON PO Take 1,000 mg by mouth 2 (two) times daily.     Yes [provider]  Coenzyme Q10 (CO Q 10 PO) Take 100 mg by mouth daily.     Yes [provider]  furosemide (LASIX) 40 MG tablet Take 1 tablet (40 mg total) by mouth daily. Patient taking differently: Take 20 mg by mouth daily.  10/13/16  Yes Susy Frizzle, MD  insulin NPH-regular Human (HUMULIN 70/30) (70-30) 100 UNIT/ML injection Inject 15U Q AM and 5U Q HS Patient taking differently: Inject 25 Units into the skin 2 (two) times daily with a meal. Inject 15U Q AM and 5U Q HS 05/05/17  Yes Susy Frizzle, MD  metFORMIN (GLUCOPHAGE) 1000 MG tablet TAKE 1 TABLET BY MOUTH  TWICE A DAY WITH MEALS 11/29/17  Yes Susy Frizzle, MD  metoprolol succinate (TOPROL-XL) 100 MG 24 hr tablet Take 1 tablet by mouth  daily with or immediatley  following a meal Patient taking differently: Take 100MG  by mouth  daily with or immediatley  following a meal 11/30/15  Yes Susy Frizzle, MD  nitroGLYCERIN (NITROSTAT) 0.4 MG SL tablet Place 1 tablet (0.4 mg total) under the tongue every 5 (five) minutes as needed for chest pain. 11/12/14  Yes Susy Frizzle, MD  pioglitazone (ACTOS) 30 MG tablet TAKE 1 TABLET BY MOUTH   DAILY Patient taking differently: TAKE 1 TABLET BY MOUTH  in the evenng 11/29/17  Yes Pickard, Cammie Mcgee, MD  Pitavastatin Calcium (LIVALO) 4 MG TABS Take 1 tablet (4 mg total) by mouth daily. Patient taking differently: Take 1 tablet by mouth at bedtime.  05/04/17  Yes Susy Frizzle, MD  potassium chloride (K-DUR) 10 MEQ tablet Take 1 tablet (10 mEq total) by mouth daily. 06/02/16 12/21/17 Yes Nahser, Wonda Cheng,  MD  quinapril (ACCUPRIL) 40 MG tablet TAKE 1 TABLET BY MOUTH TWO  TIMES DAILY 11/29/17  Yes Susy Frizzle, MD  traMADol (ULTRAM) 50 MG tablet Take 1 tablet (50 mg total) by mouth every 8 (eight) hours as needed. 07/08/16  Yes Orlena Sheldon, PA-C    Physical Exam:  Constitutional: NAD, calm, comfortable Vitals:   12/22/17 0215 12/22/17 0245 12/22/17 0315 12/22/17 0345  BP: (!) 144/68 (!) 127/56 (!) 130/57 (!) 147/59  Pulse: 72 69 66 68  Resp: 17 17 19 16   Temp:      TempSrc:      SpO2: 99% 98% 97% 96%  Weight:      Height:       Eyes: PERRL, lids and conjunctivae normal ENMT: Mucous membranes are moist. Posterior pharynx clear of any exudate or lesions.Normal dentition.  Neck: normal, supple, no masses, no thyromegaly Respiratory: clear to auscultation bilaterally, no wheezing, no crackles. Normal respiratory effort. No accessory muscle use.  Cardiovascular: Regular rate and rhythm, no murmurs / rubs gallops 1+ pitting lower extremity edema. 2+ pedal pulses. No carotid bruits.  Abdomen: no tenderness, no masses palpated. No hepatosplenomegaly. Bowel sounds positive.  Musculoskeletal: no clubbing / cyanosis. No joint deformity upper and lower extremities. Good ROM, no contractures. Normal muscle tone.  Skin: no rashes, lesions, ulcers. No induration Neurologic: CN 2-12 grossly intact. Sensation intact, DTR normal. Strength 5/5 in all 4.  Psychiatric: Normal judgment and insight. Alert and oriented x 3. Normal mood.     Labs on Admission: I have personally reviewed following  labs and imaging studies  CBC: Recent Labs  Lab 12/21/17 1436 12/22/17 0457  WBC 8.4 7.7  NEUTROABS 6.1 3.4  HGB 12.8 11.9*  HCT 37.4 34.9*  MCV 79.9 79.5  PLT 219 932   Basic Metabolic Panel: Recent Labs  Lab 12/21/17 1436 12/22/17 0457  NA 138 139  K 4.1 3.5  CL 105 108  CO2 21* 21*  GLUCOSE 238* 134*  BUN 27* 24*  CREATININE 1.54* 1.29*  CALCIUM 9.3 8.8*   GFR: Estimated Creatinine Clearance: 44.6 mL/min (A) (by C-G formula based on SCr of 1.29 mg/dL (H)). Liver Function Tests: Recent Labs  Lab 12/21/17 1436  AST 21  ALT 15  ALKPHOS 80  BILITOT 0.6  PROT 6.4*  ALBUMIN 3.4*   Recent Labs  Lab 12/21/17 1436  LIPASE 36   No results for input(s): AMMONIA in the last 168 hours. Coagulation Profile: No results for input(s): INR, PROTIME in the last 168 hours. Cardiac Enzymes: Recent Labs  Lab 12/22/17 0057 12/22/17 0457  TROPONINI <0.03 <0.03   BNP (last 3 results) No results for input(s): PROBNP in the last 8760 hours. HbA1C: Recent Labs    12/22/17 0457  HGBA1C 9.9*   CBG: Recent Labs  Lab 12/22/17 0045  GLUCAP 282*   Lipid Profile: Recent Labs    12/22/17 0457  CHOL 244*  HDL 41  LDLCALC 167*  TRIG 182*  CHOLHDL 6.0   Thyroid Function Tests: No results for input(s): TSH, T4TOTAL, FREET4, T3FREE, THYROIDAB in the last 72 hours. Anemia Panel: No results for input(s): VITAMINB12, FOLATE, FERRITIN, TIBC, IRON, RETICCTPCT in the last 72 hours. Urine analysis:    Component Value Date/Time   COLORURINE STRAW (A) 03/20/2017 1211   APPEARANCEUR CLEAR 03/20/2017 1211   LABSPEC 1.012 03/20/2017 1211   PHURINE 5.0 03/20/2017 1211   GLUCOSEU NEGATIVE 03/20/2017 1211   HGBUR NEGATIVE 03/20/2017 1211   BILIRUBINUR NEGATIVE  03/20/2017 1211   [place] 03/20/2017 1211   PROTEINUR NEGATIVE 03/20/2017 1211   UROBILINOGEN 0.2 07/12/2013 1055   NITRITE NEGATIVE 03/20/2017 1211   LEUKOCYTESUR NEGATIVE 03/20/2017 1211   Sepsis  Labs: No results found for this or any previous visit (from the past 240 hour(s)).   Radiological Exams on Admission: Dg Chest 2 View  Result Date: 12/21/2017 CLINICAL DATA:  Chest pain. EXAM: CHEST - 2 VIEW COMPARISON:  Radiographs of October 20, 2016. FINDINGS: The heart size and mediastinal contours are within normal limits. Both lungs are clear. Status post coronary artery bypass graft. No pneumothorax or pleural effusion is noted. The visualized skeletal structures are unremarkable. IMPRESSION: No active cardiopulmonary disease. Electronically Signed   By: Marijo Conception, M.D.   On: 12/21/2017 15:26    EKG: Independently reviewed.  Sinus rhythm 69 bpm with nonspecific ST-T wave changes.  Assessment/Plan Chest pain, history of coronary artery disease: Acute.  Reports having substernal chest pressure with associated nausea, vomiting, and diaphoresis.  Initial troponin negative.  History concerning given previous CAD status post CABG.   - Admit to a telemetry bed - Trend cardiac troponin x3 every 3 hours - Nitroglycerin prn Chest pain - Aspirin - Check lipid panel in a.m. - Check echocardiogram in a.m. - Message sent for cardiology to eval   Renal insufficiency, chronic kidney disease stage III: Patient presents with a creatinine of 1.54 with BUN 27.  Baseline creatinine previously have been less than 1.34 in the past.  Suspect likely prerenal in the setting of patient being on diuretics. - Continue IV fluids as tolerated - Hold nephrotoxic agents - Recheck BMP in a.m - CBGs q. before meals and at bedtime adjust regimen as needed  Diabetes mellitus type 2: Last available hemoglobin A1c noted to be 7.7 back in 12/2015.  Patient presents with elevated blood glucose of 238, but no elevation in anion gap noted. - Hypoglycemic protocols - Hold metformin and Actos - Continue 70/30 insulin regimen  Essential hypertension: Patient reports recently running out of metoprolol. - Held  furosemide and quinapril due to renal insufficiency - Continue metoprolol  Hyperlipidemia: - Continue pharmacy substitution for pravastatin   DVT prophylaxis: Lovenox Code Status: full Family Communication: No family present at bedside Disposition Plan: Likely discharge home if work-up negative Consults called: none  Admission status: Observation  Norval Morton MD [facility] Pager [phone]   If 7PM-7AM, please contact night-coverage [email or web address] Password [facility]  12/22/2017, 6:39 AM

## 2017-12-22 NOTE — Consult Note (Addendum)
Cardiology Consultation:   Patient ID: Brenda Moreno; [id number]; Mar 20, 1946   Admit date: 12/21/2017 Date of Consult: 12/22/2017  Primary Care Provider: Susy Frizzle, MD Primary Cardiologist: Dr. Acie Fredrickson  Patient Profile:   Brenda Moreno is a 72 y.o. female with a hx of CAD status post CABG in 1998, hypertension, hyperlipidemia, diabetes, peripheral vascular disease status post right iliac stenting (followed by vascular) and carotid occlusion who is being seen today for the evaluation of chest pain at the request of Dr. Herbert Moors.  No regular follow-up at clinic.  Last seen by Dr. Acie Fredrickson 05/2016.  History of Present Illness:   Brenda Moreno admitted for chest pain rule out.  She was in usual state of health up until yesterday when started having substernal chest pressure.  She was getting ready to go to [facility]  She had associated diaphoresis, nausea and vomiting.  She thought it was due to GI upset.  Drink ginger ale with improvement.  However, her chest pressure reoccurred while walking at the mall in [place].  She had recurrent associated diaphoresis and shortness of breath.  Family brought her to urgent care in [place] and transferred to [facility] for further evaluation.  No radiation of pain.  During her prior angina in 1998, she never had any chest pain but had a severe pain between shoulder blade.  In the ER, she was given GI cocktail with improvement however her chest pressure reoccurred.  She is chest pain-free since received sublingual nitro x1.  Patient eats lots of salt.  Noncompliant with diet.  No regular exercise.  Denies orthopnea, PND or syncope.  Has noted lower extremity edema.  Troponin has been remained negative.  Creatinine now improved to 1.29 from 1.54.  LDL 167.  Hemoglobin A1c 9.9.  Past Medical History:  Diagnosis Date  . Arthritis   . CAD (coronary artery disease)    S/P cabg  . Carotid artery occlusion   . Cataract   . CHF (congestive  heart failure) ([place])   . DDD (degenerative disc disease), lumbar   . Diabetes mellitus   . Hyperlipidemia   . Hypertension   . Joint pain   . Leg pain   . Myocardial infarction ([place]) 1998  . Peripheral vascular disease ([place])   . PVD (peripheral vascular disease) ([place])     Past Surgical History:  Procedure Laterality Date  . APPENDECTOMY    . BREAST EXCISIONAL BIOPSY Left 2008  . CHOLECYSTECTOMY     Gall bladder  . COLONOSCOPY  2008  . CORONARY ARTERY BYPASS GRAFT     1998  . PR VEIN BYPASS GRAFT,AORTO-FEM-POP    . TUBAL LIGATION       Inpatient Medications: Scheduled Meds: . amLODipine  5 mg Oral Daily  . enoxaparin (LOVENOX) injection  40 mg Subcutaneous Daily  . insulin aspart  0-5 Units Subcutaneous QHS  . insulin aspart protamine- aspart  15 Units Subcutaneous Q breakfast   And  . insulin aspart protamine- aspart  5 Units Subcutaneous Q supper  . metoprolol succinate  100 mg Oral Daily  . pravastatin  80 mg Oral q1800   Continuous Infusions:  PRN Meds: acetaminophen, gi cocktail, morphine injection, nitroGLYCERIN, ondansetron (ZOFRAN) IV, traMADol  Allergies:    Allergies  Allergen Reactions  . Levaquin [Levofloxacin In D5w] Other (See Comments)    Pain all over and in joints  . Statins Other (See Comments)    Severe myalgias to lipitor, crestor, pravastatin and weakness and  cramping  in bilateral leg.  Brenda Moreno [Linagliptin] Itching    Social History:   Social History   Socioeconomic History  . Marital status: Divorced    Spouse name: Not on file  . Number of children: Not on file  . Years of education: Not on file  . Highest education level: Not on file  Occupational History  . Not on file  Social Needs  . Financial resource strain: Not on file  . Food insecurity:    Worry: Not on file    Inability: Not on file  . Transportation needs:    Medical: Not on file    Non-medical: Not on file  Tobacco Use  . Smoking status: Former Smoker     Packs/day: 0.25    Types: Cigarettes    Last attempt to quit: 09/13/2011    Years since quitting: 6.2  . Smokeless tobacco: Never Used  Substance and Sexual Activity  . Alcohol use: No  . Drug use: No  . Sexual activity: Not on file  Lifestyle  . Physical activity:    Days per week: Not on file    Minutes per session: Not on file  . Stress: Not on file  Relationships  . Social connections:    Talks on phone: Not on file    Gets together: Not on file    Attends religious service: Not on file    Active member of club or organization: Not on file    Attends meetings of clubs or organizations: Not on file    Relationship status: Not on file  . Intimate partner violence:    Fear of current or ex partner: Not on file    Emotionally abused: Not on file    Physically abused: Not on file    Forced sexual activity: Not on file  Other Topics Concern  . Not on file  Social History Narrative  . Not on file    Family History:   Family History  Problem Relation Age of Onset  . Heart disease Mother   . Diabetes Mother   . Hyperlipidemia Mother   . Hypertension Mother   . Heart disease Father        Heart Disease before age 71  . Diabetes Father   . Hyperlipidemia Father   . Hypertension Father   . Heart disease Sister        heart attack  . Diabetes Sister        Amputation  . Hypertension Sister   . Other Sister        history of amputation  . Heart attack Sister   . Diabetes Brother   . Hypertension Brother   . Heart disease Brother 80       Before age 50  . Hyperlipidemia Brother   . Diabetes Brother        scirrosis of liver  . Coronary artery disease Other   . Colon cancer Neg Hx   . Stomach cancer Neg Hx   . Esophageal cancer Neg Hx      ROS:  Please see the history of present illness.  All other ROS reviewed and negative.     Physical Exam/Data:   Vitals:   12/22/17 0430 12/22/17 0500 12/22/17 0600 12/22/17 0700  BP: 139/65 (!) 150/66 (!) 134/52 (!)  142/57  Pulse: (!) 58 61 63 (!) 57  Resp: 18 20 (!) 23 20  Temp:      TempSrc:  SpO2: 99% 99% 94% 96%  Weight:      Height:       No intake or output data in the 24 hours ending 12/22/17 0947 Filed Weights   12/21/17 1421  Weight: 182 lb (82.6 kg)   Body mass index is 29.38 kg/m.  General:  Well nourished, well developed, in no acute distress HEENT: normal Lymph: no adenopathy Neck: no JVD Endocrine:  No thryomegaly Vascular: No carotid bruits; FA pulses 2+ bilaterally without bruits  Cardiac:  normal S1, S2; RRR; no murmur  Lungs:  clear to auscultation bilaterally, no wheezing, rhonchi or rales  Abd: soft, nontender, no hepatomegaly  Ext: Trace bilateral lower extremity edema Musculoskeletal:  No deformities, BUE and BLE strength normal and equal Skin: warm and dry  Neuro:  CNs 2-12 intact, no focal abnormalities noted Psych:  Normal affect   EKG:  The EKG was personally reviewed and demonstrates: EKG shows sinus rhythm at rate of 69 bpm, nonspecific T wave abnormality Telemetry:  Telemetry was personally reviewed and demonstrates: Sinus rhythm at rate of 60s  Relevant CV Studies: As summarized above  Laboratory Data:  Chemistry Recent Labs  Lab 12/21/17 1436 12/22/17 0457  NA 138 139  K 4.1 3.5  CL 105 108  CO2 21* 21*  GLUCOSE 238* 134*  BUN 27* 24*  CREATININE 1.54* 1.29*  CALCIUM 9.3 8.8*  GFRNONAA 33* 41*  GFRAA 39* 48*  ANIONGAP 12 10    Recent Labs  Lab 12/21/17 1436  PROT 6.4*  ALBUMIN 3.4*  AST 21  ALT 15  ALKPHOS 80  BILITOT 0.6   Hematology Recent Labs  Lab 12/21/17 1436 12/22/17 0457  WBC 8.4 7.7  RBC 4.68 4.39  HGB 12.8 11.9*  HCT 37.4 34.9*  MCV 79.9 79.5  MCH 27.4 27.1  MCHC 34.2 34.1  RDW 17.3* 16.6*  PLT 219 207   Cardiac Enzymes Recent Labs  Lab 12/22/17 0057 12/22/17 0457 12/22/17 0617  TROPONINI <0.03 <0.03 <0.03    Recent Labs  Lab 12/21/17 1443 12/21/17 2248  TROPIPOC 0.00 0.00      Radiology/Studies:  Dg Chest 2 View  Result Date: 12/21/2017 CLINICAL DATA:  Chest pain. EXAM: CHEST - 2 VIEW COMPARISON:  Radiographs of October 20, 2016. FINDINGS: The heart size and mediastinal contours are within normal limits. Both lungs are clear. Status post coronary artery bypass graft. No pneumothorax or pleural effusion is noted. The visualized skeletal structures are unremarkable. IMPRESSION: No active cardiopulmonary disease. Electronically Signed   By: Marijo Conception, M.D.   On: 12/21/2017 15:26    Assessment and Plan:   1. Chest pain concerning for cardiac etiology -History of CABG in 1998.  No evaluation since then per records. -Her pain yesterday concerning for angina.  She had a shoulder blade pain during CABG. -Her pain improved after GI cocktail however reoccurred and chest pain-free since got nitro.  -Her ongoing cardiac risk factors include uncontrolled diabetes and elevated cholesterol. -Troponin has been remained negative.  EKG with nonspecific changes. - Will do cath today. Hydrate.   2.  Bilateral lower extremity edema -Likely due to excess salt intake.  Spent greater than 15 minutes discussing diet and regular exercise.  Pending echocardiogram.  No orthopnea or PND.  3.  Controlled diabetes -A1c 9.9.  Management per primary team.  She will benefit from nutritionist consult.   4.  Hyperlipidemia - 12/22/2017: Cholesterol 244; HDL 41; LDL Cholesterol 167; Triglycerides 182; VLDL 36  - LDL  goal less than 70.  Statin listed as allergy.  Continue [name] 80 mg for now.  5.  AKI -Serum creatinine improved overnight.  6.  Hypertension -Blood pressure relatively stable.  Continue beta-blocker.  Baseline bradycardic in 50-60s.  ACE on hold due to AKI.  For questions or updates, please contact [place] Please consult [email or web address] for contact info under Cardiology/STEMI.   Jarrett Soho, PA  12/22/2017 9:47 AM   Pt seen and exmained   I  agree with findings as noted by B Bhagat above Pt is a 72 yo with known CAD  S/P CABG in 1998 Pt doing good until yesterday   Had a few spells of tightness, N/V   Writched   Eased     Came back   Came to ED   GI cocktail and ASA helped a little but did not take away  Pain went away with NTG She denies reflux  No problems with food   Has IBS but only lower gi symptoms   No Change  On exam:  Neck JVP normal  L bruid  Lungs are CTA   Cardiac RRR  No signif murmurs  Abd is benign Ext with 2+ DP  No edema  Pt has r/o for MI    Symptoms are concerning   I cannot explain by any noncardiac causes   Because of remote CABG I would recmm cath to redefine anatlomy Pt agrees to proceed   .  Dorris Carnes

## 2017-12-23 ENCOUNTER — Observation Stay (HOSPITAL_BASED_OUTPATIENT_CLINIC_OR_DEPARTMENT_OTHER): Payer: Medicare Other

## 2017-12-23 ENCOUNTER — Other Ambulatory Visit: Payer: Self-pay

## 2017-12-23 DIAGNOSIS — I10 Essential (primary) hypertension: Secondary | ICD-10-CM | POA: Diagnosis not present

## 2017-12-23 DIAGNOSIS — R072 Precordial pain: Secondary | ICD-10-CM | POA: Diagnosis not present

## 2017-12-23 DIAGNOSIS — R079 Chest pain, unspecified: Secondary | ICD-10-CM

## 2017-12-23 DIAGNOSIS — I2510 Atherosclerotic heart disease of native coronary artery without angina pectoris: Secondary | ICD-10-CM | POA: Diagnosis not present

## 2017-12-23 LAB — GLUCOSE, CAPILLARY: GLUCOSE-CAPILLARY: 216 mg/dL — AB (ref 65–99)

## 2017-12-23 LAB — CBC
HCT: 35.4 % — ABNORMAL LOW (ref 36.0–46.0)
Hemoglobin: 12 g/dL (ref 12.0–15.0)
MCH: 27.1 pg (ref 26.0–34.0)
MCHC: 33.9 g/dL (ref 30.0–36.0)
MCV: 80.1 fL (ref 78.0–100.0)
Platelets: 194 10*3/uL (ref 150–400)
RBC: 4.42 MIL/uL (ref 3.87–5.11)
RDW: 17 % — AB (ref 11.5–15.5)
WBC: 6.1 10*3/uL (ref 4.0–10.5)

## 2017-12-23 LAB — MAGNESIUM: MAGNESIUM: 1.6 mg/dL — AB (ref 1.7–2.4)

## 2017-12-23 LAB — ECHOCARDIOGRAM COMPLETE
Height: 66 in
Weight: 2912 oz

## 2017-12-23 LAB — BASIC METABOLIC PANEL
ANION GAP: 8 (ref 5–15)
BUN: 20 mg/dL (ref 6–20)
CO2: 25 mmol/L (ref 22–32)
Calcium: 8.6 mg/dL — ABNORMAL LOW (ref 8.9–10.3)
Chloride: 104 mmol/L (ref 101–111)
Creatinine, Ser: 1.14 mg/dL — ABNORMAL HIGH (ref 0.44–1.00)
GFR calc non Af Amer: 48 mL/min — ABNORMAL LOW (ref >60)
GFR, EST AFRICAN AMERICAN: 56 mL/min — AB (ref >60)
GLUCOSE: 174 mg/dL — AB (ref 65–99)
POTASSIUM: 3.8 mmol/L (ref 3.5–5.1)
Sodium: 137 mmol/L (ref 135–145)

## 2017-12-23 MED ORDER — PANTOPRAZOLE SODIUM 20 MG PO TBEC
20.0000 mg | DELAYED_RELEASE_TABLET | Freq: Every day | ORAL | Status: DC
  Administered 2017-12-23: 20 mg via ORAL
  Filled 2017-12-23: qty 1

## 2017-12-23 NOTE — Progress Notes (Signed)
  Echocardiogram 2D Echocardiogram has been performed.  Johny Chess 12/23/2017, 1:13 PM

## 2017-12-23 NOTE — Progress Notes (Signed)
Progress to home, pt alert and oriented no distress. PIV removed no s/s of infiltration or swelling noted. D/c instructions and ff up appt.done and discussed with pt and husband at bedside verbalized understanding.

## 2017-12-23 NOTE — Discharge Summary (Signed)
Physician Discharge Summary  LAWRENCE ROLDAN TIW:[id number] DOB: 09-17-1945 DOA: 12/21/2017  PCP: Susy Frizzle, MD  Admit date: 12/21/2017 Discharge date: 12/23/2017  Admitted From: Home Disposition:  Home  Recommendations for Outpatient Follow-up:  1. Follow up with PCP in 1-2 weeks 2. Please obtain BMP/CBC in one week 3. Please follow up on the following pending results: echo ordered 12/23/17  Home Health: no Equipment/Devices: none  Discharge Condition: stable CODE STATUS: full Diet recommendation: Heart Healthy    Brief/Interim Summary:  #) Chest pain with history of coronary artery disease status post CABG: Patient was admitted with chest pain that was equivocal for angina.  She had a cardiac catheterization on 12/22/2017 that showed no focal targets for PCI, prior CABG with 2 grafts down, stenosis of the circumflex that was moderate.  She did receive an echo on 12/23/2017 that is pending.  Patient was continued on her home metoprolol dose and was maintained on pravastatin 80 mg daily here.  She will restart her home statin on discharge.  #) Type 2 diabetes on insulin: A1c was 9.9.  She was maintained on her 70/30 and sliding scale insulin with meals.  Her home pioglitazone was held.  This was restarted on discharge.  She was told to increase her insulin dose with follow-up with her PCP.  #) Hypertension: Patient was continued on amlodipine and beta-blocker.  Her ACE inhibitor was held with cardiac catheterization and was restarted on discharge.  #) Chronic diastolic heart failure: Patient's diuretics were held pending cardiac catheterization and restarted on discharge.  #) Hyperlipidemia: Patient was continued on statin.  Discharge Diagnoses:  Principal Problem:   Chest pain Active Problems:   Diabetes mellitus (HCC)   Benign essential HTN   Hyperlipidemia   Coronary artery disease involving native coronary artery of native heart without angina pectoris    Discharge  Instructions  Discharge Instructions    Call MD for:  difficulty breathing, headache or visual disturbances   Complete by:  As directed    Call MD for:  hives   Complete by:  As directed    Call MD for:  persistant nausea and vomiting   Complete by:  As directed    Call MD for:  redness, tenderness, or signs of infection (pain, swelling, redness, odor or green/yellow discharge around incision site)   Complete by:  As directed    Call MD for:  severe uncontrolled pain   Complete by:  As directed    Call MD for:  temperature >100.4   Complete by:  As directed    Diet - low sodium heart healthy   Complete by:  As directed    Discharge instructions   Complete by:  As directed    Follow-up with your primary care doctor in 1 week.  Please follow-up with your cardiologist in 1 to 2 weeks.   Increase activity slowly   Complete by:  As directed      Allergies as of 12/23/2017      Reactions   Levaquin [levofloxacin In D5w] Other (See Comments)   Pain all over and in joints   Statins Other (See Comments)   Severe myalgias to lipitor, crestor, pravastatin and weakness and cramping  in bilateral leg.   Tradjenta [linagliptin] Itching      Medication List    TAKE these medications   amLODipine 5 MG tablet Commonly known as:  NORVASC TAKE 1 TABLET BY MOUTH  DAILY   CINNAMON PO Take 1,000 mg  by mouth 2 (two) times daily.   CO Q 10 PO Take 100 mg by mouth daily.   ESTER C PO Take 1,000 mg by mouth daily.   furosemide 40 MG tablet Commonly known as:  LASIX Take 1 tablet (40 mg total) by mouth daily. What changed:  how much to take   insulin NPH-regular Human (70-30) 100 UNIT/ML injection Commonly known as:  HUMULIN 70/30 Inject 15U Q AM and 5U Q HS What changed:    how much to take  how to take this  when to take this  additional instructions   metFORMIN 1000 MG tablet Commonly known as:  GLUCOPHAGE TAKE 1 TABLET BY MOUTH  TWICE A DAY WITH MEALS   metoprolol  succinate 100 MG 24 hr tablet Commonly known as:  TOPROL-XL Take 1 tablet by mouth  daily with or immediatley  following a meal What changed:  See the new instructions.   nitroGLYCERIN 0.4 MG SL tablet Commonly known as:  NITROSTAT Place 1 tablet (0.4 mg total) under the tongue every 5 (five) minutes as needed for chest pain.   pioglitazone 30 MG tablet Commonly known as:  ACTOS TAKE 1 TABLET BY MOUTH  DAILY What changed:    how much to take  how to take this  when to take this   Pitavastatin Calcium 4 MG Tabs Commonly known as:  LIVALO Take 1 tablet (4 mg total) by mouth daily. What changed:  when to take this   potassium chloride 10 MEQ tablet Commonly known as:  K-DUR Take 1 tablet (10 mEq total) by mouth daily.   quinapril 40 MG tablet Commonly known as:  ACCUPRIL TAKE 1 TABLET BY MOUTH TWO  TIMES DAILY   traMADol 50 MG tablet Commonly known as:  ULTRAM Take 1 tablet (50 mg total) by mouth every 8 (eight) hours as needed.      Follow-up Information    Nahser, Wonda Cheng, MD Follow up.   Specialty:  Cardiology Why:  Office will contact you with an appointment to see Dr Acie Fredrickson or his APP in 2 weeks Contact information: [address] [place] [address] [phone]          Allergies  Allergen Reactions  . Levaquin [Levofloxacin In D5w] Other (See Comments)    Pain all over and in joints  . Statins Other (See Comments)    Severe myalgias to lipitor, crestor, pravastatin and weakness and cramping  in bilateral leg.  [name] [Linagliptin] Itching    Consultations:  Cardiology   Procedures/Studies: Dg Chest 2 View  Result Date: 12/21/2017 CLINICAL DATA:  Chest pain. EXAM: CHEST - 2 VIEW COMPARISON:  Radiographs of October 20, 2016. FINDINGS: The heart size and mediastinal contours are within normal limits. Both lungs are clear. Status post coronary artery bypass graft. No pneumothorax or pleural effusion is noted. The visualized  skeletal structures are unremarkable. IMPRESSION: No active cardiopulmonary disease. Electronically Signed   By: Marijo Conception, M.D.   On: 12/21/2017 15:26   Echo 12/23/2017: Pending   Cardiac catheterization 12/22/2017: Prox RCA to Mid RCA lesion is 50% stenosed.  Post Atrio lesion is 99% stenosed.  Ost RPDA lesion is 99% stenosed.  SVG graft was visualized by angiography and is normal in caliber.  Ost LAD to Prox LAD lesion is 95% stenosed.  SVG graft was visualized by angiography and is normal in caliber.  SVG graft was visualized by angiography and is normal in caliber.  Ost Cx to Prox Cx lesion is 70% stenosed.  Ost LM to Mid LM lesion is 50% stenosed.  Origin lesion is 100% stenosed.  SVG graft was not visualized due to known occlusion.   1. Severe triple vessel CAD s/p 6V CABG with 5/6 patent grafts.  2. The LAD has a severe proximal stenosis. The LIMA graft to the mid LAD is patent and is seen to fill antegrade and also retrograde from native coronary injection. The vein grafts to both Diagonal branches are patent.  3. The Circumflex has moderate proximal stenosis. This vessel terminates into a small OM branch. The vein graft to the OM branch is known to be occluded.  4. The RCA has moderate mid stenosis and then severe stenosis in the proximal segments of the PLA and PDA. The sequential vein graft to the PDA and PLA is patent.   Recommendations: No focal targets for PCI. The Circumflex stenosis appears to be moderate. This vessel is moderate in caliber. If she has angina that cannot be controlled with optimal medical therapy, could consider PCI of the Circumflex at another time. Continue medical therapy for CAD.     Subjective:   Discharge Exam: Vitals:   12/23/17 0500 12/23/17 0840  BP: (!) 153/64 (!) 161/68  Pulse: (!) 53 (!) 47  Resp: 18 16  Temp: 97.7 F (36.5 C)   SpO2: 97% 99%   Vitals:   12/22/17 1950 12/22/17 2330 12/23/17 0500 12/23/17 0840  BP:  139/60 128/65 (!) 153/64 (!) 161/68  Pulse: (!) 50 (!) 49 (!) 53 (!) 47  Resp: 18 18 18 16   Temp: 98.6 F (37 C) 98.3 F (36.8 C) 97.7 F (36.5 C)   TempSrc: Oral Oral Oral   SpO2: 97% 97% 97% 99%  Weight:      Height:        General: Pt is alert, awake, not in acute distress Cardiovascular: RRR, S1/S2 +, no rubs, no gallops Respiratory: CTA bilaterally, no wheezing, no rhonchi Abdominal: Soft, NT, ND, bowel sounds + Extremities: 1+ lower extremity edema    The results of significant diagnostics from this hospitalization (including imaging, microbiology, ancillary and laboratory) are listed below for reference.     Microbiology: No results found for this or any previous visit (from the past 240 hour(s)).   Labs: BNP (last 3 results) No results for input(s): BNP in the last 8760 hours. Basic Metabolic Panel: Recent Labs  Lab 12/21/17 1436 12/22/17 0457 12/23/17 0655  NA 138 139 137  K 4.1 3.5 3.8  CL 105 108 104  CO2 21* 21* 25  GLUCOSE 238* 134* 174*  BUN 27* 24* 20  CREATININE 1.54* 1.29* 1.14*  CALCIUM 9.3 8.8* 8.6*  MG  --   --  1.6*   Liver Function Tests: Recent Labs  Lab 12/21/17 1436  AST 21  ALT 15  ALKPHOS 80  BILITOT 0.6  PROT 6.4*  ALBUMIN 3.4*   Recent Labs  Lab 12/21/17 1436  LIPASE 36   No results for input(s): AMMONIA in the last 168 hours. CBC: Recent Labs  Lab 12/21/17 1436 12/22/17 0457 12/23/17 0655  WBC 8.4 7.7 6.1  NEUTROABS 6.1 3.4  --   HGB 12.8 11.9* 12.0  HCT 37.4 34.9* 35.4*  MCV 79.9 79.5 80.1  PLT 219 207 194   Cardiac Enzymes: Recent Labs  Lab 12/22/17 0057 12/22/17 0457 12/22/17 0617  TROPONINI <0.03 <0.03 <0.03   BNP: Invalid input(s): POCBNP CBG: Recent Labs  Lab  12/22/17 0807 12/22/17 1227 12/22/17 1654 12/22/17 2145 12/23/17 0828  GLUCAP 167* 198* 414* 145* 216*   D-Dimer No results for input(s): DDIMER in the last 72 hours. Hgb A1c Recent Labs    12/22/17 0457  HGBA1C 9.9*    Lipid Profile Recent Labs    12/22/17 0457  CHOL 244*  HDL 41  LDLCALC 167*  TRIG 182*  CHOLHDL 6.0   Thyroid function studies No results for input(s): TSH, T4TOTAL, T3FREE, THYROIDAB in the last 72 hours.  Invalid input(s): FREET3 Anemia work up No results for input(s): VITAMINB12, FOLATE, FERRITIN, TIBC, IRON, RETICCTPCT in the last 72 hours. Urinalysis    Component Value Date/Time   COLORURINE STRAW (A) 03/20/2017 1211   APPEARANCEUR CLEAR 03/20/2017 1211   LABSPEC 1.012 03/20/2017 1211   PHURINE 5.0 03/20/2017 1211   GLUCOSEU NEGATIVE 03/20/2017 1211   HGBUR NEGATIVE 03/20/2017 1211   BILIRUBINUR NEGATIVE 03/20/2017 1211   KETONESUR NEGATIVE 03/20/2017 1211   PROTEINUR NEGATIVE 03/20/2017 1211   UROBILINOGEN 0.2 07/12/2013 1055   NITRITE NEGATIVE 03/20/2017 1211   LEUKOCYTESUR NEGATIVE 03/20/2017 1211   Sepsis Labs Invalid input(s): PROCALCITONIN,  WBC,  LACTICIDVEN Microbiology No results found for this or any previous visit (from the past 240 hour(s)).   Time coordinating discharge: Over 30 minutes  SIGNED:   Cristy Folks, MD  [facility] 12/23/2017, 11:45 AM   If 7PM-7AM, please contact night-coverage [email or web address] Password [email or web address]

## 2017-12-23 NOTE — Discharge Instructions (Signed)

## 2017-12-23 NOTE — Progress Notes (Addendum)
Progress Note  Patient Name: Brenda Moreno Date of Encounter: 12/23/2017  Primary Cardiologist: No primary care provider on file.   Subjective   She feel better today, no chest pain.  Inpatient Medications    Scheduled Meds: . amLODipine  5 mg Oral Daily  . enoxaparin (LOVENOX) injection  40 mg Subcutaneous Daily  . insulin aspart  0-15 Units Subcutaneous TID WC  . insulin aspart  0-5 Units Subcutaneous QHS  . insulin aspart protamine- aspart  15 Units Subcutaneous Q breakfast   And  . insulin aspart protamine- aspart  5 Units Subcutaneous Q supper  . metoprolol succinate  100 mg Oral Daily  . pravastatin  80 mg Oral q1800  . sodium chloride flush  3 mL Intravenous Q12H   Continuous Infusions: . sodium chloride     PRN Meds: sodium chloride, acetaminophen, gi cocktail, morphine injection, nitroGLYCERIN, ondansetron (ZOFRAN) IV, sodium chloride flush, traMADol   Vital Signs    Vitals:   12/22/17 1950 12/22/17 2330 12/23/17 0500 12/23/17 0840  BP: 139/60 128/65 (!) 153/64 (!) 161/68  Pulse: (!) 50 (!) 49 (!) 53 (!) 47  Resp: 18 18 18 16   Temp: 98.6 F (37 C) 98.3 F (36.8 C) 97.7 F (36.5 C)   TempSrc: Oral Oral Oral   SpO2: 97% 97% 97% 99%  Weight:      Height:        Intake/Output Summary (Last 24 hours) at 12/23/2017 0955 Last data filed at 12/23/2017 0516 Gross per 24 hour  Intake 480 ml  Output 800 ml  Net -320 ml   Filed Weights   12/21/17 1421  Weight: 182 lb (82.6 kg)   Telemetry    SB to SR - Personally Reviewed  Physical Exam   GEN: No acute distress.   Neck: No JVD Cardiac: RRR, no murmurs, rubs, or gallops.  Respiratory: Clear to auscultation bilaterally. GI: Soft, nontender, non-distended  MS: No edema; No deformity. Neuro:  Nonfocal  Psych: Normal affect   Labs    Chemistry Recent Labs  Lab 12/21/17 1436 12/22/17 0457 12/23/17 0655  NA 138 139 137  K 4.1 3.5 3.8  CL 105 108 104  CO2 21* 21* 25  GLUCOSE 238* 134* 174*    BUN 27* 24* 20  CREATININE 1.54* 1.29* 1.14*  CALCIUM 9.3 8.8* 8.6*  PROT 6.4*  --   --   ALBUMIN 3.4*  --   --   AST 21  --   --   ALT 15  --   --   ALKPHOS 80  --   --   BILITOT 0.6  --   --   GFRNONAA 33* 41* 48*  GFRAA 39* 48* 56*  ANIONGAP 12 10 8     Hematology Recent Labs  Lab 12/21/17 1436 12/22/17 0457 12/23/17 0655  WBC 8.4 7.7 6.1  RBC 4.68 4.39 4.42  HGB 12.8 11.9* 12.0  HCT 37.4 34.9* 35.4*  MCV 79.9 79.5 80.1  MCH 27.4 27.1 27.1  MCHC 34.2 34.1 33.9  RDW 17.3* 16.6* 17.0*  PLT 219 207 194    Cardiac Enzymes Recent Labs  Lab 12/22/17 0057 12/22/17 0457 12/22/17 0617  TROPONINI <0.03 <0.03 <0.03    Recent Labs  Lab 12/21/17 1443 12/21/17 2248  TROPIPOC 0.00 0.00    BNPNo results for input(s): BNP, PROBNP in the last 168 hours.   DDimer No results for input(s): DDIMER in the last 168 hours.   Radiology    Dg  Chest 2 View  Result Date: 12/21/2017 CLINICAL DATA:  Chest pain. EXAM: CHEST - 2 VIEW COMPARISON:  Radiographs of October 20, 2016. FINDINGS: The heart size and mediastinal contours are within normal limits. Both lungs are clear. Status post coronary artery bypass graft. No pneumothorax or pleural effusion is noted. The visualized skeletal structures are unremarkable. IMPRESSION: No active cardiopulmonary disease. Electronically Signed   By: Marijo Conception, M.D.   On: 12/21/2017 15:26    Cardiac Studies   LHC 12/22/17 1. Severe triple vessel CAD s/p 6V CABG with 5/6 patent grafts.  2. The LAD has a severe proximal stenosis. The LIMA graft to the mid LAD is patent and is seen to fill antegrade and also retrograde from native coronary injection. The vein grafts to both Diagonal branches are patent.  3. The Circumflex has moderate proximal stenosis. This vessel terminates into a small OM branch. The vein graft to the OM branch is known to be occluded.  4. The RCA has moderate mid stenosis and then severe stenosis in the proximal segments of  the PLA and PDA. The sequential vein graft to the PDA and PLA is patent.   Recommendations: No focal targets for PCI. The Circumflex stenosis appears to be moderate. This vessel is moderate in caliber. If she has angina that cannot be controlled with optimal medical therapy, could consider PCI of the Circumflex at another time. Continue medical therapy for CAD.   Patient Profile     72 y.o. female   [place]    1. Chest pain concerning for cardiac etiology -History of CABG in 1998.  No evaluation since then per records. -Her pain yesterday concerning for angina.   -on cath yesterday  No focal targets for PCI. The Circumflex stenosis appears to be moderate. This vessel is moderate in caliber. If she has angina that cannot be controlled with optimal medical therapy, could consider PCI of the Circumflex at another time. Continue medical therapy for CAD.  - chest pain free this am - I would add pantoprazole 20 mg po daily to her regimen at home  2.  Bilateral lower extremity edema -Likely due to excess salt intake.  Spent greater than 15 minutes discussing diet and regular exercise.  Pending echocardiogram.  No orthopnea or PND.  3.  Controlled diabetes -A1c 9.9.  Management per primary team.  She will benefit from nutritionist consult.   4.  Hyperlipidemia - 12/22/2017: Cholesterol 244; HDL 41; LDL Cholesterol 167; Triglycerides 182; VLDL 36  - LDL goal less than 70.  Statin listed as allergy.  Continue [name] 80 mg for now.  5.  AKI -Serum creatinine improved overnight. Now 1.1  6. Hypertension  - resume home quinalapril  Plan: discharge today, arrange for a follow up appointment with Dr Acie Fredrickson with next 2-3 weeks.   For questions or updates, please contact [place] Please consult [email or web address] for contact info under Cardiology/STEMI.      Signed, Ena Dawley, MD  12/23/2017, 9:55 AM

## 2017-12-25 ENCOUNTER — Encounter (HOSPITAL_COMMUNITY): Payer: Self-pay | Admitting: Cardiovascular Disease

## 2017-12-26 ENCOUNTER — Ambulatory Visit (INDEPENDENT_AMBULATORY_CARE_PROVIDER_SITE_OTHER): Payer: Medicare Other | Admitting: Family Medicine

## 2017-12-26 ENCOUNTER — Other Ambulatory Visit: Payer: Self-pay | Admitting: Family Medicine

## 2017-12-26 ENCOUNTER — Encounter: Payer: Self-pay | Admitting: Family Medicine

## 2017-12-26 ENCOUNTER — Telehealth: Payer: Self-pay | Admitting: Family Medicine

## 2017-12-26 VITALS — BP 126/72 | HR 70 | Temp 98.0°F | Resp 14 | Ht 64.0 in | Wt 185.0 lb

## 2017-12-26 DIAGNOSIS — I2510 Atherosclerotic heart disease of native coronary artery without angina pectoris: Secondary | ICD-10-CM

## 2017-12-26 DIAGNOSIS — E1122 Type 2 diabetes mellitus with diabetic chronic kidney disease: Secondary | ICD-10-CM

## 2017-12-26 DIAGNOSIS — N182 Chronic kidney disease, stage 2 (mild): Secondary | ICD-10-CM

## 2017-12-26 DIAGNOSIS — E7800 Pure hypercholesterolemia, unspecified: Secondary | ICD-10-CM | POA: Diagnosis not present

## 2017-12-26 DIAGNOSIS — M545 Low back pain, unspecified: Secondary | ICD-10-CM

## 2017-12-26 DIAGNOSIS — Z794 Long term (current) use of insulin: Secondary | ICD-10-CM

## 2017-12-26 DIAGNOSIS — E1165 Type 2 diabetes mellitus with hyperglycemia: Secondary | ICD-10-CM | POA: Diagnosis not present

## 2017-12-26 DIAGNOSIS — IMO0002 Reserved for concepts with insufficient information to code with codable children: Secondary | ICD-10-CM

## 2017-12-26 MED ORDER — PRAVASTATIN SODIUM 40 MG PO TABS: 40.0000 mg | ORAL_TABLET | Freq: Every day | ORAL | 3 refills | Status: DC

## 2017-12-26 MED ORDER — INSULIN NPH ISOPHANE & REGULAR (70-30) 100 UNIT/ML ~~LOC~~ SUSP: SUBCUTANEOUS | 11 refills | Status: DC

## 2017-12-26 MED ORDER — TRAMADOL HCL 50 MG PO TABS: 50.0000 mg | ORAL_TABLET | Freq: Three times a day (TID) | ORAL | 0 refills | Status: DC | PRN

## 2017-12-26 NOTE — Telephone Encounter (Signed)
Tramadol to [facility]

## 2017-12-26 NOTE — Telephone Encounter (Signed)
Med was sent to [facility]

## 2017-12-26 NOTE — Progress Notes (Signed)
Subjective:    Patient ID: Brenda Moreno, female    DOB: 03-09-1946, 72 y.o.   MRN: [id number]  HPI  Patient has not been seen since 04/2017. Was recently admitted with chest pain.  I have copied her discharge summary and included it below for my reference:  Admit date: 12/21/2017 Discharge date: 12/23/2017  Admitted From: Home Disposition:  Home  Recommendations for Outpatient Follow-up:  1. Follow up with PCP in 1-2 weeks 2. Please obtain BMP/CBC in one week 3. Please follow up on the following pending results: echo ordered 12/23/17  Brief/Interim Summary:  #) Chest pain with history of coronary artery disease status post CABG: Patient was admitted with chest pain that was equivocal for angina.  She had a cardiac catheterization on 12/22/2017 that showed no focal targets for PCI, prior CABG with 2 grafts down, stenosis of the circumflex that was moderate.  She did receive an echo on 12/23/2017 that is pending.  Patient was continued on her home metoprolol dose and was maintained on pravastatin 80 mg daily here.  She will restart her home statin on discharge. Cath report- 1. Severe triple vessel CAD s/p 6V CABG with 5/6 patent grafts.  2. The LAD has a severe proximal stenosis. The LIMA graft to the mid LAD is patent and is seen to fill antegrade and also retrograde from native coronary injection. The vein grafts to both Diagonal branches are patent.  3. The Circumflex has moderate proximal stenosis. This vessel terminates into a small OM branch. The vein graft to the OM branch is known to be occluded.  4. The RCA has moderate mid stenosis and then severe stenosis in the proximal segments of the PLA and PDA. The sequential vein graft to the PDA and PLA is patent.  Recommendations: No focal targets for PCI. The Circumflex stenosis appears to be moderate. This vessel is moderate in caliber. If she has angina that cannot be controlled with optimal medical therapy, could consider PCI of the  Circumflex at another time. Continue medical therapy for CAD.     #) Type 2 diabetes on insulin: A1c was 9.9.  She was maintained on her 70/30 and sliding scale insulin with meals.  Her home pioglitazone was held.  This was restarted on discharge.  She was told to increase her insulin dose with follow-up with her PCP.  #) Hypertension: Patient was continued on amlodipine and beta-blocker.  Her ACE inhibitor was held with cardiac catheterization and was restarted on discharge.  #) Chronic diastolic heart failure: Patient's diuretics were held pending cardiac catheterization and restarted on discharge.  #) Hyperlipidemia: Patient was continued on statin.  Discharge Diagnoses:  Principal Problem:   Chest pain Active Problems:   Diabetes mellitus (HCC)   Benign essential HTN   Hyperlipidemia   Coronary artery disease involving native coronary artery of native heart without angina pectoris   Was scheduled for CPE which I have deferred to discuss management of her diabetes which is out of control.   ECHO revealed:  Study Conclusions  - Left ventricle: The cavity size was normal. Systolic function was   normal. The estimated ejection fraction was in the range of 60%   to 65%. Wall motion was normal; there were no regional wall   motion abnormalities. Left ventricular diastolic function   parameters were normal. Doppler parameters are consistent with   high ventricular filling pressure. - Mitral valve: There was mild regurgitation. - Tricuspid valve: There was moderate regurgitation. - Pulmonic  valve: There was trivial regurgitation. - Pulmonary arteries: PA peak pressure: 48 mm Hg (S).  Impressions:  - The right ventricular systolic pressure was increased consistent   with moderate pulmonary hypertension.  12/26/17 I reviewed the patient's hospital results in detail today.  Her hemoglobin A1c was 9.9 suggesting an average sugar between 200-250.  Patient admits that she  has been using her insulin sporadically.  Apparently she was on NovoLog 70/30.  At [facility], this insulin was causing her anywhere between $150 and $300!Marland Kitchen  The time she was unable to afford the medication and therefore she would not use it.  Her LDL cholesterol was 167.  She was supposed to be on Livalo 4 mg a day due to her history of statin intolerance.  However this medication was costing her greater than $100 as well and she was unable to afford it and therefore she was only taking medication less than 30% of the time.  Therefore the 2 biggest contributing factors to her coronary artery disease were going uncontrolled mainly due to medication noncompliance due to cost.  Therefore I spent more than 30 minutes a day with the patient researching medication options in determining our future plan.  After calling [facility], Novolin N 70/30 is $24 roughly per month.  The patient can afford this.  Obviously pravastatin would be much cheaper than Livalo. Past Medical History:  Diagnosis Date  . Arthritis   . CAD (coronary artery disease)    S/P cabg  . Carotid artery occlusion   . Cataract   . CHF (congestive heart failure) ([place])   . DDD (degenerative disc disease), lumbar   . Diabetes mellitus   . Hyperlipidemia   . Hypertension   . Joint pain   . Leg pain   . Myocardial infarction ([place]) 1998  . Peripheral vascular disease ([place])   . PVD (peripheral vascular disease) ([place])    Past Surgical History:  Procedure Laterality Date  . APPENDECTOMY    . BREAST EXCISIONAL BIOPSY Left 2008  . CHOLECYSTECTOMY     Gall bladder  . COLONOSCOPY  2008  . CORONARY ARTERY BYPASS GRAFT     1998  . LEFT HEART CATH AND CORS/GRAFTS ANGIOGRAPHY N/A 12/22/2017   Procedure: LEFT HEART CATH AND CORS/GRAFTS ANGIOGRAPHY;  Surgeon: Burnell Blanks, MD;  Location: [place] [facility];  Service: Cardiovascular;  Laterality: N/A;  . PR VEIN BYPASS GRAFT,AORTO-FEM-POP    . TUBAL LIGATION     Current Outpatient Medications  on File Prior to Visit  Medication Sig Dispense Refill  . amLODipine (NORVASC) 5 MG tablet TAKE 1 TABLET BY MOUTH  DAILY 90 tablet 1  . Bioflavonoid Products (ESTER C PO) Take 1,000 mg by mouth daily.    Marland Kitchen CINNAMON PO Take 1,000 mg by mouth 2 (two) times daily.      . Coenzyme Q10 (CO Q 10 PO) Take 100 mg by mouth daily.      . furosemide (LASIX) 40 MG tablet Take 1 tablet (40 mg total) by mouth daily. (Patient taking differently: Take 20 mg by mouth daily. ) 90 tablet 3  . insulin aspart protamine- aspart (NOVOLOG MIX 70/30) (70-30) 100 UNIT/ML injection 15 u QAM 10 u QPM    . metFORMIN (GLUCOPHAGE) 1000 MG tablet TAKE 1 TABLET BY MOUTH  TWICE A DAY WITH MEALS 180 tablet 0  . nitroGLYCERIN (NITROSTAT) 0.4 MG SL tablet Place 1 tablet (0.4 mg total) under the tongue every 5 (five) minutes as needed for chest  pain. 90 tablet 2  . pioglitazone (ACTOS) 30 MG tablet TAKE 1 TABLET BY MOUTH  DAILY (Patient taking differently: TAKE 1 TABLET BY MOUTH  in the evenng) 90 tablet 0  . Pitavastatin Calcium (LIVALO) 4 MG TABS Take 1 tablet (4 mg total) by mouth daily. (Patient taking differently: Take 1 tablet by mouth at bedtime. ) 90 tablet 0  . quinapril (ACCUPRIL) 40 MG tablet TAKE 1 TABLET BY MOUTH TWO  TIMES DAILY 180 tablet 0  . traMADol (ULTRAM) 50 MG tablet Take 1 tablet (50 mg total) by mouth every 8 (eight) hours as needed. 60 tablet 0  . metoprolol succinate (TOPROL-XL) 100 MG 24 hr tablet Take 1 tablet by mouth  daily with or immediatley  following a meal (Patient not taking: Reported on 12/26/2017) 90 tablet 4  . potassium chloride (K-DUR) 10 MEQ tablet Take 1 tablet (10 mEq total) by mouth daily. 30 tablet 11   No current facility-administered medications on file prior to visit.    Allergies  Allergen Reactions  . Levaquin [Levofloxacin In D5w] Other (See Comments)    Pain all over and in joints  . Statins Other (See Comments)    Severe myalgias to lipitor, crestor, pravastatin and weakness  and cramping  in bilateral leg.  [name] [Linagliptin] Itching   Social History   Socioeconomic History  . Marital status: Divorced    Spouse name: Not on file  . Number of children: Not on file  . Years of education: Not on file  . Highest education level: Not on file  Occupational History  . Not on file  Social Needs  . Financial resource strain: Not on file  . Food insecurity:    Worry: Not on file    Inability: Not on file  . Transportation needs:    Medical: Not on file    Non-medical: Not on file  Tobacco Use  . Smoking status: Former Smoker    Packs/day: 0.25    Types: Cigarettes    Last attempt to quit: 09/13/2011    Years since quitting: 6.2  . Smokeless tobacco: Never Used  Substance and Sexual Activity  . Alcohol use: No  . Drug use: No  . Sexual activity: Not on file  Lifestyle  . Physical activity:    Days per week: Not on file    Minutes per session: Not on file  . Stress: Not on file  Relationships  . Social connections:    Talks on phone: Not on file    Gets together: Not on file    Attends religious service: Not on file    Active member of club or organization: Not on file    Attends meetings of clubs or organizations: Not on file    Relationship status: Not on file  . Intimate partner violence:    Fear of current or ex partner: Not on file    Emotionally abused: Not on file    Physically abused: Not on file    Forced sexual activity: Not on file  Other Topics Concern  . Not on file  Social History Narrative  . Not on file    Review of Systems  All other systems reviewed and are negative.      Objective:   Physical Exam  Constitutional: She appears well-developed.  Cardiovascular: Normal rate, regular rhythm, normal heart sounds and intact distal pulses. Exam reveals no gallop and no friction rub.  No murmur heard. Pulmonary/Chest: Effort normal and breath  sounds normal. No stridor. No respiratory distress. She has no wheezes. She  has no rales.  Abdominal: Soft. Bowel sounds are normal.  Musculoskeletal: She exhibits no edema.  Vitals reviewed.         Assessment & Plan:  Uncontrolled type 2 diabetes mellitus with stage 2 chronic kidney disease, with long-term current use of insulin (HCC)  Coronary artery disease involving native coronary artery of native heart without angina pectoris  Pure hypercholesterolemia  Discontinue NovoLog 70/30 and replaced with Novolin 70/30, 15 units in the morning and 10 units in the evening.  Patient was given a handwritten prescription to take to [facility] where she can buy this medication cash price for 1/5 of what they were charging at her local pharmacy.  I want her to record her fasting and 2-hour postprandial sugars and return in 1 week so we can titrate her insulin further to achieve adequate glycemic control.  Also discontinue Livalo.  Despite the fact the patient can tolerate this medication she is unable to afford it and therefore it is not providing adequate control of her cholesterol.  Replaced with pravastatin 40 mg a day and reassess fasting lipid panel in 3 months.

## 2018-01-02 ENCOUNTER — Ambulatory Visit (INDEPENDENT_AMBULATORY_CARE_PROVIDER_SITE_OTHER): Payer: Medicare Other | Admitting: Family Medicine

## 2018-01-02 ENCOUNTER — Encounter: Payer: Self-pay | Admitting: Family Medicine

## 2018-01-02 VITALS — BP 120/70 | HR 64 | Temp 97.7°F | Resp 16 | Ht 64.0 in | Wt 185.0 lb

## 2018-01-02 DIAGNOSIS — E1165 Type 2 diabetes mellitus with hyperglycemia: Secondary | ICD-10-CM

## 2018-01-02 DIAGNOSIS — Z794 Long term (current) use of insulin: Secondary | ICD-10-CM | POA: Diagnosis not present

## 2018-01-02 DIAGNOSIS — N182 Chronic kidney disease, stage 2 (mild): Secondary | ICD-10-CM | POA: Diagnosis not present

## 2018-01-02 DIAGNOSIS — IMO0002 Reserved for concepts with insufficient information to code with codable children: Secondary | ICD-10-CM

## 2018-01-02 DIAGNOSIS — E1122 Type 2 diabetes mellitus with diabetic chronic kidney disease: Secondary | ICD-10-CM | POA: Diagnosis not present

## 2018-01-02 NOTE — Progress Notes (Signed)
Subjective:    Patient ID: Brenda Moreno, female    DOB: 02-27-1946, 72 y.o.   MRN: [id number]  HPI  Patient has not been seen since 04/2017. Was recently admitted with chest pain.  I have copied her discharge summary and included it below for my reference:  Admit date: 12/21/2017 Discharge date: 12/23/2017  Admitted From: Home Disposition:  Home  Recommendations for Outpatient Follow-up:  1. Follow up with PCP in 1-2 weeks 2. Please obtain BMP/CBC in one week 3. Please follow up on the following pending results: echo ordered 12/23/17  Brief/Interim Summary:  #) Chest pain with history of coronary artery disease status post CABG: Patient was admitted with chest pain that was equivocal for angina.  She had a cardiac catheterization on 12/22/2017 that showed no focal targets for PCI, prior CABG with 2 grafts down, stenosis of the circumflex that was moderate.  She did receive an echo on 12/23/2017 that is pending.  Patient was continued on her home metoprolol dose and was maintained on pravastatin 80 mg daily here.  She will restart her home statin on discharge. Cath report- 1. Severe triple vessel CAD s/p 6V CABG with 5/6 patent grafts.  2. The LAD has a severe proximal stenosis. The LIMA graft to the mid LAD is patent and is seen to fill antegrade and also retrograde from native coronary injection. The vein grafts to both Diagonal branches are patent.  3. The Circumflex has moderate proximal stenosis. This vessel terminates into a small OM branch. The vein graft to the OM branch is known to be occluded.  4. The RCA has moderate mid stenosis and then severe stenosis in the proximal segments of the PLA and PDA. The sequential vein graft to the PDA and PLA is patent.  Recommendations: No focal targets for PCI. The Circumflex stenosis appears to be moderate. This vessel is moderate in caliber. If she has angina that cannot be controlled with optimal medical therapy, could consider PCI of the  Circumflex at another time. Continue medical therapy for CAD.     #) Type 2 diabetes on insulin: A1c was 9.9.  She was maintained on her 70/30 and sliding scale insulin with meals.  Her home pioglitazone was held.  This was restarted on discharge.  She was told to increase her insulin dose with follow-up with her PCP.  #) Hypertension: Patient was continued on amlodipine and beta-blocker.  Her ACE inhibitor was held with cardiac catheterization and was restarted on discharge.  #) Chronic diastolic heart failure: Patient's diuretics were held pending cardiac catheterization and restarted on discharge.  #) Hyperlipidemia: Patient was continued on statin.  Discharge Diagnoses:  Principal Problem:   Chest pain Active Problems:   Diabetes mellitus (HCC)   Benign essential HTN   Hyperlipidemia   Coronary artery disease involving native coronary artery of native heart without angina pectoris   Was scheduled for CPE which I have deferred to discuss management of her diabetes which is out of control.   ECHO revealed:  Study Conclusions  - Left ventricle: The cavity size was normal. Systolic function was   normal. The estimated ejection fraction was in the range of 60%   to 65%. Wall motion was normal; there were no regional wall   motion abnormalities. Left ventricular diastolic function   parameters were normal. Doppler parameters are consistent with   high ventricular filling pressure. - Mitral valve: There was mild regurgitation. - Tricuspid valve: There was moderate regurgitation. - Pulmonic  valve: There was trivial regurgitation. - Pulmonary arteries: PA peak pressure: 48 mm Hg (S).  Impressions:  - The right ventricular systolic pressure was increased consistent   with moderate pulmonary hypertension.  12/26/17 I reviewed the patient's hospital results in detail today.  Her hemoglobin A1c was 9.9 suggesting an average sugar between 200-250.  Patient admits that she  has been using her insulin sporadically.  Apparently she was on NovoLog 70/30.  [facility], this insulin was causing her anywhere between $150 and $300!Marland Kitchen  The time she was unable to afford the medication and therefore she would not use it.  Her LDL cholesterol was 167.  She was supposed to be on Livalo 4 mg a day due to her history of statin intolerance.  However this medication was costing her greater than $100 as well and she was unable to afford it and therefore she was only taking medication less than 30% of the time.  Therefore the 2 biggest contributing factors to her coronary artery disease were going uncontrolled mainly due to medication noncompliance due to cost.  Therefore I spent more than 30 minutes a day with the patient researching medication options in determining our future plan.  After calling [facility], Novolin N 70/30 is $24 roughly per month.  The patient can afford this.  Obviously pravastatin would be much cheaper than Livalo.  At that time, my plan was: Discontinue NovoLog 70/30 and replaced with Novolin 70/30, 15 units in the morning and 10 units in the evening.  Patient was given a handwritten prescription to take to [facility] where she can buy this medication cash price for 1/5 of what they were charging at her local pharmacy.  I want her to record her fasting and 2-hour postprandial sugars and return in 1 week so we can titrate her insulin further to achieve adequate glycemic control.  Also discontinue Livalo.  Despite the fact the patient can tolerate this medication she is unable to afford it and therefore it is not providing adequate control of her cholesterol.  Replaced with pravastatin 40 mg a day and reassess fasting lipid panel in 3 months.  Patient is here today to recheck her sugars after we made the change in her insulin.  She brings in 7 days of values.  Her fasting sugars in the morning are all well controlled.  They range between 105 and 124.  There are no episodes of hypoglycemia.   Her evening sugars are highly variable.  3 days it was greater than 300.  However 2 days it was 140 and 173.  When I asked her the difference, the patient states that she forgets to take her evening insulin with supper and then when she checks her sugar prior to bedtime her sugar is elevated.  The 2 days that her sugar was less than 200, she had remember to take her 7 units with dinner.  The days when it was greater than 300, she had forgotten to take her insulin with supper.  Therefore her hyperglycemia in the evening seems to be result of forgetting to take her insulin Past Medical History:  Diagnosis Date  . Arthritis   . CAD (coronary artery disease)    S/P cabg  . Carotid artery occlusion   . Cataract   . CHF (congestive heart failure) ([place])   . DDD (degenerative disc disease), lumbar   . Diabetes mellitus   . Hyperlipidemia   . Hypertension   . Joint pain   . Leg pain   .  Myocardial infarction ([place]) 1998  . Peripheral vascular disease ([place])   . PVD (peripheral vascular disease) ([place])    Past Surgical History:  Procedure Laterality Date  . APPENDECTOMY    . BREAST EXCISIONAL BIOPSY Left 2008  . CHOLECYSTECTOMY     Gall bladder  . COLONOSCOPY  2008  . CORONARY ARTERY BYPASS GRAFT     1998  . LEFT HEART CATH AND CORS/GRAFTS ANGIOGRAPHY N/A 12/22/2017   Procedure: LEFT HEART CATH AND CORS/GRAFTS ANGIOGRAPHY;  Surgeon: Burnell Blanks, MD;  Location: [place] [facility];  Service: Cardiovascular;  Laterality: N/A;  . PR VEIN BYPASS GRAFT,AORTO-FEM-POP    . TUBAL LIGATION     Current Outpatient Medications on File Prior to Visit  Medication Sig Dispense Refill  . amLODipine (NORVASC) 5 MG tablet TAKE 1 TABLET BY MOUTH  DAILY 90 tablet 1  . Bioflavonoid Products (ESTER C PO) Take 1,000 mg by mouth daily.    Marland Kitchen CINNAMON PO Take 1,000 mg by mouth 2 (two) times daily.      . Coenzyme Q10 (CO Q 10 PO) Take 100 mg by mouth daily.      . furosemide (LASIX) 40 MG tablet Take 1  tablet (40 mg total) by mouth daily. (Patient taking differently: Take 20 mg by mouth daily. ) 90 tablet 3  . insulin NPH-regular Human (NOVOLIN 70/30) (70-30) 100 UNIT/ML injection 15 units with breakfast and 7 units with supper 10 mL 11  . metFORMIN (GLUCOPHAGE) 1000 MG tablet TAKE 1 TABLET BY MOUTH  TWICE A DAY WITH MEALS 180 tablet 0  . metoprolol succinate (TOPROL-XL) 100 MG 24 hr tablet TAKE 1 TABLET BY MOUTH  DAILY WITH OR IMMEDIATLEY  FOLLOWING A MEAL 90 tablet 4  . nitroGLYCERIN (NITROSTAT) 0.4 MG SL tablet Place 1 tablet (0.4 mg total) under the tongue every 5 (five) minutes as needed for chest pain. 90 tablet 2  . pioglitazone (ACTOS) 30 MG tablet TAKE 1 TABLET BY MOUTH  DAILY (Patient taking differently: TAKE 1 TABLET BY MOUTH  in the evenng) 90 tablet 0  . Pitavastatin Calcium (LIVALO) 4 MG TABS Take 1 tablet (4 mg total) by mouth daily. (Patient taking differently: Take 1 tablet by mouth at bedtime. ) 90 tablet 0  . pravastatin ([name]) 40 MG tablet Take 1 tablet (40 mg total) by mouth daily. 90 tablet 3  . quinapril (ACCUPRIL) 40 MG tablet TAKE 1 TABLET BY MOUTH TWO  TIMES DAILY 180 tablet 0  . traMADol (ULTRAM) 50 MG tablet Take 1 tablet (50 mg total) by mouth every 8 (eight) hours as needed. 60 tablet 0  . potassium chloride (K-DUR) 10 MEQ tablet Take 1 tablet (10 mEq total) by mouth daily. 30 tablet 11   No current facility-administered medications on file prior to visit.    Allergies  Allergen Reactions  . Levaquin [Levofloxacin In D5w] Other (See Comments)    Pain all over and in joints  . Statins Other (See Comments)    Severe myalgias to lipitor, crestor, pravastatin and weakness and cramping  in bilateral leg.  [name] [Linagliptin] Itching   Social History   Socioeconomic History  . Marital status: Divorced    Spouse name: Not on file  . Number of children: Not on file  . Years of education: Not on file  . Highest education level: Not on file  Occupational  History  . Not on file  Social Needs  . Financial resource strain: Not on file  .  Food insecurity:    Worry: Not on file    Inability: Not on file  . Transportation needs:    Medical: Not on file    Non-medical: Not on file  Tobacco Use  . Smoking status: Former Smoker    Packs/day: 0.25    Types: Cigarettes    Last attempt to quit: 09/13/2011    Years since quitting: 6.3  . Smokeless tobacco: Never Used  Substance and Sexual Activity  . Alcohol use: No  . Drug use: No  . Sexual activity: Not on file  Lifestyle  . Physical activity:    Days per week: Not on file    Minutes per session: Not on file  . Stress: Not on file  Relationships  . Social connections:    Talks on phone: Not on file    Gets together: Not on file    Attends religious service: Not on file    Active member of club or organization: Not on file    Attends meetings of clubs or organizations: Not on file    Relationship status: Not on file  . Intimate partner violence:    Fear of current or ex partner: Not on file    Emotionally abused: Not on file    Physically abused: Not on file    Forced sexual activity: Not on file  Other Topics Concern  . Not on file  Social History Narrative  . Not on file    Review of Systems  All other systems reviewed and are negative.      Objective:   Physical Exam  Constitutional: She appears well-developed.  Cardiovascular: Normal rate, regular rhythm, normal heart sounds and intact distal pulses. Exam reveals no gallop and no friction rub.  No murmur heard. Pulmonary/Chest: Effort normal and breath sounds normal. No stridor. No respiratory distress. She has no wheezes. She has no rales.  Abdominal: Soft. Bowel sounds are normal.  Musculoskeletal: She exhibits no edema.  Vitals reviewed.         Assessment & Plan:  Uncontrolled type 2 diabetes mellitus with stage 2 chronic kidney disease, with long-term current use of insulin (HCC) The 2 days when she took  her evening insulin, her evening blood sugars prior to bedtime are outstanding.  Her morning sugars are always excellent.  It seems as though when the patient is compliant with her insulin, her sugars are well controlled.  Therefore I will not make any changes in her insulin.  I will encourage the patient to be compliant taking her insulin twice daily and then recheck fasting blood sugars and 2-hour postprandial sugars in 1 week when she comes in with her brother for his hospital follow-up.

## 2018-01-08 ENCOUNTER — Encounter: Payer: Self-pay | Admitting: Cardiology

## 2018-01-08 ENCOUNTER — Ambulatory Visit: Payer: Medicare Other | Admitting: Cardiology

## 2018-01-08 ENCOUNTER — Encounter (INDEPENDENT_AMBULATORY_CARE_PROVIDER_SITE_OTHER): Payer: Self-pay

## 2018-01-08 VITALS — BP 136/66 | HR 57 | Ht 64.0 in | Wt 185.2 lb

## 2018-01-08 DIAGNOSIS — I25810 Atherosclerosis of coronary artery bypass graft(s) without angina pectoris: Secondary | ICD-10-CM | POA: Diagnosis not present

## 2018-01-08 DIAGNOSIS — E782 Mixed hyperlipidemia: Secondary | ICD-10-CM

## 2018-01-08 DIAGNOSIS — I10 Essential (primary) hypertension: Secondary | ICD-10-CM | POA: Diagnosis not present

## 2018-01-08 DIAGNOSIS — I5032 Chronic diastolic (congestive) heart failure: Secondary | ICD-10-CM | POA: Diagnosis not present

## 2018-01-08 DIAGNOSIS — Z794 Long term (current) use of insulin: Secondary | ICD-10-CM

## 2018-01-08 DIAGNOSIS — E119 Type 2 diabetes mellitus without complications: Secondary | ICD-10-CM | POA: Diagnosis not present

## 2018-01-08 MED ORDER — PRAVASTATIN SODIUM 80 MG PO TABS: 80.0000 mg | ORAL_TABLET | Freq: Every evening | ORAL | 3 refills | Status: DC

## 2018-01-08 NOTE — Patient Instructions (Signed)
Medication Instructions: Your physician has recommended you make the following change in your medication:  START: Pravastatin 80 mg taking 1 tablet daily    Labwork: Lipids & Lft's IN 8 WEEKS   Procedures/Testing: None Ordered  Follow-Up: Your physician recommends that you schedule a follow-up appointment in: 6 months with Dr.Nahser    Any Additional Special Instructions Will Be Listed Below (If Applicable).   DASH Eating Plan DASH stands for "Dietary Approaches to Stop Hypertension." The DASH eating plan is a healthy eating plan that has been shown to reduce high blood pressure (hypertension). It may also reduce your risk for type 2 diabetes, heart disease, and stroke. The DASH eating plan may also help with weight loss. What are tips for following this plan? General guidelines  Avoid eating more than 2,300 mg (milligrams) of salt (sodium) a day. If you have hypertension, you may need to reduce your sodium intake to 1,500 mg a day.  Limit alcohol intake to no more than 1 drink a day for nonpregnant women and 2 drinks a day for men. One drink equals 12 oz of beer, 5 oz of wine, or 1 oz of hard liquor.  Work with your health care provider to maintain a healthy body weight or to lose weight. Ask what an ideal weight is for you.  Get at least 30 minutes of exercise that causes your heart to beat faster (aerobic exercise) most days of the week. Activities may include walking, swimming, or biking.  Work with your health care provider or diet and nutrition specialist (dietitian) to adjust your eating plan to your individual calorie needs. Reading food labels  Check food labels for the amount of sodium per serving. Choose foods with less than 5 percent of the Daily Value of sodium. Generally, foods with less than 300 mg of sodium per serving fit into this eating plan.  To find whole grains, look for the word "whole" as the first word in the ingredient list. Shopping  Buy products  labeled as "low-sodium" or "no salt added."  Buy fresh foods. Avoid canned foods and premade or frozen meals. Cooking  Avoid adding salt when cooking. Use salt-free seasonings or herbs instead of table salt or sea salt. Check with your health care provider or pharmacist before using salt substitutes.  Do not fry foods. Cook foods using healthy methods such as baking, boiling, grilling, and broiling instead.  Cook with heart-healthy oils, such as olive, canola, soybean, or sunflower oil. Meal planning   Eat a balanced diet that includes: ? 5 or more servings of fruits and vegetables each day. At each meal, try to fill half of your plate with fruits and vegetables. ? Up to 6-8 servings of whole grains each day. ? Less than 6 oz of lean meat, poultry, or fish each day. A 3-oz serving of meat is about the same size as a deck of cards. One egg equals 1 oz. ? 2 servings of low-fat dairy each day. ? A serving of nuts, seeds, or beans 5 times each week. ? Heart-healthy fats. Healthy fats called Omega-3 fatty acids are found in foods such as flaxseeds and coldwater fish, like sardines, salmon, and mackerel.  Limit how much you eat of the following: ? Canned or prepackaged foods. ? Food that is high in trans fat, such as fried foods. ? Food that is high in saturated fat, such as fatty meat. ? Sweets, desserts, sugary drinks, and other foods with added sugar. ? Full-fat dairy products.  Do not salt foods before eating.  Try to eat at least 2 vegetarian meals each week.  Eat more home-cooked food and less restaurant, buffet, and fast food.  When eating at a restaurant, ask that your food be prepared with less salt or no salt, if possible. What foods are recommended? The items listed may not be a complete list. Talk with your dietitian about what dietary choices are best for you. Grains Whole-grain or whole-wheat bread. Whole-grain or whole-wheat pasta. Brown rice. [place]. Bulgur.  Whole-grain and low-sodium cereals. Pita bread. Low-fat, low-sodium crackers. Whole-wheat flour tortillas. Vegetables Fresh or frozen vegetables (raw, steamed, roasted, or grilled). Low-sodium or reduced-sodium tomato and vegetable juice. Low-sodium or reduced-sodium tomato sauce and tomato paste. Low-sodium or reduced-sodium canned vegetables. Fruits All fresh, dried, or frozen fruit. Canned fruit in natural juice (without added sugar). Meat and other protein foods Skinless chicken or [place]. Ground chicken or [place]. Pork with fat trimmed off. Fish and seafood. Egg whites. Dried beans, peas, or lentils. Unsalted nuts, nut butters, and seeds. Unsalted canned beans. Lean cuts of beef with fat trimmed off. Low-sodium, lean deli meat. Dairy Low-fat (1%) or fat-free (skim) milk. Fat-free, low-fat, or reduced-fat cheeses. Nonfat, low-sodium ricotta or cottage cheese. Low-fat or nonfat yogurt. Low-fat, low-sodium cheese. Fats and oils Soft margarine without trans fats. Vegetable oil. Low-fat, reduced-fat, or light mayonnaise and salad dressings (reduced-sodium). Canola, safflower, olive, soybean, and sunflower oils. Avocado. Seasoning and other foods Herbs. Spices. Seasoning mixes without salt. Unsalted popcorn and pretzels. Fat-free sweets. What foods are not recommended? The items listed may not be a complete list. Talk with your dietitian about what dietary choices are best for you. Grains Baked goods made with fat, such as croissants, muffins, or some breads. Dry pasta or rice meal packs. Vegetables Creamed or fried vegetables. Vegetables in a cheese sauce. Regular canned vegetables (not low-sodium or reduced-sodium). Regular canned tomato sauce and paste (not low-sodium or reduced-sodium). Regular tomato and vegetable juice (not low-sodium or reduced-sodium). Angie Fava. Olives. Fruits Canned fruit in a light or heavy syrup. Fried fruit. Fruit in cream or butter sauce. Meat and other protein  foods Fatty cuts of meat. Ribs. Fried meat. Berniece Salines. Sausage. Bologna and other processed lunch meats. Salami. Fatback. Hotdogs. Bratwurst. Salted nuts and seeds. Canned beans with added salt. Canned or smoked fish. Whole eggs or egg yolks. Chicken or [place] with skin. Dairy Whole or 2% milk, cream, and half-and-half. Whole or full-fat cream cheese. Whole-fat or sweetened yogurt. Full-fat cheese. Nondairy creamers. Whipped toppings. Processed cheese and cheese spreads. Fats and oils Butter. Stick margarine. Lard. Shortening. Ghee. Bacon fat. Tropical oils, such as coconut, palm kernel, or palm oil. Seasoning and other foods Salted popcorn and pretzels. Onion salt, garlic salt, seasoned salt, table salt, and sea salt. Worcestershire sauce. Tartar sauce. Barbecue sauce. Teriyaki sauce. Soy sauce, including reduced-sodium. Steak sauce. Canned and packaged gravies. Fish sauce. Oyster sauce. Cocktail sauce. Horseradish that you find on the shelf. Ketchup. Mustard. Meat flavorings and tenderizers. Bouillon cubes. Hot sauce and Tabasco sauce. Premade or packaged marinades. Premade or packaged taco seasonings. Relishes. Regular salad dressings. Where to find more information:  National Heart, Lung, and [place]: https://wilson-eaton.com/  American Heart Association: www.heart.org Summary  The DASH eating plan is a healthy eating plan that has been shown to reduce high blood pressure (hypertension). It may also reduce your risk for type 2 diabetes, heart disease, and stroke.  With the DASH eating plan, you should limit salt (sodium)  intake to 2,300 mg a day. If you have hypertension, you may need to reduce your sodium intake to 1,500 mg a day.  When on the DASH eating plan, aim to eat more fresh fruits and vegetables, whole grains, lean proteins, low-fat dairy, and heart-healthy fats.  Work with your health care provider or diet and nutrition specialist (dietitian) to adjust your eating plan to your  individual calorie needs. This information is not intended to replace advice given to you by your health care provider. Make sure you discuss any questions you have with your health care provider. Document Released: 08/04/2011 Document Revised: 08/08/2016 Document Reviewed: 08/08/2016 Elsevier Interactive Patient Education  [employer].    If you need a refill on your cardiac medications before your next appointment, please call your pharmacy.

## 2018-01-08 NOTE — Progress Notes (Signed)
Cardiology Office Note:    Date:  01/08/2018   ID:  [name], DOB 06/13/1946, MRN [id number]  PCP:  Susy Frizzle, MD  Cardiologist:  Mertie Moores, MD  Referring MD: Susy Frizzle, MD   Chief Complaint  Patient presents with  . Follow-up    cath    History of Present Illness:    Brenda Moreno is a 72 y.o. female with a past medical history significant for CAD S/P CABG 1998, hypertension, hyperlipidemia, diabetes type 2, PVD S/P and carotid occlusion right iliac stenting (followed by vascular) and carotid occlusion.   Brenda Moreno was admitted to the hospital on 12/21/2017 for evaluation of chest pain with associated diaphoresis, nausea and vomiting.  The patient thought this was due to GI upset and drank ginger ale with improvement.  However, her chest pressure recurred while walking at the mall in [place].  There was no radiation of pain.  During her prior angina in 1998, she never had any chest pain but had a severe pain between shoulder blades.  In the ER her symptoms improved with GI cocktail but then reoccurred.  Troponins were negative and she had no significant EKG changes.    Symptoms were concerning for ACS and the patient was taken for cardiac catheterization to redefine coronary anatomy.  This showed 5 of her 6 grafts being patent.  Moderate stenosis of the circumflex.  There were no focal targets for PCI.  It was noted that if she has angina that cannot be controlled with optimal medical therapy, could consider PCI of the circumflex at another time.  She was continued on medical therapy for CAD.  Pantoprazole 20 mgs was added.  The patient had kidney injury but her creatinine was down to 1.1 on discharge.  Today Brenda Moreno is here alone for follow up after her cath. She has had not chest discomfort or dyspnea since the hospitalization. No orthopnea, PND. She has mild ankle edema that she says is normal for her. She is taking lasix 20 mg daily. She has had no exertional  symptoms although she doe not do much exertion.   She states that her son is really working with her on reducing her sodium intake.    Past Medical History:  Diagnosis Date  . Arthritis   . CAD (coronary artery disease)    S/P cabg  . Carotid artery occlusion   . Cataract   . CHF (congestive heart failure) ([place])   . DDD (degenerative disc disease), lumbar   . Diabetes mellitus   . Hyperlipidemia   . Hypertension   . Joint pain   . Leg pain   . Myocardial infarction ([place]) 1998  . Peripheral vascular disease ([place])   . PVD (peripheral vascular disease) ([place])     Past Surgical History:  Procedure Laterality Date  . APPENDECTOMY    . BREAST EXCISIONAL BIOPSY Left 2008  . CHOLECYSTECTOMY     Gall bladder  . COLONOSCOPY  2008  . CORONARY ARTERY BYPASS GRAFT     1998  . LEFT HEART CATH AND CORS/GRAFTS ANGIOGRAPHY N/A 12/22/2017   Procedure: LEFT HEART CATH AND CORS/GRAFTS ANGIOGRAPHY;  Surgeon: Burnell Blanks, MD;  Location: [facility];  Service: Cardiovascular;  Laterality: N/A;  . PR VEIN BYPASS GRAFT,AORTO-FEM-POP    . TUBAL LIGATION      Current Medications: Current Meds  Medication Sig  . amLODipine (NORVASC) 5 MG tablet TAKE 1 TABLET BY MOUTH  DAILY  .  aspirin EC 81 MG tablet Take 81 mg by mouth daily.  Marland Kitchen CINNAMON PO Take 1,000 mg by mouth 2 (two) times daily.    . Coenzyme Q10 (CO Q 10 PO) Take 100 mg by mouth daily.    . furosemide (LASIX) 40 MG tablet Take 20 mg by mouth daily. Patient taking 0.5 tablets po qd.  . insulin NPH-regular Human (NOVOLIN 70/30) (70-30) 100 UNIT/ML injection 15 units with breakfast and 7 units with supper  . metFORMIN (GLUCOPHAGE) 1000 MG tablet TAKE 1 TABLET BY MOUTH  TWICE A DAY WITH MEALS  . metoprolol succinate (TOPROL-XL) 100 MG 24 hr tablet TAKE 1 TABLET BY MOUTH  DAILY WITH OR IMMEDIATLEY  FOLLOWING A MEAL  . nitroGLYCERIN (NITROSTAT) 0.4 MG SL tablet Place 1 tablet (0.4 mg total) under the tongue every 5 (five)  minutes as needed for chest pain.  . pioglitazone (ACTOS) 30 MG tablet Take 30 mg by mouth as directed.  . potassium chloride (K-DUR) 10 MEQ tablet Take 1 tablet (10 mEq total) by mouth daily.  . quinapril (ACCUPRIL) 20 MG tablet Take 20 mg by mouth 2 (two) times daily.  . traMADol (ULTRAM) 50 MG tablet Take 1 tablet (50 mg total) by mouth every 8 (eight) hours as needed.  . [DISCONTINUED] Pitavastatin Calcium (LIVALO) 4 MG TABS Take 1 tablet by mouth as directed.  . [DISCONTINUED] quinapril (ACCUPRIL) 40 MG tablet TAKE 1 TABLET BY MOUTH TWO  TIMES DAILY     Allergies:   Levaquin [levofloxacin in d5w]; Statins; and Tradjenta [linagliptin]   Social History   Socioeconomic History  . Marital status: Divorced    Spouse name: Not on file  . Number of children: Not on file  . Years of education: Not on file  . Highest education level: Not on file  Occupational History  . Not on file  Social Needs  . Financial resource strain: Not on file  . Food insecurity:    Worry: Not on file    Inability: Not on file  . Transportation needs:    Medical: Not on file    Non-medical: Not on file  Tobacco Use  . Smoking status: Former Smoker    Packs/day: 0.25    Types: Cigarettes    Last attempt to quit: 09/13/2011    Years since quitting: 6.3  . Smokeless tobacco: Never Used  Substance and Sexual Activity  . Alcohol use: No  . Drug use: No  . Sexual activity: Not on file  Lifestyle  . Physical activity:    Days per week: Not on file    Minutes per session: Not on file  . Stress: Not on file  Relationships  . Social connections:    Talks on phone: Not on file    Gets together: Not on file    Attends religious service: Not on file    Active member of club or organization: Not on file    Attends meetings of clubs or organizations: Not on file    Relationship status: Not on file  Other Topics Concern  . Not on file  Social History Narrative  . Not on file     Family History: The  patient's family history includes Coronary artery disease in her other; Diabetes in her brother, brother, father, mother, and sister; Heart attack in her sister; Heart disease in her father, mother, and sister; Heart disease (age of onset: 84) in her brother; Hyperlipidemia in her brother, father, and mother; Hypertension in her brother,  father, mother, and sister; Other in her sister. There is no history of Colon cancer, Stomach cancer, or Esophageal cancer. ROS:   Please see the history of present illness.     All other systems reviewed and are negative.  EKGs/Labs/Other Studies Reviewed:    The following studies were reviewed today:  LHC 12/22/17 1. Severe triple vessel CAD s/p 6V CABG with 5/6 patent grafts.  There was 2. The LAD has a severe proximal stenosis. The LIMA graft to the mid LAD is patent and is seen to fill antegrade and also retrograde from native coronary injection. The vein grafts to both Diagonal branches are patent.  3. The Circumflex has moderate proximal stenosis. This vessel terminates into a small OM branch. The vein graft to the OM branch is known to be occluded.  4. The RCA has moderate mid stenosis and then severe stenosis in the proximal segments of the PLA and PDA. The sequential vein graft to the PDA and PLA is patent.   Recommendations: No focal targets for PCI. The Circumflex stenosis appears to be moderate. This vessel is moderate in caliber. If she has angina that cannot be controlled with optimal medical therapy, could consider PCI of the Circumflex at another time. Continue medical therapy for CAD.   Echocardiogram 12/23/2017 Study Conclusions - Left ventricle: The cavity size was normal. Systolic function was   normal. The estimated ejection fraction was in the range of 60%   to 65%. Wall motion was normal; there were no regional wall   motion abnormalities. Left ventricular diastolic function   parameters were normal. Doppler parameters are consistent  with   high ventricular filling pressure. - Mitral valve: There was mild regurgitation. - Tricuspid valve: There was moderate regurgitation. - Pulmonic valve: There was trivial regurgitation. - Pulmonary arteries: PA peak pressure: 48 mm Hg (S).  Impressions: - The right ventricular systolic pressure was increased consistent   with moderate pulmonary hypertension   EKG:  EKG is not ordered today.  Recent Labs: 12/21/2017: ALT 15 12/23/2017: BUN 20; Creatinine, Ser 1.14; Hemoglobin 12.0; Magnesium 1.6; Platelets 194; Potassium 3.8; Sodium 137   Recent Lipid Panel    Component Value Date/Time   CHOL 244 (H) 12/22/2017 0457   TRIG 182 (H) 12/22/2017 0457   HDL 41 12/22/2017 0457   CHOLHDL 6.0 12/22/2017 0457   VLDL 36 12/22/2017 0457   LDLCALC 167 (H) 12/22/2017 0457    Physical Exam:    VS:  BP 136/66   Pulse (!) 57   Ht 5\' 4"  (1.626 m)   Wt 185 lb 4 oz (84 kg)   SpO2 97%   BMI 31.80 kg/m     Wt Readings from Last 3 Encounters:  01/08/18 185 lb 4 oz (84 kg)  01/02/18 185 lb (83.9 kg)  12/26/17 185 lb (83.9 kg)     Physical Exam  Constitutional: She is oriented to person, place, and time. She appears well-developed and well-nourished. No distress.  HENT:  Head: Normocephalic and atraumatic.  Neck: Normal range of motion. Neck supple. No JVD present.  Cardiovascular: Normal rate, regular rhythm and intact distal pulses. Exam reveals no gallop and no friction rub.  Murmur heard.  Harsh midsystolic murmur is present with a grade of 2/6 at the upper right sternal border radiating to the neck. Pulmonary/Chest: Effort normal and breath sounds normal.  Abdominal: Soft. Bowel sounds are normal.  Musculoskeletal: Normal range of motion. She exhibits edema.  Trace bilateral ankle edema  Neurological:  She is alert and oriented to person, place, and time.  Skin: Skin is warm and dry.  Psychiatric: She has a normal mood and affect. Her behavior is normal. Thought content  normal.  Vitals reviewed.   ASSESSMENT:    1. Coronary artery disease involving coronary bypass graft of native heart without angina pectoris   2. Essential hypertension   3. Chronic diastolic heart failure ([place])   4. Mixed hyperlipidemia   5. Diabetes mellitus, type II, insulin dependent (HCC)    PLAN:    In order of problems listed above:  CAD: Hx CABG X 6 found to have an occluded SVG to OM branch, known, and moderate proximal stenosis of the Circumflex during workup for chest pain.  She is being treated medically. She has had no further chest pain since hospitalization. Will continue current therapy.   Hypertension: BP well controlled. Continue current therapy  Chronic diastolic heart failure: Pt has chronic ankle edema. Lasix was decreased to 20 mg daily for renal function. No orthopnea or dyspnea. Advised on CHF red/yellow/green chart. Daily wts and can take an extra asix for increased wt, edema or shortness of breath. Also advised on low sodium diet.   Hyperlipidemia: LDL 167.  Not to goal of <70.  She has statins listed as allergy-leg weakness and falling. She was started on [name] 80 mg in the hospital. Will check FLP and AST in 8 weeks. If she does not get to goal of LDL<70 or does not tolerate statin, will refer to lipid clinic for consideration of PCSK9 inhibitor.   Diabetes type 2 on insulin: A1c is 9.9.  Poorly controlled. Pt wasn't taking her insulin due to cost. She is now getting it for $25 from [facility] and states improved compliance.    Medication Adjustments/Labs and Tests Ordered: Current medicines are reviewed at length with the patient today.  Concerns regarding medicines are outlined above. Labs and tests ordered and medication changes are outlined in the patient instructions below:  Patient Instructions  Medication Instructions: Your physician has recommended you make the following change in your medication:  START: Pravastatin 80 mg taking 1 tablet daily     Labwork: Lipids & Lft's IN 8 WEEKS   Procedures/Testing: None Ordered  Follow-Up: Your physician recommends that you schedule a follow-up appointment in: 6 months with Dr.Nahser    Any Additional Special Instructions Will Be Listed Below (If Applicable).   DASH Eating Plan DASH stands for "Dietary Approaches to Stop Hypertension." The DASH eating plan is a healthy eating plan that has been shown to reduce high blood pressure (hypertension). It may also reduce your risk for type 2 diabetes, heart disease, and stroke. The DASH eating plan may also help with weight loss. What are tips for following this plan? General guidelines  Avoid eating more than 2,300 mg (milligrams) of salt (sodium) a day. If you have hypertension, you may need to reduce your sodium intake to 1,500 mg a day.  Limit alcohol intake to no more than 1 drink a day for nonpregnant women and 2 drinks a day for men. One drink equals 12 oz of beer, 5 oz of wine, or 1 oz of hard liquor.  Work with your health care provider to maintain a healthy body weight or to lose weight. Ask what an ideal weight is for you.  Get at least 30 minutes of exercise that causes your heart to beat faster (aerobic exercise) most days of the week. Activities may include walking, swimming, or  biking.  Work with your health care provider or diet and nutrition specialist (dietitian) to adjust your eating plan to your individual calorie needs. Reading food labels  Check food labels for the amount of sodium per serving. Choose foods with less than 5 percent of the Daily Value of sodium. Generally, foods with less than 300 mg of sodium per serving fit into this eating plan.  To find whole grains, look for the word "whole" as the first word in the ingredient list. Shopping  Buy products labeled as "low-sodium" or "no salt added."  Buy fresh foods. Avoid canned foods and premade or frozen meals. Cooking  Avoid adding salt when cooking. Use  salt-free seasonings or herbs instead of table salt or sea salt. Check with your health care provider or pharmacist before using salt substitutes.  Do not fry foods. Cook foods using healthy methods such as baking, boiling, grilling, and broiling instead.  Cook with heart-healthy oils, such as olive, canola, soybean, or sunflower oil. Meal planning   Eat a balanced diet that includes: ? 5 or more servings of fruits and vegetables each day. At each meal, try to fill half of your plate with fruits and vegetables. ? Up to 6-8 servings of whole grains each day. ? Less than 6 oz of lean meat, poultry, or fish each day. A 3-oz serving of meat is about the same size as a deck of cards. One egg equals 1 oz. ? 2 servings of low-fat dairy each day. ? A serving of nuts, seeds, or beans 5 times each week. ? Heart-healthy fats. Healthy fats called Omega-3 fatty acids are found in foods such as flaxseeds and coldwater fish, like sardines, salmon, and mackerel.  Limit how much you eat of the following: ? Canned or prepackaged foods. ? Food that is high in trans fat, such as fried foods. ? Food that is high in saturated fat, such as fatty meat. ? Sweets, desserts, sugary drinks, and other foods with added sugar. ? Full-fat dairy products.  Do not salt foods before eating.  Try to eat at least 2 vegetarian meals each week.  Eat more home-cooked food and less restaurant, buffet, and fast food.  When eating at a restaurant, ask that your food be prepared with less salt or no salt, if possible. What foods are recommended? The items listed may not be a complete list. Talk with your dietitian about what dietary choices are best for you. Grains Whole-grain or whole-wheat bread. Whole-grain or whole-wheat pasta. Brown rice. [place]. Bulgur. Whole-grain and low-sodium cereals. Pita bread. Low-fat, low-sodium crackers. Whole-wheat flour tortillas. Vegetables Fresh or frozen vegetables (raw, steamed,  roasted, or grilled). Low-sodium or reduced-sodium tomato and vegetable juice. Low-sodium or reduced-sodium tomato sauce and tomato paste. Low-sodium or reduced-sodium canned vegetables. Fruits All fresh, dried, or frozen fruit. Canned fruit in natural juice (without added sugar). Meat and other protein foods Skinless chicken or [place]. Ground chicken or [place]. Pork with fat trimmed off. Fish and seafood. Egg whites. Dried beans, peas, or lentils. Unsalted nuts, nut butters, and seeds. Unsalted canned beans. Lean cuts of beef with fat trimmed off. Low-sodium, lean deli meat. Dairy Low-fat (1%) or fat-free (skim) milk. Fat-free, low-fat, or reduced-fat cheeses. Nonfat, low-sodium ricotta or cottage cheese. Low-fat or nonfat yogurt. Low-fat, low-sodium cheese. Fats and oils Soft margarine without trans fats. Vegetable oil. Low-fat, reduced-fat, or light mayonnaise and salad dressings (reduced-sodium). Canola, safflower, olive, soybean, and sunflower oils. Avocado. Seasoning and other foods Herbs. Spices.  Seasoning mixes without salt. Unsalted popcorn and pretzels. Fat-free sweets. What foods are not recommended? The items listed may not be a complete list. Talk with your dietitian about what dietary choices are best for you. Grains Baked goods made with fat, such as croissants, muffins, or some breads. Dry pasta or rice meal packs. Vegetables Creamed or fried vegetables. Vegetables in a cheese sauce. Regular canned vegetables (not low-sodium or reduced-sodium). Regular canned tomato sauce and paste (not low-sodium or reduced-sodium). Regular tomato and vegetable juice (not low-sodium or reduced-sodium). Angie Fava. Olives. Fruits Canned fruit in a light or heavy syrup. Fried fruit. Fruit in cream or butter sauce. Meat and other protein foods Fatty cuts of meat. Ribs. Fried meat. Berniece Salines. Sausage. Bologna and other processed lunch meats. Salami. Fatback. Hotdogs. Bratwurst. Salted nuts and seeds. Canned  beans with added salt. Canned or smoked fish. Whole eggs or egg yolks. Chicken or [place] with skin. Dairy Whole or 2% milk, cream, and half-and-half. Whole or full-fat cream cheese. Whole-fat or sweetened yogurt. Full-fat cheese. Nondairy creamers. Whipped toppings. Processed cheese and cheese spreads. Fats and oils Butter. Stick margarine. Lard. Shortening. Ghee. Bacon fat. Tropical oils, such as coconut, palm kernel, or palm oil. Seasoning and other foods Salted popcorn and pretzels. Onion salt, garlic salt, seasoned salt, table salt, and sea salt. Worcestershire sauce. Tartar sauce. Barbecue sauce. Teriyaki sauce. Soy sauce, including reduced-sodium. Steak sauce. Canned and packaged gravies. Fish sauce. Oyster sauce. Cocktail sauce. Horseradish that you find on the shelf. Ketchup. Mustard. Meat flavorings and tenderizers. Bouillon cubes. Hot sauce and Tabasco sauce. Premade or packaged marinades. Premade or packaged taco seasonings. Relishes. Regular salad dressings. Where to find more information:  National Heart, Lung, and [place]: https://wilson-eaton.com/  American Heart Association: www.heart.org Summary  The DASH eating plan is a healthy eating plan that has been shown to reduce high blood pressure (hypertension). It may also reduce your risk for type 2 diabetes, heart disease, and stroke.  With the DASH eating plan, you should limit salt (sodium) intake to 2,300 mg a day. If you have hypertension, you may need to reduce your sodium intake to 1,500 mg a day.  When on the DASH eating plan, aim to eat more fresh fruits and vegetables, whole grains, lean proteins, low-fat dairy, and heart-healthy fats.  Work with your health care provider or diet and nutrition specialist (dietitian) to adjust your eating plan to your individual calorie needs. This information is not intended to replace advice given to you by your health care provider. Make sure you discuss any questions you have with your  health care provider. Document Released: 08/04/2011 Document Revised: 08/08/2016 Document Reviewed: 08/08/2016 Elsevier Interactive Patient Education  [employer].    If you need a refill on your cardiac medications before your next appointment, please call your pharmacy.      Signed, Daune Perch, NP  01/08/2018 1:26 PM    [facility]

## 2018-01-09 ENCOUNTER — Other Ambulatory Visit: Payer: Self-pay | Admitting: Family Medicine

## 2018-01-09 DIAGNOSIS — M545 Low back pain, unspecified: Secondary | ICD-10-CM

## 2018-01-09 MED ORDER — TRAMADOL HCL 50 MG PO TABS: 50.0000 mg | ORAL_TABLET | Freq: Three times a day (TID) | ORAL | 0 refills | Status: DC | PRN

## 2018-01-09 NOTE — Telephone Encounter (Signed)
[facility] is requesting for [place] to send RX to them for her Tramadol - OK to send through mail order???

## 2018-01-09 NOTE — Telephone Encounter (Signed)
I am ok with that, how many is she taking per month?

## 2018-01-09 NOTE — Telephone Encounter (Signed)
She only gets 60 at a time and takes them very sparingly however insurance will want a 3 month supply. The rx we sent 12/26/17 was the only one this year. If ok to send it is ready to be sent to [facility] for 180 tablets.

## 2018-01-16 ENCOUNTER — Observation Stay (HOSPITAL_COMMUNITY)
Admission: EM | Admit: 2018-01-16 | Discharge: 2018-01-19 | Disposition: A | Discharge status: Home / Self Care | Payer: Medicare Other | Attending: Family Medicine | Admitting: Family Medicine

## 2018-01-16 ENCOUNTER — Other Ambulatory Visit: Payer: Self-pay

## 2018-01-16 ENCOUNTER — Encounter (HOSPITAL_COMMUNITY): Payer: Self-pay | Admitting: Radiology

## 2018-01-16 ENCOUNTER — Emergency Department (HOSPITAL_COMMUNITY): Payer: Medicare Other

## 2018-01-16 DIAGNOSIS — I6389 Other cerebral infarction: Secondary | ICD-10-CM

## 2018-01-16 DIAGNOSIS — I130 Hypertensive heart and chronic kidney disease with heart failure and stage 1 through stage 4 chronic kidney disease, or unspecified chronic kidney disease: Secondary | ICD-10-CM | POA: Insufficient documentation

## 2018-01-16 DIAGNOSIS — Z87891 Personal history of nicotine dependence: Secondary | ICD-10-CM | POA: Insufficient documentation

## 2018-01-16 DIAGNOSIS — I700 Atherosclerosis of aorta: Secondary | ICD-10-CM | POA: Insufficient documentation

## 2018-01-16 DIAGNOSIS — I6503 Occlusion and stenosis of bilateral vertebral arteries: Secondary | ICD-10-CM | POA: Insufficient documentation

## 2018-01-16 DIAGNOSIS — I69328 Other speech and language deficits following cerebral infarction: Secondary | ICD-10-CM | POA: Insufficient documentation

## 2018-01-16 DIAGNOSIS — E1151 Type 2 diabetes mellitus with diabetic peripheral angiopathy without gangrene: Secondary | ICD-10-CM | POA: Insufficient documentation

## 2018-01-16 DIAGNOSIS — I5032 Chronic diastolic (congestive) heart failure: Secondary | ICD-10-CM | POA: Diagnosis not present

## 2018-01-16 DIAGNOSIS — I219 Acute myocardial infarction, unspecified: Secondary | ICD-10-CM | POA: Diagnosis present

## 2018-01-16 DIAGNOSIS — Z955 Presence of coronary angioplasty implant and graft: Secondary | ICD-10-CM | POA: Insufficient documentation

## 2018-01-16 DIAGNOSIS — E669 Obesity, unspecified: Secondary | ICD-10-CM | POA: Diagnosis not present

## 2018-01-16 DIAGNOSIS — I771 Stricture of artery: Secondary | ICD-10-CM | POA: Insufficient documentation

## 2018-01-16 DIAGNOSIS — I639 Cerebral infarction, unspecified: Principal | ICD-10-CM | POA: Insufficient documentation

## 2018-01-16 DIAGNOSIS — I272 Pulmonary hypertension, unspecified: Secondary | ICD-10-CM | POA: Diagnosis not present

## 2018-01-16 DIAGNOSIS — H539 Unspecified visual disturbance: Secondary | ICD-10-CM | POA: Diagnosis not present

## 2018-01-16 DIAGNOSIS — Z951 Presence of aortocoronary bypass graft: Secondary | ICD-10-CM | POA: Insufficient documentation

## 2018-01-16 DIAGNOSIS — Z833 Family history of diabetes mellitus: Secondary | ICD-10-CM | POA: Insufficient documentation

## 2018-01-16 DIAGNOSIS — E1165 Type 2 diabetes mellitus with hyperglycemia: Secondary | ICD-10-CM | POA: Diagnosis not present

## 2018-01-16 DIAGNOSIS — Z7982 Long term (current) use of aspirin: Secondary | ICD-10-CM | POA: Insufficient documentation

## 2018-01-16 DIAGNOSIS — E1122 Type 2 diabetes mellitus with diabetic chronic kidney disease: Secondary | ICD-10-CM | POA: Insufficient documentation

## 2018-01-16 DIAGNOSIS — R42 Dizziness and giddiness: Secondary | ICD-10-CM | POA: Diagnosis not present

## 2018-01-16 DIAGNOSIS — Z9049 Acquired absence of other specified parts of digestive tract: Secondary | ICD-10-CM | POA: Insufficient documentation

## 2018-01-16 DIAGNOSIS — R001 Bradycardia, unspecified: Secondary | ICD-10-CM | POA: Diagnosis not present

## 2018-01-16 DIAGNOSIS — M1990 Unspecified osteoarthritis, unspecified site: Secondary | ICD-10-CM | POA: Insufficient documentation

## 2018-01-16 DIAGNOSIS — E7800 Pure hypercholesterolemia, unspecified: Secondary | ICD-10-CM | POA: Diagnosis not present

## 2018-01-16 DIAGNOSIS — I739 Peripheral vascular disease, unspecified: Secondary | ICD-10-CM | POA: Diagnosis present

## 2018-01-16 DIAGNOSIS — I051 Rheumatic mitral insufficiency: Secondary | ICD-10-CM | POA: Insufficient documentation

## 2018-01-16 DIAGNOSIS — G319 Degenerative disease of nervous system, unspecified: Secondary | ICD-10-CM | POA: Insufficient documentation

## 2018-01-16 DIAGNOSIS — E119 Type 2 diabetes mellitus without complications: Secondary | ICD-10-CM

## 2018-01-16 DIAGNOSIS — I10 Essential (primary) hypertension: Secondary | ICD-10-CM | POA: Diagnosis present

## 2018-01-16 DIAGNOSIS — Z79899 Other long term (current) drug therapy: Secondary | ICD-10-CM | POA: Insufficient documentation

## 2018-01-16 DIAGNOSIS — I6523 Occlusion and stenosis of bilateral carotid arteries: Secondary | ICD-10-CM | POA: Insufficient documentation

## 2018-01-16 DIAGNOSIS — I672 Cerebral atherosclerosis: Secondary | ICD-10-CM | POA: Insufficient documentation

## 2018-01-16 DIAGNOSIS — I2510 Atherosclerotic heart disease of native coronary artery without angina pectoris: Secondary | ICD-10-CM | POA: Diagnosis not present

## 2018-01-16 DIAGNOSIS — Z8249 Family history of ischemic heart disease and other diseases of the circulatory system: Secondary | ICD-10-CM | POA: Insufficient documentation

## 2018-01-16 DIAGNOSIS — I420 Dilated cardiomyopathy: Secondary | ICD-10-CM | POA: Diagnosis not present

## 2018-01-16 DIAGNOSIS — R2681 Unsteadiness on feet: Secondary | ICD-10-CM | POA: Insufficient documentation

## 2018-01-16 DIAGNOSIS — N183 Chronic kidney disease, stage 3 (moderate): Secondary | ICD-10-CM | POA: Diagnosis not present

## 2018-01-16 DIAGNOSIS — Z794 Long term (current) use of insulin: Secondary | ICD-10-CM | POA: Insufficient documentation

## 2018-01-16 DIAGNOSIS — I6302 Cerebral infarction due to thrombosis of basilar artery: Secondary | ICD-10-CM

## 2018-01-16 DIAGNOSIS — Z6833 Body mass index (BMI) 33.0-33.9, adult: Secondary | ICD-10-CM | POA: Insufficient documentation

## 2018-01-16 DIAGNOSIS — Z9889 Other specified postprocedural states: Secondary | ICD-10-CM | POA: Insufficient documentation

## 2018-01-16 DIAGNOSIS — I6782 Cerebral ischemia: Secondary | ICD-10-CM | POA: Insufficient documentation

## 2018-01-16 DIAGNOSIS — Z7902 Long term (current) use of antithrombotics/antiplatelets: Secondary | ICD-10-CM | POA: Insufficient documentation

## 2018-01-16 LAB — I-STAT CHEM 8, ED
BUN: 24 mg/dL — AB (ref 6–20)
CREATININE: 1.1 mg/dL — AB (ref 0.44–1.00)
Calcium, Ion: 1.23 mmol/L (ref 1.15–1.40)
Chloride: 107 mmol/L (ref 101–111)
GLUCOSE: 183 mg/dL — AB (ref 65–99)
HCT: 43 % (ref 36.0–46.0)
HEMOGLOBIN: 14.6 g/dL (ref 12.0–15.0)
POTASSIUM: 4.2 mmol/L (ref 3.5–5.1)
Sodium: 143 mmol/L (ref 135–145)
TCO2: 25 mmol/L (ref 22–32)

## 2018-01-16 LAB — COMPREHENSIVE METABOLIC PANEL
ALK PHOS: 82 U/L (ref 38–126)
ALT: 17 U/L (ref 14–54)
AST: 18 U/L (ref 15–41)
Albumin: 4 g/dL (ref 3.5–5.0)
Anion gap: 10 (ref 5–15)
BUN: 25 mg/dL — AB (ref 6–20)
CALCIUM: 9.4 mg/dL (ref 8.9–10.3)
CO2: 24 mmol/L (ref 22–32)
CREATININE: 1.2 mg/dL — AB (ref 0.44–1.00)
Chloride: 108 mmol/L (ref 101–111)
GFR, EST AFRICAN AMERICAN: 52 mL/min — AB (ref >60)
GFR, EST NON AFRICAN AMERICAN: 45 mL/min — AB (ref >60)
Glucose, Bld: 193 mg/dL — ABNORMAL HIGH (ref 65–99)
Potassium: 4.2 mmol/L (ref 3.5–5.1)
SODIUM: 142 mmol/L (ref 135–145)
Total Bilirubin: 0.4 mg/dL (ref 0.3–1.2)
Total Protein: 7.7 g/dL (ref 6.5–8.1)

## 2018-01-16 LAB — CBG MONITORING, ED
Glucose-Capillary: 183 mg/dL — ABNORMAL HIGH (ref 65–99)
Glucose-Capillary: 190 mg/dL — ABNORMAL HIGH (ref 65–99)

## 2018-01-16 LAB — CBC
HEMATOCRIT: 40.9 % (ref 36.0–46.0)
Hemoglobin: 13.8 g/dL (ref 12.0–15.0)
MCH: 27.7 pg (ref 26.0–34.0)
MCHC: 33.7 g/dL (ref 30.0–36.0)
MCV: 82.1 fL (ref 78.0–100.0)
Platelets: 198 10*3/uL (ref 150–400)
RBC: 4.98 MIL/uL (ref 3.87–5.11)
RDW: 17.2 % — ABNORMAL HIGH (ref 11.5–15.5)
WBC: 6.1 10*3/uL (ref 4.0–10.5)

## 2018-01-16 LAB — DIFFERENTIAL
BASOS ABS: 0 10*3/uL (ref 0.0–0.1)
Basophils Relative: 0 %
Eosinophils Absolute: 0.1 10*3/uL (ref 0.0–0.7)
Eosinophils Relative: 2 %
LYMPHS PCT: 35 %
Lymphs Abs: 2.1 10*3/uL (ref 0.7–4.0)
MONO ABS: 0.5 10*3/uL (ref 0.1–1.0)
MONOS PCT: 8 %
Neutro Abs: 3.3 10*3/uL (ref 1.7–7.7)
Neutrophils Relative %: 55 %

## 2018-01-16 LAB — PROTIME-INR
INR: 0.88
Prothrombin Time: 11.8 seconds (ref 11.4–15.2)

## 2018-01-16 LAB — I-STAT TROPONIN, ED: Troponin i, poc: 0.01 ng/mL (ref 0.00–0.08)

## 2018-01-16 LAB — APTT: aPTT: 34 seconds (ref 24–36)

## 2018-01-16 MED ORDER — IOPAMIDOL (ISOVUE-370) INJECTION 76%
INTRAVENOUS | Status: AC
Start: 2018-01-16 — End: 2018-01-16
  Administered 2018-01-16: 16:00:00
  Filled 2018-01-16: qty 100

## 2018-01-16 MED ORDER — ASPIRIN 81 MG PO CHEW
324.0000 mg | CHEWABLE_TABLET | Freq: Once | ORAL | Status: AC
  Administered 2018-01-16: 324 mg via ORAL
  Filled 2018-01-16: qty 4

## 2018-01-16 MED ORDER — IOPAMIDOL (ISOVUE-370) INJECTION 76%
100.0000 mL | Freq: Once | INTRAVENOUS | Status: AC | PRN
  Administered 2018-01-16: 80 mL via INTRAVENOUS
  Administered 2018-01-16: 17:00:00 via INTRAVENOUS

## 2018-01-16 MED ORDER — IOPAMIDOL (ISOVUE-370) INJECTION 76%
INTRAVENOUS | Status: AC
  Filled 2018-01-16: qty 100

## 2018-01-16 MED ORDER — MECLIZINE HCL 25 MG PO TABS
25.0000 mg | ORAL_TABLET | Freq: Once | ORAL | Status: AC
  Administered 2018-01-16: 25 mg via ORAL
  Filled 2018-01-16: qty 1

## 2018-01-16 MED ORDER — IOPAMIDOL (ISOVUE-370) INJECTION 76%
100.0000 mL | Freq: Once | INTRAVENOUS | Status: AC | PRN
  Administered 2018-01-16: 80 mL via INTRAVENOUS

## 2018-01-16 NOTE — H&P (Addendum)
History and Physical    Brenda Moreno:[id number] DOB: 1946/08/26 DOA: 01/16/2018  PCP: Susy Frizzle, MD   Patient coming from: Home   Chief Complaint:  Stable gait  HPI: Brenda Moreno is a 72 y.o. female with medical history significant for DM2, HTN, Diastolic CHF, CABG, PVD, who presented to the ED today with complaints of unstable gait that started at about 5 AM this morning when she got up to use the bathroom.  Patient reports she felt like she was moving sideways. She denies lightheadedness or sensation of room spinning.  She has also endorses double vision that started at about the same time, that improves when she closes one eye. She denies weakness of extremities, no bowel or urinary problems, no shortness of breath or chest pain, no leg swelling.   ED Course: Heart rate bradycardic 49-64, blood pressure elevated systolic 956O to 130Q.  Blood sugar-  193, otherwise unremarkable BMP, CBC.  CT Angio head/neck with/Wo contrast-50% mild to moderate stenosis of left subclavian artery origin, multiple segments of mild stenosis in the anterior and posterior circulations, as well as mild to moderate distal left cavernous ICA stenosis.  Subsequent MRI brain without contrast-subcentimeter focus of acute/early subacute infarction extending from right paramedian midbrain to the right superior cerebellar peduncle.  Neurology was consulted- Dr Lorraine Lax, recommended admission to [facility] hospitalist service for CVA workup.  Review of Systems: As per HPI otherwise 10 point review of systems negative.  Past Medical History:  Diagnosis Date  . Arthritis   . CAD (coronary artery disease)    S/P cabg  . Carotid artery occlusion   . Cataract   . CHF (congestive heart failure) ([place])   . DDD (degenerative disc disease), lumbar   . Diabetes mellitus   . Hyperlipidemia   . Hypertension   . Joint pain   . Leg pain   . Myocardial infarction ([place]) 1998  . Peripheral vascular disease ([place])   . PVD  (peripheral vascular disease) ([place])     Past Surgical History:  Procedure Laterality Date  . APPENDECTOMY    . BREAST EXCISIONAL BIOPSY Left 2008  . CHOLECYSTECTOMY     Gall bladder  . COLONOSCOPY  2008  . CORONARY ARTERY BYPASS GRAFT     1998  . LEFT HEART CATH AND CORS/GRAFTS ANGIOGRAPHY N/A 12/22/2017   Procedure: LEFT HEART CATH AND CORS/GRAFTS ANGIOGRAPHY;  Surgeon: Burnell Blanks, MD;  Location: [facility];  Service: Cardiovascular;  Laterality: N/A;  . PR VEIN BYPASS GRAFT,AORTO-FEM-POP    . TUBAL LIGATION       reports that she quit smoking about 6 years ago. Her smoking use included cigarettes. She smoked 0.25 packs per day. She has never used smokeless tobacco. She reports that she does not drink alcohol or use drugs.  Allergies  Allergen Reactions  . Levaquin [Levofloxacin In D5w] Other (See Comments)    Pain all over and in joints  . Statins Other (See Comments)    Severe myalgias to lipitor, crestor, pravastatin and weakness and cramping  in bilateral leg.  [name] [Linagliptin] Itching    Family History  Problem Relation Age of Onset  . Heart disease Mother   . Diabetes Mother   . Hyperlipidemia Mother   . Hypertension Mother   . Heart disease Father        Heart Disease before age 10  . Diabetes Father   . Hyperlipidemia Father   . Hypertension Father   .  Heart disease Sister        heart attack  . Diabetes Sister        Amputation  . Hypertension Sister   . Other Sister        history of amputation  . Heart attack Sister   . Diabetes Brother   . Hypertension Brother   . Heart disease Brother 57       Before age 10  . Hyperlipidemia Brother   . Diabetes Brother        scirrosis of liver  . Coronary artery disease Other   . Colon cancer Neg Hx   . Stomach cancer Neg Hx   . Esophageal cancer Neg Hx     Prior to Admission medications   Medication Sig Start Date End Date Taking? Authorizing Provider  amLODipine (NORVASC) 5  MG tablet TAKE 1 TABLET BY MOUTH  DAILY 11/29/17  Yes Susy Frizzle, MD  aspirin EC 81 MG tablet Take 81 mg by mouth daily.   Yes [provider]  CINNAMON PO Take 1,000 mg by mouth 2 (two) times daily.     Yes [provider]  Coenzyme Q10 (CO Q 10 PO) Take 100 mg by mouth daily.     Yes [provider]  fluticasone (FLONASE) 50 MCG/ACT nasal spray Place 2 sprays into both nostrils daily as needed for allergies. 01/14/18  Yes [provider]  furosemide (LASIX) 40 MG tablet Take 20 mg by mouth daily. Patient taking 0.5 tablets po qd.   Yes [provider]  insulin NPH-regular Human (NOVOLIN 70/30) (70-30) 100 UNIT/ML injection 15 units with breakfast and 7 units with supper 12/26/17  Yes Susy Frizzle, MD  metFORMIN (GLUCOPHAGE) 1000 MG tablet TAKE 1 TABLET BY MOUTH  TWICE A DAY WITH MEALS 11/29/17  Yes Susy Frizzle, MD  metoprolol succinate (TOPROL-XL) 100 MG 24 hr tablet TAKE 1 TABLET BY MOUTH  DAILY WITH OR IMMEDIATLEY  FOLLOWING A MEAL 12/26/17  Yes Susy Frizzle, MD  pioglitazone (ACTOS) 30 MG tablet Take 30 mg by mouth as directed.   Yes [provider]  potassium chloride (K-DUR) 10 MEQ tablet Take 1 tablet (10 mEq total) by mouth daily. 06/02/16 01/16/18 Yes Nahser, Wonda Cheng, MD  pravastatin ([name]) 80 MG tablet Take 1 tablet (80 mg total) by mouth every evening. 01/08/18 04/08/18 Yes Daune Perch, NP  quinapril (ACCUPRIL) 20 MG tablet Take 20 mg by mouth 2 (two) times daily.   Yes [provider]  nitroGLYCERIN (NITROSTAT) 0.4 MG SL tablet Place 1 tablet (0.4 mg total) under the tongue every 5 (five) minutes as needed for chest pain. 11/12/14   Susy Frizzle, MD  traMADol (ULTRAM) 50 MG tablet Take 1 tablet (50 mg total) by mouth every 8 (eight) hours as needed. Patient not taking: Reported on 01/16/2018 01/09/18   Susy Frizzle, MD    Physical Exam: Vitals:   01/16/18 1700 01/16/18 1800 01/16/18 1830  01/16/18 2024  BP: (!) 128/46 (!) 159/62 (!) 147/44 (!) 179/68  Pulse: 60 (!) 47 (!) 48 (!) 51  Resp: 19 14 (!) 21 (!) 21  Temp:      TempSrc:      SpO2: 98% 99% 97% 100%  Weight:      Height:        Constitutional: NAD, calm, comfortable Vitals:   01/16/18 1700 01/16/18 1800 01/16/18 1830 01/16/18 2024  BP: (!) 128/46 (!) 159/62 (!) 147/44 (!) 179/68  Pulse: 60 (!) 47 (!) 48 (!) 51  Resp: 19 14 (!) 21 (!) 21  Temp:      TempSrc:      SpO2: 98% 99% 97% 100%  Weight:      Height:       Eyes: PERRL, lids and conjunctivae normal ENMT: Mucous membranes are moist. Posterior pharynx clear of any exudate or lesions.  Neck: normal, supple, no masses, no thyromegaly Respiratory: clear to auscultation bilaterally, no wheezing, no crackles. Normal respiratory effort. No accessory muscle use.  Cardiovascular: Bradycardic, but regular rate and rhythm, no murmurs / rubs / gallops. No extremity edema. 2+ pedal pulses. No carotid bruits.  Abdomen: no tenderness, no masses palpated. No hepatosplenomegaly. Bowel sounds positive.  Musculoskeletal: no clubbing / cyanosis. No joint deformity upper and lower extremities. Good ROM, no contractures. Normal muscle tone.  Skin: no rashes, lesions, ulcers. No induration Neurologic: CN 2-12 grossly intact. Sensation intact, DTR normal. Strength 5/5 in all 4.  Heel to chin test normal. Psychiatric: Normal judgment and insight. Alert and oriented x 3. Normal mood.   Labs on Admission: I have personally reviewed following labs and imaging studies  CBC: Recent Labs  Lab 01/16/18 1319 01/16/18 1327  WBC 6.1  --   NEUTROABS 3.3  --   HGB 13.8 14.6  HCT 40.9 43.0  MCV 82.1  --   PLT 198  --    Basic Metabolic Panel: Recent Labs  Lab 01/16/18 1319 01/16/18 1327  NA 142 143  K 4.2 4.2  CL 108 107  CO2 24  --   GLUCOSE 193* 183*  BUN 25* 24*  CREATININE 1.20* 1.10*  CALCIUM 9.4  --    Liver Function Tests: Recent Labs  Lab 01/16/18 1319    AST 18  ALT 17  ALKPHOS 82  BILITOT 0.4  PROT 7.7  ALBUMIN 4.0   Coagulation Profile: Recent Labs  Lab 01/16/18 1319  INR 0.88   CBG: Recent Labs  Lab 01/16/18 1300 01/16/18 1334  GLUCAP 183* 190*   Urine analysis:    Component Value Date/Time   COLORURINE STRAW (A) 03/20/2017 1211   APPEARANCEUR CLEAR 03/20/2017 1211   LABSPEC 1.012 03/20/2017 1211   PHURINE 5.0 03/20/2017 1211   GLUCOSEU NEGATIVE 03/20/2017 1211   HGBUR NEGATIVE 03/20/2017 1211   BILIRUBINUR NEGATIVE 03/20/2017 1211   KETONESUR NEGATIVE 03/20/2017 1211   PROTEINUR NEGATIVE 03/20/2017 1211   UROBILINOGEN 0.2 07/12/2013 1055   NITRITE NEGATIVE 03/20/2017 1211   LEUKOCYTESUR NEGATIVE 03/20/2017 1211    Radiological Exams on Admission: Ct Angio Head W Or Wo Contrast  Result Date: 01/16/2018 CLINICAL DATA:  72 y/o F; dizziness with trouble balancing. History of cardiac catheterization 12/22/2017. EXAM: CT ANGIOGRAPHY HEAD AND NECK TECHNIQUE: Multidetector CT imaging of the head and neck was performed using the standard protocol during bolus administration of intravenous contrast. Multiplanar CT image reconstructions and MIPs were obtained to evaluate the vascular anatomy. Carotid stenosis measurements (when applicable) are obtained utilizing NASCET criteria, using the distal internal carotid diameter as the denominator. CONTRAST:  160 cc Isovue 370 COMPARISON:  03/23/2017 CT head.  07/06/2010 MRI head. FINDINGS: CT HEAD FINDINGS Brain: No evidence of acute infarction, hemorrhage, hydrocephalus, extra-axial collection or mass lesion/mass effect. Stable small chronic lacunar infarct in the left thalamus. Stable chronic microvascular ischemic changes of white matter and parenchymal volume loss the brain. Vascular: As below. Skull: Normal. Negative for fracture or focal lesion. Sinuses: Imaged portions are clear. Orbits: No acute  finding. Review of the MIP images confirms the above findings CTA NECK FINDINGS Aortic  arch: Standard branching. Imaged portion shows no evidence of aneurysm or dissection. Post CABG and median sternotomy. Mild calcific atherosclerosis. Calcific atherosclerosis of left subclavian artery origin, partially obscured by streak artifact, probable 50% moderate underlying stenosis. Right carotid system: No evidence of dissection, stenosis (50% or greater) or occlusion. Non stenotic calcified plaque of carotid bifurcation. Left carotid system: No evidence of dissection, stenosis (50% or greater) or occlusion. Vertebral arteries: Codominant. No evidence of dissection, stenosis (50% or greater) or occlusion. Calcific atherosclerosis of vertebral artery origins with mild less than 50% stenosis. Skeleton: C5-6 anterior cervical discectomy. No acute osseous abnormality. Other neck: Negative. Upper chest: Negative. Review of the MIP images confirms the above findings CTA HEAD FINDINGS Anterior circulation: Calcific atherosclerosis of bilateral carotid siphons with mild right paraclinoid and mild-to-moderate distal left cavernous stenosis. No high-grade stenosis, proximal occlusion, aneurysm, or vascular malformation. Posterior circulation: Calcific atherosclerosis of bilateral V4 segments with mild stenosis. No additional segment of significant stenosis, proximal occlusion, aneurysm, or vascular malformation. Venous sinuses: As permitted by contrast timing, patent. Anatomic variants: No posterior communicating artery identified, likely hypoplastic or absent. Patent anterior communicating artery. Delayed phase: No abnormal intracranial enhancement. Review of the MIP images confirms the above findings IMPRESSION: CT head: 1. No acute intracranial abnormality identified. 2. Stable chronic microvascular ischemic changes and parenchymal volume loss of the brain. CTA neck: 1. Patent carotid and vertebral arteries. No significant stenosis by NASCET criteria, dissection, or occlusion. 2. Calcified plaque bilateral  vertebral artery origins with mild less than 50% stenosis. 3. Calcified plaque of left subclavian artery origin, partially obscured by streak artifact, approximately 50% mild-to-moderate stenosis. CTA head: 1. Patent anterior and posterior intracranial circulation. No high-grade stenosis, aneurysm, large vessel occlusion, or vascular malformation. 2. Multiple segments of mild stenosis in the anterior and posterior circulations as well as mild-to-moderate distal left cavernous ICA stenosis. Electronically Signed   By: Kristine Garbe M.D.   On: 01/16/2018 17:08   Ct Angio Neck W And/or Wo Contrast  Result Date: 01/16/2018 CLINICAL DATA:  72 y/o F; dizziness with trouble balancing. History of cardiac catheterization 12/22/2017. EXAM: CT ANGIOGRAPHY HEAD AND NECK TECHNIQUE: Multidetector CT imaging of the head and neck was performed using the standard protocol during bolus administration of intravenous contrast. Multiplanar CT image reconstructions and MIPs were obtained to evaluate the vascular anatomy. Carotid stenosis measurements (when applicable) are obtained utilizing NASCET criteria, using the distal internal carotid diameter as the denominator. CONTRAST:  160 cc Isovue 370 COMPARISON:  03/23/2017 CT head.  07/06/2010 MRI head. FINDINGS: CT HEAD FINDINGS Brain: No evidence of acute infarction, hemorrhage, hydrocephalus, extra-axial collection or mass lesion/mass effect. Stable small chronic lacunar infarct in the left thalamus. Stable chronic microvascular ischemic changes of white matter and parenchymal volume loss the brain. Vascular: As below. Skull: Normal. Negative for fracture or focal lesion. Sinuses: Imaged portions are clear. Orbits: No acute finding. Review of the MIP images confirms the above findings CTA NECK FINDINGS Aortic arch: Standard branching. Imaged portion shows no evidence of aneurysm or dissection. Post CABG and median sternotomy. Mild calcific atherosclerosis. Calcific  atherosclerosis of left subclavian artery origin, partially obscured by streak artifact, probable 50% moderate underlying stenosis. Right carotid system: No evidence of dissection, stenosis (50% or greater) or occlusion. Non stenotic calcified plaque of carotid bifurcation. Left carotid system: No evidence of dissection, stenosis (50% or greater) or occlusion. Vertebral arteries: Codominant. No  evidence of dissection, stenosis (50% or greater) or occlusion. Calcific atherosclerosis of vertebral artery origins with mild less than 50% stenosis. Skeleton: C5-6 anterior cervical discectomy. No acute osseous abnormality. Other neck: Negative. Upper chest: Negative. Review of the MIP images confirms the above findings CTA HEAD FINDINGS Anterior circulation: Calcific atherosclerosis of bilateral carotid siphons with mild right paraclinoid and mild-to-moderate distal left cavernous stenosis. No high-grade stenosis, proximal occlusion, aneurysm, or vascular malformation. Posterior circulation: Calcific atherosclerosis of bilateral V4 segments with mild stenosis. No additional segment of significant stenosis, proximal occlusion, aneurysm, or vascular malformation. Venous sinuses: As permitted by contrast timing, patent. Anatomic variants: No posterior communicating artery identified, likely hypoplastic or absent. Patent anterior communicating artery. Delayed phase: No abnormal intracranial enhancement. Review of the MIP images confirms the above findings IMPRESSION: CT head: 1. No acute intracranial abnormality identified. 2. Stable chronic microvascular ischemic changes and parenchymal volume loss of the brain. CTA neck: 1. Patent carotid and vertebral arteries. No significant stenosis by NASCET criteria, dissection, or occlusion. 2. Calcified plaque bilateral vertebral artery origins with mild less than 50% stenosis. 3. Calcified plaque of left subclavian artery origin, partially obscured by streak artifact, approximately  50% mild-to-moderate stenosis. CTA head: 1. Patent anterior and posterior intracranial circulation. No high-grade stenosis, aneurysm, large vessel occlusion, or vascular malformation. 2. Multiple segments of mild stenosis in the anterior and posterior circulations as well as mild-to-moderate distal left cavernous ICA stenosis. Electronically Signed   By: Kristine Garbe M.D.   On: 01/16/2018 17:08   Mr Brain Wo Contrast  Result Date: 01/16/2018 CLINICAL DATA:  72 y/o  F; ataxia, stroke suspected. EXAM: MRI HEAD WITHOUT CONTRAST TECHNIQUE: Multiplanar, multiecho pulse sequences of the brain and surrounding structures were obtained without intravenous contrast. COMPARISON:  01/16/2018 CT angiogram of the head and neck. FINDINGS: Brain: Subcentimeter focus of reduced diffusion extending from the right paramedian midbrain to the right superior cerebellar peduncle (series 3, image 18-20). No associated hemorrhage or mass effect. Small chronic infarctions are present within the left cerebellar hemisphere, upper pons, right genu of internal capsule, and left thalamus. Patchy nonspecific foci of T2 FLAIR hyperintense signal abnormality in subcortical and periventricular white matter are compatible with moderate chronic microvascular ischemic changes for age. Moderate brain parenchymal volume loss. Vascular: Normal flow voids. Skull and upper cervical spine: Normal marrow signal. Sinuses/Orbits: Negative. Other: None. IMPRESSION: 1. Subcentimeter focus of acute/early subacute infarction extending from the right paramedian midbrain the right superior cerebellar peduncle. No associated hemorrhage or mass effect. 2. Small chronic infarctions are present within the left cerebellar hemisphere, upper pons, right genu of internal capsule, thalamus. 3. Moderate for age chronic microvascular ischemic changes and parenchymal volume loss of the brain. These results were called by telephone at the time of interpretation on  01/16/2018 at 7:43 pm to Dr. Sherwood Gambler , who verbally acknowledged these results. Electronically Signed   By: Kristine Garbe M.D.   On: 01/16/2018 19:45    EKG: Independently reviewed. Sinus bradycardia, normal intervals.   Assessment/Plan Principal Problem:   CVA (cerebral vascular accident) ([place]) Active Problems:   Diabetes mellitus ([place])   HYPERCHOLESTEROLEMIA   Benign essential HTN   Peripheral vascular disease, unspecified ([place])   Chronic diastolic heart failure ([facility])   Myocardial infarction ([place])  CVA- Unsteady gait.  MRI brain subcentimeter focus of acute/early subacute infarction extending from right paramedian midbrain to right superior cerebellar peduncle, chronic small infarctions. Patient complaint with aspirin 81mg  daily.  Patient has not tolerated statins  Lipitor or Crestor in the past-reported lower extremities "giving way".  Was restarted on pravastatin recently and has experienced same effect, so patient discontinued medication 2 days ago. Patient appears to be a vasculopath and would benefit from statin therapy. 325 mg aspirin given in ED. - Hold antihypertensives to allow for permissive hypertension in the setting of recent CVA -Neurology consulted in ED, f/u recs - CTA neck in Ed shows - bilat vertebral arterey stenosis <50%, left subclavian artery stenosis ~50% mild- mod. - Echo - Lipid- recent 12/22/2017- LDL- 167, TG- 182, Total chol 244, HDL- 41. - Hgba1c- recent- 12/22/2017- 9.9 - Aspirin 81mg  daily -May benefit from additional antiplatelet therapy, per neurology - Swallow eval, PT, OT  HTN-elevated blood pressure systolic 758I to 325Q -We will allow for permissive hypertension in the setting of recent CVA, hold home quinapril, Norvasc, metoprolol  Diastolic CHF, CABG, bradycardia-appears euvolemic , no chest pain, recent ECHO - 12/23/17-Ef- 60-65%, no RWMA, high ventricul;ar filling pressure- Mod pulm HTN,.  Recent cardiac cath 11/2017-showed stenosis  moderate to severe, no focal targets for PCI, medical therapy. -Continue home Lasix 20 mg daily -Home antihypertensives held to allow for permissive hypertension -May need to consider decreasing dose of metoprolol with HR 40s to 60s, symptoms at this time appear to be related to CVA.  DM- Last Hgba1c- 9.9. Blood glucose- 193. -Continue home Actos metformin NPH - SS1  DVT prophylaxis: Lovenox Code Status:Full Family Communication:None at bedside Disposition Plan: per rounding team Consults called: neurology, Dr. Lorraine Lax Admission status:Transfer to [facility], for neurology eval, Obs, tele   Bethena Roys MD [facility] Pager [phone] From 6PM-2AM.  Otherwise please contact night-coverage [email or web address] Password [facility]  01/16/2018, 9:53 PM

## 2018-01-16 NOTE — ED Triage Notes (Signed)
She is here d/t feeling "kinda dizzy and just not right--trouble balancing" [sic] which she noted upon awakening this morning. She also cites recent (~ 2weeks ago) heart cath., during which "they found blockages, but no stents". She is able to ambulate and place herself onto our stretcher.

## 2018-01-16 NOTE — ED Provider Notes (Addendum)
5:40 PM CTA head/neck negative. Dizziness much better. However she describes double vision with the dizziness earlier, and thus this is concerning for possible infarct. Given history, will get MRI. If negative, d/c with symptomatic treatment.  8:33 PM MRI shows midbrain and cerebellar infarct.  Patient is feeling much better.  This area is small and no obvious bleeding or edema.  Discussed with Dr. Rory Percy, will transfer to [facility].  Given full dose aspirin.  Hospitalist to admit.   Results for orders placed or performed during the hospital encounter of 01/16/18  Protime-INR  Result Value Ref Range   Prothrombin Time 11.8 11.4 - 15.2 seconds   INR 0.88   APTT  Result Value Ref Range   aPTT 34 24 - 36 seconds  CBC  Result Value Ref Range   WBC 6.1 4.0 - 10.5 K/uL   RBC 4.98 3.87 - 5.11 MIL/uL   Hemoglobin 13.8 12.0 - 15.0 g/dL   HCT 40.9 36.0 - 46.0 %   MCV 82.1 78.0 - 100.0 fL   MCH 27.7 26.0 - 34.0 pg   MCHC 33.7 30.0 - 36.0 g/dL   RDW 17.2 (H) 11.5 - 15.5 %   Platelets 198 150 - 400 K/uL  Differential  Result Value Ref Range   Neutrophils Relative % 55 %   Neutro Abs 3.3 1.7 - 7.7 K/uL   Lymphocytes Relative 35 %   Lymphs Abs 2.1 0.7 - 4.0 K/uL   Monocytes Relative 8 %   Monocytes Absolute 0.5 0.1 - 1.0 K/uL   Eosinophils Relative 2 %   Eosinophils Absolute 0.1 0.0 - 0.7 K/uL   Basophils Relative 0 %   Basophils Absolute 0.0 0.0 - 0.1 K/uL  Comprehensive metabolic panel  Result Value Ref Range   Sodium 142 135 - 145 mmol/L   Potassium 4.2 3.5 - 5.1 mmol/L   Chloride 108 101 - 111 mmol/L   CO2 24 22 - 32 mmol/L   Glucose, Bld 193 (H) 65 - 99 mg/dL   BUN 25 (H) 6 - 20 mg/dL   Creatinine, Ser 1.20 (H) 0.44 - 1.00 mg/dL   Calcium 9.4 8.9 - 10.3 mg/dL   Total Protein 7.7 6.5 - 8.1 g/dL   Albumin 4.0 3.5 - 5.0 g/dL   AST 18 15 - 41 U/L   ALT 17 14 - 54 U/L   Alkaline Phosphatase 82 38 - 126 U/L   Total Bilirubin 0.4 0.3 - 1.2 mg/dL   GFR calc non Af Amer 45 (L) >60 mL/min    GFR calc Af Amer 52 (L) >60 mL/min   Anion gap 10 5 - 15  CBG monitoring, ED  Result Value Ref Range   Glucose-Capillary 183 (H) 65 - 99 mg/dL  I-stat troponin, ED  Result Value Ref Range   Troponin i, poc 0.01 0.00 - 0.08 ng/mL   Comment 3          CBG monitoring, ED  Result Value Ref Range   Glucose-Capillary 190 (H) 65 - 99 mg/dL  I-Stat Chem 8, ED  Result Value Ref Range   Sodium 143 135 - 145 mmol/L   Potassium 4.2 3.5 - 5.1 mmol/L   Chloride 107 101 - 111 mmol/L   BUN 24 (H) 6 - 20 mg/dL   Creatinine, Ser 1.10 (H) 0.44 - 1.00 mg/dL   Glucose, Bld 183 (H) 65 - 99 mg/dL   Calcium, Ion 1.23 1.15 - 1.40 mmol/L   TCO2 25 22 - 32 mmol/L  Hemoglobin 14.6 12.0 - 15.0 g/dL   HCT 43.0 36.0 - 46.0 %   Ct Angio Head W Or Wo Contrast  Result Date: 01/16/2018 CLINICAL DATA:  72 y/o F; dizziness with trouble balancing. History of cardiac catheterization 12/22/2017. EXAM: CT ANGIOGRAPHY HEAD AND NECK TECHNIQUE: Multidetector CT imaging of the head and neck was performed using the standard protocol during bolus administration of intravenous contrast. Multiplanar CT image reconstructions and MIPs were obtained to evaluate the vascular anatomy. Carotid stenosis measurements (when applicable) are obtained utilizing NASCET criteria, using the distal internal carotid diameter as the denominator. CONTRAST:  160 cc Isovue 370 COMPARISON:  03/23/2017 CT head.  07/06/2010 MRI head. FINDINGS: CT HEAD FINDINGS Brain: No evidence of acute infarction, hemorrhage, hydrocephalus, extra-axial collection or mass lesion/mass effect. Stable small chronic lacunar infarct in the left thalamus. Stable chronic microvascular ischemic changes of white matter and parenchymal volume loss the brain. Vascular: As below. Skull: Normal. Negative for fracture or focal lesion. Sinuses: Imaged portions are clear. Orbits: No acute finding. Review of the MIP images confirms the above findings CTA NECK FINDINGS Aortic arch: Standard  branching. Imaged portion shows no evidence of aneurysm or dissection. Post CABG and median sternotomy. Mild calcific atherosclerosis. Calcific atherosclerosis of left subclavian artery origin, partially obscured by streak artifact, probable 50% moderate underlying stenosis. Right carotid system: No evidence of dissection, stenosis (50% or greater) or occlusion. Non stenotic calcified plaque of carotid bifurcation. Left carotid system: No evidence of dissection, stenosis (50% or greater) or occlusion. Vertebral arteries: Codominant. No evidence of dissection, stenosis (50% or greater) or occlusion. Calcific atherosclerosis of vertebral artery origins with mild less than 50% stenosis. Skeleton: C5-6 anterior cervical discectomy. No acute osseous abnormality. Other neck: Negative. Upper chest: Negative. Review of the MIP images confirms the above findings CTA HEAD FINDINGS Anterior circulation: Calcific atherosclerosis of bilateral carotid siphons with mild right paraclinoid and mild-to-moderate distal left cavernous stenosis. No high-grade stenosis, proximal occlusion, aneurysm, or vascular malformation. Posterior circulation: Calcific atherosclerosis of bilateral V4 segments with mild stenosis. No additional segment of significant stenosis, proximal occlusion, aneurysm, or vascular malformation. Venous sinuses: As permitted by contrast timing, patent. Anatomic variants: No posterior communicating artery identified, likely hypoplastic or absent. Patent anterior communicating artery. Delayed phase: No abnormal intracranial enhancement. Review of the MIP images confirms the above findings IMPRESSION: CT head: 1. No acute intracranial abnormality identified. 2. Stable chronic microvascular ischemic changes and parenchymal volume loss of the brain. CTA neck: 1. Patent carotid and vertebral arteries. No significant stenosis by NASCET criteria, dissection, or occlusion. 2. Calcified plaque bilateral vertebral artery  origins with mild less than 50% stenosis. 3. Calcified plaque of left subclavian artery origin, partially obscured by streak artifact, approximately 50% mild-to-moderate stenosis. CTA head: 1. Patent anterior and posterior intracranial circulation. No high-grade stenosis, aneurysm, large vessel occlusion, or vascular malformation. 2. Multiple segments of mild stenosis in the anterior and posterior circulations as well as mild-to-moderate distal left cavernous ICA stenosis. Electronically Signed   By: Kristine Garbe M.D.   On: 01/16/2018 17:08   Dg Chest 2 View  Result Date: 12/21/2017 CLINICAL DATA:  Chest pain. EXAM: CHEST - 2 VIEW COMPARISON:  Radiographs of October 20, 2016. FINDINGS: The heart size and mediastinal contours are within normal limits. Both lungs are clear. Status post coronary artery bypass graft. No pneumothorax or pleural effusion is noted. The visualized skeletal structures are unremarkable. IMPRESSION: No active cardiopulmonary disease. Electronically Signed   By: Marijo Conception,  M.D.   On: 12/21/2017 15:26   Ct Angio Neck W And/or Wo Contrast  Result Date: 01/16/2018 CLINICAL DATA:  72 y/o F; dizziness with trouble balancing. History of cardiac catheterization 12/22/2017. EXAM: CT ANGIOGRAPHY HEAD AND NECK TECHNIQUE: Multidetector CT imaging of the head and neck was performed using the standard protocol during bolus administration of intravenous contrast. Multiplanar CT image reconstructions and MIPs were obtained to evaluate the vascular anatomy. Carotid stenosis measurements (when applicable) are obtained utilizing NASCET criteria, using the distal internal carotid diameter as the denominator. CONTRAST:  160 cc Isovue 370 COMPARISON:  03/23/2017 CT head.  07/06/2010 MRI head. FINDINGS: CT HEAD FINDINGS Brain: No evidence of acute infarction, hemorrhage, hydrocephalus, extra-axial collection or mass lesion/mass effect. Stable small chronic lacunar infarct in the left  thalamus. Stable chronic microvascular ischemic changes of white matter and parenchymal volume loss the brain. Vascular: As below. Skull: Normal. Negative for fracture or focal lesion. Sinuses: Imaged portions are clear. Orbits: No acute finding. Review of the MIP images confirms the above findings CTA NECK FINDINGS Aortic arch: Standard branching. Imaged portion shows no evidence of aneurysm or dissection. Post CABG and median sternotomy. Mild calcific atherosclerosis. Calcific atherosclerosis of left subclavian artery origin, partially obscured by streak artifact, probable 50% moderate underlying stenosis. Right carotid system: No evidence of dissection, stenosis (50% or greater) or occlusion. Non stenotic calcified plaque of carotid bifurcation. Left carotid system: No evidence of dissection, stenosis (50% or greater) or occlusion. Vertebral arteries: Codominant. No evidence of dissection, stenosis (50% or greater) or occlusion. Calcific atherosclerosis of vertebral artery origins with mild less than 50% stenosis. Skeleton: C5-6 anterior cervical discectomy. No acute osseous abnormality. Other neck: Negative. Upper chest: Negative. Review of the MIP images confirms the above findings CTA HEAD FINDINGS Anterior circulation: Calcific atherosclerosis of bilateral carotid siphons with mild right paraclinoid and mild-to-moderate distal left cavernous stenosis. No high-grade stenosis, proximal occlusion, aneurysm, or vascular malformation. Posterior circulation: Calcific atherosclerosis of bilateral V4 segments with mild stenosis. No additional segment of significant stenosis, proximal occlusion, aneurysm, or vascular malformation. Venous sinuses: As permitted by contrast timing, patent. Anatomic variants: No posterior communicating artery identified, likely hypoplastic or absent. Patent anterior communicating artery. Delayed phase: No abnormal intracranial enhancement. Review of the MIP images confirms the above  findings IMPRESSION: CT head: 1. No acute intracranial abnormality identified. 2. Stable chronic microvascular ischemic changes and parenchymal volume loss of the brain. CTA neck: 1. Patent carotid and vertebral arteries. No significant stenosis by NASCET criteria, dissection, or occlusion. 2. Calcified plaque bilateral vertebral artery origins with mild less than 50% stenosis. 3. Calcified plaque of left subclavian artery origin, partially obscured by streak artifact, approximately 50% mild-to-moderate stenosis. CTA head: 1. Patent anterior and posterior intracranial circulation. No high-grade stenosis, aneurysm, large vessel occlusion, or vascular malformation. 2. Multiple segments of mild stenosis in the anterior and posterior circulations as well as mild-to-moderate distal left cavernous ICA stenosis. Electronically Signed   By: Kristine Garbe M.D.   On: 01/16/2018 17:08   Mr Brain Wo Contrast  Result Date: 01/16/2018 CLINICAL DATA:  72 y/o  F; ataxia, stroke suspected. EXAM: MRI HEAD WITHOUT CONTRAST TECHNIQUE: Multiplanar, multiecho pulse sequences of the brain and surrounding structures were obtained without intravenous contrast. COMPARISON:  01/16/2018 CT angiogram of the head and neck. FINDINGS: Brain: Subcentimeter focus of reduced diffusion extending from the right paramedian midbrain to the right superior cerebellar peduncle (series 3, image 18-20). No associated hemorrhage or mass effect. Small chronic infarctions  are present within the left cerebellar hemisphere, upper pons, right genu of internal capsule, and left thalamus. Patchy nonspecific foci of T2 FLAIR hyperintense signal abnormality in subcortical and periventricular white matter are compatible with moderate chronic microvascular ischemic changes for age. Moderate brain parenchymal volume loss. Vascular: Normal flow voids. Skull and upper cervical spine: Normal marrow signal. Sinuses/Orbits: Negative. Other: None. IMPRESSION: 1.  Subcentimeter focus of acute/early subacute infarction extending from the right paramedian midbrain the right superior cerebellar peduncle. No associated hemorrhage or mass effect. 2. Small chronic infarctions are present within the left cerebellar hemisphere, upper pons, right genu of internal capsule, thalamus. 3. Moderate for age chronic microvascular ischemic changes and parenchymal volume loss of the brain. These results were called by telephone at the time of interpretation on 01/16/2018 at 7:43 pm to Dr. Sherwood Gambler , who verbally acknowledged these results. Electronically Signed   By: Kristine Garbe M.D.   On: 01/16/2018 19:45       Sherwood Gambler, MD 01/16/18 2033

## 2018-01-16 NOTE — ED Provider Notes (Signed)
[place] DEPT Provider Note   CSN: [id number] Arrival date & time: 01/16/18  1250     History   Chief Complaint Chief Complaint  Patient presents with  . Dizziness  . Blurred Vision    HPI Brenda Moreno is a 72 y.o. female.  Chief complaint is dizziness, vision changes.  HPI: 72 year old female.  Recent heart cath showing occlusion of 2 previous grafts.  No interventions performed.  Did not rule in.  17-year history of previous CABG. He is on aspirin but no additional boutique thinners.  No history of CVA or CVA symptoms in the past.  She awakened this morning.  5 AM.  When she got out of bed and was walking across the room she had to hold on because she felt like her legs were "off balance.  No headache.  States her vision seemed "different" denies sensation of movement.  Felt like her vision may have been double.  Her vision is normal now her symptoms have improved.  Headache.  No chest pain.  No shortness of breath.  No fall or injuries.    Past Medical History:  Diagnosis Date  . Arthritis   . CAD (coronary artery disease)    S/P cabg  . Carotid artery occlusion   . Cataract   . CHF (congestive heart failure) ([place])   . DDD (degenerative disc disease), lumbar   . Diabetes mellitus   . Hyperlipidemia   . Hypertension   . Joint pain   . Leg pain   . Myocardial infarction ([place]) 1998  . Peripheral vascular disease ([place])   . PVD (peripheral vascular disease) [facility])     Patient Active Problem List   Diagnosis Date Noted  . Chest pain 12/22/2017  . Coronary artery disease involving native coronary artery of native heart without angina pectoris 06/02/2016  . DDD (degenerative disc disease), lumbar   . Diarrhea 07/02/2014  . Fecal incontinence 07/02/2014  . Aftercare following surgery of the circulatory system, [place] 09/26/2013  . 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 Acute sinus infection 07/14/2013  .  Arthritis   . Chronic diastolic heart failure ([place])   . Diabetes mellitus, type II, insulin dependent ([place])   . Hyperlipidemia   . Hypertension   . Myocardial infarction ([place])   . PVD (peripheral vascular disease) ([place])   . Peripheral vascular disease, unspecified ([place]) 09/27/2012  . Leg cramps 09/27/2012  . Atherosclerosis of native arteries of the extremities with intermittent claudication 09/22/2011  . Diabetes mellitus ([place]) 11/22/2007  . HYPERCHOLESTEROLEMIA 11/22/2007  . Benign essential HTN 11/22/2007  . HEART ATTACK 11/22/2007  . CAD (coronary artery disease) 11/22/2007  . RENAL CALCULUS 11/22/2007    Past Surgical History:  Procedure Laterality Date  . APPENDECTOMY    . BREAST EXCISIONAL BIOPSY Left 2008  . CHOLECYSTECTOMY     Gall bladder  . COLONOSCOPY  2008  . CORONARY ARTERY BYPASS GRAFT     1998  . LEFT HEART CATH AND CORS/GRAFTS ANGIOGRAPHY N/A 12/22/2017   Procedure: LEFT HEART CATH AND CORS/GRAFTS ANGIOGRAPHY;  Surgeon: Burnell Blanks, MD;  Location: [facility];  Service: Cardiovascular;  Laterality: N/A;  . PR VEIN BYPASS GRAFT,AORTO-FEM-POP    . TUBAL LIGATION       OB History   None      Home Medications    Prior to Admission medications   Medication Sig Start Date End Date Taking? Authorizing Provider  amLODipine (  NORVASC) 5 MG tablet TAKE 1 TABLET BY MOUTH  DAILY 11/29/17  Yes Susy Frizzle, MD  aspirin EC 81 MG tablet Take 81 mg by mouth daily.   Yes [provider]  CINNAMON PO Take 1,000 mg by mouth 2 (two) times daily.     Yes [provider]  Coenzyme Q10 (CO Q 10 PO) Take 100 mg by mouth daily.     Yes [provider]  fluticasone (FLONASE) 50 MCG/ACT nasal spray Place 2 sprays into both nostrils daily as needed for allergies. 01/14/18  Yes [provider]  furosemide (LASIX) 40 MG tablet Take 20 mg by mouth daily. Patient taking 0.5 tablets po qd.   Yes [provider]  insulin  NPH-regular Human (NOVOLIN 70/30) (70-30) 100 UNIT/ML injection 15 units with breakfast and 7 units with supper 12/26/17  Yes Susy Frizzle, MD  metFORMIN (GLUCOPHAGE) 1000 MG tablet TAKE 1 TABLET BY MOUTH  TWICE A DAY WITH MEALS 11/29/17  Yes Susy Frizzle, MD  metoprolol succinate (TOPROL-XL) 100 MG 24 hr tablet TAKE 1 TABLET BY MOUTH  DAILY WITH OR IMMEDIATLEY  FOLLOWING A MEAL 12/26/17  Yes Susy Frizzle, MD  pioglitazone (ACTOS) 30 MG tablet Take 30 mg by mouth as directed.   Yes [provider]  potassium chloride (K-DUR) 10 MEQ tablet Take 1 tablet (10 mEq total) by mouth daily. 06/02/16 01/16/18 Yes Nahser, Wonda Cheng, MD  pravastatin ([name]) 80 MG tablet Take 1 tablet (80 mg total) by mouth every evening. 01/08/18 04/08/18 Yes Daune Perch, NP  quinapril (ACCUPRIL) 20 MG tablet Take 20 mg by mouth 2 (two) times daily.   Yes [provider]  nitroGLYCERIN (NITROSTAT) 0.4 MG SL tablet Place 1 tablet (0.4 mg total) under the tongue every 5 (five) minutes as needed for chest pain. 11/12/14   Susy Frizzle, MD  traMADol (ULTRAM) 50 MG tablet Take 1 tablet (50 mg total) by mouth every 8 (eight) hours as needed. Patient not taking: Reported on 01/16/2018 01/09/18   Susy Frizzle, MD    Family History Family History  Problem Relation Age of Onset  . Heart disease Mother   . Diabetes Mother   . Hyperlipidemia Mother   . Hypertension Mother   . Heart disease Father        Heart Disease before age 43  . Diabetes Father   . Hyperlipidemia Father   . Hypertension Father   . Heart disease Sister        heart attack  . Diabetes Sister        Amputation  . Hypertension Sister   . Other Sister        history of amputation  . Heart attack Sister   . Diabetes Brother   . Hypertension Brother   . Heart disease Brother 56       Before age 70  . Hyperlipidemia Brother   . Diabetes Brother        scirrosis of liver  . Coronary artery disease Other   . Colon  cancer Neg Hx   . Stomach cancer Neg Hx   . Esophageal cancer Neg Hx     Social History Social History   Tobacco Use  . Smoking status: Former Smoker    Packs/day: 0.25    Types: Cigarettes    Last attempt to quit: 09/13/2011    Years since quitting: 6.3  . Smokeless tobacco: Never Used  Substance Use Topics  .  Alcohol use: No  . Drug use: No     Allergies   Levaquin [levofloxacin in d5w]; Statins; and Tradjenta [linagliptin]   Review of Systems Review of Systems  Constitutional: Negative for appetite change, chills, diaphoresis, fatigue and fever.  HENT: Negative for mouth sores, sore throat and trouble swallowing.   Eyes: Negative for visual disturbance.  Respiratory: Negative for cough, chest tightness, shortness of breath and wheezing.   Cardiovascular: Negative for chest pain.  Gastrointestinal: Negative for abdominal distention, abdominal pain, diarrhea, nausea and vomiting.  Endocrine: Negative for polydipsia, polyphagia and polyuria.  Genitourinary: Negative for dysuria, frequency and hematuria.  Musculoskeletal: Negative for gait problem.  Skin: Negative for color change, pallor and rash.  Neurological: Positive for dizziness. Negative for syncope, light-headedness and headaches.  Hematological: Does not bruise/bleed easily.  Psychiatric/Behavioral: Negative for behavioral problems and confusion.     Physical Exam Updated Vital Signs BP (!) 155/105   Pulse (!) 55   Temp 97.8 F (36.6 C) (Oral)   Resp (!) 21   Ht 5\' 4"  (1.626 m)   Wt 83.9 kg (185 lb)   SpO2 100%   BMI 31.76 kg/m   Physical Exam  Constitutional: She is oriented to person, place, and time. She appears well-developed and well-nourished. No distress.  HENT:  Head: Normocephalic.  No nystagmus.  Eyes: Pupils are equal, round, and reactive to light. Conjunctivae are normal. No scleral icterus.  Neck: Normal range of motion. Neck supple. No thyromegaly present.  Cardiovascular: Normal  rate and regular rhythm. Exam reveals no gallop and no friction rub.  No murmur heard. Pulmonary/Chest: Effort normal and breath sounds normal. No respiratory distress. She has no wheezes. She has no rales.  Abdominal: Soft. Bowel sounds are normal. She exhibits no distension. There is no tenderness. There is no rebound.  Musculoskeletal: Normal range of motion.  Neurological: She is alert and oriented to person, place, and time.  No nystagmus.  Denies vertigo now.  Normal visual fields.  No pronator drift or leg drift.  No abnormalities on neuro exam  Skin: Skin is warm and dry. No rash noted.  Psychiatric: She has a normal mood and affect. Her behavior is normal.     ED Treatments / Results  Labs (all labs ordered are listed, but only abnormal results are displayed) Labs Reviewed  CBC - Abnormal; Notable for the following components:      Result Value   RDW 17.2 (*)    All other components within normal limits  COMPREHENSIVE METABOLIC PANEL - Abnormal; Notable for the following components:   Glucose, Bld 193 (*)    BUN 25 (*)    Creatinine, Ser 1.20 (*)    GFR calc non Af Amer 45 (*)    GFR calc Af Amer 52 (*)    All other components within normal limits  CBG MONITORING, ED - Abnormal; Notable for the following components:   Glucose-Capillary 183 (*)    All other components within normal limits  CBG MONITORING, ED - Abnormal; Notable for the following components:   Glucose-Capillary 190 (*)    All other components within normal limits  I-STAT CHEM 8, ED - Abnormal; Notable for the following components:   BUN 24 (*)    Creatinine, Ser 1.10 (*)    Glucose, Bld 183 (*)    All other components within normal limits  PROTIME-INR  APTT  DIFFERENTIAL  I-STAT TROPONIN, ED    EKG None  Radiology No results found.  Procedures Procedures (including critical care time)  Medications Ordered in ED Medications  iopamidol (ISOVUE-370) 76 % injection (has no administration in  time range)  meclizine (ANTIVERT) tablet 25 mg (25 mg Oral Given 01/16/18 1402)  iopamidol (ISOVUE-370) 76 % injection 100 mL (80 mLs Intravenous Contrast Given 01/16/18 1513)     Initial Impression / Assessment and Plan / ED Course  I have reviewed the triage vital signs and the nursing notes.  Pertinent labs & imaging results that were available during my care of the patient were reviewed by me and considered in my medical decision making (see chart for details).   Given p.o. Antivert.  Plan CT, and CT angios rule out LVO as her symptoms could be consistent with posterior circulation abnormality.  Final Clinical Impressions(s) / ED Diagnoses   Final diagnoses:  Vertigo    ED Discharge Orders    None       Tanna Furry, MD 01/16/18 1526

## 2018-01-16 NOTE — ED Triage Notes (Signed)
Pt reports waking up around 5am this morning feeling like she was unable to keep her balance, and blurry vision. Hx of recent cath lab procedure. Able to speak in full sentences, equal grips.

## 2018-01-17 ENCOUNTER — Encounter (HOSPITAL_COMMUNITY): Payer: Self-pay | Admitting: *Deleted

## 2018-01-17 ENCOUNTER — Other Ambulatory Visit (HOSPITAL_COMMUNITY): Payer: Medicare Other

## 2018-01-17 ENCOUNTER — Telehealth: Payer: Self-pay | Admitting: *Deleted

## 2018-01-17 DIAGNOSIS — E1122 Type 2 diabetes mellitus with diabetic chronic kidney disease: Secondary | ICD-10-CM | POA: Diagnosis not present

## 2018-01-17 DIAGNOSIS — E1165 Type 2 diabetes mellitus with hyperglycemia: Secondary | ICD-10-CM | POA: Diagnosis not present

## 2018-01-17 DIAGNOSIS — E1151 Type 2 diabetes mellitus with diabetic peripheral angiopathy without gangrene: Secondary | ICD-10-CM | POA: Diagnosis not present

## 2018-01-17 DIAGNOSIS — I6503 Occlusion and stenosis of bilateral vertebral arteries: Secondary | ICD-10-CM | POA: Diagnosis not present

## 2018-01-17 DIAGNOSIS — I63331 Cerebral infarction due to thrombosis of right posterior cerebral artery: Secondary | ICD-10-CM

## 2018-01-17 DIAGNOSIS — I5032 Chronic diastolic (congestive) heart failure: Secondary | ICD-10-CM

## 2018-01-17 DIAGNOSIS — I219 Acute myocardial infarction, unspecified: Secondary | ICD-10-CM | POA: Diagnosis not present

## 2018-01-17 DIAGNOSIS — I2510 Atherosclerotic heart disease of native coronary artery without angina pectoris: Secondary | ICD-10-CM | POA: Diagnosis not present

## 2018-01-17 DIAGNOSIS — R001 Bradycardia, unspecified: Secondary | ICD-10-CM | POA: Diagnosis not present

## 2018-01-17 DIAGNOSIS — E7800 Pure hypercholesterolemia, unspecified: Secondary | ICD-10-CM | POA: Diagnosis not present

## 2018-01-17 DIAGNOSIS — I672 Cerebral atherosclerosis: Secondary | ICD-10-CM | POA: Diagnosis not present

## 2018-01-17 DIAGNOSIS — I10 Essential (primary) hypertension: Secondary | ICD-10-CM | POA: Diagnosis not present

## 2018-01-17 DIAGNOSIS — N183 Chronic kidney disease, stage 3 (moderate): Secondary | ICD-10-CM | POA: Diagnosis not present

## 2018-01-17 DIAGNOSIS — E669 Obesity, unspecified: Secondary | ICD-10-CM | POA: Diagnosis not present

## 2018-01-17 DIAGNOSIS — I700 Atherosclerosis of aorta: Secondary | ICD-10-CM | POA: Diagnosis not present

## 2018-01-17 DIAGNOSIS — E118 Type 2 diabetes mellitus with unspecified complications: Secondary | ICD-10-CM | POA: Diagnosis not present

## 2018-01-17 DIAGNOSIS — I420 Dilated cardiomyopathy: Secondary | ICD-10-CM | POA: Diagnosis not present

## 2018-01-17 DIAGNOSIS — I639 Cerebral infarction, unspecified: Secondary | ICD-10-CM | POA: Diagnosis not present

## 2018-01-17 DIAGNOSIS — R2681 Unsteadiness on feet: Secondary | ICD-10-CM | POA: Diagnosis not present

## 2018-01-17 DIAGNOSIS — I6523 Occlusion and stenosis of bilateral carotid arteries: Secondary | ICD-10-CM | POA: Diagnosis not present

## 2018-01-17 DIAGNOSIS — I272 Pulmonary hypertension, unspecified: Secondary | ICD-10-CM | POA: Diagnosis not present

## 2018-01-17 DIAGNOSIS — Z87891 Personal history of nicotine dependence: Secondary | ICD-10-CM | POA: Diagnosis not present

## 2018-01-17 DIAGNOSIS — I771 Stricture of artery: Secondary | ICD-10-CM | POA: Diagnosis not present

## 2018-01-17 DIAGNOSIS — I130 Hypertensive heart and chronic kidney disease with heart failure and stage 1 through stage 4 chronic kidney disease, or unspecified chronic kidney disease: Secondary | ICD-10-CM | POA: Diagnosis not present

## 2018-01-17 DIAGNOSIS — G319 Degenerative disease of nervous system, unspecified: Secondary | ICD-10-CM | POA: Diagnosis not present

## 2018-01-17 DIAGNOSIS — I6782 Cerebral ischemia: Secondary | ICD-10-CM | POA: Diagnosis not present

## 2018-01-17 DIAGNOSIS — I051 Rheumatic mitral insufficiency: Secondary | ICD-10-CM | POA: Diagnosis not present

## 2018-01-17 LAB — LIPID PANEL
Cholesterol: 157 mg/dL (ref 0–200)
HDL: 36 mg/dL — ABNORMAL LOW (ref >40)
LDL CALC: 95 mg/dL (ref 0–99)
Total CHOL/HDL Ratio: 4.4 RATIO
Triglycerides: 131 mg/dL (ref <150)
VLDL: 26 mg/dL (ref 0–40)

## 2018-01-17 LAB — CBC WITH DIFFERENTIAL/PLATELET
Basophils Absolute: 0 10*3/uL (ref 0.0–0.1)
Basophils Relative: 1 %
EOS PCT: 3 %
Eosinophils Absolute: 0.2 10*3/uL (ref 0.0–0.7)
HCT: 35.4 % — ABNORMAL LOW (ref 36.0–46.0)
Hemoglobin: 12 g/dL (ref 12.0–15.0)
LYMPHS ABS: 2.3 10*3/uL (ref 0.7–4.0)
LYMPHS PCT: 37 %
MCH: 27.3 pg (ref 26.0–34.0)
MCHC: 33.9 g/dL (ref 30.0–36.0)
MCV: 80.5 fL (ref 78.0–100.0)
Monocytes Absolute: 0.5 10*3/uL (ref 0.1–1.0)
Monocytes Relative: 9 %
NEUTROS PCT: 52 %
Neutro Abs: 3.2 10*3/uL (ref 1.7–7.7)
Platelets: 191 10*3/uL (ref 150–400)
RBC: 4.4 MIL/uL (ref 3.87–5.11)
RDW: 17.1 % — ABNORMAL HIGH (ref 11.5–15.5)
WBC: 6.2 10*3/uL (ref 4.0–10.5)

## 2018-01-17 LAB — COMPREHENSIVE METABOLIC PANEL
ALK PHOS: 75 U/L (ref 38–126)
ALT: 15 U/L (ref 14–54)
AST: 16 U/L (ref 15–41)
Albumin: 3.2 g/dL — ABNORMAL LOW (ref 3.5–5.0)
Anion gap: 8 (ref 5–15)
BUN: 29 mg/dL — ABNORMAL HIGH (ref 6–20)
CALCIUM: 8.8 mg/dL — AB (ref 8.9–10.3)
CO2: 25 mmol/L (ref 22–32)
CREATININE: 1.31 mg/dL — AB (ref 0.44–1.00)
Chloride: 107 mmol/L (ref 101–111)
GFR calc Af Amer: 47 mL/min — ABNORMAL LOW (ref >60)
GFR, EST NON AFRICAN AMERICAN: 41 mL/min — AB (ref >60)
Glucose, Bld: 306 mg/dL — ABNORMAL HIGH (ref 65–99)
Potassium: 4.2 mmol/L (ref 3.5–5.1)
Sodium: 140 mmol/L (ref 135–145)
TOTAL PROTEIN: 6.1 g/dL — AB (ref 6.5–8.1)
Total Bilirubin: 0.4 mg/dL (ref 0.3–1.2)

## 2018-01-17 LAB — CBG MONITORING, ED
GLUCOSE-CAPILLARY: 186 mg/dL — AB (ref 65–99)
GLUCOSE-CAPILLARY: 92 mg/dL (ref 65–99)
Glucose-Capillary: 275 mg/dL — ABNORMAL HIGH (ref 65–99)
Glucose-Capillary: 110 mg/dL — ABNORMAL HIGH (ref 65–99)

## 2018-01-17 LAB — HEMOGLOBIN A1C
HEMOGLOBIN A1C: 8.9 % — AB (ref 4.8–5.6)
Mean Plasma Glucose: 208.73 mg/dL

## 2018-01-17 LAB — GLUCOSE, CAPILLARY: Glucose-Capillary: 259 mg/dL — ABNORMAL HIGH (ref 65–99)

## 2018-01-17 LAB — MAGNESIUM: MAGNESIUM: 1.9 mg/dL (ref 1.7–2.4)

## 2018-01-17 LAB — PHOSPHORUS: Phosphorus: 3.7 mg/dL (ref 2.5–4.6)

## 2018-01-17 MED ORDER — INSULIN ASPART PROT & ASPART (70-30 MIX) 100 UNIT/ML ~~LOC~~ SUSP
8.0000 [IU] | Freq: Two times a day (BID) | SUBCUTANEOUS | Status: DC
  Administered 2018-01-17 – 2018-01-19 (×5): 8 [IU] via SUBCUTANEOUS
  Filled 2018-01-17 (×2): qty 10

## 2018-01-17 MED ORDER — FLUTICASONE PROPIONATE 50 MCG/ACT NA SUSP
2.0000 | Freq: Every day | NASAL | Status: DC | PRN
  Filled 2018-01-17: qty 16

## 2018-01-17 MED ORDER — ASPIRIN 81 MG PO CHEW
81.0000 mg | CHEWABLE_TABLET | Freq: Every day | ORAL | Status: DC
  Administered 2018-01-17 – 2018-01-18 (×2): 81 mg via ORAL
  Filled 2018-01-17 (×2): qty 1

## 2018-01-17 MED ORDER — ACETAMINOPHEN 325 MG PO TABS: 650.0000 mg | ORAL_TABLET | ORAL | Status: DC | PRN

## 2018-01-17 MED ORDER — INSULIN ISOPHANE & REGULAR (HUMAN 70-30)100 UNIT/ML KWIKPEN: PEN_INJECTOR | SUBCUTANEOUS | 11 refills | Status: DC

## 2018-01-17 MED ORDER — PIOGLITAZONE HCL 30 MG PO TABS
30.0000 mg | ORAL_TABLET | Freq: Every day | ORAL | Status: DC
  Administered 2018-01-17 – 2018-01-18 (×2): 30 mg via ORAL
  Filled 2018-01-17 (×3): qty 1

## 2018-01-17 MED ORDER — SENNOSIDES-DOCUSATE SODIUM 8.6-50 MG PO TABS
1.0000 | ORAL_TABLET | Freq: Every evening | ORAL | Status: DC | PRN
Start: 2018-01-17 — End: 2018-01-19

## 2018-01-17 MED ORDER — STROKE: EARLY STAGES OF RECOVERY BOOK
Freq: Once | Status: AC
  Administered 2018-01-17: 13:00:00
  Filled 2018-01-17 (×2): qty 1

## 2018-01-17 MED ORDER — ACETAMINOPHEN 160 MG/5ML PO SOLN: 650.0000 mg | ORAL | Status: DC | PRN

## 2018-01-17 MED ORDER — INSULIN ASPART 100 UNIT/ML ~~LOC~~ SOLN
0.0000 [IU] | Freq: Three times a day (TID) | SUBCUTANEOUS | Status: DC
  Administered 2018-01-17: 5 [IU] via SUBCUTANEOUS
  Administered 2018-01-18: 7 [IU] via SUBCUTANEOUS
  Administered 2018-01-18: 1 [IU] via SUBCUTANEOUS
  Administered 2018-01-19: 2 [IU] via SUBCUTANEOUS
  Filled 2018-01-17: qty 1

## 2018-01-17 MED ORDER — METFORMIN HCL 500 MG PO TABS
1000.0000 mg | ORAL_TABLET | Freq: Two times a day (BID) | ORAL | Status: DC
Start: 2018-01-17 — End: 2018-01-18
  Administered 2018-01-17 – 2018-01-18 (×3): 1000 mg via ORAL
  Filled 2018-01-17 (×3): qty 2

## 2018-01-17 MED ORDER — EZETIMIBE 10 MG PO TABS
10.0000 mg | ORAL_TABLET | Freq: Every day | ORAL | Status: DC
Start: 2018-01-17 — End: 2018-01-18
  Administered 2018-01-17: 10 mg via ORAL
  Filled 2018-01-17: qty 1

## 2018-01-17 MED ORDER — ENOXAPARIN SODIUM 40 MG/0.4ML ~~LOC~~ SOLN
40.0000 mg | SUBCUTANEOUS | Status: DC
  Administered 2018-01-17 – 2018-01-18 (×2): 40 mg via SUBCUTANEOUS
  Filled 2018-01-17 (×2): qty 0.4

## 2018-01-17 MED ORDER — FUROSEMIDE 20 MG PO TABS
20.0000 mg | ORAL_TABLET | Freq: Every day | ORAL | Status: DC
  Administered 2018-01-17 – 2018-01-18 (×2): 20 mg via ORAL
  Filled 2018-01-17 (×2): qty 1

## 2018-01-17 MED ORDER — ACETAMINOPHEN 650 MG RE SUPP: 650.0000 mg | RECTAL | Status: DC | PRN

## 2018-01-17 MED ORDER — POTASSIUM CHLORIDE CRYS ER 10 MEQ PO TBCR
10.0000 meq | EXTENDED_RELEASE_TABLET | Freq: Every day | ORAL | Status: DC
  Administered 2018-01-17 – 2018-01-18 (×2): 10 meq via ORAL
  Filled 2018-01-17 (×2): qty 1

## 2018-01-17 NOTE — Progress Notes (Signed)
PROGRESS NOTE    Brenda Moreno  BSJ:[id number] DOB: November 28, 1945 DOA: 01/16/2018 PCP: Susy Frizzle, MD   Brief Narrative:  Brenda Moreno is an obese 72 y.o. female with medical history significant for DM2, HTN, Diastolic CHF, CABG, PVD, who presented to the ED with complaints of unstable gait that started at about 5 AM this morning when she got up to use the bathroom.  Patient reported she felt like she was moving sideways. She denies lightheadedness or sensation of room spinning.  She has also endorses double vision that started at about the same time, that improves when she closes one eye.   MRI brain done without contrast showed a subcentimeter focus of acute/early subacute infarction extending from right paramedian midbrain to the right superior cerebellar peduncle.  Neurology was consulted- Dr Lorraine Lax, recommended admission to [facility] hospitalist service for CVA workup.  Assessment & Plan:   Principal Problem:   CVA (cerebral vascular accident) ([place]) Active Problems:   Diabetes mellitus ([place])   HYPERCHOLESTEROLEMIA   Benign essential HTN   Peripheral vascular disease, unspecified ([place])   Chronic diastolic heart failure (H[facility])   Myocardial infarction ([place])  Acute/Subacute CVA with Symptoms of Unsteady gait.  -Not given tPA because outside of window  -CT of the Head showed No acute intracranial abnormality identified. Stable chronic microvascular ischemic changes and parenchymal volume loss of the brain  -CTA Head showed Patent anterior and posterior intracranial circulation. No high-grade stenosis, aneurysm, large vessel occlusion, or vascular malformation. Multiple segments of mild stenosis in the anterior and posterior circulations as well as mild-to-moderate distal left cavernous ICA stenosis. -CTA of Neck showed Patent carotid and vertebral arteries. No significant stenosis by NASCET criteria, dissection, or occlusion. Calcified plaque bilateral vertebral artery origins with  mild less than 50% stenosis. Calcified plaque of left subclavian artery origin, partially obscured by streak artifact, approximately 50% mild-to-moderate stenosis. -MRI brain subcentimeter focus of acute/early subacute infarction extending from right paramedian midbrain to right superior cerebellar peduncle, chronic small infarctions. Small chronic infarctions are present within the left cerebellar nhemisphere, upper pons, right genu of internal capsule, thalamus. Moderate for age chronic microvascular ischemic changes and parenchymal volume loss of the brain. -Patient was complaint with aspirin 81mg  daily.   -Patient has not tolerated statins Lipitor or Crestor in the past-reported lower extremities "giving way".  Was restarted on pravastatin recently and has experienced same effect, so patient discontinued medication 2 days ago. Patient appears to be a vasculopath and would benefit from statin therapy.  -ASA 325 given in ED and will be continued per Neurology  -Neurology Dr. Rory Percy consulted and recommending continuing CVA work up and transfer to [facility] for completion and evaluation by Stroke Team  -Continue to Hold antihypertensives to allow for permissive hypertension in the setting of recent CVA -Transthoracic Echocardiogram ordered -Lipid Panel done recently 12/22/2017- LDL- 167, TG- 182, Total Chol 244, HDL- 41. -Repeat lipid panel showed cholesterol level 157, HDL 36, LDL of 95, TG of 131, and VLDL of 26 -Hgba1c done recently on  12/22/2017- 9.9 -PT/OT/SLP evaluations -Continue with neurochecks per protocol and continue telemetry -Per neurology hold statin for now and further recommendations and cholesterol lower medication to be started by neurology stroke team -Decision for dual antiplatelet therapy currently being deferred to stroke team -Currently in the process of being transferred to [facility]  HTN -Elevated blood pressure systolic 765Y to 650P -Allow for permissive hypertension  in the setting of recent CVA -Currently holding  hold home Amlodipine 5 mg p.o. daily, Metoprolol Succinate 100 mg daily, as well as Quinapril 20 mg p.o. twice daily -We will need to add back her home blood pressure medications slowly  CAD status post CABG -Aspirin 81 mg increased to 325 daily by neurology due to stroke -Currently holding metoprolol succinate 100 mg daily, quinapril 20 mg p.o. twice daily, as well as pravastatin 80 mg p.o. nightly -We will add back nitroglycerin 0.4 mg sublingual tablets every 5 minutes as needed for chest pain -Currently denying chest pain at this time -She had a recent cardiac catheterization on 12/22/2017 which showed severe triple-vessel CAD status post 6 vessel CABG with 5 out of 6 pain grafts.  The LAD had severe proximal stenosis.  The LIMA graft to the mid LAD is patent and seem to fill antegrade and also retrograde from the native coronary injection.  The vein grafts to both diagonal branches are patent.  Circumflex had moderate proximal stenosis.  This vessel terminates into a small OM branch.  The vein graft to the OM branch is known to be occluded.  The RCA has moderate mid stenosis and then severe stenosis in the proximal segment of the PLA and PDA.  The sequential vein graft to the PDA and PLA is patent.  Per cardiology there is no focal targets for PCI  Chronic Diastolic CHF -Appears euvolemic , no chest pain, recent ECHO - 12/23/17-Ef- 60-65%, no RWMA, high ventricul;ar filling pressure- Mod pulm HTN. -Continue home Lasix 20 mg daily -Home antihypertensives held as above to allow for permissive hypertension  Bradycardia -May need to consider decreasing dose of metoprolol with HR 40s to 60s, symptoms at this time appear to be related to CVA. -Currently metoprolol succinate 100 mg every 24 is being held due to to her CVA  Uncontrolled Insulin Dependent Diabetes Mellitus Type 2 -Last Hgba1c- 9.9.  -Currently receiving 8 units of NovoLog 70/30 mix  twice daily with meals.  She takes Novolin 70/30 15 units with breakfast and 7 units with supper at home. -Currently her home medications of Metformin 1000 milligrams p.o. twice daily and Pioglitazone 30 mg p.o. daily have been continued -Continue with Sensitive NovoLog sliding scale insulin before meals -CBGs currently ranging from 1 86-2 7 5   CKD Stage III -BUN/creatinine went from 24/1.10 and is now 29/1.39 -Avoid nephrotoxic medications if possible -Continue to monitor renal function and repeat CMP in a.m.  DVT prophylaxis: Enoxaparin 40 mg sq q24h Code Status: FULL CODE Family Communication: No family present at the bedside Disposition Plan: Transfer to [facility] for further work-up and evaluation by stroke team.  Consultants:   Neurology Dr. Rory Percy   Procedures:  ECHOCARDIOGRAM (ordered and pending)  Antimicrobials:  Anti-infectives (From admission, onward)   None     Subjective: Seen and examined and stated that she felt like she is weak on the right side.  No chest pain, shortness of breath, nausea, vomiting.  No lightheadedness or dizziness.  No other concerns or complaints and understands she will be going to [facility] for continued work-up.  Objective: Vitals:   01/17/18 0445 01/17/18 0545 01/17/18 0700 01/17/18 0745  BP: (!) 152/72 (!) 139/50 (!) 140/53 (!) 166/62  Pulse: (!) 55 (!) 51 (!) 50 (!) 47  Resp: (!) 23 (!) 21 17 18   Temp:      TempSrc:      SpO2: 97% 96% 96% 98%  Weight:      Height:  No intake or output data in the 24 hours ending 01/17/18 0809 Filed Weights   01/16/18 1305  Weight: 83.9 kg (185 lb)   Examination: Physical Exam:  Constitutional: WN/WD obese AAF who is in NAD and appears calm and comfortable Eyes: Lds and conjunctivae normal, sclerae anicteric  ENMT: External Ears, Nose appear normal. Grossly normal hearing.  Neck: Appears normal, supple, no cervical masses, normal ROM, no appreciable thyromegaly; no JVD Respiratory:  Diminished to auscultation bilaterally, no wheezing, rales, rhonchi or crackles. Normal respiratory effort and patient is not tachypenic. No accessory muscle use.  Cardiovascular: RRR, no murmurs / rubs / gallops. S1 and S2 auscultated. Trace extremity edema.   Abdomen: Soft, non-tender, non-distended. No masses palpated. No appreciable hepatosplenomegaly. Bowel sounds positive x4.  GU: Deferred. Musculoskeletal: No clubbing / cyanosis of digits/nails. No joint deformity upper and lower extremities.  Skin: No rashes, lesions, ulcers on a limited skin eval. No induration; Warm and dry.  Neurologic: CN 2-12 grossly intact with no focal deficits. Slightly weaker on right compared to left. Romberg sign and cerebellar reflexes not assessed.  Psychiatric: Normal judgment and insight. Alert and oriented x 3. Normal mood and appropriate affect.   Data Reviewed: I have personally reviewed following labs and imaging studies  CBC: Recent Labs  Lab 01/16/18 1319 01/16/18 1327  WBC 6.1  --   NEUTROABS 3.3  --   HGB 13.8 14.6  HCT 40.9 43.0  MCV 82.1  --   PLT 198  --    Basic Metabolic Panel: Recent Labs  Lab 01/16/18 1319 01/16/18 1327  NA 142 143  K 4.2 4.2  CL 108 107  CO2 24  --   GLUCOSE 193* 183*  BUN 25* 24*  CREATININE 1.20* 1.10*  CALCIUM 9.4  --    GFR: Estimated Creatinine Clearance: 50.6 mL/min (A) (by C-G formula based on SCr of 1.1 mg/dL (H)). Liver Function Tests: Recent Labs  Lab 01/16/18 1319  AST 18  ALT 17  ALKPHOS 82  BILITOT 0.4  PROT 7.7  ALBUMIN 4.0   No results for input(s): LIPASE, AMYLASE in the last 168 hours. No results for input(s): AMMONIA in the last 168 hours. Coagulation Profile: Recent Labs  Lab 01/16/18 1319  INR 0.88   Cardiac Enzymes: No results for input(s): CKTOTAL, CKMB, CKMBINDEX, TROPONINI in the last 168 hours. BNP (last 3 results) No results for input(s): PROBNP in the last 8760 hours. HbA1C: No results for input(s): HGBA1C  in the last 72 hours. CBG: Recent Labs  Lab 01/16/18 1300 01/16/18 1334 01/17/18 0202  GLUCAP 183* 190* 186*   Lipid Profile: No results for input(s): CHOL, HDL, LDLCALC, TRIG, CHOLHDL, LDLDIRECT in the last 72 hours. Thyroid Function Tests: No results for input(s): TSH, T4TOTAL, FREET4, T3FREE, THYROIDAB in the last 72 hours. Anemia Panel: No results for input(s): VITAMINB12, FOLATE, FERRITIN, TIBC, IRON, RETICCTPCT in the last 72 hours. Sepsis Labs: No results for input(s): PROCALCITON, LATICACIDVEN in the last 168 hours.  No results found for this or any previous visit (from the past 240 hour(s)).   Radiology Studies: Ct Angio Head W Or Wo Contrast  Result Date: 01/16/2018 CLINICAL DATA:  72 y/o F; dizziness with trouble balancing. History of cardiac catheterization 12/22/2017. EXAM: CT ANGIOGRAPHY HEAD AND NECK TECHNIQUE: Multidetector CT imaging of the head and neck was performed using the standard protocol during bolus administration of intravenous contrast. Multiplanar CT image reconstructions and MIPs were obtained to evaluate the vascular anatomy. Carotid stenosis  measurements (when applicable) are obtained utilizing NASCET criteria, using the distal internal carotid diameter as the denominator. CONTRAST:  160 cc Isovue 370 COMPARISON:  03/23/2017 CT head.  07/06/2010 MRI head. FINDINGS: CT HEAD FINDINGS Brain: No evidence of acute infarction, hemorrhage, hydrocephalus, extra-axial collection or mass lesion/mass effect. Stable small chronic lacunar infarct in the left thalamus. Stable chronic microvascular ischemic changes of white matter and parenchymal volume loss the brain. Vascular: As below. Skull: Normal. Negative for fracture or focal lesion. Sinuses: Imaged portions are clear. Orbits: No acute finding. Review of the MIP images confirms the above findings CTA NECK FINDINGS Aortic arch: Standard branching. Imaged portion shows no evidence of aneurysm or dissection. Post CABG and  median sternotomy. Mild calcific atherosclerosis. Calcific atherosclerosis of left subclavian artery origin, partially obscured by streak artifact, probable 50% moderate underlying stenosis. Right carotid system: No evidence of dissection, stenosis (50% or greater) or occlusion. Non stenotic calcified plaque of carotid bifurcation. Left carotid system: No evidence of dissection, stenosis (50% or greater) or occlusion. Vertebral arteries: Codominant. No evidence of dissection, stenosis (50% or greater) or occlusion. Calcific atherosclerosis of vertebral artery origins with mild less than 50% stenosis. Skeleton: C5-6 anterior cervical discectomy. No acute osseous abnormality. Other neck: Negative. Upper chest: Negative. Review of the MIP images confirms the above findings CTA HEAD FINDINGS Anterior circulation: Calcific atherosclerosis of bilateral carotid siphons with mild right paraclinoid and mild-to-moderate distal left cavernous stenosis. No high-grade stenosis, proximal occlusion, aneurysm, or vascular malformation. Posterior circulation: Calcific atherosclerosis of bilateral V4 segments with mild stenosis. No additional segment of significant stenosis, proximal occlusion, aneurysm, or vascular malformation. Venous sinuses: As permitted by contrast timing, patent. Anatomic variants: No posterior communicating artery identified, likely hypoplastic or absent. Patent anterior communicating artery. Delayed phase: No abnormal intracranial enhancement. Review of the MIP images confirms the above findings IMPRESSION: CT head: 1. No acute intracranial abnormality identified. 2. Stable chronic microvascular ischemic changes and parenchymal volume loss of the brain. CTA neck: 1. Patent carotid and vertebral arteries. No significant stenosis by NASCET criteria, dissection, or occlusion. 2. Calcified plaque bilateral vertebral artery origins with mild less than 50% stenosis. 3. Calcified plaque of left subclavian artery  origin, partially obscured by streak artifact, approximately 50% mild-to-moderate stenosis. CTA head: 1. Patent anterior and posterior intracranial circulation. No high-grade stenosis, aneurysm, large vessel occlusion, or vascular malformation. 2. Multiple segments of mild stenosis in the anterior and posterior circulations as well as mild-to-moderate distal left cavernous ICA stenosis. Electronically Signed   By: Kristine Garbe M.D.   On: 01/16/2018 17:08   Ct Angio Neck W And/or Wo Contrast  Result Date: 01/16/2018 CLINICAL DATA:  72 y/o F; dizziness with trouble balancing. History of cardiac catheterization 12/22/2017. EXAM: CT ANGIOGRAPHY HEAD AND NECK TECHNIQUE: Multidetector CT imaging of the head and neck was performed using the standard protocol during bolus administration of intravenous contrast. Multiplanar CT image reconstructions and MIPs were obtained to evaluate the vascular anatomy. Carotid stenosis measurements (when applicable) are obtained utilizing NASCET criteria, using the distal internal carotid diameter as the denominator. CONTRAST:  160 cc Isovue 370 COMPARISON:  03/23/2017 CT head.  07/06/2010 MRI head. FINDINGS: CT HEAD FINDINGS Brain: No evidence of acute infarction, hemorrhage, hydrocephalus, extra-axial collection or mass lesion/mass effect. Stable small chronic lacunar infarct in the left thalamus. Stable chronic microvascular ischemic changes of white matter and parenchymal volume loss the brain. Vascular: As below. Skull: Normal. Negative for fracture or focal lesion. Sinuses: Imaged portions are clear.  Orbits: No acute finding. Review of the MIP images confirms the above findings CTA NECK FINDINGS Aortic arch: Standard branching. Imaged portion shows no evidence of aneurysm or dissection. Post CABG and median sternotomy. Mild calcific atherosclerosis. Calcific atherosclerosis of left subclavian artery origin, partially obscured by streak artifact, probable 50% moderate  underlying stenosis. Right carotid system: No evidence of dissection, stenosis (50% or greater) or occlusion. Non stenotic calcified plaque of carotid bifurcation. Left carotid system: No evidence of dissection, stenosis (50% or greater) or occlusion. Vertebral arteries: Codominant. No evidence of dissection, stenosis (50% or greater) or occlusion. Calcific atherosclerosis of vertebral artery origins with mild less than 50% stenosis. Skeleton: C5-6 anterior cervical discectomy. No acute osseous abnormality. Other neck: Negative. Upper chest: Negative. Review of the MIP images confirms the above findings CTA HEAD FINDINGS Anterior circulation: Calcific atherosclerosis of bilateral carotid siphons with mild right paraclinoid and mild-to-moderate distal left cavernous stenosis. No high-grade stenosis, proximal occlusion, aneurysm, or vascular malformation. Posterior circulation: Calcific atherosclerosis of bilateral V4 segments with mild stenosis. No additional segment of significant stenosis, proximal occlusion, aneurysm, or vascular malformation. Venous sinuses: As permitted by contrast timing, patent. Anatomic variants: No posterior communicating artery identified, likely hypoplastic or absent. Patent anterior communicating artery. Delayed phase: No abnormal intracranial enhancement. Review of the MIP images confirms the above findings IMPRESSION: CT head: 1. No acute intracranial abnormality identified. 2. Stable chronic microvascular ischemic changes and parenchymal volume loss of the brain. CTA neck: 1. Patent carotid and vertebral arteries. No significant stenosis by NASCET criteria, dissection, or occlusion. 2. Calcified plaque bilateral vertebral artery origins with mild less than 50% stenosis. 3. Calcified plaque of left subclavian artery origin, partially obscured by streak artifact, approximately 50% mild-to-moderate stenosis. CTA head: 1. Patent anterior and posterior intracranial circulation. No  high-grade stenosis, aneurysm, large vessel occlusion, or vascular malformation. 2. Multiple segments of mild stenosis in the anterior and posterior circulations as well as mild-to-moderate distal left cavernous ICA stenosis. Electronically Signed   By: Kristine Garbe M.D.   On: 01/16/2018 17:08   Mr Brain Wo Contrast  Result Date: 01/16/2018 CLINICAL DATA:  72 y/o  F; ataxia, stroke suspected. EXAM: MRI HEAD WITHOUT CONTRAST TECHNIQUE: Multiplanar, multiecho pulse sequences of the brain and surrounding structures were obtained without intravenous contrast. COMPARISON:  01/16/2018 CT angiogram of the head and neck. FINDINGS: Brain: Subcentimeter focus of reduced diffusion extending from the right paramedian midbrain to the right superior cerebellar peduncle (series 3, image [id number]). No associated hemorrhage or mass effect. Small chronic infarctions are present within the left cerebellar hemisphere, upper pons, right genu of internal capsule, and left thalamus. Patchy nonspecific foci of T2 FLAIR hyperintense signal abnormality in subcortical and periventricular white matter are compatible with moderate chronic microvascular ischemic changes for age. Moderate brain parenchymal volume loss. Vascular: Normal flow voids. Skull and upper cervical spine: Normal marrow signal. Sinuses/Orbits: Negative. Other: None. IMPRESSION: 1. Subcentimeter focus of acute/early subacute infarction extending from the right paramedian midbrain the right superior cerebellar peduncle. No associated hemorrhage or mass effect. 2. Small chronic infarctions are present within the left cerebellar hemisphere, upper pons, right genu of internal capsule, thalamus. 3. Moderate for age chronic microvascular ischemic changes and parenchymal volume loss of the brain. These results were called by telephone at the time of interpretation on 01/16/2018 at 7:43 pm to Dr. Sherwood Gambler , who verbally acknowledged these results. Electronically  Signed   By: Kristine Garbe M.D.   On: 01/16/2018 19:45  Scheduled Meds: .  stroke: mapping our early stages of recovery book   Does not apply Once  . aspirin  81 mg Oral Daily  . enoxaparin (LOVENOX) injection  40 mg Subcutaneous Q24H  . furosemide  20 mg Oral Daily  . insulin aspart  0-9 Units Subcutaneous TID WC  . insulin aspart protamine- aspart  8 Units Subcutaneous BID WC  . metFORMIN  1,000 mg Oral BID WC  . pioglitazone  30 mg Oral Daily  . potassium chloride  10 mEq Oral Daily   Continuous Infusions:   LOS: 0 days   Kerney Elbe, DO [facility] Pager [phone]  If 7PM-7AM, please contact night-coverage [email or web address] Password [email or web address] 01/17/2018, 8:09 AM

## 2018-01-17 NOTE — Progress Notes (Signed)
STROKE TEAM PROGRESS NOTE   INTERVAL HISTORY Patient was seen at [facility] long. Patient in ER bed, awake, alert, can tell me the month/ [place] to be transferred to [place], and is a good historian. Patient in no acute distress and eating breakfast. States that she is no longer seeing double. Patient admits that prior to this event she was off of all DM medications, and her cholesterol medication due to the cost of the medications.Reports allergy to all statins-myalgias.  Has been recently started on a statin and is experiencing lower extremity pain.     Vitals:   01/17/18 1130 01/17/18 1145 01/17/18 1200 01/17/18 1215  BP: (!) 151/56 (!) 149/55 (!) 156/58 (!) 148/59  Pulse: (!) 46 (!) 52 (!) 54 (!) 54  Resp: (!) 22 (!) 21 15 (!) 23  Temp:      TempSrc:      SpO2: 99% 99% 100% 92%  Weight:      Height:        CBC:  Recent Labs  Lab 01/16/18 1319 01/16/18 1327 01/17/18 0909  WBC 6.1  --  6.2  NEUTROABS 3.3  --  3.2  HGB 13.8 14.6 12.0  HCT 40.9 43.0 35.4*  MCV 82.1  --  80.5  PLT 198  --  242    Basic Metabolic Panel:  Recent Labs  Lab 01/16/18 1319 01/16/18 1327 01/17/18 0909  NA 142 143 140  K 4.2 4.2 4.2  CL 108 107 107  CO2 24  --  25  GLUCOSE 193* 183* 306*  BUN 25* 24* 29*  CREATININE 1.20* 1.10* 1.31*  CALCIUM 9.4  --  8.8*  MG  --   --  1.9  PHOS  --   --  3.7   Lipid Panel:     Component Value Date/Time   CHOL 157 01/17/2018 0909   TRIG 131 01/17/2018 0909   HDL 36 (L) 01/17/2018 0909   CHOLHDL 4.4 01/17/2018 0909   VLDL 26 01/17/2018 0909   LDLCALC 95 01/17/2018 0909   HgbA1c:  Lab Results  Component Value Date   HGBA1C 9.9 (H) 12/22/2017   Urine Drug Screen:     Component Value Date/Time   LABOPIA NONE DETECTED 03/20/2017 1211   COCAINSCRNUR NONE DETECTED 03/20/2017 1211   LABBENZ NONE DETECTED 03/20/2017 1211   AMPHETMU NONE DETECTED 03/20/2017 1211   THCU NONE DETECTED 03/20/2017 1211   LABBARB NONE DETECTED 03/20/2017 1211     Alcohol Level No results found for: ETH  IMAGING Ct Angio Head W Or Wo Contrast  Result Date: 01/16/2018 CLINICAL DATA:  72 y/o F; dizziness with trouble balancing. History of cardiac catheterization 12/22/2017. EXAM: CT ANGIOGRAPHY HEAD AND NECK TECHNIQUE: Multidetector CT imaging of the head and neck was performed using the standard protocol during bolus administration of intravenous contrast. Multiplanar CT image reconstructions and MIPs were obtained to evaluate the vascular anatomy. Carotid stenosis measurements (when applicable) are obtained utilizing NASCET criteria, using the distal internal carotid diameter as the denominator. CONTRAST:  160 cc Isovue 370 COMPARISON:  03/23/2017 CT head.  07/06/2010 MRI head. FINDINGS: CT HEAD FINDINGS Brain: No evidence of acute infarction, hemorrhage, hydrocephalus, extra-axial collection or mass lesion/mass effect. Stable small chronic lacunar infarct in the left thalamus. Stable chronic microvascular ischemic changes of white matter and parenchymal volume loss the brain. Vascular: As below. Skull: Normal. Negative for fracture or focal lesion. Sinuses: Imaged portions are clear. Orbits: No acute finding. Review of the MIP images  confirms the above findings CTA NECK FINDINGS Aortic arch: Standard branching. Imaged portion shows no evidence of aneurysm or dissection. Post CABG and median sternotomy. Mild calcific atherosclerosis. Calcific atherosclerosis of left subclavian artery origin, partially obscured by streak artifact, probable 50% moderate underlying stenosis. Right carotid system: No evidence of dissection, stenosis (50% or greater) or occlusion. Non stenotic calcified plaque of carotid bifurcation. Left carotid system: No evidence of dissection, stenosis (50% or greater) or occlusion. Vertebral arteries: Codominant. No evidence of dissection, stenosis (50% or greater) or occlusion. Calcific atherosclerosis of vertebral artery origins with mild less than  50% stenosis. Skeleton: C5-6 anterior cervical discectomy. No acute osseous abnormality. Other neck: Negative. Upper chest: Negative. Review of the MIP images confirms the above findings CTA HEAD FINDINGS Anterior circulation: Calcific atherosclerosis of bilateral carotid siphons with mild right paraclinoid and mild-to-moderate distal left cavernous stenosis. No high-grade stenosis, proximal occlusion, aneurysm, or vascular malformation. Posterior circulation: Calcific atherosclerosis of bilateral V4 segments with mild stenosis. No additional segment of significant stenosis, proximal occlusion, aneurysm, or vascular malformation. Venous sinuses: As permitted by contrast timing, patent. Anatomic variants: No posterior communicating artery identified, likely hypoplastic or absent. Patent anterior communicating artery. Delayed phase: No abnormal intracranial enhancement. Review of the MIP images confirms the above findings IMPRESSION: CT head: 1. No acute intracranial abnormality identified. 2. Stable chronic microvascular ischemic changes and parenchymal volume loss of the brain. CTA neck: 1. Patent carotid and vertebral arteries. No significant stenosis by NASCET criteria, dissection, or occlusion. 2. Calcified plaque bilateral vertebral artery origins with mild less than 50% stenosis. 3. Calcified plaque of left subclavian artery origin, partially obscured by streak artifact, approximately 50% mild-to-moderate stenosis. CTA head: 1. Patent anterior and posterior intracranial circulation. No high-grade stenosis, aneurysm, large vessel occlusion, or vascular malformation. 2. Multiple segments of mild stenosis in the anterior and posterior circulations as well as mild-to-moderate distal left cavernous ICA stenosis. Electronically Signed   By: Kristine Garbe M.D.   On: 01/16/2018 17:08   Ct Angio Neck W And/or Wo Contrast  Result Date: 01/16/2018 CLINICAL DATA:  72 y/o F; dizziness with trouble balancing.  History of cardiac catheterization 12/22/2017. EXAM: CT ANGIOGRAPHY HEAD AND NECK TECHNIQUE: Multidetector CT imaging of the head and neck was performed using the standard protocol during bolus administration of intravenous contrast. Multiplanar CT image reconstructions and MIPs were obtained to evaluate the vascular anatomy. Carotid stenosis measurements (when applicable) are obtained utilizing NASCET criteria, using the distal internal carotid diameter as the denominator. CONTRAST:  160 cc Isovue 370 COMPARISON:  03/23/2017 CT head.  07/06/2010 MRI head. FINDINGS: CT HEAD FINDINGS Brain: No evidence of acute infarction, hemorrhage, hydrocephalus, extra-axial collection or mass lesion/mass effect. Stable small chronic lacunar infarct in the left thalamus. Stable chronic microvascular ischemic changes of white matter and parenchymal volume loss the brain. Vascular: As below. Skull: Normal. Negative for fracture or focal lesion. Sinuses: Imaged portions are clear. Orbits: No acute finding. Review of the MIP images confirms the above findings CTA NECK FINDINGS Aortic arch: Standard branching. Imaged portion shows no evidence of aneurysm or dissection. Post CABG and median sternotomy. Mild calcific atherosclerosis. Calcific atherosclerosis of left subclavian artery origin, partially obscured by streak artifact, probable 50% moderate underlying stenosis. Right carotid system: No evidence of dissection, stenosis (50% or greater) or occlusion. Non stenotic calcified plaque of carotid bifurcation. Left carotid system: No evidence of dissection, stenosis (50% or greater) or occlusion. Vertebral arteries: Codominant. No evidence of dissection, stenosis (50% or greater)  or occlusion. Calcific atherosclerosis of vertebral artery origins with mild less than 50% stenosis. Skeleton: C5-6 anterior cervical discectomy. No acute osseous abnormality. Other neck: Negative. Upper chest: Negative. Review of the MIP images confirms the  above findings CTA HEAD FINDINGS Anterior circulation: Calcific atherosclerosis of bilateral carotid siphons with mild right paraclinoid and mild-to-moderate distal left cavernous stenosis. No high-grade stenosis, proximal occlusion, aneurysm, or vascular malformation. Posterior circulation: Calcific atherosclerosis of bilateral V4 segments with mild stenosis. No additional segment of significant stenosis, proximal occlusion, aneurysm, or vascular malformation. Venous sinuses: As permitted by contrast timing, patent. Anatomic variants: No posterior communicating artery identified, likely hypoplastic or absent. Patent anterior communicating artery. Delayed phase: No abnormal intracranial enhancement. Review of the MIP images confirms the above findings IMPRESSION: CT head: 1. No acute intracranial abnormality identified. 2. Stable chronic microvascular ischemic changes and parenchymal volume loss of the brain. CTA neck: 1. Patent carotid and vertebral arteries. No significant stenosis by NASCET criteria, dissection, or occlusion. 2. Calcified plaque bilateral vertebral artery origins with mild less than 50% stenosis. 3. Calcified plaque of left subclavian artery origin, partially obscured by streak artifact, approximately 50% mild-to-moderate stenosis. CTA head: 1. Patent anterior and posterior intracranial circulation. No high-grade stenosis, aneurysm, large vessel occlusion, or vascular malformation. 2. Multiple segments of mild stenosis in the anterior and posterior circulations as well as mild-to-moderate distal left cavernous ICA stenosis. Electronically Signed   By: Kristine Garbe M.D.   On: 01/16/2018 17:08   Mr Brain Wo Contrast  Result Date: 01/16/2018 CLINICAL DATA:  72 y/o  F; ataxia, stroke suspected. EXAM: MRI HEAD WITHOUT CONTRAST TECHNIQUE: Multiplanar, multiecho pulse sequences of the brain and surrounding structures were obtained without intravenous contrast. COMPARISON:  01/16/2018 CT  angiogram of the head and neck. FINDINGS: Brain: Subcentimeter focus of reduced diffusion extending from the right paramedian midbrain to the right superior cerebellar peduncle (series 3, image [id number]). No associated hemorrhage or mass effect. Small chronic infarctions are present within the left cerebellar hemisphere, upper pons, right genu of internal capsule, and left thalamus. Patchy nonspecific foci of T2 FLAIR hyperintense signal abnormality in subcortical and periventricular white matter are compatible with moderate chronic microvascular ischemic changes for age. Moderate brain parenchymal volume loss. Vascular: Normal flow voids. Skull and upper cervical spine: Normal marrow signal. Sinuses/Orbits: Negative. Other: None. IMPRESSION: 1. Subcentimeter focus of acute/early subacute infarction extending from the right paramedian midbrain the right superior cerebellar peduncle. No associated hemorrhage or mass effect. 2. Small chronic infarctions are present within the left cerebellar hemisphere, upper pons, right genu of internal capsule, thalamus. 3. Moderate for age chronic microvascular ischemic changes and parenchymal volume loss of the brain. These results were called by telephone at the time of interpretation on 01/16/2018 at 7:43 pm to Dr. Sherwood Gambler , who verbally acknowledged these results. Electronically Signed   By: Kristine Garbe M.D.   On: 01/16/2018 19:45    PHYSICAL EXAM General: awake, alert, NAD HEENT: Normocephalic, mucous membranes moist and pink Lungs: saturation WNL, no work of breathing noted. Extremities: warm and well perfused, intact pulses  Neuro:  Mental Status: Alert, oriented, thought content appropriate.  Speech fluent without evidence of aphasia.  Able to follow 3 step commands without difficulty. Denies double vision. Cranial Nerves: II: ; Visual fields grossly normal, cataracts noted bilaterally III,IV, VI: ptosis not present, extra-ocular motions intact  bilaterally pupils equal, round, reactive to light and accommodation V,VII: smile symmetric, facial light touch sensation normal bilaterally VIII: hearing normal  bilaterally IX,X: uvula rises symmetrically XI: bilateral shoulder shrug XII: midline tongue extension Motor: Right : Upper extremity   4/5    Left:     Upper extremity   5/5  Lower extremity   4/5     Lower extremity   5/5 Tone and bulk:normal tone throughout; no atrophy noted Sensory: light touch intact throughout, bilaterally Deep Tendon Reflexes: 2+ and symmetric throughout Plantars: Right: downgoing   Left: downgoing Cerebellar: normal finger-to-nose, normal rapid alternating movements and normal heel-to-shin test Gait: did not assess   ASSESSMENT/PLAN 72 year old with multiple cerebrovascular risk factors presenting with unsteady gait. Neurological examination unremarkable.MRI suggestive of a subcentimeter area of early subacute infarction in the right paramedian midbrain and right superior cerebellar peduncle-likely small vessel etiology from uncontrolled hypertension diabetes.    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  Resultant in diplopia, right sided weakness  CT head :Stable chronic microvasuclar ischemic changes. NO acute intracranial abnormality   CTA head: multiple segments of mild stenosis in anterior and posterior circulations and mild-to-moderate distal left cavernous ICA; patent anterior and posterior circulation.   CTA neck  Bilateral vertebral artery with mild less than 50% stenosis; left subclavian artery approximately. 50% mild to moderate stenosis  MRI : Ischemic changes, and acute/subabute infarction extending from the right paramedian midbrain, right superior cerebellar peduncle.  2D Echo pending  LDL 95  HgbA1c 9.9 on 12-22-17 most recent lab pending.  Aspirin 81mg  anf Lovenox 40mg  injection   for VTE prophylaxis Diet Order           Diet Heart Room service appropriate? Yes; Fluid consistency: Thin  Diet effective now           aspirin 81 mg daily prior to admission, now on aspirin 81 mg daily. And Lovenox  Therapy recommendations: pending  Disposition:  pending  Hypertension  Stable HTN  Long-term BP goal normotensive  Hyperlipidemia  Home meds:  Pravastatin 80mg , not resumed in hospital; patient stated that she is allergic to statins except for Livalo  LDL 95, goal < 70  Add gemfibrozil if patient cannot continue on Livalo  Continue statin at discharge; if case manager can help patient afford Livalo  Diabetes type II  HgbA1c 9.9 on 12-22-17, goal < 7.0  UnControlled  Other Stroke Risk Factors  Advanced age  Former Cigarette smoker quit 10-15 years ago  Denies ETOH use, advised to drink no more than 1 drink a day  Obesity, Body mass index is 31.76 kg/m., recommend weight loss, diet and exercise as appropriate   Coronary artery disease  Other Active Problems  Peripheral vascular disease  Hospital day # 0  Laurey Morale, MSN, NP-C [facility]  To contact Stroke Continuity provider, please refer to [email or web address] After hours, contact General Neurology

## 2018-01-17 NOTE — Care Management Note (Signed)
Case Management Note  CM consulted for medication cost assistance.  Per [facility] Medicare, pt's co-pay is $45 for a 30 day supply of Livalo 4 mg daily.  No pre-auth required.  Pt will have to pay the $45 co-pay for medication.  Updated Dr. Alfredia Ferguson.  Inpt CM will continue to follow for needs.

## 2018-01-17 NOTE — ED Notes (Signed)
ED TO INPATIENT HANDOFF REPORT  Name/Age/Gender Brenda Moreno 72 y.o. female  Code Status    Code Status Orders  (From admission, onward)        Start     Ordered   01/17/18 0129  Full code  Continuous     01/17/18 0128    Code Status History    Date Active Date Inactive Code Status Order ID Comments User Context   12/22/2017 0040 12/23/2017 1633 Full Code [id number]  Norval Morton, MD ED      Home/SNF/Other Home  Chief Complaint [place]; No Balance  Level of Care/Admitting Diagnosis ED Disposition    ED Disposition Condition Comment   Admit  Hospital Area: [facility] [[id number]]  Level of Care: Telemetry [5]  Diagnosis: CVA (cerebral vascular accident) [facility]) [[id number]]  Admitting Physician: Bethena Roys [id number]  Attending Physician: Bethena Roys Nessa.Cuff  PT Class (Do Not Modify): Observation [104]  PT Acc Code (Do Not Modify): Observation [10022]       Medical History Past Medical History:  Diagnosis Date  . Arthritis   . CAD (coronary artery disease)    S/P cabg  . Carotid artery occlusion   . Cataract   . CHF (congestive heart failure) ([place])   . DDD (degenerative disc disease), lumbar   . Diabetes mellitus   . Hyperlipidemia   . Hypertension   . Joint pain   . Leg pain   . Myocardial infarction ([place]) 1998  . Peripheral vascular disease ([place])   . PVD (peripheral vascular disease) ([facility])     Allergies Allergies  Allergen Reactions  . Levaquin [Levofloxacin In D5w] Other (See Comments)    Pain all over and in joints  . Statins Other (See Comments)    Severe myalgias to lipitor, crestor, pravastatin and weakness and cramping  in bilateral leg.  [name] [Linagliptin] Itching    IV Location/Drains/Wounds Patient Lines/Drains/Airways Status   Active Line/Drains/Airways    Name:   Placement date:   Placement time:   Site:   Days:   Peripheral IV 01/16/18 Left Forearm   01/16/18    1320    Forearm   1    Peripheral IV 01/16/18 Left Antecubital   01/16/18    1413    Antecubital   1          Labs/Imaging Results for orders placed or performed during the hospital encounter of 01/16/18 (from the past 48 hour(s))  CBG monitoring, ED     Status: Abnormal   Collection Time: 01/16/18  1:00 PM  Result Value Ref Range   Glucose-Capillary 183 (H) 65 - 99 mg/dL  Protime-INR     Status: None   Collection Time: 01/16/18  1:19 PM  Result Value Ref Range   Prothrombin Time 11.8 11.4 - 15.2 seconds   INR 0.88     Comment: Performed at [facility], [place] [address] [place], [place] [address]  APTT     Status: None   Collection Time: 01/16/18  1:19 PM  Result Value Ref Range   aPTT 34 24 - 36 seconds    Comment: Performed at [facility], [place] [address] [place], [place] [address]  CBC     Status: Abnormal   Collection Time: 01/16/18  1:19 PM  Result Value Ref Range   WBC 6.1 4.0 - 10.5 K/uL   RBC 4.98 3.87 - 5.11 MIL/uL   Hemoglobin 13.8 12.0 - 15.0  g/dL   HCT 40.9 36.0 - 46.0 %   MCV 82.1 78.0 - 100.0 fL   MCH 27.7 26.0 - 34.0 pg   MCHC 33.7 30.0 - 36.0 g/dL   RDW 17.2 (H) 11.5 - 15.5 %   Platelets 198 150 - 400 K/uL    Comment: Performed at [facility], [place] [address] [place], [place] [address]  Differential     Status: None   Collection Time: 01/16/18  1:19 PM  Result Value Ref Range   Neutrophils Relative % 55 %   Neutro Abs 3.3 1.7 - 7.7 K/uL   Lymphocytes Relative 35 %   Lymphs Abs 2.1 0.7 - 4.0 K/uL   Monocytes Relative 8 %   Monocytes Absolute 0.5 0.1 - 1.0 K/uL   Eosinophils Relative 2 %   Eosinophils Absolute 0.1 0.0 - 0.7 K/uL   Basophils Relative 0 %   Basophils Absolute 0.0 0.0 - 0.1 K/uL    Comment: Performed at [facility], [place] [address], [place], [place] [address]  Comprehensive metabolic panel     Status: Abnormal   Collection Time: 01/16/18  1:19 PM  Result Value Ref Range   Sodium 142  135 - 145 mmol/L   Potassium 4.2 3.5 - 5.1 mmol/L   Chloride 108 101 - 111 mmol/L   CO2 24 22 - 32 mmol/L   Glucose, Bld 193 (H) 65 - 99 mg/dL   BUN 25 (H) 6 - 20 mg/dL   Creatinine, Ser 1.20 (H) 0.44 - 1.00 mg/dL   Calcium 9.4 8.9 - 10.3 mg/dL   Total Protein 7.7 6.5 - 8.1 g/dL   Albumin 4.0 3.5 - 5.0 g/dL   AST 18 15 - 41 U/L   ALT 17 14 - 54 U/L   Alkaline Phosphatase 82 38 - 126 U/L   Total Bilirubin 0.4 0.3 - 1.2 mg/dL   GFR calc non Af Amer 45 (L) >60 mL/min   GFR calc Af Amer 52 (L) >60 mL/min    Comment: (NOTE) The eGFR has been calculated using the CKD EPI equation. This calculation has not been validated in all clinical situations. eGFR's persistently <60 mL/min signify possible Chronic Kidney Disease.    Anion gap 10 5 - 15    Comment: Performed at [facility], [place] [address] [place], [place] [address]  I-stat troponin, ED     Status: None   Collection Time: 01/16/18  1:25 PM  Result Value Ref Range   Troponin i, poc 0.01 0.00 - 0.08 ng/mL   Comment 3            Comment: Due to the release kinetics of cTnI, a negative result within the first hours of the onset of symptoms does not rule out myocardial infarction with certainty. If myocardial infarction is still suspected, repeat the test at appropriate intervals.   I-Stat Chem 8, ED     Status: Abnormal   Collection Time: 01/16/18  1:27 PM  Result Value Ref Range   Sodium 143 135 - 145 mmol/L   Potassium 4.2 3.5 - 5.1 mmol/L   Chloride 107 101 - 111 mmol/L   BUN 24 (H) 6 - 20 mg/dL   Creatinine, Ser 1.10 (H) 0.44 - 1.00 mg/dL   Glucose, Bld 183 (H) 65 - 99 mg/dL   Calcium, Ion 1.23 1.15 - 1.40 mmol/L   TCO2 25 22 - 32 mmol/L   Hemoglobin 14.6 12.0 - 15.0 g/dL   HCT  43.0 36.0 - 46.0 %  CBG monitoring, ED     Status: Abnormal   Collection Time: 01/16/18  1:34 PM  Result Value Ref Range   Glucose-Capillary 190 (H) 65 - 99 mg/dL  CBG monitoring, ED     Status: Abnormal   Collection  Time: 01/17/18  2:02 AM  Result Value Ref Range   Glucose-Capillary 186 (H) 65 - 99 mg/dL  CBG monitoring, ED     Status: Abnormal   Collection Time: 01/17/18  9:04 AM  Result Value Ref Range   Glucose-Capillary 275 (H) 65 - 99 mg/dL  CBC with Differential/Platelet     Status: Abnormal   Collection Time: 01/17/18  9:09 AM  Result Value Ref Range   WBC 6.2 4.0 - 10.5 K/uL   RBC 4.40 3.87 - 5.11 MIL/uL   Hemoglobin 12.0 12.0 - 15.0 g/dL   HCT 35.4 (L) 36.0 - 46.0 %   MCV 80.5 78.0 - 100.0 fL   MCH 27.3 26.0 - 34.0 pg   MCHC 33.9 30.0 - 36.0 g/dL   RDW 17.1 (H) 11.5 - 15.5 %   Platelets 191 150 - 400 K/uL   Neutrophils Relative % 52 %   Neutro Abs 3.2 1.7 - 7.7 K/uL   Lymphocytes Relative 37 %   Lymphs Abs 2.3 0.7 - 4.0 K/uL   Monocytes Relative 9 %   Monocytes Absolute 0.5 0.1 - 1.0 K/uL   Eosinophils Relative 3 %   Eosinophils Absolute 0.2 0.0 - 0.7 K/uL   Basophils Relative 1 %   Basophils Absolute 0.0 0.0 - 0.1 K/uL    Comment: Performed at [facility], [place] [address] [place], [place] [address]  Comprehensive metabolic panel     Status: Abnormal   Collection Time: 01/17/18  9:09 AM  Result Value Ref Range   Sodium 140 135 - 145 mmol/L   Potassium 4.2 3.5 - 5.1 mmol/L   Chloride 107 101 - 111 mmol/L   CO2 25 22 - 32 mmol/L   Glucose, Bld 306 (H) 65 - 99 mg/dL   BUN 29 (H) 6 - 20 mg/dL   Creatinine, Ser 1.31 (H) 0.44 - 1.00 mg/dL   Calcium 8.8 (L) 8.9 - 10.3 mg/dL   Total Protein 6.1 (L) 6.5 - 8.1 g/dL   Albumin 3.2 (L) 3.5 - 5.0 g/dL   AST 16 15 - 41 U/L   ALT 15 14 - 54 U/L   Alkaline Phosphatase 75 38 - 126 U/L   Total Bilirubin 0.4 0.3 - 1.2 mg/dL   GFR calc non Af Amer 41 (L) >60 mL/min   GFR calc Af Amer 47 (L) >60 mL/min    Comment: (NOTE) The eGFR has been calculated using the CKD EPI equation. This calculation has not been validated in all clinical situations. eGFR's persistently <60 mL/min signify possible Chronic Kidney Disease.     Anion gap 8 5 - 15    Comment: Performed at [facility], [place] [address], [place], [place] [address]  Magnesium     Status: None   Collection Time: 01/17/18  9:09 AM  Result Value Ref Range   Magnesium 1.9 1.7 - 2.4 mg/dL    Comment: Performed at [facility], [place] [address], [place], [place] [address]  Phosphorus     Status: None   Collection Time: 01/17/18  9:09 AM  Result Value Ref Range   Phosphorus 3.7 2.5 - 4.6 mg/dL    Comment: Performed  at [facility], [place] [address], [place], [place] [address]  Lipid panel     Status: Abnormal   Collection Time: 01/17/18  9:09 AM  Result Value Ref Range   Cholesterol 157 0 - 200 mg/dL   Triglycerides 131 <150 mg/dL   HDL 36 (L) >40 mg/dL   Total CHOL/HDL Ratio 4.4 RATIO   VLDL 26 0 - 40 mg/dL   LDL Cholesterol 95 0 - 99 mg/dL    Comment:        Total Cholesterol/HDL:CHD Risk Coronary Heart Disease Risk Table                     Men   Women  1/2 Average Risk   3.4   3.3  Average Risk       5.0   4.4  2 X Average Risk   9.6   7.1  3 X Average Risk  23.4   11.0        Use the calculated Patient Ratio above and the CHD Risk Table to determine the patient's CHD Risk.        ATP III CLASSIFICATION (LDL):  <100     mg/dL   Optimal  100-129  mg/dL   Near or Above                    Optimal  130-159  mg/dL   Borderline  160-189  mg/dL   High  >190     mg/dL   Very High Performed at [facility] [address], [place], [place] [address]   Hemoglobin A1c     Status: Abnormal   Collection Time: 01/17/18  9:09 AM  Result Value Ref Range   Hgb A1c MFr Bld 8.9 (H) 4.8 - 5.6 %    Comment: (NOTE) Pre diabetes:          5.7%-6.4% Diabetes:              >6.4% Glycemic control for   <7.0% adults with diabetes    Mean Plasma Glucose 208.73 mg/dL    Comment: Performed at [facility] [address], [place], [place] [address]  CBG monitoring, ED     Status:  None   Collection Time: 01/17/18  1:01 PM  Result Value Ref Range   Glucose-Capillary 92 65 - 99 mg/dL  CBG monitoring, ED     Status: Abnormal   Collection Time: 01/17/18  6:22 PM  Result Value Ref Range   Glucose-Capillary 110 (H) 65 - 99 mg/dL   Ct Angio Head W Or Wo Contrast  Result Date: 01/16/2018 CLINICAL DATA:  72 y/o F; dizziness with trouble balancing. History of cardiac catheterization 12/22/2017. EXAM: CT ANGIOGRAPHY HEAD AND NECK TECHNIQUE: Multidetector CT imaging of the head and neck was performed using the standard protocol during bolus administration of intravenous contrast. Multiplanar CT image reconstructions and MIPs were obtained to evaluate the vascular anatomy. Carotid stenosis measurements (when applicable) are obtained utilizing NASCET criteria, using the distal internal carotid diameter as the denominator. CONTRAST:  160 cc Isovue 370 COMPARISON:  03/23/2017 CT head.  07/06/2010 MRI head. FINDINGS: CT HEAD FINDINGS Brain: No evidence of acute infarction, hemorrhage, hydrocephalus, extra-axial collection or mass lesion/mass effect. Stable small chronic lacunar infarct in the left thalamus. Stable chronic microvascular ischemic changes of white matter and parenchymal volume loss the brain. Vascular: As below. Skull: Normal. Negative for fracture or focal lesion. Sinuses: Imaged portions  are clear. Orbits: No acute finding. Review of the MIP images confirms the above findings CTA NECK FINDINGS Aortic arch: Standard branching. Imaged portion shows no evidence of aneurysm or dissection. Post CABG and median sternotomy. Mild calcific atherosclerosis. Calcific atherosclerosis of left subclavian artery origin, partially obscured by streak artifact, probable 50% moderate underlying stenosis. Right carotid system: No evidence of dissection, stenosis (50% or greater) or occlusion. Non stenotic calcified plaque of carotid bifurcation. Left carotid system: No evidence of dissection, stenosis  (50% or greater) or occlusion. Vertebral arteries: Codominant. No evidence of dissection, stenosis (50% or greater) or occlusion. Calcific atherosclerosis of vertebral artery origins with mild less than 50% stenosis. Skeleton: C5-6 anterior cervical discectomy. No acute osseous abnormality. Other neck: Negative. Upper chest: Negative. Review of the MIP images confirms the above findings CTA HEAD FINDINGS Anterior circulation: Calcific atherosclerosis of bilateral carotid siphons with mild right paraclinoid and mild-to-moderate distal left cavernous stenosis. No high-grade stenosis, proximal occlusion, aneurysm, or vascular malformation. Posterior circulation: Calcific atherosclerosis of bilateral V4 segments with mild stenosis. No additional segment of significant stenosis, proximal occlusion, aneurysm, or vascular malformation. Venous sinuses: As permitted by contrast timing, patent. Anatomic variants: No posterior communicating artery identified, likely hypoplastic or absent. Patent anterior communicating artery. Delayed phase: No abnormal intracranial enhancement. Review of the MIP images confirms the above findings IMPRESSION: CT head: 1. No acute intracranial abnormality identified. 2. Stable chronic microvascular ischemic changes and parenchymal volume loss of the brain. CTA neck: 1. Patent carotid and vertebral arteries. No significant stenosis by NASCET criteria, dissection, or occlusion. 2. Calcified plaque bilateral vertebral artery origins with mild less than 50% stenosis. 3. Calcified plaque of left subclavian artery origin, partially obscured by streak artifact, approximately 50% mild-to-moderate stenosis. CTA head: 1. Patent anterior and posterior intracranial circulation. No high-grade stenosis, aneurysm, large vessel occlusion, or vascular malformation. 2. Multiple segments of mild stenosis in the anterior and posterior circulations as well as mild-to-moderate distal left cavernous ICA stenosis.  Electronically Signed   By: Kristine Garbe M.D.   On: 01/16/2018 17:08   Ct Angio Neck W And/or Wo Contrast  Result Date: 01/16/2018 CLINICAL DATA:  72 y/o F; dizziness with trouble balancing. History of cardiac catheterization 12/22/2017. EXAM: CT ANGIOGRAPHY HEAD AND NECK TECHNIQUE: Multidetector CT imaging of the head and neck was performed using the standard protocol during bolus administration of intravenous contrast. Multiplanar CT image reconstructions and MIPs were obtained to evaluate the vascular anatomy. Carotid stenosis measurements (when applicable) are obtained utilizing NASCET criteria, using the distal internal carotid diameter as the denominator. CONTRAST:  160 cc Isovue 370 COMPARISON:  03/23/2017 CT head.  07/06/2010 MRI head. FINDINGS: CT HEAD FINDINGS Brain: No evidence of acute infarction, hemorrhage, hydrocephalus, extra-axial collection or mass lesion/mass effect. Stable small chronic lacunar infarct in the left thalamus. Stable chronic microvascular ischemic changes of white matter and parenchymal volume loss the brain. Vascular: As below. Skull: Normal. Negative for fracture or focal lesion. Sinuses: Imaged portions are clear. Orbits: No acute finding. Review of the MIP images confirms the above findings CTA NECK FINDINGS Aortic arch: Standard branching. Imaged portion shows no evidence of aneurysm or dissection. Post CABG and median sternotomy. Mild calcific atherosclerosis. Calcific atherosclerosis of left subclavian artery origin, partially obscured by streak artifact, probable 50% moderate underlying stenosis. Right carotid system: No evidence of dissection, stenosis (50% or greater) or occlusion. Non stenotic calcified plaque of carotid bifurcation. Left carotid system: No evidence of dissection, stenosis (50% or greater) or occlusion.  Vertebral arteries: Codominant. No evidence of dissection, stenosis (50% or greater) or occlusion. Calcific atherosclerosis of vertebral  artery origins with mild less than 50% stenosis. Skeleton: C5-6 anterior cervical discectomy. No acute osseous abnormality. Other neck: Negative. Upper chest: Negative. Review of the MIP images confirms the above findings CTA HEAD FINDINGS Anterior circulation: Calcific atherosclerosis of bilateral carotid siphons with mild right paraclinoid and mild-to-moderate distal left cavernous stenosis. No high-grade stenosis, proximal occlusion, aneurysm, or vascular malformation. Posterior circulation: Calcific atherosclerosis of bilateral V4 segments with mild stenosis. No additional segment of significant stenosis, proximal occlusion, aneurysm, or vascular malformation. Venous sinuses: As permitted by contrast timing, patent. Anatomic variants: No posterior communicating artery identified, likely hypoplastic or absent. Patent anterior communicating artery. Delayed phase: No abnormal intracranial enhancement. Review of the MIP images confirms the above findings IMPRESSION: CT head: 1. No acute intracranial abnormality identified. 2. Stable chronic microvascular ischemic changes and parenchymal volume loss of the brain. CTA neck: 1. Patent carotid and vertebral arteries. No significant stenosis by NASCET criteria, dissection, or occlusion. 2. Calcified plaque bilateral vertebral artery origins with mild less than 50% stenosis. 3. Calcified plaque of left subclavian artery origin, partially obscured by streak artifact, approximately 50% mild-to-moderate stenosis. CTA head: 1. Patent anterior and posterior intracranial circulation. No high-grade stenosis, aneurysm, large vessel occlusion, or vascular malformation. 2. Multiple segments of mild stenosis in the anterior and posterior circulations as well as mild-to-moderate distal left cavernous ICA stenosis. Electronically Signed   By: Kristine Garbe M.D.   On: 01/16/2018 17:08   Mr Brain Wo Contrast  Result Date: 01/16/2018 CLINICAL DATA:  72 y/o  F; ataxia,  stroke suspected. EXAM: MRI HEAD WITHOUT CONTRAST TECHNIQUE: Multiplanar, multiecho pulse sequences of the brain and surrounding structures were obtained without intravenous contrast. COMPARISON:  01/16/2018 CT angiogram of the head and neck. FINDINGS: Brain: Subcentimeter focus of reduced diffusion extending from the right paramedian midbrain to the right superior cerebellar peduncle (series 3, image 18-20). No associated hemorrhage or mass effect. Small chronic infarctions are present within the left cerebellar hemisphere, upper pons, right genu of internal capsule, and left thalamus. Patchy nonspecific foci of T2 FLAIR hyperintense signal abnormality in subcortical and periventricular white matter are compatible with moderate chronic microvascular ischemic changes for age. Moderate brain parenchymal volume loss. Vascular: Normal flow voids. Skull and upper cervical spine: Normal marrow signal. Sinuses/Orbits: Negative. Other: None. IMPRESSION: 1. Subcentimeter focus of acute/early subacute infarction extending from the right paramedian midbrain the right superior cerebellar peduncle. No associated hemorrhage or mass effect. 2. Small chronic infarctions are present within the left cerebellar hemisphere, upper pons, right genu of internal capsule, thalamus. 3. Moderate for age chronic microvascular ischemic changes and parenchymal volume loss of the brain. These results were called by telephone at the time of interpretation on 01/16/2018 at 7:43 pm to Dr. Sherwood Gambler , who verbally acknowledged these results. Electronically Signed   By: Kristine Garbe M.D.   On: 01/16/2018 19:45    Pending Labs Unresulted Labs (From admission, onward)   Start     Ordered   01/18/18 0500  CBC with Differential/Platelet  Tomorrow morning,   R     01/17/18 1209   01/18/18 0500  Comprehensive metabolic panel  Tomorrow morning,   R     01/17/18 1209   01/18/18 0500  Magnesium  Tomorrow morning,   R     01/17/18  1209   01/18/18 0500  Phosphorus  Tomorrow morning,   R  01/17/18 1209      Vitals/Pain Today's Vitals   01/17/18 1900 01/17/18 1901 01/17/18 1930 01/17/18 1934  BP: (!) 158/74 (!) 158/74 (!) 164/59 (!) 164/59  Pulse:  (!) 54 (!) 56 65  Resp: 17 18 12 16   Temp:      TempSrc:      SpO2:  100% 100% 100%  Weight:      Height:      PainSc:        Isolation Precautions No active isolations  Medications Medications  aspirin chewable tablet 81 mg (81 mg Oral Given 01/17/18 0918)  fluticasone (FLONASE) 50 MCG/ACT nasal spray 2 spray (has no administration in time range)  insulin aspart protamine- aspart (NOVOLOG MIX 70/30) injection 8 Units (8 Units Subcutaneous Given 01/17/18 1829)  metFORMIN (GLUCOPHAGE) tablet 1,000 mg (1,000 mg Oral Given 01/17/18 1829)  pioglitazone (ACTOS) tablet 30 mg (30 mg Oral Given 01/17/18 0918)  potassium chloride (K-DUR,KLOR-CON) CR tablet 10 mEq (10 mEq Oral Given 01/17/18 0918)  acetaminophen (TYLENOL) tablet 650 mg (has no administration in time range)    Or  acetaminophen (TYLENOL) solution 650 mg (has no administration in time range)    Or  acetaminophen (TYLENOL) suppository 650 mg (has no administration in time range)  senna-docusate (Senokot-S) tablet 1 tablet (has no administration in time range)  enoxaparin (LOVENOX) injection 40 mg (40 mg Subcutaneous Given 01/17/18 0915)  furosemide (LASIX) tablet 20 mg (20 mg Oral Given 01/17/18 0918)  insulin aspart (novoLOG) injection 0-9 Units (0 Units Subcutaneous Not Given 01/17/18 1823)  ezetimibe (ZETIA) tablet 10 mg (10 mg Oral Given 01/17/18 1828)  meclizine (ANTIVERT) tablet 25 mg (25 mg Oral Given 01/16/18 1402)  iopamidol (ISOVUE-370) 76 % injection (  Contrast Given 01/16/18 1530)  iopamidol (ISOVUE-370) 76 % injection 100 mL (80 mLs Intravenous Contrast Given 01/16/18 1513)  iopamidol (ISOVUE-370) 76 % injection 100 mL ( Intravenous Contrast Given 01/16/18 1657)  aspirin chewable tablet 324 mg (324  mg Oral Given 01/16/18 2025)   stroke: mapping our early stages of recovery book ( Does not apply Given 01/17/18 1248)    Mobility walks

## 2018-01-17 NOTE — Telephone Encounter (Signed)
Received fax requesting alternative to Novolin 70/30. Medication is not covered by formulary.   MD made aware and NO given to change to [place] 70/30. Prescription sent to pharmacy.

## 2018-01-17 NOTE — ED Notes (Signed)
Took over report for [employer], Therapist, sports. [facility] is here to transport patient to [facility].

## 2018-01-17 NOTE — Progress Notes (Signed)
Called from [facility] to receive report but put on hold for 10 min.

## 2018-01-17 NOTE — ED Notes (Signed)
Bed: SP32 Expected date:  Expected time:  Means of arrival:  Comments: resus a

## 2018-01-17 NOTE — ED Notes (Signed)
[facility] contacted for transport to [facility] 

## 2018-01-17 NOTE — ED Notes (Signed)
Pt given breakfast tray

## 2018-01-17 NOTE — Consult Note (Signed)
Neurology Consultation  Reason for Consult: Cerebellar stroke Referring Physician: Dr. Regenia Skeeter Patient seen for initial consultation at [facility] emergency room.  Will need transfer to [facility] for stroke team follow-up.  CC: Gait instability  History is obtained from: Patient, chart  HPI: Brenda Moreno is a 72 y.o. female past medical history of coronary artery disease, diabetes, hypertension, hyperlipidemia, peripheral vascular disease, was in his usual state of health when she woke up at 5 AM on 01/16/2018 and found herself wobbly. She says that she has never had a stroke in the past.  This morning when she woke up, she felt that she was sorting to the right.  She had to hold onto the objects to support herself.  She went to bed the night prior, unclear time. Denies any double vision or headaches.  Denies tingling numbness.  Denies any focal weakness.  Denies any headaches.  Denies chest pain or nausea.  Denies vomiting.  Denies easy bruising bleeding. Has a family history of heart attacks at a young age.  Has had a coronary artery bypass graft done 21 years ago. Is a former smoker.  Denies alcohol or illicit drug use.\ MRI of the brain was done that revealed a subcentimeter focus of acute subacute infarction extending from the right paramedian midbrain into the right superior cerebellar peduncle with no mass-effect or hemorrhage.  Small chronic infarctions in the left cerebellum, pons, right change of internal capsule and thalamus-likely small vessel etiology.  Reports allergy to all statins-myalgias.  Has been recently started with a statin and is experiencing lower extremity pain.  LKW: 10 PM on 01/15/2018 tpa given?: no, outside window Premorbid modified Rankin scale (mRS): 0  ROS: ROS was performed and is negative except as noted in the HPI.   Past Medical History:  Diagnosis Date  . Arthritis   . CAD (coronary artery disease)    S/P cabg  . Carotid artery occlusion    . Cataract   . CHF (congestive heart failure) ([place])   . DDD (degenerative disc disease), lumbar   . Diabetes mellitus   . Hyperlipidemia   . Hypertension   . Joint pain   . Leg pain   . Myocardial infarction ([place]) 1998  . Peripheral vascular disease ([place])   . PVD (peripheral vascular disease) ([facility])     Family History  Problem Relation Age of Onset  . Heart disease Mother   . Diabetes Mother   . Hyperlipidemia Mother   . Hypertension Mother   . Heart disease Father        Heart Disease before age 109  . Diabetes Father   . Hyperlipidemia Father   . Hypertension Father   . Heart disease Sister        heart attack  . Diabetes Sister        Amputation  . Hypertension Sister   . Other Sister        history of amputation  . Heart attack Sister   . Diabetes Brother   . Hypertension Brother   . Heart disease Brother 61       Before age 49  . Hyperlipidemia Brother   . Diabetes Brother        scirrosis of liver  . Coronary artery disease Other   . Colon cancer Neg Hx   . Stomach cancer Neg Hx   . Esophageal cancer Neg Hx     Social History:   reports that she quit smoking about  6 years ago. Her smoking use included cigarettes. She smoked 0.25 packs per day. She has never used smokeless tobacco. She reports that she does not drink alcohol or use drugs.  Medications  Current Facility-Administered Medications:  .   stroke: mapping our early stages of recovery book, , Does not apply, Once, Emokpae, Ejiroghene E, MD .  acetaminophen (TYLENOL) tablet 650 mg, 650 mg, Oral, Q4H PRN **OR** acetaminophen (TYLENOL) solution 650 mg, 650 mg, Per Tube, Q4H PRN **OR** acetaminophen (TYLENOL) suppository 650 mg, 650 mg, Rectal, Q4H PRN, Emokpae, Ejiroghene E, MD .  aspirin chewable tablet 81 mg, 81 mg, Oral, Daily, Emokpae, Ejiroghene E, MD .  enoxaparin (LOVENOX) injection 40 mg, 40 mg, Subcutaneous, Q24H, Emokpae, Ejiroghene E, MD .  fluticasone (FLONASE) 50 MCG/ACT nasal spray 2  spray, 2 spray, Each Nare, Daily PRN, Emokpae, Ejiroghene E, MD .  furosemide (LASIX) tablet 20 mg, 20 mg, Oral, Daily, Emokpae, Ejiroghene E, MD .  insulin aspart (novoLOG) injection 0-9 Units, 0-9 Units, Subcutaneous, TID WC, Emokpae, Ejiroghene E, MD .  insulin aspart protamine- aspart (NOVOLOG MIX 70/30) injection 8 Units, 8 Units, Subcutaneous, BID WC, Emokpae, Ejiroghene E, MD .  metFORMIN (GLUCOPHAGE) tablet 1,000 mg, 1,000 mg, Oral, BID WC, Emokpae, Ejiroghene E, MD .  pioglitazone (ACTOS) tablet 30 mg, 30 mg, Oral, UD, Emokpae, Ejiroghene E, MD .  potassium chloride (K-DUR,KLOR-CON) CR tablet 10 mEq, 10 mEq, Oral, Daily, Emokpae, Ejiroghene E, MD .  senna-docusate (Senokot-S) tablet 1 tablet, 1 tablet, Oral, QHS PRN, Emokpae, Ejiroghene E, MD  Current Outpatient Medications:  .  amLODipine (NORVASC) 5 MG tablet, TAKE 1 TABLET BY MOUTH  DAILY, Disp: 90 tablet, Rfl: 1 .  aspirin EC 81 MG tablet, Take 81 mg by mouth daily., Disp: , Rfl:  .  CINNAMON PO, Take 1,000 mg by mouth 2 (two) times daily.  , Disp: , Rfl:  .  Coenzyme Q10 (CO Q 10 PO), Take 100 mg by mouth daily.  , Disp: , Rfl:  .  fluticasone (FLONASE) 50 MCG/ACT nasal spray, Place 2 sprays into both nostrils daily as needed for allergies., Disp: , Rfl:  .  furosemide (LASIX) 40 MG tablet, Take 20 mg by mouth daily. Patient taking 0.5 tablets po qd., Disp: , Rfl:  .  insulin NPH-regular Human (NOVOLIN 70/30) (70-30) 100 UNIT/ML injection, 15 units with breakfast and 7 units with supper, Disp: 10 mL, Rfl: 11 .  metFORMIN (GLUCOPHAGE) 1000 MG tablet, TAKE 1 TABLET BY MOUTH  TWICE A DAY WITH MEALS, Disp: 180 tablet, Rfl: 0 .  metoprolol succinate (TOPROL-XL) 100 MG 24 hr tablet, TAKE 1 TABLET BY MOUTH  DAILY WITH OR IMMEDIATLEY  FOLLOWING A MEAL, Disp: 90 tablet, Rfl: 4 .  pioglitazone (ACTOS) 30 MG tablet, Take 30 mg by mouth as directed., Disp: , Rfl:  .  potassium chloride (K-DUR) 10 MEQ tablet, Take 1 tablet (10 mEq total) by  mouth daily., Disp: 30 tablet, Rfl: 11 .  pravastatin ([name]) 80 MG tablet, Take 1 tablet (80 mg total) by mouth every evening., Disp: 90 tablet, Rfl: 3 .  quinapril (ACCUPRIL) 20 MG tablet, Take 20 mg by mouth 2 (two) times daily., Disp: , Rfl:  .  nitroGLYCERIN (NITROSTAT) 0.4 MG SL tablet, Place 1 tablet (0.4 mg total) under the tongue every 5 (five) minutes as needed for chest pain., Disp: 90 tablet, Rfl: 2 .  traMADol (ULTRAM) 50 MG tablet, Take 1 tablet (50 mg total) by mouth every 8 (eight)  hours as needed. (Patient not taking: Reported on 01/16/2018), Disp: 180 tablet, Rfl: 0  Exam: Current vital signs: BP 136/76   Pulse (!) 50   Temp 97.8 F (36.6 C) (Oral)   Resp 14   Ht 5\' 4"  (1.626 m)   Wt 83.9 kg (185 lb)   SpO2 98%   BMI 31.76 kg/m  Vital signs in last 24 hours: Temp:  [97.8 F (36.6 C)] 97.8 F (36.6 C) (05/21 1301) Pulse Rate:  [45-64] 50 (05/22 0145) Resp:  [11-24] 14 (05/22 0145) BP: (128-194)/(44-158) 136/76 (05/22 0145) SpO2:  [97 %-100 %] 98 % (05/22 0145) Weight:  [83.9 kg (185 lb)] 83.9 kg (185 lb) (05/21 1851)  GENERAL: Awake, alert in NAD HEENT: - Normocephalic and atraumatic, dry mm, no LN++, no Thyromegally LUNGS - Clear to auscultation bilaterally with no wheezes CV - S1S2 RRR, no m/r/g, equal pulses bilaterally. ABDOMEN - Soft, nontender, nondistended with normoactive BS Ext: warm, well perfused, intact peripheral pulses, no edema NEURO:  Mental Status: AA&Ox3  Language: speech is non-dysarthric.  Naming, repetition, fluency, and comprehension intact. Cranial Nerves: PERRL. EOMI, visual fields full, no facial asymmetry, facial sensation intact, hearing intact, tongue/uvula/soft palate midline, normalsternocleidomastoid and trapezius muscle strength. No evidence of tongue atrophy or fibrillations Motor: 5/5 in all 4 extremities with no vertical drift. Tone: is normal and bulk is normal Sensation- Intact to light touch bilaterally, no  extinction Coordination: FTN intact bilaterally, no ataxia in BLE. Gait- deferred but per ER report was ataxic  NIHSS--0   Labs I have reviewed labs in epic and the results pertinent to this consultation are:  CBC    Component Value Date/Time   WBC 6.1 01/16/2018 1319   RBC 4.98 01/16/2018 1319   HGB 14.6 01/16/2018 1327   HCT 43.0 01/16/2018 1327   PLT 198 01/16/2018 1319   MCV 82.1 01/16/2018 1319   MCH 27.7 01/16/2018 1319   MCHC 33.7 01/16/2018 1319   RDW 17.2 (H) 01/16/2018 1319   LYMPHSABS 2.1 01/16/2018 1319   MONOABS 0.5 01/16/2018 1319   EOSABS 0.1 01/16/2018 1319   BASOSABS 0.0 01/16/2018 1319    CMP     Component Value Date/Time   NA 143 01/16/2018 1327   K 4.2 01/16/2018 1327   CL 107 01/16/2018 1327   CO2 24 01/16/2018 1319   GLUCOSE 183 (H) 01/16/2018 1327   BUN 24 (H) 01/16/2018 1327   CREATININE 1.10 (H) 01/16/2018 1327   CREATININE 1.24 (H) 06/28/2016 1446   CALCIUM 9.4 01/16/2018 1319   PROT 7.7 01/16/2018 1319   ALBUMIN 4.0 01/16/2018 1319   AST 18 01/16/2018 1319   ALT 17 01/16/2018 1319   ALKPHOS 82 01/16/2018 1319   BILITOT 0.4 01/16/2018 1319   GFRNONAA 45 (L) 01/16/2018 1319   GFRNONAA 38 (L) 01/18/2016 0953   GFRAA 52 (L) 01/16/2018 1319   GFRAA 44 (L) 01/18/2016 0953    Imaging I have reviewed the images obtained:  CT angiogram of the head and neck shows patent anterior and posterior circulation with multiple segments of mild stenosis in the anterior and posterior circulation as well as mild to moderate distal left cavernous ICA stenosis. MRI examination of the brain subcentimeter focus of restricted diffusion in the right paramedian midbrain and right superior cerebellar peduncle with no associated hemorrhage or mass-effect.  Assessment:  72 year old with multiple cerebrovascular risk factors presenting with unsteady gait. Neurological examination unremarkable except for gait ataxia. MRI suggestive of a subcentimeter area of  early subacute infarction in the right paramedian midbrain and right superior cerebellar peduncle-likely small vessel etiology from uncontrolled hypertension diabetes. Needs admission for stroke work-up.  Impression: Acute to subacute ischemic stroke in the right cerebellar peduncle and midbrain-likely small vessel etiology secondary to hypertension diabetes and history of smoking. Hypertension Diabetes Coronary artery disease Peripheral vascular disease Patient allergic to statin  Recommendations: On home aspirin 81.  Increase the dose to aspirin 325 p.o. daily now. Defer decisions for dual antiplatelet to stroke team team in the morning. Hold statin for now.  Further recommendations on cholesterol-lowering medication after lipid panel availability. Maintain on telemetry Frequent neurochecks 2D echocardiogram Physical therapy Occupational therapy Speech therapy Patient was n.p.o., passed bedside swallow and has a diet ordered. Importance of healthy lifestyle including dietary modifications, exercise and continuing to abstain from smoking discussed in detail with the patient and husband at bedside. Stroke team will continue to follow with you.  Patient needs to be transferred to [facility] and is currently pending the transfer. Please call us with questions.  -- Amie Portland, MD [facility] Pager: [phone] If 7pm to 7am, please call on call as listed on AMION.

## 2018-01-17 NOTE — ED Notes (Signed)
Pt provided meal tray

## 2018-01-18 ENCOUNTER — Observation Stay (HOSPITAL_BASED_OUTPATIENT_CLINIC_OR_DEPARTMENT_OTHER): Payer: Medicare Other

## 2018-01-18 DIAGNOSIS — I6300 Cerebral infarction due to thrombosis of unspecified precerebral artery: Secondary | ICD-10-CM | POA: Diagnosis not present

## 2018-01-18 DIAGNOSIS — E1159 Type 2 diabetes mellitus with other circulatory complications: Secondary | ICD-10-CM | POA: Diagnosis not present

## 2018-01-18 DIAGNOSIS — I5032 Chronic diastolic (congestive) heart failure: Secondary | ICD-10-CM | POA: Diagnosis not present

## 2018-01-18 DIAGNOSIS — I63331 Cerebral infarction due to thrombosis of right posterior cerebral artery: Secondary | ICD-10-CM | POA: Diagnosis not present

## 2018-01-18 DIAGNOSIS — I739 Peripheral vascular disease, unspecified: Secondary | ICD-10-CM

## 2018-01-18 DIAGNOSIS — I10 Essential (primary) hypertension: Secondary | ICD-10-CM | POA: Diagnosis not present

## 2018-01-18 DIAGNOSIS — E118 Type 2 diabetes mellitus with unspecified complications: Secondary | ICD-10-CM | POA: Diagnosis not present

## 2018-01-18 DIAGNOSIS — I361 Nonrheumatic tricuspid (valve) insufficiency: Secondary | ICD-10-CM | POA: Diagnosis not present

## 2018-01-18 DIAGNOSIS — I6302 Cerebral infarction due to thrombosis of basilar artery: Secondary | ICD-10-CM | POA: Diagnosis not present

## 2018-01-18 LAB — GLUCOSE, CAPILLARY
GLUCOSE-CAPILLARY: 328 mg/dL — AB (ref 65–99)
GLUCOSE-CAPILLARY: 119 mg/dL — AB (ref 65–99)
Glucose-Capillary: 131 mg/dL — ABNORMAL HIGH (ref 65–99)
Glucose-Capillary: 140 mg/dL — ABNORMAL HIGH (ref 65–99)

## 2018-01-18 LAB — ECHOCARDIOGRAM COMPLETE
Height: 64 in
Weight: 3171.1 oz

## 2018-01-18 MED ORDER — PRAVASTATIN SODIUM 40 MG PO TABS
80.0000 mg | ORAL_TABLET | Freq: Every evening | ORAL | Status: DC
  Administered 2018-01-18: 80 mg via ORAL
  Filled 2018-01-18: qty 2

## 2018-01-18 MED ORDER — CLOPIDOGREL BISULFATE 75 MG PO TABS
75.0000 mg | ORAL_TABLET | Freq: Every day | ORAL | Status: DC
  Administered 2018-01-18: 75 mg via ORAL
  Filled 2018-01-18: qty 1

## 2018-01-18 NOTE — Evaluation (Signed)
Physical Therapy Evaluation Patient Details Name: COLLIE KITTEL MRN: [id number] DOB: 04/06/1946 Today's Date: 01/18/2018   History of Present Illness  Pt is a 72 y.o. female with PMH of CAD, DM, HTN, PVD, admitted 01/16/18 with c/o "feeling wobbly." MIR revealed acute/subacute infarction extending from R paramedian midbrain into R superior cerebellar peduncle with no mass-effect or hemorrhage; small chronic infarctions in L cerebellum, pons, with R change of internal capsule and thalamus.    Clinical Impression  Pt presents with an overall decrease in functional mobility secondary to above. PTA, pt indep and lives with brothers.  Educ on current condition, stroke symptoms (including BE FAST), and fall risk reduction. Today, pt demonstrates higher level balance deficits while ambulating with no DME; DGI score of 15/24 indicates increased risk for falls. Stability and pt's confidence improved with use of RW. Pt would benefit from continued acute PT services to maximize functional mobility and independence prior to d/c with HHPT services.     Follow Up Recommendations Home health PT;Supervision - Intermittent    Equipment Recommendations  Rolling walker with 5" wheels    Recommendations for Other Services       Precautions / Restrictions Precautions Precautions: Fall Restrictions Weight Bearing Restrictions: No      Mobility  Bed Mobility Overal bed mobility: Independent                Transfers Overall transfer level: Needs assistance Equipment used: None;Rolling walker (2 wheeled) Transfers: Sit to/from Stand Sit to Stand: Supervision            Ambulation/Gait Ambulation/Gait assistance: Supervision;Min guard Ambulation Distance (Feet): 500 Feet Assistive device: Rolling walker (2 wheeled);None Gait Pattern/deviations: Step-through pattern;Decreased stride length;Drifts right/left Gait velocity: Decreased Gait velocity interpretation: 1.31 - 2.62 ft/sec,  indicative of limited community ambulator General Gait Details: Amb with no DME and min guard for balance due to occasional drift towards L-side, especially with turns; some instability, but no overt LOB performing higher level balance activities while ambulating. Amb additional distance with RW and supervision; pt reports much more confidence with RW  Stairs Stairs: Yes Stairs assistance: Supervision Stair Management: One rail Right;Alternating pattern;Forwards Number of Stairs: 10 General stair comments: Ascend/descended steps with single UE support on rail; supervision for safety  Wheelchair Mobility    Modified Rankin (Stroke Patients Only)       Balance Overall balance assessment: Needs assistance   Sitting balance-Leahy Scale: Normal       Standing balance-Leahy Scale: Good Standing balance comment: LOB to L with dynamic tasks               High Level Balance Comments: Increased time reaching for object on floor and reaching beyond BOS Standardized Balance Assessment Standardized Balance Assessment : Dynamic Gait Index   Dynamic Gait Index Level Surface: Mild Impairment Change in Gait Speed: Mild Impairment Gait with Horizontal Head Turns: Moderate Impairment Gait with Vertical Head Turns: Mild Impairment Gait and Pivot Turn: Mild Impairment Step Over Obstacle: Mild Impairment Step Around Obstacles: Mild Impairment Steps: Mild Impairment Total Score: 15       Pertinent Vitals/Pain Pain Assessment: No/denies pain    Home Living Family/patient expects to be discharged to:: Private residence Living Arrangements: Other relatives Available Help at Discharge: Family;Available PRN/intermittently Type of Home: House Home Access: Stairs to enter Entrance Stairs-Rails: Psychiatric nurse of Steps: 3 Home Layout: One level Home Equipment: None Additional Comments: Lives with two brothers, "we all care for each other"  Prior Function Level  of Independence: Independent         Comments: drives; likes to read; caregiver for visually impaired brother     Hand Dominance   Dominant Hand: Right    Extremity/Trunk Assessment   Upper Extremity Assessment Upper Extremity Assessment: Overall WFL for tasks assessed    Lower Extremity Assessment Lower Extremity Assessment: Overall WFL for tasks assessed(Pt feels as if LLE weaker, although MMT 5/5)    Cervical / Trunk Assessment Cervical / Trunk Assessment: Normal  Communication   Communication: No difficulties  Cognition Arousal/Alertness: Awake/alert Behavior During Therapy: WFL for tasks assessed/performed Overall Cognitive Status: Within Functional Limits for tasks assessed                                        General Comments      Exercises     Assessment/Plan    PT Assessment Patient needs continued PT services  PT Problem List Decreased balance;Decreased mobility;Decreased knowledge of use of DME       PT Treatment Interventions DME instruction;Gait training;Stair training;Functional mobility training;Therapeutic activities;Therapeutic exercise;Balance training;Neuromuscular re-education;Patient/family education    PT Goals (Current goals can be found in the Care Plan section)  Acute Rehab PT Goals Patient Stated Goal: to regain independence PT Goal Formulation: With patient Time For Goal Achievement: 02/01/18 Potential to Achieve Goals: Good    Frequency Min 4X/week   Barriers to discharge        Co-evaluation               AM-PAC PT "6 Clicks" Daily Activity  Outcome Measure Difficulty turning over in bed (including adjusting bedclothes, sheets and blankets)?: None Difficulty moving from lying on back to sitting on the side of the bed? : None Difficulty sitting down on and standing up from a chair with arms (e.g., wheelchair, bedside commode, etc,.)?: A Little Help needed moving to and from a bed to chair  (including a wheelchair)?: A Little Help needed walking in hospital room?: A Little Help needed climbing 3-5 steps with a railing? : A Little 6 Click Score: 20    End of Session Equipment Utilized During Treatment: Gait belt Activity Tolerance: Patient tolerated treatment well Patient left: in bed;with call bell/phone within reach Nurse Communication: Mobility status PT Visit Diagnosis: Other abnormalities of gait and mobility (R26.89)    Time: 7510-2585 PT Time Calculation (min) (ACUTE ONLY): 20 min   Charges:   PT Evaluation $PT Eval Moderate Complexity: 1 Mod     PT G Codes:       Mabeline Caras, PT, DPT Acute Rehab Services  Pager: [place] 01/18/2018, 10:17 AM

## 2018-01-18 NOTE — Care Management Note (Signed)
Case Management Note  Patient Details  Name: Brenda Moreno MRN: [id number] Date of Birth: 1945-11-03  Subjective/Objective:    Pt admitted with CVA. She is from home with family.             Action/Plan: Pt has orders for [facility] services. CM provided her choice and she selected [facility]. Drew with [facility] notified and accepted the referral.  Pt with orders for walker and 3 in 1. Reggie with [facility] will deliver the equipment to the room. Pt states she has transportation to home when medically ready.  Pt was on Livalo at home but stopped d/t the cost. CM called [employer] to see if they can make a Tier exception for the medication. They will notify the Pt of the decision and she will f/u with her PCP. She is also waiting to hear back from [facility] [employer] to see if she qualifies for low income subsidy.    Expected Discharge Date:                  Expected Discharge Plan:  [place]  In-House Referral:     Discharge planning Services  CM Consult, Medication Assistance  Post Acute Care Choice:    Choice offered to:     DME Arranged:    DME Agency:     HH Arranged:    HH Agency:     Status of Service:  In process, will continue to follow  If discussed at Long Length of Stay Meetings, dates discussed:    Additional Comments:  Pollie Friar, RN 01/18/2018, 4:05 PM

## 2018-01-18 NOTE — Progress Notes (Signed)
  Echocardiogram 2D Echocardiogram has been performed.  Madelaine Etienne 01/18/2018, 11:49 AM

## 2018-01-18 NOTE — Evaluation (Signed)
Speech Language Pathology Evaluation Patient Details Name: Brenda Moreno MRN: [id number] DOB: 03-12-1946 Today's Date: 01/18/2018 Time: 1030-1050 SLP Time Calculation (min) (ACUTE ONLY): 20 min  Problem List:  Patient Active Problem List   Diagnosis Date Noted  . CVA (cerebral vascular accident) ([place]) 01/16/2018  . Chest pain 12/22/2017  . Coronary artery disease involving native coronary artery of native heart without angina pectoris 06/02/2016  . DDD (degenerative disc disease), lumbar   . Diarrhea 07/02/2014  . Fecal incontinence 07/02/2014  . Aftercare following surgery of the circulatory system, [place] 09/26/2013  . 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 Acute sinus infection 07/14/2013  . Arthritis   . Chronic diastolic heart failure ([place])   . Diabetes mellitus, type II, insulin dependent ([place])   . Hyperlipidemia   . Hypertension   . Myocardial infarction ([place])   . PVD (peripheral vascular disease) ([place])   . Peripheral vascular disease, unspecified ([place]) 09/27/2012  . Leg cramps 09/27/2012  . Atherosclerosis of native arteries of the extremities with intermittent claudication 09/22/2011  . Diabetes mellitus ([place]) 11/22/2007  . HYPERCHOLESTEROLEMIA 11/22/2007  . Benign essential HTN 11/22/2007  . HEART ATTACK 11/22/2007  . CAD (coronary artery disease) 11/22/2007  . RENAL CALCULUS 11/22/2007   Past Medical History:  Past Medical History:  Diagnosis Date  . Arthritis   . CAD (coronary artery disease)    S/P cabg  . Carotid artery occlusion   . Cataract   . CHF (congestive heart failure) ([place])   . DDD (degenerative disc disease), lumbar   . Diabetes mellitus   . Hyperlipidemia   . Hypertension   . Joint pain   . Leg pain   . Myocardial infarction ([place]) 1998  . Peripheral vascular disease ([place])   . PVD (peripheral vascular disease) ([place])    Past Surgical History:  Past Surgical History:  Procedure Laterality Date  . APPENDECTOMY    .  BREAST EXCISIONAL BIOPSY Left 2008  . CHOLECYSTECTOMY     Gall bladder  . COLONOSCOPY  2008  . CORONARY ARTERY BYPASS GRAFT     1998  . LEFT HEART CATH AND CORS/GRAFTS ANGIOGRAPHY N/A 12/22/2017   Procedure: LEFT HEART CATH AND CORS/GRAFTS ANGIOGRAPHY;  Surgeon: Burnell Blanks, MD;  Location: [facility];  Service: Cardiovascular;  Laterality: N/A;  . PR VEIN BYPASS GRAFT,AORTO-FEM-POP    . TUBAL LIGATION     HPI:  Pt is a 72 y.o. female with PMH of CAD, DM, HTN, PVD, admitted 01/16/18 with c/o "feeling wobbly." MIR revealed acute/subacute infarction extending from R paramedian midbrain into R superior cerebellar peduncle with no mass-effect or hemorrhage; small chronic infarctions in L cerebellum, pons, with R change of internal capsule and thalamus.   Assessment / Plan / Recommendation Clinical Impression   Pt presents with grossly intact cognitive-linguistic function.  Pt endorses no changes in mentation.  Her speech is fluent and free from dysarthria or word finding impairment.  Pt demonstrated appropriate safety awareness in conversations with speech therapy as well as recall of daily information.  As a result, no further ST needs are indicated at this time.  Discussed s/s of another stroke using BE FAST acronym.  All questions were answered to her satisfaction at this time.      SLP Assessment  SLP Recommendation/Assessment: Patient does not need any further Speech Lanaguage Pathology Services    Follow Up Recommendations  None    Frequency and Duration  SLP Evaluation Cognition  Overall Cognitive Status: Within Functional Limits for tasks assessed Orientation Level: Oriented X4       Comprehension  Auditory Comprehension Overall Auditory Comprehension: Appears within functional limits for tasks assessed    Expression Expression Primary Mode of Expression: Verbal Verbal Expression Overall Verbal Expression: Appears within functional limits for  tasks assessed Written Expression Dominant Hand: Right   Oral / Motor  Oral Motor/Sensory Function Overall Oral Motor/Sensory Function: Within functional limits Motor Speech Overall Motor Speech: Appears within functional limits for tasks assessed   GO                    Brenda Moreno, Brenda Moreno 01/18/2018, 10:59 AM

## 2018-01-18 NOTE — H&P (View-Only) (Signed)
PROGRESS NOTE    Brenda Moreno  NWG:[id number] DOB: 1945/09/25 DOA: 01/16/2018 PCP: Susy Frizzle, MD   Brief Narrative:  Brenda Moreno is an obese 72 y.o. female with medical history significant for DM2, HTN, Diastolic CHF, CABG, PVD, who presented to the ED with complaints of unstable gait that started at about 5 AM this morning when she got up to use the bathroom.  Patient reported she felt like she was moving sideways. She denies lightheadedness or sensation of room spinning.  She has also endorses double vision that started at about the same time, that improves when she closes one eye.   MRI brain done without contrast showed a subcentimeter focus of acute/early subacute infarction extending from right paramedian midbrain to the right superior cerebellar peduncle.  Neurology was consulted- Dr Lorraine Lax, recommended admission to [facility] hospitalist service for CVA workup.  Assessment & Plan:   Principal Problem:   CVA (cerebral vascular accident) ([place]) Active Problems:   Diabetes mellitus ([place])   HYPERCHOLESTEROLEMIA   Benign essential HTN   Peripheral vascular disease, unspecified ([place])   Chronic diastolic heart failure ([facility])   Myocardial infarction ([place])  Acute/Subacute CVA with Symptoms of Unsteady gait.  -Not given tPA because outside of window  -CTA Head and Neck done wo stenosis -MRI brain subcentimeter focus of acute/early subacute infarction extending from right paramedian midbrain to right superior cerebellar peduncle, chronic small infarctions. Small chronic infarctions are present within the left cerebellar nhemisphere, upper pons, right genu of internal capsule, thalamus.  -Patient was complaint with aspirin 81mg  daily.   -Patient has not tolerated statins Lipitor or Crestor in the past-reported lower extremities "giving way".  Was restarted on pravastatin -- resume until other medications can be given  -ASA/plavix x 3 weeks then plavix alone -Continue to Hold  antihypertensives to allow for permissive hypertension in the setting of recent CVA -Transthoracic Echocardiogram: appears to have an LA Mass-- plan for TEE by cardiology (had echo in April that did not show this -LDL 157  -Hgba1c done recently on  12/22/2017- 9.9 -PT/OT-- home health  HTN -Allow for permissive hypertension in the setting of recent CVA -Currently holding hold home Amlodipine 5 mg p.o. daily, Metoprolol Succinate 100 mg daily, as well as Quinapril 20 mg p.o. twice daily -We will need to add back her home blood pressure medications slowly  CAD status post CABG -She had a recent cardiac catheterization on 12/22/2017 which showed severe triple-vessel CAD status post 6 vessel CABG with 5 out of 6 pain grafts.  Per cardiology there is no focal targets for PCI  Chronic Diastolic CHF -Appears euvolemic , no chest pain, recent ECHO - 12/23/17-Ef- 60-65%, no RWMA, high ventricul;ar filling pressure- Mod pulm HTN. -Continue home Lasix 20 mg daily -Home antihypertensives held as above to allow for permissive hypertension  Bradycardia -May need to consider decreasing dose of metoprolol with HR 40s to 60s, symptoms at this time appear to be related to CVA. -Currently metoprolol succinate 100 mg every 24 is being held due to to her CVA  Uncontrolled Insulin Dependent Diabetes Mellitus Type 2 -Last Hgba1c- 9.9.  -Currently receiving 8 units of NovoLog 70/30 mix twice daily with meals.  She takes Novolin 70/30 15 units with breakfast and 7 units with supper at home. -hold metformin as she had a CTA -SSI  CKD Stage III -recheck in AM   DVT prophylaxis: Enoxaparin 40 mg sq q24h Code Status: FULL CODE Family Communication: sleeping at bedside  Disposition Plan: needs TEE.  Consultants:   Neurology   Procedures:  ECHOCARDIOGRAM   Antimicrobials:  Anti-infectives (From admission, onward)   None     Subjective: Feeling well, just worked with OT No chest pain , no  SOB   Objective: Vitals:   01/17/18 2340 01/18/18 0607 01/18/18 0733 01/18/18 1213  BP: (!) 145/70 (!) 152/61 (!) 148/56 (!) 160/65  Pulse: (!) 52 (!) 51 (!) 48 (!) 53  Resp: 20 20 17 17   Temp: 97.7 F (36.5 C) 97.6 F (36.4 C) 97.8 F (36.6 C) (!) 97.5 F (36.4 C)  TempSrc: Oral Oral Oral Oral  SpO2: 97% 98% 98% 99%  Weight:      Height:       No intake or output data in the 24 hours ending 01/18/18 1325 Filed Weights   01/16/18 1305 01/17/18 2056  Weight: 83.9 kg (185 lb) 89.9 kg (198 lb 3.1 oz)   Examination: Physical Exam:  Constitutional: NAD No increased work of breathing or wheezing Cardiovascular: rrr Abdomen: +BS, soft Normal judgement and insight  Data Reviewed: I have personally reviewed following labs and imaging studies  CBC: Recent Labs  Lab 01/16/18 1319 01/16/18 1327 01/17/18 0909  WBC 6.1  --  6.2  NEUTROABS 3.3  --  3.2  HGB 13.8 14.6 12.0  HCT 40.9 43.0 35.4*  MCV 82.1  --  80.5  PLT 198  --  563   Basic Metabolic Panel: Recent Labs  Lab 01/16/18 1319 01/16/18 1327 01/17/18 0909  NA 142 143 140  K 4.2 4.2 4.2  CL 108 107 107  CO2 24  --  25  GLUCOSE 193* 183* 306*  BUN 25* 24* 29*  CREATININE 1.20* 1.10* 1.31*  CALCIUM 9.4  --  8.8*  MG  --   --  1.9  PHOS  --   --  3.7   GFR: Estimated Creatinine Clearance: 44 mL/min (A) (by C-G formula based on SCr of 1.31 mg/dL (H)). Liver Function Tests: Recent Labs  Lab 01/16/18 1319 01/17/18 0909  AST 18 16  ALT 17 15  ALKPHOS 82 75  BILITOT 0.4 0.4  PROT 7.7 6.1*  ALBUMIN 4.0 3.2*   No results for input(s): LIPASE, AMYLASE in the last 168 hours. No results for input(s): AMMONIA in the last 168 hours. Coagulation Profile: Recent Labs  Lab 01/16/18 1319  INR 0.88   Cardiac Enzymes: No results for input(s): CKTOTAL, CKMB, CKMBINDEX, TROPONINI in the last 168 hours. BNP (last 3 results) No results for input(s): PROBNP in the last 8760 hours. HbA1C: Recent Labs     01/17/18 0909  HGBA1C 8.9*   CBG: Recent Labs  Lab 01/17/18 0904 01/17/18 1301 01/17/18 1822 01/17/18 2250 01/18/18 0614  GLUCAP 275* 92 110* 259* 131*   Lipid Profile: Recent Labs    01/17/18 0909  CHOL 157  HDL 36*  LDLCALC 95  TRIG 131  CHOLHDL 4.4   Thyroid Function Tests: No results for input(s): TSH, T4TOTAL, FREET4, T3FREE, THYROIDAB in the last 72 hours. Anemia Panel: No results for input(s): VITAMINB12, FOLATE, FERRITIN, TIBC, IRON, RETICCTPCT in the last 72 hours. Sepsis Labs: No results for input(s): PROCALCITON, LATICACIDVEN in the last 168 hours.  No results found for this or any previous visit (from the past 240 hour(s)).   Radiology Studies: Ct Angio Head W Or Wo Contrast  Result Date: 01/16/2018 CLINICAL DATA:  72 y/o F; dizziness with trouble balancing. History of cardiac catheterization 12/22/2017. EXAM: CT  ANGIOGRAPHY HEAD AND NECK TECHNIQUE: Multidetector CT imaging of the head and neck was performed using the standard protocol during bolus administration of intravenous contrast. Multiplanar CT image reconstructions and MIPs were obtained to evaluate the vascular anatomy. Carotid stenosis measurements (when applicable) are obtained utilizing NASCET criteria, using the distal internal carotid diameter as the denominator. CONTRAST:  160 cc Isovue 370 COMPARISON:  03/23/2017 CT head.  07/06/2010 MRI head. FINDINGS: CT HEAD FINDINGS Brain: No evidence of acute infarction, hemorrhage, hydrocephalus, extra-axial collection or mass lesion/mass effect. Stable small chronic lacunar infarct in the left thalamus. Stable chronic microvascular ischemic changes of white matter and parenchymal volume loss the brain. Vascular: As below. Skull: Normal. Negative for fracture or focal lesion. Sinuses: Imaged portions are clear. Orbits: No acute finding. Review of the MIP images confirms the above findings CTA NECK FINDINGS Aortic arch: Standard branching. Imaged portion shows no  evidence of aneurysm or dissection. Post CABG and median sternotomy. Mild calcific atherosclerosis. Calcific atherosclerosis of left subclavian artery origin, partially obscured by streak artifact, probable 50% moderate underlying stenosis. Right carotid system: No evidence of dissection, stenosis (50% or greater) or occlusion. Non stenotic calcified plaque of carotid bifurcation. Left carotid system: No evidence of dissection, stenosis (50% or greater) or occlusion. Vertebral arteries: Codominant. No evidence of dissection, stenosis (50% or greater) or occlusion. Calcific atherosclerosis of vertebral artery origins with mild less than 50% stenosis. Skeleton: C5-6 anterior cervical discectomy. No acute osseous abnormality. Other neck: Negative. Upper chest: Negative. Review of the MIP images confirms the above findings CTA HEAD FINDINGS Anterior circulation: Calcific atherosclerosis of bilateral carotid siphons with mild right paraclinoid and mild-to-moderate distal left cavernous stenosis. No high-grade stenosis, proximal occlusion, aneurysm, or vascular malformation. Posterior circulation: Calcific atherosclerosis of bilateral V4 segments with mild stenosis. No additional segment of significant stenosis, proximal occlusion, aneurysm, or vascular malformation. Venous sinuses: As permitted by contrast timing, patent. Anatomic variants: No posterior communicating artery identified, likely hypoplastic or absent. Patent anterior communicating artery. Delayed phase: No abnormal intracranial enhancement. Review of the MIP images confirms the above findings IMPRESSION: CT head: 1. No acute intracranial abnormality identified. 2. Stable chronic microvascular ischemic changes and parenchymal volume loss of the brain. CTA neck: 1. Patent carotid and vertebral arteries. No significant stenosis by NASCET criteria, dissection, or occlusion. 2. Calcified plaque bilateral vertebral artery origins with mild less than 50% stenosis.  3. Calcified plaque of left subclavian artery origin, partially obscured by streak artifact, approximately 50% mild-to-moderate stenosis. CTA head: 1. Patent anterior and posterior intracranial circulation. No high-grade stenosis, aneurysm, large vessel occlusion, or vascular malformation. 2. Multiple segments of mild stenosis in the anterior and posterior circulations as well as mild-to-moderate distal left cavernous ICA stenosis. Electronically Signed   By: Kristine Garbe M.D.   On: 01/16/2018 17:08   Ct Angio Neck W And/or Wo Contrast  Result Date: 01/16/2018 CLINICAL DATA:  72 y/o F; dizziness with trouble balancing. History of cardiac catheterization 12/22/2017. EXAM: CT ANGIOGRAPHY HEAD AND NECK TECHNIQUE: Multidetector CT imaging of the head and neck was performed using the standard protocol during bolus administration of intravenous contrast. Multiplanar CT image reconstructions and MIPs were obtained to evaluate the vascular anatomy. Carotid stenosis measurements (when applicable) are obtained utilizing NASCET criteria, using the distal internal carotid diameter as the denominator. CONTRAST:  160 cc Isovue 370 COMPARISON:  03/23/2017 CT head.  07/06/2010 MRI head. FINDINGS: CT HEAD FINDINGS Brain: No evidence of acute infarction, hemorrhage, hydrocephalus, extra-axial collection or mass lesion/mass  effect. Stable small chronic lacunar infarct in the left thalamus. Stable chronic microvascular ischemic changes of white matter and parenchymal volume loss the brain. Vascular: As below. Skull: Normal. Negative for fracture or focal lesion. Sinuses: Imaged portions are clear. Orbits: No acute finding. Review of the MIP images confirms the above findings CTA NECK FINDINGS Aortic arch: Standard branching. Imaged portion shows no evidence of aneurysm or dissection. Post CABG and median sternotomy. Mild calcific atherosclerosis. Calcific atherosclerosis of left subclavian artery origin, partially  obscured by streak artifact, probable 50% moderate underlying stenosis. Right carotid system: No evidence of dissection, stenosis (50% or greater) or occlusion. Non stenotic calcified plaque of carotid bifurcation. Left carotid system: No evidence of dissection, stenosis (50% or greater) or occlusion. Vertebral arteries: Codominant. No evidence of dissection, stenosis (50% or greater) or occlusion. Calcific atherosclerosis of vertebral artery origins with mild less than 50% stenosis. Skeleton: C5-6 anterior cervical discectomy. No acute osseous abnormality. Other neck: Negative. Upper chest: Negative. Review of the MIP images confirms the above findings CTA HEAD FINDINGS Anterior circulation: Calcific atherosclerosis of bilateral carotid siphons with mild right paraclinoid and mild-to-moderate distal left cavernous stenosis. No high-grade stenosis, proximal occlusion, aneurysm, or vascular malformation. Posterior circulation: Calcific atherosclerosis of bilateral V4 segments with mild stenosis. No additional segment of significant stenosis, proximal occlusion, aneurysm, or vascular malformation. Venous sinuses: As permitted by contrast timing, patent. Anatomic variants: No posterior communicating artery identified, likely hypoplastic or absent. Patent anterior communicating artery. Delayed phase: No abnormal intracranial enhancement. Review of the MIP images confirms the above findings IMPRESSION: CT head: 1. No acute intracranial abnormality identified. 2. Stable chronic microvascular ischemic changes and parenchymal volume loss of the brain. CTA neck: 1. Patent carotid and vertebral arteries. No significant stenosis by NASCET criteria, dissection, or occlusion. 2. Calcified plaque bilateral vertebral artery origins with mild less than 50% stenosis. 3. Calcified plaque of left subclavian artery origin, partially obscured by streak artifact, approximately 50% mild-to-moderate stenosis. CTA head: 1. Patent anterior  and posterior intracranial circulation. No high-grade stenosis, aneurysm, large vessel occlusion, or vascular malformation. 2. Multiple segments of mild stenosis in the anterior and posterior circulations as well as mild-to-moderate distal left cavernous ICA stenosis. Electronically Signed   By: Kristine Garbe M.D.   On: 01/16/2018 17:08   Mr Brain Wo Contrast  Result Date: 01/16/2018 CLINICAL DATA:  72 y/o  F; ataxia, stroke suspected. EXAM: MRI HEAD WITHOUT CONTRAST TECHNIQUE: Multiplanar, multiecho pulse sequences of the brain and surrounding structures were obtained without intravenous contrast. COMPARISON:  01/16/2018 CT angiogram of the head and neck. FINDINGS: Brain: Subcentimeter focus of reduced diffusion extending from the right paramedian midbrain to the right superior cerebellar peduncle (series 3, image [id number]). No associated hemorrhage or mass effect. Small chronic infarctions are present within the left cerebellar hemisphere, upper pons, right genu of internal capsule, and left thalamus. Patchy nonspecific foci of T2 FLAIR hyperintense signal abnormality in subcortical and periventricular white matter are compatible with moderate chronic microvascular ischemic changes for age. Moderate brain parenchymal volume loss. Vascular: Normal flow voids. Skull and upper cervical spine: Normal marrow signal. Sinuses/Orbits: Negative. Other: None. IMPRESSION: 1. Subcentimeter focus of acute/early subacute infarction extending from the right paramedian midbrain the right superior cerebellar peduncle. No associated hemorrhage or mass effect. 2. Small chronic infarctions are present within the left cerebellar hemisphere, upper pons, right genu of internal capsule, thalamus. 3. Moderate for age chronic microvascular ischemic changes and parenchymal volume loss of the brain. These results were  called by telephone at the time of interpretation on 01/16/2018 at 7:43 pm to Dr. Sherwood Gambler , who verbally  acknowledged these results. Electronically Signed   By: Kristine Garbe M.D.   On: 01/16/2018 19:45   Scheduled Meds: . aspirin  81 mg Oral Daily  . clopidogrel  75 mg Oral Daily  . enoxaparin (LOVENOX) injection  40 mg Subcutaneous Q24H  . furosemide  20 mg Oral Daily  . insulin aspart  0-9 Units Subcutaneous TID WC  . insulin aspart protamine- aspart  8 Units Subcutaneous BID WC  . metFORMIN  1,000 mg Oral BID WC  . pioglitazone  30 mg Oral Daily  . potassium chloride  10 mEq Oral Daily  . pravastatin  80 mg Oral QPM   Continuous Infusions:   LOS: 0 days   Geradine Girt, DO [facility]  If 7PM-7AM, please contact night-coverage [email or web address] Password [email or web address] 01/18/2018, 1:25 PM

## 2018-01-18 NOTE — Progress Notes (Addendum)
STROKE TEAM PROGRESS NOTE   SUBJECTIVE (INTERVAL HISTORY) No family is at the bedside.  She is up in the bed, eating. Ready to go home. HH PT and OT recommended. Was on asa 81 PTA, will change to asa 81 and plavix 75 x 3 weeks, then plavix alone. She has been on multiple statins with myalgias in the past, typically occurring after taking x 3 months. She has only been on pravachol x 2 weeks. Recommend resumption at d/c. PCP will continue to try to get her back on livolo or consider PSCK-9. Discussed with pt and Dr. Eliseo Squires.  OBJECTIVE Vitals:   01/17/18 2056 01/17/18 2340 01/18/18 0607 01/18/18 0733  BP: 120/90 (!) 145/70 (!) 152/61 (!) 148/56  Pulse: (!) 47 (!) 52 (!) 51 (!) 48  Resp: 20 20 20 17   Temp: 98 F (36.7 C) 97.7 F (36.5 C) 97.6 F (36.4 C) 97.8 F (36.6 C)  TempSrc: Oral Oral Oral Oral  SpO2: 100% 97% 98% 98%  Weight: 89.9 kg (198 lb 3.1 oz)     Height:        CBC:  Recent Labs  Lab 01/16/18 1319 01/16/18 1327 01/17/18 0909  WBC 6.1  --  6.2  NEUTROABS 3.3  --  3.2  HGB 13.8 14.6 12.0  HCT 40.9 43.0 35.4*  MCV 82.1  --  80.5  PLT 198  --  956    Basic Metabolic Panel:  Recent Labs  Lab 01/16/18 1319 01/16/18 1327 01/17/18 0909  NA 142 143 140  K 4.2 4.2 4.2  CL 108 107 107  CO2 24  --  25  GLUCOSE 193* 183* 306*  BUN 25* 24* 29*  CREATININE 1.20* 1.10* 1.31*  CALCIUM 9.4  --  8.8*  MG  --   --  1.9  PHOS  --   --  3.7    Lipid Panel:     Component Value Date/Time   CHOL 157 01/17/2018 0909   TRIG 131 01/17/2018 0909   HDL 36 (L) 01/17/2018 0909   CHOLHDL 4.4 01/17/2018 0909   VLDL 26 01/17/2018 0909   LDLCALC 95 01/17/2018 0909   HgbA1c:  Lab Results  Component Value Date   HGBA1C 8.9 (H) 01/17/2018   Urine Drug Screen:     Component Value Date/Time   LABOPIA NONE DETECTED 03/20/2017 1211   COCAINSCRNUR NONE DETECTED 03/20/2017 1211   LABBENZ NONE DETECTED 03/20/2017 1211   AMPHETMU NONE DETECTED 03/20/2017 1211   THCU NONE  DETECTED 03/20/2017 1211   LABBARB NONE DETECTED 03/20/2017 1211    Alcohol Level No results found for: ETH  IMAGING  Ct Angio Head W Or Wo Contrast  Result Date: 01/16/2018 CLINICAL DATA:  72 y/o F; dizziness with trouble balancing. History of cardiac catheterization 12/22/2017. EXAM: CT ANGIOGRAPHY HEAD AND NECK TECHNIQUE: Multidetector CT imaging of the head and neck was performed using the standard protocol during bolus administration of intravenous contrast. Multiplanar CT image reconstructions and MIPs were obtained to evaluate the vascular anatomy. Carotid stenosis measurements (when applicable) are obtained utilizing NASCET criteria, using the distal internal carotid diameter as the denominator. CONTRAST:  160 cc Isovue 370 COMPARISON:  03/23/2017 CT head.  07/06/2010 MRI head. FINDINGS: CT HEAD FINDINGS Brain: No evidence of acute infarction, hemorrhage, hydrocephalus, extra-axial collection or mass lesion/mass effect. Stable small chronic lacunar infarct in the left thalamus. Stable chronic microvascular ischemic changes of white matter and parenchymal volume loss the brain. Vascular: As below. Skull: Normal. Negative for  fracture or focal lesion. Sinuses: Imaged portions are clear. Orbits: No acute finding. Review of the MIP images confirms the above findings CTA NECK FINDINGS Aortic arch: Standard branching. Imaged portion shows no evidence of aneurysm or dissection. Post CABG and median sternotomy. Mild calcific atherosclerosis. Calcific atherosclerosis of left subclavian artery origin, partially obscured by streak artifact, probable 50% moderate underlying stenosis. Right carotid system: No evidence of dissection, stenosis (50% or greater) or occlusion. Non stenotic calcified plaque of carotid bifurcation. Left carotid system: No evidence of dissection, stenosis (50% or greater) or occlusion. Vertebral arteries: Codominant. No evidence of dissection, stenosis (50% or greater) or occlusion.  Calcific atherosclerosis of vertebral artery origins with mild less than 50% stenosis. Skeleton: C5-6 anterior cervical discectomy. No acute osseous abnormality. Other neck: Negative. Upper chest: Negative. Review of the MIP images confirms the above findings CTA HEAD FINDINGS Anterior circulation: Calcific atherosclerosis of bilateral carotid siphons with mild right paraclinoid and mild-to-moderate distal left cavernous stenosis. No high-grade stenosis, proximal occlusion, aneurysm, or vascular malformation. Posterior circulation: Calcific atherosclerosis of bilateral V4 segments with mild stenosis. No additional segment of significant stenosis, proximal occlusion, aneurysm, or vascular malformation. Venous sinuses: As permitted by contrast timing, patent. Anatomic variants: No posterior communicating artery identified, likely hypoplastic or absent. Patent anterior communicating artery. Delayed phase: No abnormal intracranial enhancement. Review of the MIP images confirms the above findings IMPRESSION: CT head: 1. No acute intracranial abnormality identified. 2. Stable chronic microvascular ischemic changes and parenchymal volume loss of the brain. CTA neck: 1. Patent carotid and vertebral arteries. No significant stenosis by NASCET criteria, dissection, or occlusion. 2. Calcified plaque bilateral vertebral artery origins with mild less than 50% stenosis. 3. Calcified plaque of left subclavian artery origin, partially obscured by streak artifact, approximately 50% mild-to-moderate stenosis. CTA head: 1. Patent anterior and posterior intracranial circulation. No high-grade stenosis, aneurysm, large vessel occlusion, or vascular malformation. 2. Multiple segments of mild stenosis in the anterior and posterior circulations as well as mild-to-moderate distal left cavernous ICA stenosis. Electronically Signed   By: Kristine Garbe M.D.   On: 01/16/2018 17:08   Ct Angio Neck W And/or Wo Contrast  Result  Date: 01/16/2018 CLINICAL DATA:  72 y/o F; dizziness with trouble balancing. History of cardiac catheterization 12/22/2017. EXAM: CT ANGIOGRAPHY HEAD AND NECK TECHNIQUE: Multidetector CT imaging of the head and neck was performed using the standard protocol during bolus administration of intravenous contrast. Multiplanar CT image reconstructions and MIPs were obtained to evaluate the vascular anatomy. Carotid stenosis measurements (when applicable) are obtained utilizing NASCET criteria, using the distal internal carotid diameter as the denominator. CONTRAST:  160 cc Isovue 370 COMPARISON:  03/23/2017 CT head.  07/06/2010 MRI head. FINDINGS: CT HEAD FINDINGS Brain: No evidence of acute infarction, hemorrhage, hydrocephalus, extra-axial collection or mass lesion/mass effect. Stable small chronic lacunar infarct in the left thalamus. Stable chronic microvascular ischemic changes of white matter and parenchymal volume loss the brain. Vascular: As below. Skull: Normal. Negative for fracture or focal lesion. Sinuses: Imaged portions are clear. Orbits: No acute finding. Review of the MIP images confirms the above findings CTA NECK FINDINGS Aortic arch: Standard branching. Imaged portion shows no evidence of aneurysm or dissection. Post CABG and median sternotomy. Mild calcific atherosclerosis. Calcific atherosclerosis of left subclavian artery origin, partially obscured by streak artifact, probable 50% moderate underlying stenosis. Right carotid system: No evidence of dissection, stenosis (50% or greater) or occlusion. Non stenotic calcified plaque of carotid bifurcation. Left carotid system: No evidence of  dissection, stenosis (50% or greater) or occlusion. Vertebral arteries: Codominant. No evidence of dissection, stenosis (50% or greater) or occlusion. Calcific atherosclerosis of vertebral artery origins with mild less than 50% stenosis. Skeleton: C5-6 anterior cervical discectomy. No acute osseous abnormality. Other  neck: Negative. Upper chest: Negative. Review of the MIP images confirms the above findings CTA HEAD FINDINGS Anterior circulation: Calcific atherosclerosis of bilateral carotid siphons with mild right paraclinoid and mild-to-moderate distal left cavernous stenosis. No high-grade stenosis, proximal occlusion, aneurysm, or vascular malformation. Posterior circulation: Calcific atherosclerosis of bilateral V4 segments with mild stenosis. No additional segment of significant stenosis, proximal occlusion, aneurysm, or vascular malformation. Venous sinuses: As permitted by contrast timing, patent. Anatomic variants: No posterior communicating artery identified, likely hypoplastic or absent. Patent anterior communicating artery. Delayed phase: No abnormal intracranial enhancement. Review of the MIP images confirms the above findings IMPRESSION: CT head: 1. No acute intracranial abnormality identified. 2. Stable chronic microvascular ischemic changes and parenchymal volume loss of the brain. CTA neck: 1. Patent carotid and vertebral arteries. No significant stenosis by NASCET criteria, dissection, or occlusion. 2. Calcified plaque bilateral vertebral artery origins with mild less than 50% stenosis. 3. Calcified plaque of left subclavian artery origin, partially obscured by streak artifact, approximately 50% mild-to-moderate stenosis. CTA head: 1. Patent anterior and posterior intracranial circulation. No high-grade stenosis, aneurysm, large vessel occlusion, or vascular malformation. 2. Multiple segments of mild stenosis in the anterior and posterior circulations as well as mild-to-moderate distal left cavernous ICA stenosis. Electronically Signed   By: Kristine Garbe M.D.   On: 01/16/2018 17:08   Mr Brain Wo Contrast  Result Date: 01/16/2018 CLINICAL DATA:  72 y/o  F; ataxia, stroke suspected. EXAM: MRI HEAD WITHOUT CONTRAST TECHNIQUE: Multiplanar, multiecho pulse sequences of the brain and surrounding  structures were obtained without intravenous contrast. COMPARISON:  01/16/2018 CT angiogram of the head and neck. FINDINGS: Brain: Subcentimeter focus of reduced diffusion extending from the right paramedian midbrain to the right superior cerebellar peduncle (series 3, image [id number]). No associated hemorrhage or mass effect. Small chronic infarctions are present within the left cerebellar hemisphere, upper pons, right genu of internal capsule, and left thalamus. Patchy nonspecific foci of T2 FLAIR hyperintense signal abnormality in subcortical and periventricular white matter are compatible with moderate chronic microvascular ischemic changes for age. Moderate brain parenchymal volume loss. Vascular: Normal flow voids. Skull and upper cervical spine: Normal marrow signal. Sinuses/Orbits: Negative. Other: None. IMPRESSION: 1. Subcentimeter focus of acute/early subacute infarction extending from the right paramedian midbrain the right superior cerebellar peduncle. No associated hemorrhage or mass effect. 2. Small chronic infarctions are present within the left cerebellar hemisphere, upper pons, right genu of internal capsule, thalamus. 3. Moderate for age chronic microvascular ischemic changes and parenchymal volume loss of the brain. These results were called by telephone at the time of interpretation on 01/16/2018 at 7:43 pm to Dr. Sherwood Gambler , who verbally acknowledged these results. Electronically Signed   By: Kristine Garbe M.D.   On: 01/16/2018 19:45   2D Echocardiogram  - Left ventricle: The cavity size was normal. Systolic function was   normal. The estimated ejection fraction was in the range of 60%   to 65%. Wall motion was normal; there were no regional wall   motion abnormalities. The study is not technically sufficient to   allow evaluation of LV diastolic function. - Aortic valve: Severe focal calcification involving the   noncoronary cusp. - Mitral valve: There was trivial  regurgitation. - Left  atrium: There is a 3.2 x 1.07 oblong density in the LA that   could represent thrombus. Recommend TEE for further delineation. - Atrial septum: There was increased thickness of the septum,   consistent with lipomatous hypertrophy. - Pulmonic valve: There was trivial regurgitation. - Pulmonary arteries: PA peak pressure: 39 mm Hg (S).  Impressions:  - Possible LA mass although only seen in the parasternal long axis   view. Recommend TEE for further delineation. The right   ventricular systolic pressure was increased consistent with mild   pulmonary hypertension.  PHYSICAL EXAM General: awake, alert, NAD HEENT: Normocephalic, mucous membranes moist and pink Lungs: saturation WNL, no work of breathing noted. Extremities: warm and well perfused, intact pulses  Neuro: Mental Status: Alert, oriented, thought content appropriate.  Speech fluent without evidence of aphasia.  Able to follow 3 step commands without difficulty. Denies double vision. Cranial Nerves: II: ; Visual fields grossly normal, cataracts noted bilaterally III,IV, VI: ptosis not present, extra-ocular motions intact bilaterally pupils equal, round, reactive to light and accommodation V,VII: smile symmetric, facial light touch sensation normal bilaterally VIII: hearing normal bilaterally IX,X: uvula rises symmetrically XI: bilateral shoulder shrug XII: midline tongue extension Motor: Right :  Upper extremity   5/5                                      Left:     Upper extremity   4+/5             Lower extremity   5/5                                                  Lower extremity   4+/5 Tone and bulk:normal tone throughout; no atrophy noted Decreased FMM L hand. R hand orbits L Sensory: light touch intact throughout, bilaterally Deep Tendon Reflexes: 2+ and symmetric throughout Plantars: Right: downgoing                                Left: downgoing Cerebellar: normal finger-to-nose Gait:  did not assess   ASSESSMENT/PLAN Brenda Moreno is a 72 y.o. female with history of HTN, HLD w/ statin intolerance and uncontrolled DB presenting with gait instability. Pt though r/t to statin as she has had myalgias on statins in past and was started on pravachol 80 mg 2 weeks ago.   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   CTA head mild stenosis anterior & posterior circulation and distal L ICA  CTA neck  no sign stenosis. B VA < 50%. Calcified plaque L SCA 50% stenosis  MRI head R paramedial midbrain subcentimeter infarct. Old L cerebellar, upper pontine, R genu IC, thalamic infarcts. Moderate Small vessel disease. Atrophy.   2D Echo  EF 60-65%. Severe calcification AV. LA density, possible thrombus. Mild pulm HTN.  LDL 95  HgbA1c 8.9  Lovenox 40 mg sq daily for VTE prophylaxis  aspirin 81 mg daily prior to admission, now on aspirin 81 mg daily. Given mild stroke, will place on aspirin 81 mg and plavix 75 mg daily x 3 weeks, then plavix alone. Orders  adjusted.  Patient counseled to be compliant with her antithrombotic medications  Ongoing aggressive stroke risk factor management  Therapy recommendations:  HH PT and OT  Disposition:  home  Possible LA thrombus  See on 2D echo in 1 view  TEE to further evaluate  Scheduled for tomorrow w/ cardiology  Hypertension  Elevated 140-160s .  Permissive hypertension (OK if < 220/120) but gradually normalize in 5-7 days .  Long-term BP goal normotensive  Hyperlipidemia  Home meds:  pravachol 80, resumed in hospital. stated that she is allergic to statins except for Livalo (myalgias)  LDL 95, goal < 70  Continue pravachol at discharge; PCP to continue to [employer] of Livalo. May also consider PSCK-9.  Diabetes type II  HgbA1c 8.9, goal < 7.0  Uncontrolled  Other Stroke Risk Factors  Advanced  age  Former Cigarette smoker, quit 6 years agoadvised to stop smoking  Obesity, Body mass index is 34.02 kg/m., recommend weight loss, diet and exercise as appropriate   Coronary artery disease, MI s/p CABG  CHF  PVD  Hospital day # 0  Burnetta Sabin, MSN, APRN, ANVP-BC, AGPCNP-BC Advanced Practice Stroke Nurse [facility] for Schedule & Pager information 01/18/2018 1:08 PM   ATTENDING NOTE: I reviewed above note and agree with the assessment and plan. I have made any additions or clarifications directly to the above note. Pt was seen and examined.   72 year old female with history of CAD/MI status post CABG, CHF, DM, HTN, HLD, PVD admitted for wobbly gait and leaning toward to the right.  MRI showed acute right pontine paramedian infarct, as well as chronic left cerebellum, left upper pons, right thalamic infarcts.  CT head neck showed bilateral ICA siphon and bilateral VA V1/V4 atherosclerosis, left more than right, but no significant stenosis.  A1c 9.9 and LDL 167.  2D echo EF 60 to 65%, however concerning for LA mass 3.21.07.  Recommend TEE.  Scheduled for tomorrow.  Patient stroke by location likely small vessel disease given multiple stroke risk factors.  DM and HLD not in control.  Patient had myalgia with statin use, currently on pravastatin 80, will continue.  Continue aspirin 81 and the Plavix for 3 weeks and then Plavix alone.  However given possible LA mass on TTE, will need TEE to rule out cardioembolic source.  Will follow.  Rosalin Hawking, MD PhD Stroke Neurology 01/18/2018 5:53 PM   To contact Stroke Continuity provider, please refer to [email or web address] After hours, contact General Neurology

## 2018-01-18 NOTE — Progress Notes (Addendum)
PROGRESS NOTE    Brenda Moreno  SPQ:[id number] DOB: 1945-11-03 DOA: 01/16/2018 PCP: Susy Frizzle, MD   Brief Narrative:  Brenda Moreno is an obese 72 y.o. female with medical history significant for DM2, HTN, Diastolic CHF, CABG, PVD, who presented to the ED with complaints of unstable gait that started at about 5 AM this morning when she got up to use the bathroom.  Patient reported she felt like she was moving sideways. She denies lightheadedness or sensation of room spinning.  She has also endorses double vision that started at about the same time, that improves when she closes one eye.   MRI brain done without contrast showed a subcentimeter focus of acute/early subacute infarction extending from right paramedian midbrain to the right superior cerebellar peduncle.  Neurology was consulted- Dr Lorraine Lax, recommended admission to [facility] hospitalist service for CVA workup.  Assessment & Plan:   Principal Problem:   CVA (cerebral vascular accident) ([place]) Active Problems:   Diabetes mellitus ([place])   HYPERCHOLESTEROLEMIA   Benign essential HTN   Peripheral vascular disease, unspecified ([place])   Chronic diastolic heart failure (HCC)   Myocardial infarction ([place])  Acute/Subacute CVA with Symptoms of Unsteady gait.  -Not given tPA because outside of window  -CTA Head and Neck done wo stenosis -MRI brain subcentimeter focus of acute/early subacute infarction extending from right paramedian midbrain to right superior cerebellar peduncle, chronic small infarctions. Small chronic infarctions are present within the left cerebellar nhemisphere, upper pons, right genu of internal capsule, thalamus.  -Patient was complaint with aspirin 81mg  daily.   -Patient has not tolerated statins Lipitor or Crestor in the past-reported lower extremities "giving way".  Was restarted on pravastatin -- resume until other medications can be given  -ASA/plavix x 3 weeks then plavix alone -Continue to Hold  antihypertensives to allow for permissive hypertension in the setting of recent CVA -Transthoracic Echocardiogram: appears to have an LA Mass-- plan for TEE by cardiology (had echo in April that did not show this -LDL 157  -Hgba1c done recently on  12/22/2017- 9.9 -PT/OT-- home health  HTN -Allow for permissive hypertension in the setting of recent CVA -Currently holding hold home Amlodipine 5 mg p.o. daily, Metoprolol Succinate 100 mg daily, as well as Quinapril 20 mg p.o. twice daily -We will need to add back her home blood pressure medications slowly  CAD status post CABG -She had a recent cardiac catheterization on 12/22/2017 which showed severe triple-vessel CAD status post 6 vessel CABG with 5 out of 6 pain grafts.  Per cardiology there is no focal targets for PCI  Chronic Diastolic CHF -Appears euvolemic , no chest pain, recent ECHO - 12/23/17-Ef- 60-65%, no RWMA, high ventricul;ar filling pressure- Mod pulm HTN. -Continue home Lasix 20 mg daily -Home antihypertensives held as above to allow for permissive hypertension  Bradycardia -May need to consider decreasing dose of metoprolol with HR 40s to 60s, symptoms at this time appear to be related to CVA. -Currently metoprolol succinate 100 mg every 24 is being held due to to her CVA  Uncontrolled Insulin Dependent Diabetes Mellitus Type 2 -Last Hgba1c- 9.9.  -Currently receiving 8 units of NovoLog 70/30 mix twice daily with meals.  She takes Novolin 70/30 15 units with breakfast and 7 units with supper at home. -hold metformin as she had a CTA -SSI  CKD Stage III -recheck in AM   DVT prophylaxis: Enoxaparin 40 mg sq q24h Code Status: FULL CODE Family Communication: sleeping at bedside  Disposition Plan: needs TEE.  Consultants:   Neurology   Procedures:  ECHOCARDIOGRAM   Antimicrobials:  Anti-infectives (From admission, onward)   None     Subjective: Feeling well, just worked with OT No chest pain , no  SOB   Objective: Vitals:   01/17/18 2340 01/18/18 0607 01/18/18 0733 01/18/18 1213  BP: (!) 145/70 (!) 152/61 (!) 148/56 (!) 160/65  Pulse: (!) 52 (!) 51 (!) 48 (!) 53  Resp: 20 20 17 17   Temp: 97.7 F (36.5 C) 97.6 F (36.4 C) 97.8 F (36.6 C) (!) 97.5 F (36.4 C)  TempSrc: Oral Oral Oral Oral  SpO2: 97% 98% 98% 99%  Weight:      Height:       No intake or output data in the 24 hours ending 01/18/18 1325 Filed Weights   01/16/18 1305 01/17/18 2056  Weight: 83.9 kg (185 lb) 89.9 kg (198 lb 3.1 oz)   Examination: Physical Exam:  Constitutional: NAD No increased work of breathing or wheezing Cardiovascular: rrr Abdomen: +BS, soft Normal judgement and insight  Data Reviewed: I have personally reviewed following labs and imaging studies  CBC: Recent Labs  Lab 01/16/18 1319 01/16/18 1327 01/17/18 0909  WBC 6.1  --  6.2  NEUTROABS 3.3  --  3.2  HGB 13.8 14.6 12.0  HCT 40.9 43.0 35.4*  MCV 82.1  --  80.5  PLT 198  --  947   Basic Metabolic Panel: Recent Labs  Lab 01/16/18 1319 01/16/18 1327 01/17/18 0909  NA 142 143 140  K 4.2 4.2 4.2  CL 108 107 107  CO2 24  --  25  GLUCOSE 193* 183* 306*  BUN 25* 24* 29*  CREATININE 1.20* 1.10* 1.31*  CALCIUM 9.4  --  8.8*  MG  --   --  1.9  PHOS  --   --  3.7   GFR: Estimated Creatinine Clearance: 44 mL/min (A) (by C-G formula based on SCr of 1.31 mg/dL (H)). Liver Function Tests: Recent Labs  Lab 01/16/18 1319 01/17/18 0909  AST 18 16  ALT 17 15  ALKPHOS 82 75  BILITOT 0.4 0.4  PROT 7.7 6.1*  ALBUMIN 4.0 3.2*   No results for input(s): LIPASE, AMYLASE in the last 168 hours. No results for input(s): AMMONIA in the last 168 hours. Coagulation Profile: Recent Labs  Lab 01/16/18 1319  INR 0.88   Cardiac Enzymes: No results for input(s): CKTOTAL, CKMB, CKMBINDEX, TROPONINI in the last 168 hours. BNP (last 3 results) No results for input(s): PROBNP in the last 8760 hours. HbA1C: Recent Labs     01/17/18 0909  HGBA1C 8.9*   CBG: Recent Labs  Lab 01/17/18 0904 01/17/18 1301 01/17/18 1822 01/17/18 2250 01/18/18 0614  GLUCAP 275* 92 110* 259* 131*   Lipid Profile: Recent Labs    01/17/18 0909  CHOL 157  HDL 36*  LDLCALC 95  TRIG 131  CHOLHDL 4.4   Thyroid Function Tests: No results for input(s): TSH, T4TOTAL, FREET4, T3FREE, THYROIDAB in the last 72 hours. Anemia Panel: No results for input(s): VITAMINB12, FOLATE, FERRITIN, TIBC, IRON, RETICCTPCT in the last 72 hours. Sepsis Labs: No results for input(s): PROCALCITON, LATICACIDVEN in the last 168 hours.  No results found for this or any previous visit (from the past 240 hour(s)).   Radiology Studies: Ct Angio Head W Or Wo Contrast  Result Date: 01/16/2018 CLINICAL DATA:  72 y/o F; dizziness with trouble balancing. History of cardiac catheterization 12/22/2017. EXAM: CT  ANGIOGRAPHY HEAD AND NECK TECHNIQUE: Multidetector CT imaging of the head and neck was performed using the standard protocol during bolus administration of intravenous contrast. Multiplanar CT image reconstructions and MIPs were obtained to evaluate the vascular anatomy. Carotid stenosis measurements (when applicable) are obtained utilizing NASCET criteria, using the distal internal carotid diameter as the denominator. CONTRAST:  160 cc Isovue 370 COMPARISON:  03/23/2017 CT head.  07/06/2010 MRI head. FINDINGS: CT HEAD FINDINGS Brain: No evidence of acute infarction, hemorrhage, hydrocephalus, extra-axial collection or mass lesion/mass effect. Stable small chronic lacunar infarct in the left thalamus. Stable chronic microvascular ischemic changes of white matter and parenchymal volume loss the brain. Vascular: As below. Skull: Normal. Negative for fracture or focal lesion. Sinuses: Imaged portions are clear. Orbits: No acute finding. Review of the MIP images confirms the above findings CTA NECK FINDINGS Aortic arch: Standard branching. Imaged portion shows no  evidence of aneurysm or dissection. Post CABG and median sternotomy. Mild calcific atherosclerosis. Calcific atherosclerosis of left subclavian artery origin, partially obscured by streak artifact, probable 50% moderate underlying stenosis. Right carotid system: No evidence of dissection, stenosis (50% or greater) or occlusion. Non stenotic calcified plaque of carotid bifurcation. Left carotid system: No evidence of dissection, stenosis (50% or greater) or occlusion. Vertebral arteries: Codominant. No evidence of dissection, stenosis (50% or greater) or occlusion. Calcific atherosclerosis of vertebral artery origins with mild less than 50% stenosis. Skeleton: C5-6 anterior cervical discectomy. No acute osseous abnormality. Other neck: Negative. Upper chest: Negative. Review of the MIP images confirms the above findings CTA HEAD FINDINGS Anterior circulation: Calcific atherosclerosis of bilateral carotid siphons with mild right paraclinoid and mild-to-moderate distal left cavernous stenosis. No high-grade stenosis, proximal occlusion, aneurysm, or vascular malformation. Posterior circulation: Calcific atherosclerosis of bilateral V4 segments with mild stenosis. No additional segment of significant stenosis, proximal occlusion, aneurysm, or vascular malformation. Venous sinuses: As permitted by contrast timing, patent. Anatomic variants: No posterior communicating artery identified, likely hypoplastic or absent. Patent anterior communicating artery. Delayed phase: No abnormal intracranial enhancement. Review of the MIP images confirms the above findings IMPRESSION: CT head: 1. No acute intracranial abnormality identified. 2. Stable chronic microvascular ischemic changes and parenchymal volume loss of the brain. CTA neck: 1. Patent carotid and vertebral arteries. No significant stenosis by NASCET criteria, dissection, or occlusion. 2. Calcified plaque bilateral vertebral artery origins with mild less than 50% stenosis.  3. Calcified plaque of left subclavian artery origin, partially obscured by streak artifact, approximately 50% mild-to-moderate stenosis. CTA head: 1. Patent anterior and posterior intracranial circulation. No high-grade stenosis, aneurysm, large vessel occlusion, or vascular malformation. 2. Multiple segments of mild stenosis in the anterior and posterior circulations as well as mild-to-moderate distal left cavernous ICA stenosis. Electronically Signed   By: Kristine Garbe M.D.   On: 01/16/2018 17:08   Ct Angio Neck W And/or Wo Contrast  Result Date: 01/16/2018 CLINICAL DATA:  72 y/o F; dizziness with trouble balancing. History of cardiac catheterization 12/22/2017. EXAM: CT ANGIOGRAPHY HEAD AND NECK TECHNIQUE: Multidetector CT imaging of the head and neck was performed using the standard protocol during bolus administration of intravenous contrast. Multiplanar CT image reconstructions and MIPs were obtained to evaluate the vascular anatomy. Carotid stenosis measurements (when applicable) are obtained utilizing NASCET criteria, using the distal internal carotid diameter as the denominator. CONTRAST:  160 cc Isovue 370 COMPARISON:  03/23/2017 CT head.  07/06/2010 MRI head. FINDINGS: CT HEAD FINDINGS Brain: No evidence of acute infarction, hemorrhage, hydrocephalus, extra-axial collection or mass lesion/mass  effect. Stable small chronic lacunar infarct in the left thalamus. Stable chronic microvascular ischemic changes of white matter and parenchymal volume loss the brain. Vascular: As below. Skull: Normal. Negative for fracture or focal lesion. Sinuses: Imaged portions are clear. Orbits: No acute finding. Review of the MIP images confirms the above findings CTA NECK FINDINGS Aortic arch: Standard branching. Imaged portion shows no evidence of aneurysm or dissection. Post CABG and median sternotomy. Mild calcific atherosclerosis. Calcific atherosclerosis of left subclavian artery origin, partially  obscured by streak artifact, probable 50% moderate underlying stenosis. Right carotid system: No evidence of dissection, stenosis (50% or greater) or occlusion. Non stenotic calcified plaque of carotid bifurcation. Left carotid system: No evidence of dissection, stenosis (50% or greater) or occlusion. Vertebral arteries: Codominant. No evidence of dissection, stenosis (50% or greater) or occlusion. Calcific atherosclerosis of vertebral artery origins with mild less than 50% stenosis. Skeleton: C5-6 anterior cervical discectomy. No acute osseous abnormality. Other neck: Negative. Upper chest: Negative. Review of the MIP images confirms the above findings CTA HEAD FINDINGS Anterior circulation: Calcific atherosclerosis of bilateral carotid siphons with mild right paraclinoid and mild-to-moderate distal left cavernous stenosis. No high-grade stenosis, proximal occlusion, aneurysm, or vascular malformation. Posterior circulation: Calcific atherosclerosis of bilateral V4 segments with mild stenosis. No additional segment of significant stenosis, proximal occlusion, aneurysm, or vascular malformation. Venous sinuses: As permitted by contrast timing, patent. Anatomic variants: No posterior communicating artery identified, likely hypoplastic or absent. Patent anterior communicating artery. Delayed phase: No abnormal intracranial enhancement. Review of the MIP images confirms the above findings IMPRESSION: CT head: 1. No acute intracranial abnormality identified. 2. Stable chronic microvascular ischemic changes and parenchymal volume loss of the brain. CTA neck: 1. Patent carotid and vertebral arteries. No significant stenosis by NASCET criteria, dissection, or occlusion. 2. Calcified plaque bilateral vertebral artery origins with mild less than 50% stenosis. 3. Calcified plaque of left subclavian artery origin, partially obscured by streak artifact, approximately 50% mild-to-moderate stenosis. CTA head: 1. Patent anterior  and posterior intracranial circulation. No high-grade stenosis, aneurysm, large vessel occlusion, or vascular malformation. 2. Multiple segments of mild stenosis in the anterior and posterior circulations as well as mild-to-moderate distal left cavernous ICA stenosis. Electronically Signed   By: Kristine Garbe M.D.   On: 01/16/2018 17:08   Mr Brain Wo Contrast  Result Date: 01/16/2018 CLINICAL DATA:  72 y/o  F; ataxia, stroke suspected. EXAM: MRI HEAD WITHOUT CONTRAST TECHNIQUE: Multiplanar, multiecho pulse sequences of the brain and surrounding structures were obtained without intravenous contrast. COMPARISON:  01/16/2018 CT angiogram of the head and neck. FINDINGS: Brain: Subcentimeter focus of reduced diffusion extending from the right paramedian midbrain to the right superior cerebellar peduncle (series 3, image 18-20). No associated hemorrhage or mass effect. Small chronic infarctions are present within the left cerebellar hemisphere, upper pons, right genu of internal capsule, and left thalamus. Patchy nonspecific foci of T2 FLAIR hyperintense signal abnormality in subcortical and periventricular white matter are compatible with moderate chronic microvascular ischemic changes for age. Moderate brain parenchymal volume loss. Vascular: Normal flow voids. Skull and upper cervical spine: Normal marrow signal. Sinuses/Orbits: Negative. Other: None. IMPRESSION: 1. Subcentimeter focus of acute/early subacute infarction extending from the right paramedian midbrain the right superior cerebellar peduncle. No associated hemorrhage or mass effect. 2. Small chronic infarctions are present within the left cerebellar hemisphere, upper pons, right genu of internal capsule, thalamus. 3. Moderate for age chronic microvascular ischemic changes and parenchymal volume loss of the brain. These results were  called by telephone at the time of interpretation on 01/16/2018 at 7:43 pm to Dr. Sherwood Gambler , who verbally  acknowledged these results. Electronically Signed   By: Kristine Garbe M.D.   On: 01/16/2018 19:45   Scheduled Meds: . aspirin  81 mg Oral Daily  . clopidogrel  75 mg Oral Daily  . enoxaparin (LOVENOX) injection  40 mg Subcutaneous Q24H  . furosemide  20 mg Oral Daily  . insulin aspart  0-9 Units Subcutaneous TID WC  . insulin aspart protamine- aspart  8 Units Subcutaneous BID WC  . metFORMIN  1,000 mg Oral BID WC  . pioglitazone  30 mg Oral Daily  . potassium chloride  10 mEq Oral Daily  . pravastatin  80 mg Oral QPM   Continuous Infusions:   LOS: 0 days   Geradine Girt, DO [facility]  If 7PM-7AM, please contact night-coverage [email or web address] Password [email or web address] 01/18/2018, 1:25 PM

## 2018-01-18 NOTE — Progress Notes (Addendum)
Occupational Therapy Evaluation Patient Details Name: Brenda Moreno MRN: [id number] DOB: 01-29-1946 Today's Date: 01/18/2018    History of Present Illness 72 y.o. female past medical history of coronary artery disease, diabetes, hypertension, hyperlipidemia, peripheral vascular disease, was in his usual state of health when she woke up at 5 AM on 01/16/2018 and found herself wobbly. MRI of the brain was done that revealed a subcentimeter focus of acute subacute infarction extending from the right paramedian midbrain into the right superior cerebellar peduncle with no mass-effect or hemorrhage.  Small chronic infarctions in the left cerebellum, pons, right change of internal capsule and thalamus.   Clinical Impression   PTA, pt lived with her visually impaired brother, who she helps provide care for, and was independent with ADL and mobility, including driving. Pt no longer complains of diplopia, however, demonstrates deficits with high level dynamic balance tasks. Recommend follow up with HHOT to facilitate return to PLOF and occupational performance. Educated pt on signs/symptoms of CVA using [employer]- pt verbalized understanding. All further OT to be addressed by [place].     Follow Up Recommendations  Home health OT;Supervision - Intermittent    Equipment Recommendations  3 in 1 bedside commode(to use as showr seat)    Recommendations for Other Services       Precautions / Restrictions Precautions Precautions: Fall      Mobility Bed Mobility Overal bed mobility: Modified Independent                Transfers Overall transfer level: Needs assistance   Transfers: Sit to/from Stand Sit to Stand: Supervision              Balance Overall balance assessment: Needs assistance   Sitting balance-Leahy Scale: Normal       Standing balance-Leahy Scale: Fair Standing balance comment: LOB to L with dynamic tasks                           ADL either performed or  assessed with clinical judgement   ADL Overall ADL's : Needs assistance/impaired     Grooming: Supervision/safety;Standing   Upper Body Bathing: Set up;Sitting   Lower Body Bathing: Supervison/ safety;Sit to/from stand   Upper Body Dressing : Set up;Sitting   Lower Body Dressing: Supervision/safety;Sit to/from stand   Toilet Transfer: Supervision/safety;Ambulation;Comfort height toilet   Toileting- Clothing Manipulation and Hygiene: Modified independent       Functional mobility during ADLs: Supervision/safety General ADL Comments: Educated on home safety and reducing risk of falls - recommend pt use 3in1 as shower chair; use reacher to retrieve items from floor; install grab bars in tub     Vision Baseline Vision/History: Wears glasses Wears Glasses: Distance only Patient Visual Report: Diplopia Vision Assessment?: Yes Eye Alignment: Within Functional Limits Alignment/Gaze Preference: Within Defined Limits Tracking/Visual Pursuits: Able to track stimulus in all quads without difficulty Saccades: Within functional limits Convergence: Within functional limits Visual Fields: No apparent deficits Additional Comments: Staes objects were side by side but diplopia has resloved at this time     Perception     Praxis      Pertinent Vitals/Pain Pain Assessment: No/denies pain     Hand Dominance Right   Extremity/Trunk Assessment Upper Extremity Assessment Upper Extremity Assessment: Overall WFL for tasks assessed   Lower Extremity Assessment Lower Extremity Assessment: Defer to PT evaluation   Cervical / Trunk Assessment Cervical / Trunk Assessment: Normal   Communication Communication Communication: No difficulties  Cognition Arousal/Alertness: Awake/alert Behavior During Therapy: WFL for tasks assessed/performed Overall Cognitive Status: Within Functional Limits for tasks assessed                                     General Comments        Exercises     Shoulder Instructions      Home Living Family/patient expects to be discharged to:: Private residence Living Arrangements: Other relatives Available Help at Discharge: Family;Available PRN/intermittently Type of Home: House Home Access: Stairs to enter [employer] of Steps: 3 Entrance Stairs-Rails: Right;Left Home Layout: One level     Bathroom Shower/Tub: Corporate investment banker: Standard Bathroom Accessibility: Yes How Accessible: Accessible via walker Home Equipment: None          Prior Functioning/Environment Level of Independence: Independent        Comments: drives; likes to read; caregiver for visually impaired brother        OT Problem List: Impaired balance (sitting and/or standing);Decreased safety awareness;Decreased knowledge of use of DME or AE      OT Treatment/Interventions:      OT Goals(Current goals can be found in the care plan section) Acute Rehab OT Goals Patient Stated Goal: to regain independence OT Goal Formulation: All assessment and education complete, DC therapy(continue with HHOT)  OT Frequency:     Barriers to D/C:            Co-evaluation              AM-PAC PT "6 Clicks" Daily Activity     Outcome Measure Help from another person eating meals?: None Help from another person taking care of personal grooming?: None Help from another person toileting, which includes using toliet, bedpan, or urinal?: A Little Help from another person bathing (including washing, rinsing, drying)?: A Little Help from another person to put on and taking off regular upper body clothing?: None Help from another person to put on and taking off regular lower body clothing?: A Little 6 Click Score: 21   End of Session Equipment Utilized During Treatment: Gait belt Nurse Communication: Mobility status;Other (comment)(DC needs)  Activity Tolerance: Patient tolerated treatment well Patient left: in  bed;with call bell/phone within reach;Other (comment)(MD with pt)  OT Visit Diagnosis: Unsteadiness on feet (R26.81);Dizziness and giddiness (R42)                Time: 2130-8657 OT Time Calculation (min): 28 min Charges:  OT General Charges $OT Visit: 1 Visit OT Evaluation $OT Eval Low Complexity: 1 Low OT Treatments $Self Care/Home Management : 8-22 mins G-Codes:     Maurie Boettcher, OT/L  OT Clinical Specialist [phone]   [facility] 01/18/2018, 9:30 AM

## 2018-01-19 ENCOUNTER — Encounter (HOSPITAL_COMMUNITY)
Admission: EM | Disposition: A | Discharge status: Home / Self Care | Payer: Self-pay | Source: Home / Self Care | Attending: Emergency Medicine

## 2018-01-19 ENCOUNTER — Encounter (HOSPITAL_COMMUNITY): Payer: Self-pay | Admitting: *Deleted

## 2018-01-19 ENCOUNTER — Observation Stay (HOSPITAL_BASED_OUTPATIENT_CLINIC_OR_DEPARTMENT_OTHER): Payer: Medicare Other

## 2018-01-19 DIAGNOSIS — I6302 Cerebral infarction due to thrombosis of basilar artery: Secondary | ICD-10-CM | POA: Diagnosis not present

## 2018-01-19 DIAGNOSIS — I6300 Cerebral infarction due to thrombosis of unspecified precerebral artery: Secondary | ICD-10-CM | POA: Diagnosis not present

## 2018-01-19 DIAGNOSIS — I340 Nonrheumatic mitral (valve) insufficiency: Secondary | ICD-10-CM

## 2018-01-19 DIAGNOSIS — E1159 Type 2 diabetes mellitus with other circulatory complications: Secondary | ICD-10-CM | POA: Diagnosis not present

## 2018-01-19 DIAGNOSIS — I639 Cerebral infarction, unspecified: Secondary | ICD-10-CM | POA: Diagnosis not present

## 2018-01-19 HISTORY — PX: TEE WITHOUT CARDIOVERSION: SHX5443

## 2018-01-19 LAB — GLUCOSE, CAPILLARY
GLUCOSE-CAPILLARY: 103 mg/dL — AB (ref 65–99)
Glucose-Capillary: 172 mg/dL — ABNORMAL HIGH (ref 65–99)
Glucose-Capillary: 54 mg/dL — ABNORMAL LOW (ref 65–99)
Glucose-Capillary: 116 mg/dL — ABNORMAL HIGH (ref 65–99)

## 2018-01-19 LAB — BASIC METABOLIC PANEL
Anion gap: 8 (ref 5–15)
BUN: 30 mg/dL — ABNORMAL HIGH (ref 6–20)
CO2: 26 mmol/L (ref 22–32)
Calcium: 8.9 mg/dL (ref 8.9–10.3)
Chloride: 106 mmol/L (ref 101–111)
Creatinine, Ser: 1.28 mg/dL — ABNORMAL HIGH (ref 0.44–1.00)
GFR calc non Af Amer: 42 mL/min — ABNORMAL LOW (ref >60)
GFR, EST AFRICAN AMERICAN: 48 mL/min — AB (ref >60)
Glucose, Bld: 179 mg/dL — ABNORMAL HIGH (ref 65–99)
Potassium: 4.2 mmol/L (ref 3.5–5.1)
Sodium: 140 mmol/L (ref 135–145)

## 2018-01-19 LAB — CBC
HCT: 37.2 % (ref 36.0–46.0)
HEMOGLOBIN: 12.2 g/dL (ref 12.0–15.0)
MCH: 26.5 pg (ref 26.0–34.0)
MCHC: 32.8 g/dL (ref 30.0–36.0)
MCV: 80.7 fL (ref 78.0–100.0)
Platelets: 184 10*3/uL (ref 150–400)
RBC: 4.61 MIL/uL (ref 3.87–5.11)
RDW: 16.4 % — ABNORMAL HIGH (ref 11.5–15.5)
WBC: 5.4 10*3/uL (ref 4.0–10.5)

## 2018-01-19 SURGERY — ECHOCARDIOGRAM, TRANSESOPHAGEAL
Anesthesia: Moderate Sedation

## 2018-01-19 MED ORDER — ASPIRIN EC 81 MG PO TBEC: 81.0000 mg | DELAYED_RELEASE_TABLET | Freq: Every day | ORAL | 0 refills | Status: AC

## 2018-01-19 MED ORDER — SODIUM CHLORIDE 0.9 % IV SOLN: INTRAVENOUS | Status: DC

## 2018-01-19 MED ORDER — DEXTROSE 50 % IV SOLN
INTRAVENOUS | Status: AC
  Filled 2018-01-19: qty 50

## 2018-01-19 MED ORDER — MIDAZOLAM HCL 10 MG/2ML IJ SOLN
INTRAMUSCULAR | Status: DC | PRN
  Administered 2018-01-19: 2 mg via INTRAVENOUS
  Administered 2018-01-19: 1 mg via INTRAVENOUS
  Administered 2018-01-19 (×2): 2 mg via INTRAVENOUS

## 2018-01-19 MED ORDER — MIDAZOLAM HCL 5 MG/ML IJ SOLN
INTRAMUSCULAR | Status: AC
  Filled 2018-01-19: qty 2

## 2018-01-19 MED ORDER — FENTANYL CITRATE (PF) 100 MCG/2ML IJ SOLN
INTRAMUSCULAR | Status: DC | PRN
  Administered 2018-01-19 (×2): 25 ug via INTRAVENOUS

## 2018-01-19 MED ORDER — CLOPIDOGREL BISULFATE 75 MG PO TABS: 75.0000 mg | ORAL_TABLET | Freq: Every day | ORAL | 0 refills | Status: DC

## 2018-01-19 MED ORDER — CALCIUM CARBONATE ANTACID 500 MG PO CHEW: 1.0000 | CHEWABLE_TABLET | Freq: Two times a day (BID) | ORAL | Status: DC | PRN

## 2018-01-19 MED ORDER — FENTANYL CITRATE (PF) 100 MCG/2ML IJ SOLN
INTRAMUSCULAR | Status: AC
  Filled 2018-01-19: qty 2

## 2018-01-19 MED ORDER — BUTAMBEN-TETRACAINE-BENZOCAINE 2-2-14 % EX AERO
INHALATION_SPRAY | CUTANEOUS | Status: DC | PRN
  Administered 2018-01-19: 2 via TOPICAL

## 2018-01-19 MED ORDER — DIPHENHYDRAMINE HCL 50 MG/ML IJ SOLN
INTRAMUSCULAR | Status: AC
  Filled 2018-01-19: qty 1

## 2018-01-19 NOTE — CV Procedure (Signed)
TEE During this procedure the patient is administered a total of Versed 7 mg and Fentanyl 50 mg to achieve and maintain moderate conscious sedation.  The patient's heart rate, blood pressure, and oxygen saturation are monitored continuously during the procedure. The period of conscious sedation is 30 minutes, of which I was present face-to-face 100% of this time.  EF 60% No LAA thrombus and no LA mass Mild to moderate MR AV sclerosis Normal aortic root with somewhat bulky calcific atherosclerosis in the arch Normal RV Negative bubble study No PFO No effusion   Jenkins Rouge

## 2018-01-19 NOTE — Plan of Care (Signed)
Adls improving

## 2018-01-19 NOTE — Progress Notes (Addendum)
Physician Discharge Summary  Brenda Moreno KDX:[id number] DOB: [birth year] DOA: 01/16/2018  PCP: Brenda Frizzle, MD  Admit date: 01/16/2018 Discharge date: 01/19/2018  Time spent: 40 minutes  Recommendations for Outpatient Follow-up:  1. Follow up CBC/CMP outpatient (attention to creatinine, fluctuating recently and restarting her BP meds) 2. Ensure follow up with neurology as outpatient 3. Follow blood pressure.  Pt to resume BP meds as outpatient, but to hold metoprolol due to bradycardia seen during hospitalization (consider restarting at lower dose as outpatient) 4. Aspirin/plavix x 21 days, then plavix only 5. Continue to optimize treatment of diabetes, HTN, HLD.   6. Continue to pursue prior auth of livalo as outpatient.  Consider PSCK-9 inhibitor.   Discharge Diagnoses:  Principal Problem:   CVA (cerebral vascular accident) ([place]) Active Problems:   Diabetes mellitus ([place])   HYPERCHOLESTEROLEMIA   Benign essential HTN   Peripheral vascular disease, unspecified ([place])   Chronic diastolic heart failure ([place])   Myocardial infarction [facility])  Discharge Condition: stable  Diet recommendation: heart healty  Filed Weights   01/16/18 1305 01/17/18 2056 01/19/18 1318  Weight: 83.9 kg (185 lb) 89.9 kg (198 lb 3.1 oz) 89.8 kg (198 lb)    History of present illness:  Per previous PN Brenda Moreno an obese 72 y.o.femalewith medical history significantfor DM2, HTN, Diastolic CHF, CABG, PVD,who presented to the ED with complaints of unstable gait that started at about 5 AM this morning when she got up to use the bathroom.Patient reported she felt like she was moving sideways. Shedenies lightheadedness or sensation of room spinning.She has also endorses double vision that started at about the same time,that improves when she closes one eye.   MRI brain done without contrast showed Brenda Moreno subcentimeter focusofacute/early subacute infarction extending from right paramedian  midbrain to the right superior cerebellar peduncle.Neurology was consulted- Brenda Moreno,recommended admission to [facility] hospitalist service forCVA workup.  Stroke work up notable for echo with possible LA mass.  TEE was obtained which did not identify Brenda Moreno cardiac source of emboli (mass on previous image was thought to be artifact).  Tele sinus brady.  CTA head and neck with calcified plaque bilateral verterbral arteries with mild less than 50% stenosis, calcified plaque of L subclavian artery origin with approximately 50% mild to moderate stenosis.  No high grade stenosis, aneurysm, or large vessel occlusion or vascular malformation.  Multiple segments of mild stenosis in anterior and posterior circulations as well as mild to moderate distal L cavernous ICA stenosis.  MRI with subcentimeter focus of acute/early subacute infaction from R paramedian midbrain to R superior cerebellar peduncle and small chronic infarctions within L cerebellar hemisphere, upper pons, R genu of internal capsule, and thalamus (see reports).  Seen by neurology who recommended ASA and plavix x3 weeks, then plavix alone.  Recommended pravachol and follow up to try to get livalo given myalgias. See by speech who did not recommend further SLP services.  PT and OT recommended home health.    Hospital Course:  Acute/Subacute CVA with Symptoms of Unsteady gait. -Not given tPA because outside of window  -CTA Head and Neck done wo stenosis -MRI brain subcentimeter focus of acute/early subacute infarction extending from right paramedian midbrain to rightsuperior cerebellar peduncle,chronic small infarctions. Small chronic infarctions are present within the left cerebellar nhemisphere, upper pons, right genu of internal capsule, thalamus.  -Patient was complaint with aspirin 81mg  daily. -Patient has not tolerated statins Lipitor or Crestor in the past-reported lower extremities"giving way".Was restarted on  pravastatin -- resume  until other medications can be given (follow up with PCP for livalo or consider PSCK9 inhibitor)  -ASA/plavix x 3 weeks then plavix alone -Will restart antihypertensives -Transthoracic Echocardiogram: appears to have an LA Mass-- TEE was negative for this -LDL 95 -Hgba1c done recently on  12/22/2017- 8.9 -PT/OT-- home health - Follow up with neuro in 4 weeks  HTN - will resume home BP meds at discharge, BP fluctuating here, but notably hypertensive in early afternoon  Bradycardia:  - continue to hold metoprolol given bradycardia - consider restarting outpatient at lower dose   CAD status post CABG -She had Brenda Moreno recent cardiac catheterization on 12/22/2017 which showed severe triple-vessel CAD status post 6 vessel CABG with 5 out of 6 pain grafts.  Per cardiology there is no focal targets for PCI  Chronic Diastolic CHF -Appears euvolemic, no chest pain, recent ECHO - 12/23/17-Ef- 60-65%, no RWMA, high ventricul;ar filling pressure- Mod pulm HTN. -Continue home Lasix 20 mg daily -Resume BP meds, but hold metoprolol with bradycardia  Uncontrolled Insulin Dependent Diabetes Mellitus Type 2 -Last Hgba1c- 9.9.  -Currently receiving 8 units of NovoLog 70/30 mix twice daily with meals.  She takes Novolin 70/30 15 units with breakfast and 7 units with supper at home. -hold metformin as she had Brenda Moreno CTA -SSI  CKD Stage III -recheck in AM  Procedures: TEE Study Conclusions  - Left ventricle: Systolic function was vigorous. The estimated   ejection fraction was in the range of 65% to 70%. - Mitral valve: There was mild to moderate regurgitation. - Left atrium: No LA mass noted Images on TTE PSL axis would appear   to be artifact. The atrium was moderately dilated. No evidence of   thrombus in the atrial cavity or appendage. - Right atrium: No evidence of thrombus in the atrial cavity or   appendage. - Atrial septum: No defect or patent foramen ovale was identified.   Echo contrast  study showed no right-to-left atrial level shunt,   at baseline or with provocation. - Impressions: TEE done in endoscopy with moderate sedation 7 mg   versed and 50 ug fentanyl Time 30 mintues  Impressions:  - TEE done in endoscopy with moderate sedation 7 mg versed and 50   ug fentanyl Time 30 mintues No cardiac source of emboli was   indentified.  TTE Study Conclusions  - Left ventricle: The cavity size was normal. Systolic function was   normal. The estimated ejection fraction was in the range of 60%   to 65%. Wall motion was normal; there were no regional wall   motion abnormalities. The study is not technically sufficient to   allow evaluation of LV diastolic function. - Aortic valve: Severe focal calcification involving the   noncoronary cusp. - Mitral valve: There was trivial regurgitation. - Left atrium: There is Brenda Moreno 3.2 x 1.07 oblong density in the LA that   could represent thrombus. Recommend TEE for further delineation. - Atrial septum: There was increased thickness of the septum,   consistent with lipomatous hypertrophy. - Pulmonic valve: There was trivial regurgitation. - Pulmonary arteries: PA peak pressure: 39 mm Hg (S).  Impressions:  - Possible LA mass although only seen in the parasternal long axis   view. Recommend TEE for further delineation. The right   ventricular systolic pressure was increased consistent with mild   pulmonary hypertension.  Consultations:  neurology  Discharge Exam: Vitals:   01/19/18 1440 01/19/18 1450  BP: (!) 102/44 (!) 107/38  Pulse: (!) 53 (!) 52  Resp: (!) 24 (!) 22  Temp:    SpO2: 95% 94%   Ready to go home.  General: No acute distress. Cardiovascular: Heart sounds show Brenda Moreno regular rate, and rhythm. No gallops or rubs. No murmurs. No JVD. Lungs: Clear to auscultation bilaterally with good air movement. No rales, rhonchi or wheezes. Abdomen: Soft, nontender, nondistended with normal active bowel sounds. No masses.  No hepatosplenomegaly. Neurological: Alert and oriented 3. Moves all extremities 4. Cranial nerves II through XII grossly intact. (my exam grossly normal, but neuro noted LUE and LLE 4+/5 strength as well as Decreased FMM L hand. R hand orbits L) Skin: Warm and dry. No rashes or lesions. Extremities: No clubbing or cyanosis. No edema. Pedal pulses 2+. Psychiatric: Mood and affect are normal. Insight and judgment are appropraite.  Discharge Instructions   Discharge Instructions    Ambulatory referral to Neurology   Complete by:  As directed    Follow up with stroke clinic NP (Jessica Vanschaick or Cecille Rubin, if both not available, consider Brenda. Antony Contras, Brenda. Bess Harvest, or Brenda. Sarina Ill) at [facility] in about 4 weeks.   Call MD for:  difficulty breathing, headache or visual disturbances   Complete by:  As directed    Call MD for:  persistant dizziness or light-headedness   Complete by:  As directed    Call MD for:  persistant nausea and vomiting   Complete by:  As directed    Call MD for:  severe uncontrolled pain   Complete by:  As directed    Call MD for:  temperature >100.4   Complete by:  As directed    Diet - low sodium heart healthy   Complete by:  As directed    Discharge instructions   Complete by:  As directed    You were seen for Brenda Moreno stroke.  Please follow up with neurology in about 4 weeks.  You will take aspirin 81 mg daily for the next 21 days.  After this, stop the aspirin.    Start taking plavix daily.    Continue your pravastatin 80 mg daily.   We restarted your lasix in the hospital.  Restart your amlodipine and your quinapril when you get home (tomorrow). Your heart rate was Brenda Moreno little slow, so please continue to hold this (do not start unless directed to by your PCP).  Blood pressure and diabetes control as well as compliance with your medications will be important to prevent future strokes.   Please follow up with your PCP  early next week.  Follow up with neurology in about 4 weeks.  Return if you have new, recurrent, or worsening symptoms.  Please ask your PCP to request records from this hospitalization so they know what was done and what the next steps will be.   Increase activity slowly   Complete by:  As directed      Allergies as of 01/19/2018      Reactions   Levaquin [levofloxacin In D5w] Other (See Comments)   Pain all over and in joints   Statins Other (See Comments)   Severe myalgias to lipitor, crestor, pravastatin and weakness and cramping  in bilateral leg.   Tradjenta [linagliptin] Itching      Medication List    STOP taking these medications   metoprolol succinate 100 MG 24 hr tablet Commonly known as:  TOPROL-XL     TAKE these medications   amLODipine 5 MG  tablet Commonly known as:  NORVASC TAKE 1 TABLET BY MOUTH  DAILY   aspirin EC 81 MG tablet Take 1 tablet (81 mg total) by mouth daily for 21 days. (stop after 21 days) What changed:  additional instructions   CINNAMON PO Take 1,000 mg by mouth 2 (two) times daily.   clopidogrel 75 MG tablet Commonly known as:  PLAVIX Take 1 tablet (75 mg total) by mouth daily.   CO Q 10 PO Take 100 mg by mouth daily.   fluticasone 50 MCG/ACT nasal spray Commonly known as:  FLONASE Place 2 sprays into both nostrils daily as needed for allergies.   furosemide 40 MG tablet Commonly known as:  LASIX Take 20 mg by mouth daily. Patient taking 0.5 tablets po qd.   Insulin Isophane & Regular Human (70-30) 100 UNIT/ML PEN Commonly known as:  HUMULIN 70/30 KWIKPEN 15 units with breakfast and 7 units with supper   metFORMIN 1000 MG tablet Commonly known as:  GLUCOPHAGE TAKE 1 TABLET BY MOUTH  TWICE Brenda Moreno DAY WITH MEALS   nitroGLYCERIN 0.4 MG SL tablet Commonly known as:  NITROSTAT Place 1 tablet (0.4 mg total) under the tongue every 5 (five) minutes as needed for chest pain.   pioglitazone 30 MG tablet Commonly known as:  ACTOS Take  30 mg by mouth as directed.   potassium chloride 10 MEQ tablet Commonly known as:  K-DUR Take 1 tablet (10 mEq total) by mouth daily.   pravastatin 80 MG tablet Commonly known as:  [name] Take 1 tablet (80 mg total) by mouth every evening.   quinapril 20 MG tablet Commonly known as:  ACCUPRIL Take 20 mg by mouth 2 (two) times daily.   traMADol 50 MG tablet Commonly known as:  ULTRAM Take 1 tablet (50 mg total) by mouth every 8 (eight) hours as needed.            Durable Medical Equipment  (From admission, onward)        Start     Ordered   01/19/18 1546  DME 3-in-1  Once     01/19/18 1558   01/19/18 1546  For home use only DME Walker rolling  [facility])  Once    Question:  Patient needs Brenda Moreno walker to treat with the following condition  Answer:  Gait abnormality   01/19/18 1558   01/19/18 1131  For home use only DME 3 n 1  Once     01/19/18 1130   01/19/18 1131  For home use only DME Walker rolling  Once    Question:  Patient needs Brenda Moreno walker to treat with the following condition  Answer:  Stroke ([place])   01/19/18 1130     Allergies  Allergen Reactions  . Levaquin [Levofloxacin In D5w] Other (See Comments)    Pain all over and in joints  . Statins Other (See Comments)    Severe myalgias to lipitor, crestor, pravastatin and weakness and cramping  in bilateral leg.  Brenda Moreno [Linagliptin] Itching   Follow-up Information    [facility] Follow up in 4 week(s).   Specialty:  Neurology Why:  stroke clinic. office will call with appt date and time Contact information: [address] [place] [phone]       Brenda Frizzle, MD Follow up.   Specialty:  Family Medicine Contact information: [address] [place] [address] [phone]        Nahser, Wonda Cheng,  MD .   Specialty:  Cardiology Contact information: [place] [address] [phone]             The results of significant diagnostics from this hospitalization (including imaging, microbiology, ancillary and laboratory) are listed below for reference.    Significant Diagnostic Studies: Ct Angio Head W Or Wo Contrast  Result Date: 01/16/2018 CLINICAL DATA:  72 y/o F; dizziness with trouble balancing. History of cardiac catheterization 12/22/2017. EXAM: CT ANGIOGRAPHY HEAD AND NECK TECHNIQUE: Multidetector CT imaging of the head and neck was performed using the standard protocol during bolus administration of intravenous contrast. Multiplanar CT image reconstructions and MIPs were obtained to evaluate the vascular anatomy. Carotid stenosis measurements (when applicable) are obtained utilizing NASCET criteria, using the distal internal carotid diameter as the denominator. CONTRAST:  160 cc Isovue 370 COMPARISON:  03/23/2017 CT head.  07/06/2010 MRI head. FINDINGS: CT HEAD FINDINGS Brain: No evidence of acute infarction, hemorrhage, hydrocephalus, extra-axial collection or mass lesion/mass effect. Stable small chronic lacunar infarct in the left thalamus. Stable chronic microvascular ischemic changes of white matter and parenchymal volume loss the brain. Vascular: As below. Skull: Normal. Negative for fracture or focal lesion. Sinuses: Imaged portions are clear. Orbits: No acute finding. Review of the MIP images confirms the above findings CTA NECK FINDINGS Aortic arch: Standard branching. Imaged portion shows no evidence of aneurysm or dissection. Post CABG and median sternotomy. Mild calcific atherosclerosis. Calcific atherosclerosis of left subclavian artery origin, partially obscured by streak artifact, probable 50% moderate underlying stenosis. Right carotid system: No evidence of dissection, stenosis (50% or greater) or occlusion. Non stenotic calcified plaque of carotid bifurcation. Left carotid system: No evidence of dissection, stenosis (50% or greater) or occlusion. Vertebral arteries:  Codominant. No evidence of dissection, stenosis (50% or greater) or occlusion. Calcific atherosclerosis of vertebral artery origins with mild less than 50% stenosis. Skeleton: C5-6 anterior cervical discectomy. No acute osseous abnormality. Other neck: Negative. Upper chest: Negative. Review of the MIP images confirms the above findings CTA HEAD FINDINGS Anterior circulation: Calcific atherosclerosis of bilateral carotid siphons with mild right paraclinoid and mild-to-moderate distal left cavernous stenosis. No high-grade stenosis, proximal occlusion, aneurysm, or vascular malformation. Posterior circulation: Calcific atherosclerosis of bilateral V4 segments with mild stenosis. No additional segment of significant stenosis, proximal occlusion, aneurysm, or vascular malformation. Venous sinuses: As permitted by contrast timing, patent. Anatomic variants: No posterior communicating artery identified, likely hypoplastic or absent. Patent anterior communicating artery. Delayed phase: No abnormal intracranial enhancement. Review of the MIP images confirms the above findings IMPRESSION: CT head: 1. No acute intracranial abnormality identified. 2. Stable chronic microvascular ischemic changes and parenchymal volume loss of the brain. CTA neck: 1. Patent carotid and vertebral arteries. No significant stenosis by NASCET criteria, dissection, or occlusion. 2. Calcified plaque bilateral vertebral artery origins with mild less than 50% stenosis. 3. Calcified plaque of left subclavian artery origin, partially obscured by streak artifact, approximately 50% mild-to-moderate stenosis. CTA head: 1. Patent anterior and posterior intracranial circulation. No high-grade stenosis, aneurysm, large vessel occlusion, or vascular malformation. 2. Multiple segments of mild stenosis in the anterior and posterior circulations as well as mild-to-moderate distal left cavernous ICA stenosis. Electronically Signed   By: Kristine Garbe  M.D.   On: 01/16/2018 17:08   Dg Chest 2 View  Result Date: 12/21/2017 CLINICAL DATA:  Chest pain. EXAM: CHEST - 2 VIEW COMPARISON:  Radiographs of October 20, 2016. FINDINGS: The heart size and mediastinal contours are  within normal limits. Both lungs are clear. Status post coronary artery bypass graft. No pneumothorax or pleural effusion is noted. The visualized skeletal structures are unremarkable. IMPRESSION: No active cardiopulmonary disease. Electronically Signed   By: Marijo Conception, M.D.   On: 12/21/2017 15:26   Ct Angio Neck W And/or Wo Contrast  Result Date: 01/16/2018 CLINICAL DATA:  72 y/o F; dizziness with trouble balancing. History of cardiac catheterization 12/22/2017. EXAM: CT ANGIOGRAPHY HEAD AND NECK TECHNIQUE: Multidetector CT imaging of the head and neck was performed using the standard protocol during bolus administration of intravenous contrast. Multiplanar CT image reconstructions and MIPs were obtained to evaluate the vascular anatomy. Carotid stenosis measurements (when applicable) are obtained utilizing NASCET criteria, using the distal internal carotid diameter as the denominator. CONTRAST:  160 cc Isovue 370 COMPARISON:  03/23/2017 CT head.  07/06/2010 MRI head. FINDINGS: CT HEAD FINDINGS Brain: No evidence of acute infarction, hemorrhage, hydrocephalus, extra-axial collection or mass lesion/mass effect. Stable small chronic lacunar infarct in the left thalamus. Stable chronic microvascular ischemic changes of white matter and parenchymal volume loss the brain. Vascular: As below. Skull: Normal. Negative for fracture or focal lesion. Sinuses: Imaged portions are clear. Orbits: No acute finding. Review of the MIP images confirms the above findings CTA NECK FINDINGS Aortic arch: Standard branching. Imaged portion shows no evidence of aneurysm or dissection. Post CABG and median sternotomy. Mild calcific atherosclerosis. Calcific atherosclerosis of left subclavian artery origin,  partially obscured by streak artifact, probable 50% moderate underlying stenosis. Right carotid system: No evidence of dissection, stenosis (50% or greater) or occlusion. Non stenotic calcified plaque of carotid bifurcation. Left carotid system: No evidence of dissection, stenosis (50% or greater) or occlusion. Vertebral arteries: Codominant. No evidence of dissection, stenosis (50% or greater) or occlusion. Calcific atherosclerosis of vertebral artery origins with mild less than 50% stenosis. Skeleton: C5-6 anterior cervical discectomy. No acute osseous abnormality. Other neck: Negative. Upper chest: Negative. Review of the MIP images confirms the above findings CTA HEAD FINDINGS Anterior circulation: Calcific atherosclerosis of bilateral carotid siphons with mild right paraclinoid and mild-to-moderate distal left cavernous stenosis. No high-grade stenosis, proximal occlusion, aneurysm, or vascular malformation. Posterior circulation: Calcific atherosclerosis of bilateral V4 segments with mild stenosis. No additional segment of significant stenosis, proximal occlusion, aneurysm, or vascular malformation. Venous sinuses: As permitted by contrast timing, patent. Anatomic variants: No posterior communicating artery identified, likely hypoplastic or absent. Patent anterior communicating artery. Delayed phase: No abnormal intracranial enhancement. Review of the MIP images confirms the above findings IMPRESSION: CT head: 1. No acute intracranial abnormality identified. 2. Stable chronic microvascular ischemic changes and parenchymal volume loss of the brain. CTA neck: 1. Patent carotid and vertebral arteries. No significant stenosis by NASCET criteria, dissection, or occlusion. 2. Calcified plaque bilateral vertebral artery origins with mild less than 50% stenosis. 3. Calcified plaque of left subclavian artery origin, partially obscured by streak artifact, approximately 50% mild-to-moderate stenosis. CTA head: 1. Patent  anterior and posterior intracranial circulation. No high-grade stenosis, aneurysm, large vessel occlusion, or vascular malformation. 2. Multiple segments of mild stenosis in the anterior and posterior circulations as well as mild-to-moderate distal left cavernous ICA stenosis. Electronically Signed   By: Kristine Garbe M.D.   On: 01/16/2018 17:08   Mr Brain Wo Contrast  Result Date: 01/16/2018 CLINICAL DATA:  72 y/o  F; ataxia, stroke suspected. EXAM: MRI HEAD WITHOUT CONTRAST TECHNIQUE: Multiplanar, multiecho pulse sequences of the brain and surrounding structures were obtained without intravenous contrast. COMPARISON:  01/16/2018 CT angiogram of the head and neck. FINDINGS: Brain: Subcentimeter focus of reduced diffusion extending from the right paramedian midbrain to the right superior cerebellar peduncle (series 3, image 18-20). No associated hemorrhage or mass effect. Small chronic infarctions are present within the left cerebellar hemisphere, upper pons, right genu of internal capsule, and left thalamus. Patchy nonspecific foci of T2 FLAIR hyperintense signal abnormality in subcortical and periventricular white matter are compatible with moderate chronic microvascular ischemic changes for age. Moderate brain parenchymal volume loss. Vascular: Normal flow voids. Skull and upper cervical spine: Normal marrow signal. Sinuses/Orbits: Negative. Other: None. IMPRESSION: 1. Subcentimeter focus of acute/early subacute infarction extending from the right paramedian midbrain the right superior cerebellar peduncle. No associated hemorrhage or mass effect. 2. Small chronic infarctions are present within the left cerebellar hemisphere, upper pons, right genu of internal capsule, thalamus. 3. Moderate for age chronic microvascular ischemic changes and parenchymal volume loss of the brain. These results were called by telephone at the time of interpretation on 01/16/2018 at 7:43 pm to Brenda. Sherwood Gambler , who  verbally acknowledged these results. Electronically Signed   By: Kristine Garbe M.D.   On: 01/16/2018 19:45    Microbiology: No results found for this or any previous visit (from the past 240 hour(s)).   Labs: Basic Metabolic Panel: Recent Labs  Lab 01/16/18 1319 01/16/18 1327 01/17/18 0909 01/19/18 0836  NA 142 143 140 140  K 4.2 4.2 4.2 4.2  CL 108 107 107 106  CO2 24  --  25 26  GLUCOSE 193* 183* 306* 179*  BUN 25* 24* 29* 30*  CREATININE 1.20* 1.10* 1.31* 1.28*  CALCIUM 9.4  --  8.8* 8.9  MG  --   --  1.9  --   PHOS  --   --  3.7  --    Liver Function Tests: Recent Labs  Lab 01/16/18 1319 01/17/18 0909  AST 18 16  ALT 17 15  ALKPHOS 82 75  BILITOT 0.4 0.4  PROT 7.7 6.1*  ALBUMIN 4.0 3.2*   No results for input(s): LIPASE, AMYLASE in the last 168 hours. No results for input(s): AMMONIA in the last 168 hours. CBC: Recent Labs  Lab 01/16/18 1319 01/16/18 1327 01/17/18 0909 01/19/18 0836  WBC 6.1  --  6.2 5.4  NEUTROABS 3.3  --  3.2  --   HGB 13.8 14.6 12.0 12.2  HCT 40.9 43.0 35.4* 37.2  MCV 82.1  --  80.5 80.7  PLT 198  --  191 184   Cardiac Enzymes: No results for input(s): CKTOTAL, CKMB, CKMBINDEX, TROPONINI in the last 168 hours. BNP: BNP (last 3 results) No results for input(s): BNP in the last 8760 hours.  ProBNP (last 3 results) No results for input(s): PROBNP in the last 8760 hours.  CBG: Recent Labs  Lab 01/18/18 2133 01/19/18 0630 01/19/18 1137 01/19/18 1314 01/19/18 1540  GLUCAP 140* 172* 54* 116* 103*       Signed:  Fayrene Helper MD.  [facility] 01/19/2018, 3:59 PM

## 2018-01-19 NOTE — Progress Notes (Signed)
Physical Therapy Treatment Patient Details Name: Brenda Moreno MRN: [id number] DOB: 11/11/1945 Today's Date: 01/19/2018    History of Present Illness Pt is a 72 y.o. female with PMH of CAD, DM, HTN, PVD, admitted 01/16/18 with c/o "feeling wobbly." MIR revealed acute/subacute infarction extending from R paramedian midbrain into R superior cerebellar peduncle with no mass-effect or hemorrhage; small chronic infarctions in L cerebellum, pons, with R change of internal capsule and thalamus.    PT Comments    Patient ambulating unit without path deviation, utilized RW due to fatigue as pt just returned from TEE and hasn't eaten. Ambulating well, no overt LOB with conversations and head turns during session. Pt safe to return home when medically cleared, will do well with HHPT.    Follow Up Recommendations  Home health PT;Supervision - Intermittent     Equipment Recommendations  Rolling walker with 5" wheels    Recommendations for Other Services       Precautions / Restrictions Precautions Precautions: Fall Restrictions Weight Bearing Restrictions: No    Mobility  Bed Mobility Overal bed mobility: Independent                Transfers Overall transfer level: Needs assistance Equipment used: None;Rolling walker (2 wheeled) Transfers: Sit to/from Stand Sit to Stand: Supervision            Ambulation/Gait Ambulation/Gait assistance: Supervision;Min guard Ambulation Distance (Feet): 500 Feet Assistive device: Rolling walker (2 wheeled);None Gait Pattern/deviations: Step-through pattern;Decreased stride length;Drifts right/left Gait velocity: decrased   General Gait Details: Patient hasn't eaten all day and just got out of TEE so utilzied RW. patient with no LOB or L path deviation this visit. worked with head turns during ambulation. patient supervision level    Stairs             Wheelchair Mobility    Modified Rankin (Stroke Patients Only)        Balance Overall balance assessment: Needs assistance   Sitting balance-Leahy Scale: Normal       Standing balance-Leahy Scale: Good                              Cognition Arousal/Alertness: Awake/alert Behavior During Therapy: WFL for tasks assessed/performed Overall Cognitive Status: Within Functional Limits for tasks assessed                                        Exercises      General Comments        Pertinent Vitals/Pain Pain Assessment: No/denies pain    Home Living                      Prior Function            PT Goals (current goals can now be found in the care plan section) Acute Rehab PT Goals Patient Stated Goal: to regain independence PT Goal Formulation: With patient Time For Goal Achievement: 02/01/18 Potential to Achieve Goals: Good    Frequency    Min 4X/week      PT Plan Current plan remains appropriate    Co-evaluation              AM-PAC PT "6 Clicks" Daily Activity  Outcome Measure  Difficulty turning over in bed (including adjusting bedclothes, sheets and blankets)?: None Difficulty moving from  lying on back to sitting on the side of the bed? : None Difficulty sitting down on and standing up from a chair with arms (e.g., wheelchair, bedside commode, etc,.)?: A Little Help needed moving to and from a bed to chair (including a wheelchair)?: A Little Help needed walking in hospital room?: A Little Help needed climbing 3-5 steps with a railing? : A Little 6 Click Score: 20    End of Session Equipment Utilized During Treatment: Gait belt Activity Tolerance: Patient tolerated treatment well Patient left: in bed;with call bell/phone within reach Nurse Communication: Mobility status PT Visit Diagnosis: Other abnormalities of gait and mobility (R26.89)     Time: 6948-5462 PT Time Calculation (min) (ACUTE ONLY): 17 min  Charges:  $Gait Training: 8-22 mins                    G Codes:        Reinaldo Berber, PT, DPT Acute Rehab Services Pager: [phone]     Reinaldo Berber 01/19/2018, 4:23 PM

## 2018-01-19 NOTE — Progress Notes (Signed)
Physical Therapy Cancellation Note   01/19/18 1303  PT Visit Information  Last PT Received On 01/19/18  Reason Eval/Treat Not Completed Patient at procedure or test/unavailable. PT will continue to follow acutely.    Earney Navy, PTA Pager: [phone]

## 2018-01-19 NOTE — Interval H&P Note (Signed)
History and Physical Interval Note:  01/19/2018 1:46 PM  Brenda Moreno  has presented today for surgery, with the diagnosis of STROKE  The various methods of treatment have been discussed with the patient and family. After consideration of risks, benefits and other options for treatment, the patient has consented to  Procedure(s): TRANSESOPHAGEAL ECHOCARDIOGRAM (TEE) (N/A) as a surgical intervention .  The patient's history has been reviewed, patient examined, no change in status, stable for surgery.  I have reviewed the patient's chart and labs.  Questions were answered to the patient's satisfaction.     Jenkins Rouge

## 2018-01-19 NOTE — Progress Notes (Signed)
    [facility] has been requested to perform a transesophageal echocardiogram on Brenda Moreno for stroke.  After careful review of history and examination, the risks and benefits of transesophageal echocardiogram have been explained including risks of esophageal damage, perforation (1:10,000 risk), bleeding, pharyngeal hematoma as well as other potential complications associated with conscious sedation including aspiration, arrhythmia, respiratory failure and death. Alternatives to treatment were discussed, questions were answered. Patient is willing to proceed.   Her procedure is scheduled for 3:00 this afternoon.   Daune Perch, NP  01/19/2018 10:07 AM

## 2018-01-19 NOTE — Progress Notes (Signed)
  Echocardiogram Echocardiogram Transesophageal has been performed.  Johny Chess 01/19/2018, 2:34 PM

## 2018-01-19 NOTE — Progress Notes (Addendum)
STROKE TEAM PROGRESS NOTE   SUBJECTIVE (INTERVAL HISTORY) Family bedside. Pt reported some dizziness after being up for a while doing am care. For TEE today. If insignificant, anticipate d/c later today.   OBJECTIVE Vitals:   01/19/18 0427 01/19/18 0737 01/19/18 1224 01/19/18 1318  BP: (!) 151/60 (!) 150/58 (!) 153/74 (!) 182/59  Pulse: (!) 56 (!) 56 (!) 54   Resp: 18 17 18  (!) 21  Temp: 97.8 F (36.6 C) 97.7 F (36.5 C) 98 F (36.7 C) (!) 97.4 F (36.3 C)  TempSrc: Oral Oral Oral Oral  SpO2: 98% 97% 98% 98%  Weight:    89.8 kg (198 lb)  Height:    5\' 4"  (1.626 m)    CBC:  Recent Labs  Lab 01/16/18 1319  01/17/18 0909 01/19/18 0836  WBC 6.1  --  6.2 5.4  NEUTROABS 3.3  --  3.2  --   HGB 13.8   < > 12.0 12.2  HCT 40.9   < > 35.4* 37.2  MCV 82.1  --  80.5 80.7  PLT 198  --  191 184   < > = values in this interval not displayed.    Basic Metabolic Panel:  Recent Labs  Lab 01/17/18 0909 01/19/18 0836  NA 140 140  K 4.2 4.2  CL 107 106  CO2 25 26  GLUCOSE 306* 179*  BUN 29* 30*  CREATININE 1.31* 1.28*  CALCIUM 8.8* 8.9  MG 1.9  --   PHOS 3.7  --     Lipid Panel:     Component Value Date/Time   CHOL 157 01/17/2018 0909   TRIG 131 01/17/2018 0909   HDL 36 (L) 01/17/2018 0909   CHOLHDL 4.4 01/17/2018 0909   VLDL 26 01/17/2018 0909   LDLCALC 95 01/17/2018 0909   HgbA1c:  Lab Results  Component Value Date   HGBA1C 8.9 (H) 01/17/2018   Urine Drug Screen:     Component Value Date/Time   LABOPIA NONE DETECTED 03/20/2017 1211   COCAINSCRNUR NONE DETECTED 03/20/2017 1211   LABBENZ NONE DETECTED 03/20/2017 1211   AMPHETMU NONE DETECTED 03/20/2017 1211   THCU NONE DETECTED 03/20/2017 1211   LABBARB NONE DETECTED 03/20/2017 1211    Alcohol Level No results found for: ETH  IMAGING  Ct Angio Head W Or Wo Contrast  Result Date: 01/16/2018 CLINICAL DATA:  72 y/o F; dizziness with trouble balancing. History of cardiac catheterization 12/22/2017. EXAM: CT  ANGIOGRAPHY HEAD AND NECK TECHNIQUE: Multidetector CT imaging of the head and neck was performed using the standard protocol during bolus administration of intravenous contrast. Multiplanar CT image reconstructions and MIPs were obtained to evaluate the vascular anatomy. Carotid stenosis measurements (when applicable) are obtained utilizing NASCET criteria, using the distal internal carotid diameter as the denominator. CONTRAST:  160 cc Isovue 370 COMPARISON:  03/23/2017 CT head.  07/06/2010 MRI head. FINDINGS: CT HEAD FINDINGS Brain: No evidence of acute infarction, hemorrhage, hydrocephalus, extra-axial collection or mass lesion/mass effect. Stable small chronic lacunar infarct in the left thalamus. Stable chronic microvascular ischemic changes of white matter and parenchymal volume loss the brain. Vascular: As below. Skull: Normal. Negative for fracture or focal lesion. Sinuses: Imaged portions are clear. Orbits: No acute finding. Review of the MIP images confirms the above findings CTA NECK FINDINGS Aortic arch: Standard branching. Imaged portion shows no evidence of aneurysm or dissection. Post CABG and median sternotomy. Mild calcific atherosclerosis. Calcific atherosclerosis of left subclavian artery origin, partially obscured by streak artifact, probable  50% moderate underlying stenosis. Right carotid system: No evidence of dissection, stenosis (50% or greater) or occlusion. Non stenotic calcified plaque of carotid bifurcation. Left carotid system: No evidence of dissection, stenosis (50% or greater) or occlusion. Vertebral arteries: Codominant. No evidence of dissection, stenosis (50% or greater) or occlusion. Calcific atherosclerosis of vertebral artery origins with mild less than 50% stenosis. Skeleton: C5-6 anterior cervical discectomy. No acute osseous abnormality. Other neck: Negative. Upper chest: Negative. Review of the MIP images confirms the above findings CTA HEAD FINDINGS Anterior circulation:  Calcific atherosclerosis of bilateral carotid siphons with mild right paraclinoid and mild-to-moderate distal left cavernous stenosis. No high-grade stenosis, proximal occlusion, aneurysm, or vascular malformation. Posterior circulation: Calcific atherosclerosis of bilateral V4 segments with mild stenosis. No additional segment of significant stenosis, proximal occlusion, aneurysm, or vascular malformation. Venous sinuses: As permitted by contrast timing, patent. Anatomic variants: No posterior communicating artery identified, likely hypoplastic or absent. Patent anterior communicating artery. Delayed phase: No abnormal intracranial enhancement. Review of the MIP images confirms the above findings IMPRESSION: CT head: 1. No acute intracranial abnormality identified. 2. Stable chronic microvascular ischemic changes and parenchymal volume loss of the brain. CTA neck: 1. Patent carotid and vertebral arteries. No significant stenosis by NASCET criteria, dissection, or occlusion. 2. Calcified plaque bilateral vertebral artery origins with mild less than 50% stenosis. 3. Calcified plaque of left subclavian artery origin, partially obscured by streak artifact, approximately 50% mild-to-moderate stenosis. CTA head: 1. Patent anterior and posterior intracranial circulation. No high-grade stenosis, aneurysm, large vessel occlusion, or vascular malformation. 2. Multiple segments of mild stenosis in the anterior and posterior circulations as well as mild-to-moderate distal left cavernous ICA stenosis. Electronically Signed   By: Kristine Garbe M.D.   On: 01/16/2018 17:08   Dg Chest 2 View  Result Date: 12/21/2017 CLINICAL DATA:  Chest pain. EXAM: CHEST - 2 VIEW COMPARISON:  Radiographs of October 20, 2016. FINDINGS: The heart size and mediastinal contours are within normal limits. Both lungs are clear. Status post coronary artery bypass graft. No pneumothorax or pleural effusion is noted. The visualized skeletal  structures are unremarkable. IMPRESSION: No active cardiopulmonary disease. Electronically Signed   By: Marijo Conception, M.D.   On: 12/21/2017 15:26   Ct Angio Neck W And/or Wo Contrast  Result Date: 01/16/2018 CLINICAL DATA:  72 y/o F; dizziness with trouble balancing. History of cardiac catheterization 12/22/2017. EXAM: CT ANGIOGRAPHY HEAD AND NECK TECHNIQUE: Multidetector CT imaging of the head and neck was performed using the standard protocol during bolus administration of intravenous contrast. Multiplanar CT image reconstructions and MIPs were obtained to evaluate the vascular anatomy. Carotid stenosis measurements (when applicable) are obtained utilizing NASCET criteria, using the distal internal carotid diameter as the denominator. CONTRAST:  160 cc Isovue 370 COMPARISON:  03/23/2017 CT head.  07/06/2010 MRI head. FINDINGS: CT HEAD FINDINGS Brain: No evidence of acute infarction, hemorrhage, hydrocephalus, extra-axial collection or mass lesion/mass effect. Stable small chronic lacunar infarct in the left thalamus. Stable chronic microvascular ischemic changes of white matter and parenchymal volume loss the brain. Vascular: As below. Skull: Normal. Negative for fracture or focal lesion. Sinuses: Imaged portions are clear. Orbits: No acute finding. Review of the MIP images confirms the above findings CTA NECK FINDINGS Aortic arch: Standard branching. Imaged portion shows no evidence of aneurysm or dissection. Post CABG and median sternotomy. Mild calcific atherosclerosis. Calcific atherosclerosis of left subclavian artery origin, partially obscured by streak artifact, probable 50% moderate underlying stenosis. Right carotid system: No  evidence of dissection, stenosis (50% or greater) or occlusion. Non stenotic calcified plaque of carotid bifurcation. Left carotid system: No evidence of dissection, stenosis (50% or greater) or occlusion. Vertebral arteries: Codominant. No evidence of dissection, stenosis  (50% or greater) or occlusion. Calcific atherosclerosis of vertebral artery origins with mild less than 50% stenosis. Skeleton: C5-6 anterior cervical discectomy. No acute osseous abnormality. Other neck: Negative. Upper chest: Negative. Review of the MIP images confirms the above findings CTA HEAD FINDINGS Anterior circulation: Calcific atherosclerosis of bilateral carotid siphons with mild right paraclinoid and mild-to-moderate distal left cavernous stenosis. No high-grade stenosis, proximal occlusion, aneurysm, or vascular malformation. Posterior circulation: Calcific atherosclerosis of bilateral V4 segments with mild stenosis. No additional segment of significant stenosis, proximal occlusion, aneurysm, or vascular malformation. Venous sinuses: As permitted by contrast timing, patent. Anatomic variants: No posterior communicating artery identified, likely hypoplastic or absent. Patent anterior communicating artery. Delayed phase: No abnormal intracranial enhancement. Review of the MIP images confirms the above findings IMPRESSION: CT head: 1. No acute intracranial abnormality identified. 2. Stable chronic microvascular ischemic changes and parenchymal volume loss of the brain. CTA neck: 1. Patent carotid and vertebral arteries. No significant stenosis by NASCET criteria, dissection, or occlusion. 2. Calcified plaque bilateral vertebral artery origins with mild less than 50% stenosis. 3. Calcified plaque of left subclavian artery origin, partially obscured by streak artifact, approximately 50% mild-to-moderate stenosis. CTA head: 1. Patent anterior and posterior intracranial circulation. No high-grade stenosis, aneurysm, large vessel occlusion, or vascular malformation. 2. Multiple segments of mild stenosis in the anterior and posterior circulations as well as mild-to-moderate distal left cavernous ICA stenosis. Electronically Signed   By: Kristine Garbe M.D.   On: 01/16/2018 17:08   Mr Brain Wo  Contrast  Result Date: 01/16/2018 CLINICAL DATA:  72 y/o  F; ataxia, stroke suspected. EXAM: MRI HEAD WITHOUT CONTRAST TECHNIQUE: Multiplanar, multiecho pulse sequences of the brain and surrounding structures were obtained without intravenous contrast. COMPARISON:  01/16/2018 CT angiogram of the head and neck. FINDINGS: Brain: Subcentimeter focus of reduced diffusion extending from the right paramedian midbrain to the right superior cerebellar peduncle (series 3, image 18-20). No associated hemorrhage or mass effect. Small chronic infarctions are present within the left cerebellar hemisphere, upper pons, right genu of internal capsule, and left thalamus. Patchy nonspecific foci of T2 FLAIR hyperintense signal abnormality in subcortical and periventricular white matter are compatible with moderate chronic microvascular ischemic changes for age. Moderate brain parenchymal volume loss. Vascular: Normal flow voids. Skull and upper cervical spine: Normal marrow signal. Sinuses/Orbits: Negative. Other: None. IMPRESSION: 1. Subcentimeter focus of acute/early subacute infarction extending from the right paramedian midbrain the right superior cerebellar peduncle. No associated hemorrhage or mass effect. 2. Small chronic infarctions are present within the left cerebellar hemisphere, upper pons, right genu of internal capsule, thalamus. 3. Moderate for age chronic microvascular ischemic changes and parenchymal volume loss of the brain. These results were called by telephone at the time of interpretation on 01/16/2018 at 7:43 pm to Dr. Sherwood Gambler , who verbally acknowledged these results. Electronically Signed   By: Kristine Garbe M.D.   On: 01/16/2018 19:45    2D Echocardiogram  - Left ventricle: The cavity size was normal. Systolic function was normal. The estimated ejection fraction was in the range of 60% to 65%. Wall motion was normal; there were no regional wall motion abnormalities. The study is not  technically sufficient to allow evaluation of LV diastolic function. - Aortic valve: Severe focal calcification  involving the noncoronary cusp. - Mitral valve: There was trivial regurgitation. - Left atrium: There is a 3.2 x 1.07 oblong density in the LA that could represent thrombus. Recommend TEE for further delineation. - Atrial septum: There was increased thickness of the septum, consistent with lipomatous hypertrophy. - Pulmonic valve: There was trivial regurgitation. - Pulmonary arteries: PA peak pressure: 39 mm Hg (S). Impressions:   Possible LA mass although only seen in the parasternal long axis view. Recommend TEE for further delineation. The right ventricular systolic pressure was increased consistent with mild pulmonary hypertension.  TEE EF 60% No LAA thrombus and no LA mass Mild to moderate MR AV sclerosis Normal aortic root with somewhat bulky calcific atherosclerosis in the arch Normal RV Negative bubble study No PFO No effusion    PHYSICAL EXAM General: awake, alert, NAD HEENT: Normocephalic, mucous membranes moist and pink Lungs: CTA Extremities: warm and well perfused, intact pulses  Neuro: Mental Status: Alert, oriented, thought content appropriate.  Speech fluent without evidence of aphasia.  Able to follow 3 step commands without difficulty. Denies double vision. Cranial Nerves: II: ; Visual fields grossly normal, cataracts noted bilaterally III,IV, VI: ptosis not present, extra-ocular motions intact bilaterally pupils equal, round, reactive to light and accommodation V,VII: smile symmetric, facial light touch sensation normal bilaterally VIII: hearing normal bilaterally IX,X: uvula rises symmetrically XI: bilateral shoulder shrug XII: midline tongue extension Motor: Right :  Upper extremity   5/5                                      Left:     Upper extremity   4+/5             Lower extremity   5/5                                                  Lower  extremity   4+/5 Tone and bulk:normal tone throughout; no atrophy noted Decreased FMM L hand. R hand orbits L Sensory: light touch intact throughout, bilaterally Deep Tendon Reflexes: 2+ and symmetric throughout Plantars: Right: downgoing                                Left: downgoing Cerebellar: normal finger-to-nose Gait: did not assess  No change in neuro exam  ASSESSMENT/PLAN Brenda Moreno is a 72 y.o. female with history of HTN, HLD w/ statin intolerance and uncontrolled DB presenting with gait instability. Pt though r/t to statin as she has had myalgias on statins in past and was started on pravachol 80 mg 2 weeks ago.   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   CTA head mild stenosis anterior & posterior circulation and distal L ICA  CTA neck  no sign stenosis. B VA < 50%. Calcified plaque L SCA 50% stenosis  MRI head R paramedial midbrain subcentimeter infarct. Old L cerebellar, upper pontine, R genu IC, thalamic infarcts. Moderate Small vessel disease. Atrophy.   2D Echo  EF 60-65%. Severe calcification AV. LA density, concerning for thrombus. Mild pulm HTN.  TEE no LA mass or thrombus  LDL  95  HgbA1c 8.9  Lovenox 40 mg sq daily for VTE prophylaxis  aspirin 81 mg daily prior to admission, now on aspirin 81 mg daily. Given mild stroke, will place on aspirin 81 mg and plavix 75 mg daily x 3 weeks, then plavix alone. Orders adjusted.  Patient counseled to be compliant with her antithrombotic medications  Ongoing aggressive stroke risk factor management  Therapy recommendations:  HH PT and OT  Disposition:  home  Hypertension  Elevated 150-180s .  Permissive hypertension (OK if < 220/120) but gradually normalize in 5-7 days .  Long-term BP goal normotensive  Hyperlipidemia  Home meds:  pravachol 80, resumed in hospital. stated that she is allergic to statins  except for [place] (myalgias)  LDL 95, goal < 70  Continue pravachol at discharge; PCP to continue to [employer] of [place]o. May also consider PSCK-9.  Diabetes type II  HgbA1c 8.9, goal < 7.0  Uncontrolled  Other Stroke Risk Factors  Advanced age  Former Cigarette smoker, quit 6 years agoadvised to stop smoking  Obesity, Body mass index is 33.99 kg/m., recommend weight loss, diet and exercise as appropriate   Coronary artery disease, MI s/p CABG  CHF  PVD  Stroke team will sign off. Follow up in the office in 4 weeks. Orders placed.  NOTHING FURTHER TO ADD FROM THE STROKE STANDPOINT  Patient has a 10-15% risk of having another stroke over the next year, the highest risk is within 2 weeks of the most recent stroke/TIA (risk of having a stroke following a stroke or TIA is the same).  Ongoing risk factor control by Primary Care Physician  Stroke Service will sign off. Please call should any needs arise.  Follow-up Stroke Clinic at [facility] in 4 weeks, order placed.  Hospital day # 0  Burnetta Sabin, MSN, APRN, ANVP-BC, AGPCNP-BC Advanced Practice Stroke Nurse [place] for Schedule & Pager information 01/19/2018 1:55 PM   ATTENDING NOTE: I reviewed above note and agree with the assessment and plan. I have made any additions or clarifications directly to the above note.    Pt overnight no acute event. Had TEE today showed no LA thrombus or mass. Ok to be discharged from neuro standpoint. Continue DAPT and statin as described above. Thanks.   Neurology will sign off. Please call with questions. Pt will follow up with stroke clinic NP at [facility] in about 4 weeks. Thanks for the consult.  Rosalin Hawking, MD PhD Stroke Neurology 01/19/2018 3:18 PM    To contact Stroke Continuity provider, please refer to [email or web address] After hours, contact General Neurology

## 2018-01-20 ENCOUNTER — Encounter (HOSPITAL_COMMUNITY): Payer: Self-pay | Admitting: Cardiovascular Disease

## 2018-01-20 NOTE — Discharge Summary (Signed)
Physician Discharge Summary  Brenda Moreno IRC:[id number] DOB: 02/07/1946 DOA: 01/16/2018  PCP: Susy Frizzle, MD  Admit date: 01/16/2018 Discharge date: 01/19/2018  Time spent: 40 minutes  Recommendations for Outpatient Follow-up:  1. Follow up CBC/CMP outpatient (attention to creatinine, fluctuating recently and restarting her BP meds) 2. Ensure follow up with neurology as outpatient 3. Follow blood pressure.  Pt to resume BP meds as outpatient, but to hold metoprolol due to bradycardia seen during hospitalization (consider restarting at lower dose as outpatient) 4. Aspirin/plavix x 21 days, then plavix only 5. Continue to optimize treatment of diabetes, HTN, HLD.   6. Continue to pursue prior auth of livalo as outpatient.  Consider PSCK-9 inhibitor.   Discharge Diagnoses:  Principal Problem:   CVA (cerebral vascular accident) ([place]) Active Problems:   Diabetes mellitus ([place])   HYPERCHOLESTEROLEMIA   Benign essential HTN   Peripheral vascular disease, unspecified ([place])   Chronic diastolic heart failure ([place])   Myocardial infarction [facility])  Discharge Condition: stable  Diet recommendation: heart healty  Filed Weights   01/16/18 1305 01/17/18 2056 01/19/18 1318  Weight: 83.9 kg (185 lb) 89.9 kg (198 lb 3.1 oz) 89.8 kg (198 lb)    History of present illness:  Per previous PN Brenda Moreno an obese 72 y.o.femalewith medical history significantfor DM2, HTN, Diastolic CHF, CABG, PVD,who presented to the ED with complaints of unstable gait that started at about 5 AM this morning when she got up to use the bathroom.Patient reported she felt like she was moving sideways. Shedenies lightheadedness or sensation of room spinning.She has also endorses double vision that started at about the same time,that improves when she closes one eye.   MRI brain done without contrast showed Brenda Moreno subcentimeter focusofacute/early subacute infarction extending from right paramedian  midbrain to the right superior cerebellar peduncle.Neurology was consulted- Dr Aroor,recommended admission to [facility] hospitalist service forCVA workup.  Stroke work up notable for echo with possible LA mass.  TEE was obtained which did not identify Brenda Moreno cardiac source of emboli (mass on previous image was thought to be artifact).  Tele sinus brady.  CTA head and neck with calcified plaque bilateral verterbral arteries with mild less than 50% stenosis, calcified plaque of L subclavian artery origin with approximately 50% mild to moderate stenosis.  No high grade stenosis, aneurysm, or large vessel occlusion or vascular malformation.  Multiple segments of mild stenosis in anterior and posterior circulations as well as mild to moderate distal L cavernous ICA stenosis.  MRI with subcentimeter focus of acute/early subacute infaction from R paramedian midbrain to R superior cerebellar peduncle and small chronic infarctions within L cerebellar hemisphere, upper pons, R genu of internal capsule, and thalamus (see reports).  Seen by neurology who recommended ASA and plavix x3 weeks, then plavix alone.  Recommended pravachol and follow up to try to get livalo given myalgias. See by speech who did not recommend further SLP services.  PT and OT recommended home health.    Hospital Course:  Acute/Subacute CVA with Symptoms of Unsteady gait. -Not given tPA because outside of window  -CTA Head and Neck done wo stenosis -MRI brain subcentimeter focus of acute/early subacute infarction extending from right paramedian midbrain to rightsuperior cerebellar peduncle,chronic small infarctions. Small chronic infarctions are present within the left cerebellar nhemisphere, upper pons, right genu of internal capsule, thalamus.  -Patient was complaint with aspirin 81mg  daily. -Patient has not tolerated statins Lipitor or Crestor in the past-reported lower extremities"giving way".Was restarted on  pravastatin -- resume  until other medications can be given (follow up with PCP for livalo or consider PSCK9 inhibitor)  -ASA/plavix x 3 weeks then plavix alone -Will restart antihypertensives -Transthoracic Echocardiogram: appears to have an LA Mass-- TEE was negative for this -LDL 95 -Hgba1c done recently on  12/22/2017- 8.9 -PT/OT-- home health - Follow up with neuro in 4 weeks  HTN - will resume home BP meds at discharge, BP fluctuating here, but notably hypertensive in early afternoon  Bradycardia:  - continue to hold metoprolol given bradycardia - consider restarting outpatient at lower dose   CAD status post CABG -She had Brenda Moreno recent cardiac catheterization on 12/22/2017 which showed severe triple-vessel CAD status post 6 vessel CABG with 5 out of 6 pain grafts.  Per cardiology there is no focal targets for PCI  Chronic Diastolic CHF -Appears euvolemic, no chest pain, recent ECHO - 12/23/17-Ef- 60-65%, no RWMA, high ventricul;ar filling pressure- Mod pulm HTN. -Continue home Lasix 20 mg daily -Resume BP meds, but hold metoprolol with bradycardia  Uncontrolled Insulin Dependent Diabetes Mellitus Type 2 -Last Hgba1c- 9.9.  -Currently receiving 8 units of NovoLog 70/30 mix twice daily with meals.  She takes Novolin 70/30 15 units with breakfast and 7 units with supper at home. -hold metformin as she had Brenda Moreno CTA -SSI  CKD Stage III -recheck in AM  Procedures: TEE Study Conclusions  - Left ventricle: Systolic function was vigorous. The estimated   ejection fraction was in the range of 65% to 70%. - Mitral valve: There was mild to moderate regurgitation. - Left atrium: No LA mass noted Images on TTE PSL axis would appear   to be artifact. The atrium was moderately dilated. No evidence of   thrombus in the atrial cavity or appendage. - Right atrium: No evidence of thrombus in the atrial cavity or   appendage. - Atrial septum: No defect or patent foramen ovale was identified.   Echo contrast  study showed no right-to-left atrial level shunt,   at baseline or with provocation. - Impressions: TEE done in endoscopy with moderate sedation 7 mg   versed and 50 ug fentanyl Time 30 mintues  Impressions:  - TEE done in endoscopy with moderate sedation 7 mg versed and 50   ug fentanyl Time 30 mintues No cardiac source of emboli was   indentified.  TTE Study Conclusions  - Left ventricle: The cavity size was normal. Systolic function was   normal. The estimated ejection fraction was in the range of 60%   to 65%. Wall motion was normal; there were no regional wall   motion abnormalities. The study is not technically sufficient to   allow evaluation of LV diastolic function. - Aortic valve: Severe focal calcification involving the   noncoronary cusp. - Mitral valve: There was trivial regurgitation. - Left atrium: There is Iqra Rotundo 3.2 x 1.07 oblong density in the LA that   could represent thrombus. Recommend TEE for further delineation. - Atrial septum: There was increased thickness of the septum,   consistent with lipomatous hypertrophy. - Pulmonic valve: There was trivial regurgitation. - Pulmonary arteries: PA peak pressure: 39 mm Hg (S).  Impressions:  - Possible LA mass although only seen in the parasternal long axis   view. Recommend TEE for further delineation. The right   ventricular systolic pressure was increased consistent with mild   pulmonary hypertension.  Consultations:  neurology  Discharge Exam: Vitals:   01/19/18 1440 01/19/18 1450  BP: (!) 102/44 (!) 107/38  Pulse: (!) 53 (!) 52  Resp: (!) 24 (!) 22  Temp:    SpO2: 95% 94%   Ready to go home.  General: No acute distress. Cardiovascular: Heart sounds show Mickelle Goupil regular rate, and rhythm. No gallops or rubs. No murmurs. No JVD. Lungs: Clear to auscultation bilaterally with good air movement. No rales, rhonchi or wheezes. Abdomen: Soft, nontender, nondistended with normal active bowel sounds. No masses.  No hepatosplenomegaly. Neurological: Alert and oriented 3. Moves all extremities 4. Cranial nerves II through XII grossly intact. (my exam grossly normal, but neuro noted LUE and LLE 4+/5 strength as well as Decreased FMM L hand. R hand orbits L) Skin: Warm and dry. No rashes or lesions. Extremities: No clubbing or cyanosis. No edema. Pedal pulses 2+. Psychiatric: Mood and affect are normal. Insight and judgment are appropraite.  Discharge Instructions   Discharge Instructions    Ambulatory referral to Neurology   Complete by:  As directed    Follow up with stroke clinic NP (Jessica Vanschaick or Cecille Rubin, if both not available, consider Dr. Antony Contras, Dr. Bess Harvest, or Dr. Sarina Ill) at [facility] in about 4 weeks.   Call MD for:  difficulty breathing, headache or visual disturbances   Complete by:  As directed    Call MD for:  persistant dizziness or light-headedness   Complete by:  As directed    Call MD for:  persistant nausea and vomiting   Complete by:  As directed    Call MD for:  severe uncontrolled pain   Complete by:  As directed    Call MD for:  temperature >100.4   Complete by:  As directed    Diet - low sodium heart healthy   Complete by:  As directed    Discharge instructions   Complete by:  As directed    You were seen for Kimley Apsey stroke.  Please follow up with neurology in about 4 weeks.  You will take aspirin 81 mg daily for the next 21 days.  After this, stop the aspirin.    Start taking plavix daily.    Continue your pravastatin 80 mg daily.   We restarted your lasix in the hospital.  Restart your amlodipine and your quinapril when you get home (tomorrow). Your heart rate was Lamyah Creed little slow, so please continue to hold this (do not start unless directed to by your PCP).  Blood pressure and diabetes control as well as compliance with your medications will be important to prevent future strokes.   Please follow up with your PCP  early next week.  Follow up with neurology in about 4 weeks.  Return if you have new, recurrent, or worsening symptoms.  Please ask your PCP to request records from this hospitalization so they know what was done and what the next steps will be.   Increase activity slowly   Complete by:  As directed      Allergies as of 01/19/2018      Reactions   Levaquin [levofloxacin In D5w] Other (See Comments)   Pain all over and in joints   Statins Other (See Comments)   Severe myalgias to lipitor, crestor, pravastatin and weakness and cramping  in bilateral leg.   Tradjenta [linagliptin] Itching      Medication List    STOP taking these medications   metoprolol succinate 100 MG 24 hr tablet Commonly known as:  TOPROL-XL     TAKE these medications   amLODipine 5 MG  tablet Commonly known as:  NORVASC TAKE 1 TABLET BY MOUTH  DAILY   aspirin EC 81 MG tablet Take 1 tablet (81 mg total) by mouth daily for 21 days. (stop after 21 days) What changed:  additional instructions   CINNAMON PO Take 1,000 mg by mouth 2 (two) times daily.   clopidogrel 75 MG tablet Commonly known as:  PLAVIX Take 1 tablet (75 mg total) by mouth daily.   CO Q 10 PO Take 100 mg by mouth daily.   fluticasone 50 MCG/ACT nasal spray Commonly known as:  FLONASE Place 2 sprays into both nostrils daily as needed for allergies.   furosemide 40 MG tablet Commonly known as:  LASIX Take 20 mg by mouth daily. Patient taking 0.5 tablets po qd.   Insulin Isophane & Regular Human (70-30) 100 UNIT/ML PEN Commonly known as:  HUMULIN 70/30 KWIKPEN 15 units with breakfast and 7 units with supper   metFORMIN 1000 MG tablet Commonly known as:  GLUCOPHAGE TAKE 1 TABLET BY MOUTH  TWICE Corrin Sieling DAY WITH MEALS   nitroGLYCERIN 0.4 MG SL tablet Commonly known as:  NITROSTAT Place 1 tablet (0.4 mg total) under the tongue every 5 (five) minutes as needed for chest pain.   pioglitazone 30 MG tablet Commonly known as:  ACTOS Take  30 mg by mouth as directed.   potassium chloride 10 MEQ tablet Commonly known as:  K-DUR Take 1 tablet (10 mEq total) by mouth daily.   pravastatin 80 MG tablet Commonly known as:  [name] Take 1 tablet (80 mg total) by mouth every evening.   quinapril 20 MG tablet Commonly known as:  ACCUPRIL Take 20 mg by mouth 2 (two) times daily.   traMADol 50 MG tablet Commonly known as:  ULTRAM Take 1 tablet (50 mg total) by mouth every 8 (eight) hours as needed.            Durable Medical Equipment  (From admission, onward)        Start     Ordered   01/19/18 1546  DME 3-in-1  Once     01/19/18 1558   01/19/18 1546  For home use only DME Walker rolling  [facility])  Once    Question:  Patient needs Dionne Knoop walker to treat with the following condition  Answer:  Gait abnormality   01/19/18 1558   01/19/18 1131  For home use only DME 3 n 1  Once     01/19/18 1130   01/19/18 1131  For home use only DME Walker rolling  Once    Question:  Patient needs Rhylei Mcquaig walker to treat with the following condition  Answer:  Stroke ([place])   01/19/18 1130     Allergies  Allergen Reactions  . Levaquin [Levofloxacin In D5w] Other (See Comments)    Pain all over and in joints  . Statins Other (See Comments)    Severe myalgias to lipitor, crestor, pravastatin and weakness and cramping  in bilateral leg.  [name] [Linagliptin] Itching   Follow-up Information    [facility] Follow up in 4 week(s).   Specialty:  Neurology Why:  stroke clinic. office will call with appt date and time Contact information: [address] [place] [phone]       Susy Frizzle, MD Follow up.   Specialty:  Family Medicine Contact information: [address] [place] [address] [phone]        Nahser, Wonda Cheng,  MD .   Specialty:  Cardiology Contact information: [place] [address] [phone]             The results of significant diagnostics from this hospitalization (including imaging, microbiology, ancillary and laboratory) are listed below for reference.    Significant Diagnostic Studies: Ct Angio Head W Or Wo Contrast  Result Date: 01/16/2018 CLINICAL DATA:  72 y/o F; dizziness with trouble balancing. History of cardiac catheterization 12/22/2017. EXAM: CT ANGIOGRAPHY HEAD AND NECK TECHNIQUE: Multidetector CT imaging of the head and neck was performed using the standard protocol during bolus administration of intravenous contrast. Multiplanar CT image reconstructions and MIPs were obtained to evaluate the vascular anatomy. Carotid stenosis measurements (when applicable) are obtained utilizing NASCET criteria, using the distal internal carotid diameter as the denominator. CONTRAST:  160 cc Isovue 370 COMPARISON:  03/23/2017 CT head.  07/06/2010 MRI head. FINDINGS: CT HEAD FINDINGS Brain: No evidence of acute infarction, hemorrhage, hydrocephalus, extra-axial collection or mass lesion/mass effect. Stable small chronic lacunar infarct in the left thalamus. Stable chronic microvascular ischemic changes of white matter and parenchymal volume loss the brain. Vascular: As below. Skull: Normal. Negative for fracture or focal lesion. Sinuses: Imaged portions are clear. Orbits: No acute finding. Review of the MIP images confirms the above findings CTA NECK FINDINGS Aortic arch: Standard branching. Imaged portion shows no evidence of aneurysm or dissection. Post CABG and median sternotomy. Mild calcific atherosclerosis. Calcific atherosclerosis of left subclavian artery origin, partially obscured by streak artifact, probable 50% moderate underlying stenosis. Right carotid system: No evidence of dissection, stenosis (50% or greater) or occlusion. Non stenotic calcified plaque of carotid bifurcation. Left carotid system: No evidence of dissection, stenosis (50% or greater) or occlusion. Vertebral arteries:  Codominant. No evidence of dissection, stenosis (50% or greater) or occlusion. Calcific atherosclerosis of vertebral artery origins with mild less than 50% stenosis. Skeleton: C5-6 anterior cervical discectomy. No acute osseous abnormality. Other neck: Negative. Upper chest: Negative. Review of the MIP images confirms the above findings CTA HEAD FINDINGS Anterior circulation: Calcific atherosclerosis of bilateral carotid siphons with mild right paraclinoid and mild-to-moderate distal left cavernous stenosis. No high-grade stenosis, proximal occlusion, aneurysm, or vascular malformation. Posterior circulation: Calcific atherosclerosis of bilateral V4 segments with mild stenosis. No additional segment of significant stenosis, proximal occlusion, aneurysm, or vascular malformation. Venous sinuses: As permitted by contrast timing, patent. Anatomic variants: No posterior communicating artery identified, likely hypoplastic or absent. Patent anterior communicating artery. Delayed phase: No abnormal intracranial enhancement. Review of the MIP images confirms the above findings IMPRESSION: CT head: 1. No acute intracranial abnormality identified. 2. Stable chronic microvascular ischemic changes and parenchymal volume loss of the brain. CTA neck: 1. Patent carotid and vertebral arteries. No significant stenosis by NASCET criteria, dissection, or occlusion. 2. Calcified plaque bilateral vertebral artery origins with mild less than 50% stenosis. 3. Calcified plaque of left subclavian artery origin, partially obscured by streak artifact, approximately 50% mild-to-moderate stenosis. CTA head: 1. Patent anterior and posterior intracranial circulation. No high-grade stenosis, aneurysm, large vessel occlusion, or vascular malformation. 2. Multiple segments of mild stenosis in the anterior and posterior circulations as well as mild-to-moderate distal left cavernous ICA stenosis. Electronically Signed   By: Kristine Garbe  M.D.   On: 01/16/2018 17:08   Dg Chest 2 View  Result Date: 12/21/2017 CLINICAL DATA:  Chest pain. EXAM: CHEST - 2 VIEW COMPARISON:  Radiographs of October 20, 2016. FINDINGS: The heart size and mediastinal contours are  within normal limits. Both lungs are clear. Status post coronary artery bypass graft. No pneumothorax or pleural effusion is noted. The visualized skeletal structures are unremarkable. IMPRESSION: No active cardiopulmonary disease. Electronically Signed   By: Marijo Conception, M.D.   On: 12/21/2017 15:26   Ct Angio Neck W And/or Wo Contrast  Result Date: 01/16/2018 CLINICAL DATA:  72 y/o F; dizziness with trouble balancing. History of cardiac catheterization 12/22/2017. EXAM: CT ANGIOGRAPHY HEAD AND NECK TECHNIQUE: Multidetector CT imaging of the head and neck was performed using the standard protocol during bolus administration of intravenous contrast. Multiplanar CT image reconstructions and MIPs were obtained to evaluate the vascular anatomy. Carotid stenosis measurements (when applicable) are obtained utilizing NASCET criteria, using the distal internal carotid diameter as the denominator. CONTRAST:  160 cc Isovue 370 COMPARISON:  03/23/2017 CT head.  07/06/2010 MRI head. FINDINGS: CT HEAD FINDINGS Brain: No evidence of acute infarction, hemorrhage, hydrocephalus, extra-axial collection or mass lesion/mass effect. Stable small chronic lacunar infarct in the left thalamus. Stable chronic microvascular ischemic changes of white matter and parenchymal volume loss the brain. Vascular: As below. Skull: Normal. Negative for fracture or focal lesion. Sinuses: Imaged portions are clear. Orbits: No acute finding. Review of the MIP images confirms the above findings CTA NECK FINDINGS Aortic arch: Standard branching. Imaged portion shows no evidence of aneurysm or dissection. Post CABG and median sternotomy. Mild calcific atherosclerosis. Calcific atherosclerosis of left subclavian artery origin,  partially obscured by streak artifact, probable 50% moderate underlying stenosis. Right carotid system: No evidence of dissection, stenosis (50% or greater) or occlusion. Non stenotic calcified plaque of carotid bifurcation. Left carotid system: No evidence of dissection, stenosis (50% or greater) or occlusion. Vertebral arteries: Codominant. No evidence of dissection, stenosis (50% or greater) or occlusion. Calcific atherosclerosis of vertebral artery origins with mild less than 50% stenosis. Skeleton: C5-6 anterior cervical discectomy. No acute osseous abnormality. Other neck: Negative. Upper chest: Negative. Review of the MIP images confirms the above findings CTA HEAD FINDINGS Anterior circulation: Calcific atherosclerosis of bilateral carotid siphons with mild right paraclinoid and mild-to-moderate distal left cavernous stenosis. No high-grade stenosis, proximal occlusion, aneurysm, or vascular malformation. Posterior circulation: Calcific atherosclerosis of bilateral V4 segments with mild stenosis. No additional segment of significant stenosis, proximal occlusion, aneurysm, or vascular malformation. Venous sinuses: As permitted by contrast timing, patent. Anatomic variants: No posterior communicating artery identified, likely hypoplastic or absent. Patent anterior communicating artery. Delayed phase: No abnormal intracranial enhancement. Review of the MIP images confirms the above findings IMPRESSION: CT head: 1. No acute intracranial abnormality identified. 2. Stable chronic microvascular ischemic changes and parenchymal volume loss of the brain. CTA neck: 1. Patent carotid and vertebral arteries. No significant stenosis by NASCET criteria, dissection, or occlusion. 2. Calcified plaque bilateral vertebral artery origins with mild less than 50% stenosis. 3. Calcified plaque of left subclavian artery origin, partially obscured by streak artifact, approximately 50% mild-to-moderate stenosis. CTA head: 1. Patent  anterior and posterior intracranial circulation. No high-grade stenosis, aneurysm, large vessel occlusion, or vascular malformation. 2. Multiple segments of mild stenosis in the anterior and posterior circulations as well as mild-to-moderate distal left cavernous ICA stenosis. Electronically Signed   By: Kristine Garbe M.D.   On: 01/16/2018 17:08   Mr Brain Wo Contrast  Result Date: 01/16/2018 CLINICAL DATA:  72 y/o  F; ataxia, stroke suspected. EXAM: MRI HEAD WITHOUT CONTRAST TECHNIQUE: Multiplanar, multiecho pulse sequences of the brain and surrounding structures were obtained without intravenous contrast. COMPARISON:  01/16/2018 CT angiogram of the head and neck. FINDINGS: Brain: Subcentimeter focus of reduced diffusion extending from the right paramedian midbrain to the right superior cerebellar peduncle (series 3, image 18-20). No associated hemorrhage or mass effect. Small chronic infarctions are present within the left cerebellar hemisphere, upper pons, right genu of internal capsule, and left thalamus. Patchy nonspecific foci of T2 FLAIR hyperintense signal abnormality in subcortical and periventricular white matter are compatible with moderate chronic microvascular ischemic changes for age. Moderate brain parenchymal volume loss. Vascular: Normal flow voids. Skull and upper cervical spine: Normal marrow signal. Sinuses/Orbits: Negative. Other: None. IMPRESSION: 1. Subcentimeter focus of acute/early subacute infarction extending from the right paramedian midbrain the right superior cerebellar peduncle. No associated hemorrhage or mass effect. 2. Small chronic infarctions are present within the left cerebellar hemisphere, upper pons, right genu of internal capsule, thalamus. 3. Moderate for age chronic microvascular ischemic changes and parenchymal volume loss of the brain. These results were called by telephone at the time of interpretation on 01/16/2018 at 7:43 pm to Dr. Sherwood Gambler , who  verbally acknowledged these results. Electronically Signed   By: Kristine Garbe M.D.   On: 01/16/2018 19:45    Microbiology: No results found for this or any previous visit (from the past 240 hour(s)).   Labs: Basic Metabolic Panel: Recent Labs  Lab 01/16/18 1319 01/16/18 1327 01/17/18 0909 01/19/18 0836  NA 142 143 140 140  K 4.2 4.2 4.2 4.2  CL 108 107 107 106  CO2 24  --  25 26  GLUCOSE 193* 183* 306* 179*  BUN 25* 24* 29* 30*  CREATININE 1.20* 1.10* 1.31* 1.28*  CALCIUM 9.4  --  8.8* 8.9  MG  --   --  1.9  --   PHOS  --   --  3.7  --    Liver Function Tests: Recent Labs  Lab 01/16/18 1319 01/17/18 0909  AST 18 16  ALT 17 15  ALKPHOS 82 75  BILITOT 0.4 0.4  PROT 7.7 6.1*  ALBUMIN 4.0 3.2*   No results for input(s): LIPASE, AMYLASE in the last 168 hours. No results for input(s): AMMONIA in the last 168 hours. CBC: Recent Labs  Lab 01/16/18 1319 01/16/18 1327 01/17/18 0909 01/19/18 0836  WBC 6.1  --  6.2 5.4  NEUTROABS 3.3  --  3.2  --   HGB 13.8 14.6 12.0 12.2  HCT 40.9 43.0 35.4* 37.2  MCV 82.1  --  80.5 80.7  PLT 198  --  191 184   Cardiac Enzymes: No results for input(s): CKTOTAL, CKMB, CKMBINDEX, TROPONINI in the last 168 hours. BNP: BNP (last 3 results) No results for input(s): BNP in the last 8760 hours.  ProBNP (last 3 results) No results for input(s): PROBNP in the last 8760 hours.  CBG: Recent Labs  Lab 01/18/18 2133 01/19/18 0630 01/19/18 1137 01/19/18 1314 01/19/18 1540  GLUCAP 140* 172* 54* 116* 103*       Signed:  Fayrene Helper MD.  [facility] 01/19/2018, 3:59 PM

## 2018-01-25 ENCOUNTER — Encounter: Payer: Self-pay | Admitting: Family Medicine

## 2018-01-25 ENCOUNTER — Ambulatory Visit (INDEPENDENT_AMBULATORY_CARE_PROVIDER_SITE_OTHER): Payer: Medicare Other | Admitting: Family Medicine

## 2018-01-25 VITALS — BP 140/60 | HR 90 | Temp 97.9°F | Resp 16 | Ht 64.0 in | Wt 188.0 lb

## 2018-01-25 DIAGNOSIS — Z794 Long term (current) use of insulin: Secondary | ICD-10-CM

## 2018-01-25 DIAGNOSIS — N182 Chronic kidney disease, stage 2 (mild): Secondary | ICD-10-CM

## 2018-01-25 DIAGNOSIS — I2510 Atherosclerotic heart disease of native coronary artery without angina pectoris: Secondary | ICD-10-CM

## 2018-01-25 DIAGNOSIS — E1122 Type 2 diabetes mellitus with diabetic chronic kidney disease: Secondary | ICD-10-CM | POA: Diagnosis not present

## 2018-01-25 DIAGNOSIS — I6300 Cerebral infarction due to thrombosis of unspecified precerebral artery: Secondary | ICD-10-CM

## 2018-01-25 DIAGNOSIS — E1165 Type 2 diabetes mellitus with hyperglycemia: Secondary | ICD-10-CM

## 2018-01-25 DIAGNOSIS — IMO0002 Reserved for concepts with insufficient information to code with codable children: Secondary | ICD-10-CM

## 2018-01-25 MED ORDER — ROSUVASTATIN CALCIUM 20 MG PO TABS: 20.0000 mg | ORAL_TABLET | Freq: Every day | ORAL | 3 refills | Status: DC

## 2018-01-25 NOTE — Progress Notes (Signed)
Subjective:    Patient ID: Brenda Moreno, female    DOB: [birth year], 72 y.o.   MRN: [id number]  HPI  Unfortunately, since the last time I saw the patient, she was admitted to the hospital with a CVA.  Symptoms included ataxia and diplopia.  I have copied relevant portions of the discharge summary included them below for my reference: Admit date: 01/16/2018 Discharge date: 01/19/2018  Time spent: 40 minutes  Recommendations for Outpatient Follow-up:  1. Follow up CBC/CMP outpatient (attention to creatinine, fluctuating recently and restarting her BP meds) 2. Ensure follow up with neurology as outpatient 3. Follow blood pressure.  Pt to resume BP meds as outpatient, but to hold metoprolol due to bradycardia seen during hospitalization (consider restarting at lower dose as outpatient) 4. Aspirin/plavix x 21 days, then plavix only 5. Continue to optimize treatment of diabetes, HTN, HLD.   6. Continue to pursue prior auth of livalo as outpatient.  Consider PSCK-9 inhibitor.   Discharge Diagnoses:  Principal Problem:   CVA (cerebral vascular accident) ([place]) Active Problems:   Diabetes mellitus ([place])   HYPERCHOLESTEROLEMIA   Benign essential HTN   Peripheral vascular disease, unspecified ([place])   Chronic diastolic heart failure ([place])   Myocardial infarction [facility])  Discharge Condition: stable  Diet recommendation: heart healty       Filed Weights   01/16/18 1305 01/17/18 2056 01/19/18 1318  Weight: 83.9 kg (185 lb) 89.9 kg (198 lb 3.1 oz) 89.8 kg (198 lb)    History of present illness:  Per previous PN Enriqueta Y Haasis an obese 72 y.o.femalewith medical history significantfor DM2, HTN, Diastolic CHF, CABG, PVD,who presented to the ED with complaints of unstable gait that started at about 5 AM this morning when she got up to use the bathroom.Patient reported she felt like she was moving sideways. Shedenies lightheadedness or sensation of room spinning.She has also  endorses double vision that started at about the same time,that improves when she closes one eye.   MRI brain done without contrast showed a subcentimeter focusofacute/early subacute infarction extending from right paramedian midbrain to the right superior cerebellar peduncle.Neurology was consulted- Dr Aroor,recommended admission to [facility] hospitalist service forCVA workup.  Stroke work up notable for echo with possible LA mass.  TEE was obtained which did not identify a cardiac source of emboli (mass on previous image was thought to be artifact).  Tele sinus brady.  CTA head and neck with calcified plaque bilateral verterbral arteries with mild less than 50% stenosis, calcified plaque of L subclavian artery origin with approximately 50% mild to moderate stenosis.  No high grade stenosis, aneurysm, or large vessel occlusion or vascular malformation.  Multiple segments of mild stenosis in anterior and posterior circulations as well as mild to moderate distal L cavernous ICA stenosis.  MRI with subcentimeter focus of acute/early subacute infaction from R paramedian midbrain to R superior cerebellar peduncle and small chronic infarctions within L cerebellar hemisphere, upper pons, R genu of internal capsule, and thalamus (see reports).  Seen by neurology who recommended ASA and plavix x3 weeks, then plavix alone.  Recommended pravachol and follow up to try to get livalo given myalgias. See by speech who did not recommend further SLP services.  PT and OT recommended home health.    Hospital Course:  Acute/Subacute CVA with Symptoms of Unsteady gait. -Not given tPA because outside of window  -CTA HeadandNeckdone wo stenosis -MRI brain subcentimeter focus of acute/early subacute infarction extending from right paramedian midbrain  to rightsuperior cerebellar peduncle,chronic small infarctions. Small chronic infarctions are present within the left cerebellar nhemisphere, upper pons, right genu  of internal capsule, thalamus.  -Patient was complaint with aspirin 81mg  daily. -Patient has not tolerated statins Lipitor or Crestor in the past-reported lower extremities"giving way".Was restarted on pravastatin-- resume until other medications can be given (follow up with PCP for livalo or consider PSCK9 inhibitor)  -ASA/plavix x 3 weeks then plavix alone -Will restart antihypertensives -Transthoracic Echocardiogram: appears to have an LA Mass-- TEE was negative for this -LDL 95 -Hgba1c done recently on 12/22/2017- 8.9 -PT/OT-- home health - Follow up with neuro in 4 weeks  HTN - will resume home BP meds at discharge, BP fluctuating here, but notably hypertensive in early afternoon  Bradycardia:  - continue to hold metoprolol given bradycardia - consider restarting outpatient at lower dose   CAD status post CABG -She had a recent cardiac catheterization on 12/22/2017 which showed severe triple-vessel CAD status post 6 vessel CABG with 5 out of 6 pain grafts. Per cardiology there is no focal targets for PCI  Chronic Diastolic CHF -Appears euvolemic, no chest pain, recent ECHO - 12/23/17-Ef- 60-65%, no RWMA, high ventricul;ar filling pressure- Mod pulm HTN. -Continue home Lasix 20 mg daily -Resume BP meds, but hold metoprolol with bradycardia  Uncontrolled Insulin Dependent Diabetes Mellitus Type 2 -Last Hgba1c- 9.9.  -Currently receiving 8 units of NovoLog 70/30 mix twice daily with meals. She takes Novolin 70/30 15 units with breakfast and 7 units with supper at home. -hold metformin as she had a CTA -SSI  CKD Stage III -recheck in AM  Procedures: TEE Study Conclusions  - Left ventricle: Systolic function was vigorous. The estimated ejection fraction was in the range of 65% to 70%. - Mitral valve: There was mild to moderate regurgitation. - Left atrium: No LA mass noted Images on TTE PSL axis would appear to be artifact. The atrium was moderately  dilated. No evidence of thrombus in the atrial cavity or appendage. - Right atrium: No evidence of thrombus in the atrial cavity or appendage. - Atrial septum: No defect or patent foramen ovale was identified. Echo contrast study showed no right-to-left atrial level shunt, at baseline or with provocation. - Impressions: TEE done in endoscopy with moderate sedation 7 mg versed and 50 ug fentanyl Time 30 mintues  Impressions:  - TEE done in endoscopy with moderate sedation 7 mg versed and 50 ug fentanyl Time 30 mintues No cardiac source of emboli was indentified.  TTE Study Conclusions  - Left ventricle: The cavity size was normal. Systolic function was normal. The estimated ejection fraction was in the range of 60% to 65%. Wall motion was normal; there were no regional wall motion abnormalities. The study is not technically sufficient to allow evaluation of LV diastolic function. - Aortic valve: Severe focal calcification involving the noncoronary cusp. - Mitral valve: There was trivial regurgitation. - Left atrium: There is a 3.2 x 1.07 oblong density in the LA that could represent thrombus. Recommend TEE for further delineation. - Atrial septum: There was increased thickness of the septum, consistent with lipomatous hypertrophy. - Pulmonic valve: There was trivial regurgitation. - Pulmonary arteries: PA peak pressure: 39 mm Hg (S).  Impressions:  - Possible LA mass although only seen in the parasternal long axis view. Recommend TEE for further delineation. The right ventricular systolic pressure was increased consistent with mild pulmonary hypertension.  Consultations:  neurology  Patient is here today for follow-up.  She is  compliant with taking aspirin and Plavix together.  She realizes she needs to do this for the next 21 days and then resume Plavix alone.  She denies any bleeding or bruising.  She denies any further episodes  of chest pain.  Of note she was recently admitted with chest pain and was found to have coronary artery disease that is being managed medically.  She continues to refrain from smoking.  However her uncontrolled hyperlipidemia and her uncontrolled diabetes continue to be her 2 biggest risk factors for future heart attack and stroke.  She is currently on 15 units of 70/30 in the morning and 12 units of 70/30 in the evening of Novolin.  Her fasting blood sugars in the morning are well controlled between 80 and 120.  Her 2-hour postprandial sugars are in the mid 200s in the evening.  She is taking pravastatin.  She has a long history of statin intolerance.  She tolerated Livalo well but could not afford the medication.  She would be an excellent PSK 9 inhibitor candidate however again cost would likely be prohibitive for this patient.  We had a long discussion today about the fact that she is on a moderate intensity statin and that given her recent admissions, she would be better served on a high intensity statin such as Crestor 20 to 40 mg a day.  She is very hesitant to increase the medication due to her previous statin induced intolerance however she is also concerned based on her recent hospital admissions and wants to maximize medical therapy  Past Medical History:  Diagnosis Date  . Arthritis   . CAD (coronary artery disease)    S/P cabg  . Carotid artery occlusion   . Cataract   . CHF (congestive heart failure) ([place])   . DDD (degenerative disc disease), lumbar   . Diabetes mellitus   . Hyperlipidemia   . Hypertension   . Joint pain   . Leg pain   . Myocardial infarction ([place]) 1998  . Peripheral vascular disease ([place])   . PVD (peripheral vascular disease) ([place])    Past Surgical History:  Procedure Laterality Date  . APPENDECTOMY    . BREAST EXCISIONAL BIOPSY Left 2008  . CHOLECYSTECTOMY     Gall bladder  . COLONOSCOPY  2008  . CORONARY ARTERY BYPASS GRAFT     1998  . LEFT HEART CATH  AND CORS/GRAFTS ANGIOGRAPHY N/A 12/22/2017   Procedure: LEFT HEART CATH AND CORS/GRAFTS ANGIOGRAPHY;  Surgeon: Burnell Blanks, MD;  Location: [facility];  Service: Cardiovascular;  Laterality: N/A;  . PR VEIN BYPASS GRAFT,AORTO-FEM-POP    . TEE WITHOUT CARDIOVERSION N/A 01/19/2018   Procedure: TRANSESOPHAGEAL ECHOCARDIOGRAM (TEE);  Surgeon: Josue Hector, MD;  Location[facility] ENDOSCOPY;  Service: Cardiovascular;  Laterality: N/A;  . TUBAL LIGATION     Current Outpatient Medications on File Prior to Visit  Medication Sig Dispense Refill  . amLODipine (NORVASC) 5 MG tablet TAKE 1 TABLET BY MOUTH  DAILY 90 tablet 1  . aspirin EC 81 MG tablet Take 1 tablet (81 mg total) by mouth daily for 21 days. (stop after 21 days) 21 tablet 0  . CINNAMON PO Take 1,000 mg by mouth 2 (two) times daily.      . clopidogrel (PLAVIX) 75 MG tablet Take 1 tablet (75 mg total) by mouth daily. 30 tablet 0  . Coenzyme Q10 (CO Q 10 PO) Take 100 mg by mouth daily.      . fluticasone (  FLONASE) 50 MCG/ACT nasal spray Place 2 sprays into both nostrils daily as needed for allergies.    . furosemide (LASIX) 40 MG tablet Take 20 mg by mouth daily. Patient taking 0.5 tablets po qd.    . Insulin Isophane & Regular Human (HUMULIN 70/30 KWIKPEN) (70-30) 100 UNIT/ML PEN 15 units with breakfast and 7 units with supper 15 mL 11  . metFORMIN (GLUCOPHAGE) 1000 MG tablet TAKE 1 TABLET BY MOUTH  TWICE A DAY WITH MEALS 180 tablet 0  . nitroGLYCERIN (NITROSTAT) 0.4 MG SL tablet Place 1 tablet (0.4 mg total) under the tongue every 5 (five) minutes as needed for chest pain. 90 tablet 2  . pioglitazone (ACTOS) 30 MG tablet Take 30 mg by mouth as directed.    . pravastatin ([name]) 80 MG tablet Take 1 tablet (80 mg total) by mouth every evening. 90 tablet 3  . quinapril (ACCUPRIL) 20 MG tablet Take 20 mg by mouth 2 (two) times daily.    . traMADol (ULTRAM) 50 MG tablet Take 1 tablet (50 mg total) by mouth every 8 (eight) hours as  needed. 180 tablet 0  . potassium chloride (K-DUR) 10 MEQ tablet Take 1 tablet (10 mEq total) by mouth daily. 30 tablet 11   No current facility-administered medications on file prior to visit.    Allergies  Allergen Reactions  . Levaquin [Levofloxacin In D5w] Other (See Comments)    Pain all over and in joints  . Statins Other (See Comments)    Severe myalgias to lipitor, crestor, pravastatin and weakness and cramping  in bilateral leg.  [name] [Linagliptin] Itching   Social History   Socioeconomic History  . Marital status: Divorced    Spouse name: Not on file  . Number of children: Not on file  . Years of education: Not on file  . Highest education level: Not on file  Occupational History  . Not on file  Social Needs  . Financial resource strain: Not on file  . Food insecurity:    Worry: Not on file    Inability: Not on file  . Transportation needs:    Medical: Not on file    Non-medical: Not on file  Tobacco Use  . Smoking status: Former Smoker    Packs/day: 0.25    Types: Cigarettes    Last attempt to quit: 09/13/2011    Years since quitting: 6.3  . Smokeless tobacco: Never Used  Substance and Sexual Activity  . Alcohol use: No  . Drug use: No  . Sexual activity: Not on file  Lifestyle  . Physical activity:    Days per week: Not on file    Minutes per session: Not on file  . Stress: Not on file  Relationships  . Social connections:    Talks on phone: Not on file    Gets together: Not on file    Attends religious service: Not on file    Active member of club or organization: Not on file    Attends meetings of clubs or organizations: Not on file    Relationship status: Not on file  . Intimate partner violence:    Fear of current or ex partner: Not on file    Emotionally abused: Not on file    Physically abused: Not on file    Forced sexual activity: Not on file  Other Topics Concern  . Not on file  Social History Narrative  . Not on file     Review of  Systems  All other systems reviewed and are negative.      Objective:   Physical Exam  Constitutional: She appears well-developed.  Cardiovascular: Normal rate, regular rhythm, normal heart sounds and intact distal pulses. Exam reveals no gallop and no friction rub.  No murmur heard. Pulmonary/Chest: Effort normal and breath sounds normal. No stridor. No respiratory distress. She has no wheezes. She has no rales.  Abdominal: Soft. Bowel sounds are normal.  Musculoskeletal: She exhibits no edema.  Vitals reviewed.         Assessment & Plan:  Cerebrovascular accident (CVA) due to thrombosis of precerebral artery ([place]) - Plan: CBC with Differential/Platelet, COMPLETE METABOLIC PANEL WITH GFR  Uncontrolled type 2 diabetes mellitus with stage 2 chronic kidney disease, with long-term current use of insulin (HCC)  Coronary artery disease involving native coronary artery of native heart without angina pectoris Continue to refrain from smoking.  Continue dual antiplatelet therapy for a total of 21 days and then switch to Plavix 75 mg daily indefinitely.  Blood pressure is well controlled.  However glycemic control is not acceptable.  Increase 70/30 to 22 units in the morning to better control her evening sugars and continue 12 units in the evening.  Recheck fasting blood sugars and 2-hour postprandial sugars in 1 week.  Discontinue pravastatin 80 mg a day and switch to Crestor 20 mg a day.  Patient cannot tolerate high intensity statin, I would try to get her covered for a PSK 9 inhibitor.  However based on this patient's insurance, she cannot even afford more effective long-acting insulin such as Lantus or Basaglar.  Therefore I am not certain how she would be able to afford a PSK 9 inhibitor.

## 2018-01-26 LAB — COMPLETE METABOLIC PANEL WITH GFR
AG Ratio: 1.5 (calc) (ref 1.0–2.5)
ALKALINE PHOSPHATASE (APISO): 82 U/L (ref 33–130)
ALT: 11 U/L (ref 6–29)
AST: 12 U/L (ref 10–35)
Albumin: 4 g/dL (ref 3.6–5.1)
BILIRUBIN TOTAL: 0.3 mg/dL (ref 0.2–1.2)
BUN / CREAT RATIO: 22 (calc) (ref 6–22)
BUN: 31 mg/dL — AB (ref 7–25)
CHLORIDE: 106 mmol/L (ref 98–110)
CO2: 24 mmol/L (ref 20–32)
Calcium: 9.8 mg/dL (ref 8.6–10.4)
Creat: 1.42 mg/dL — ABNORMAL HIGH (ref 0.50–0.99)
GFR, Est African American: 44 mL/min/{1.73_m2} — ABNORMAL LOW (ref >=60)
GFR, Est Non African American: 38 mL/min/{1.73_m2} — ABNORMAL LOW (ref >=60)
GLUCOSE: 210 mg/dL — AB (ref 65–99)
Globulin: 2.7 g/dL (calc) (ref 1.9–3.7)
Potassium: 4.4 mmol/L (ref 3.5–5.3)
Sodium: 142 mmol/L (ref 135–146)
Total Protein: 6.7 g/dL (ref 6.1–8.1)

## 2018-01-26 LAB — CBC WITH DIFFERENTIAL/PLATELET
BASOS ABS: 50 {cells}/uL (ref 0–200)
Basophils Relative: 0.9 %
EOS ABS: 67 {cells}/uL (ref 15–500)
Eosinophils Relative: 1.2 %
HCT: 39 % (ref 35.0–45.0)
HEMOGLOBIN: 12.9 g/dL (ref 11.7–15.5)
Lymphs Abs: 1512 cells/uL (ref 850–3900)
MCH: 27.2 pg (ref 27.0–33.0)
MCHC: 33.1 g/dL (ref 32.0–36.0)
MCV: 82.1 fL (ref 80.0–100.0)
MONOS PCT: 8.9 %
MPV: 12.3 fL (ref 7.5–12.5)
NEUTROS PCT: 62 %
Neutro Abs: 3472 cells/uL (ref 1500–7800)
PLATELETS: 205 10*3/uL (ref 140–400)
RBC: 4.75 10*6/uL (ref 3.80–5.10)
RDW: 15.1 % — AB (ref 11.0–15.0)
TOTAL LYMPHOCYTE: 27 %
WBC mixed population: 498 cells/uL (ref 200–950)
WBC: 5.6 10*3/uL (ref 3.8–10.8)

## 2018-01-29 DIAGNOSIS — H25813 Combined forms of age-related cataract, bilateral: Secondary | ICD-10-CM | POA: Diagnosis not present

## 2018-01-29 DIAGNOSIS — E119 Type 2 diabetes mellitus without complications: Secondary | ICD-10-CM | POA: Diagnosis not present

## 2018-01-29 LAB — HM DIABETES EYE EXAM

## 2018-01-30 DIAGNOSIS — I130 Hypertensive heart and chronic kidney disease with heart failure and stage 1 through stage 4 chronic kidney disease, or unspecified chronic kidney disease: Secondary | ICD-10-CM | POA: Diagnosis not present

## 2018-01-30 DIAGNOSIS — Z9181 History of falling: Secondary | ICD-10-CM | POA: Diagnosis not present

## 2018-01-30 DIAGNOSIS — Z7902 Long term (current) use of antithrombotics/antiplatelets: Secondary | ICD-10-CM | POA: Diagnosis not present

## 2018-01-30 DIAGNOSIS — N183 Chronic kidney disease, stage 3 (moderate): Secondary | ICD-10-CM | POA: Diagnosis not present

## 2018-01-30 DIAGNOSIS — I69398 Other sequelae of cerebral infarction: Secondary | ICD-10-CM | POA: Diagnosis not present

## 2018-01-30 DIAGNOSIS — I2510 Atherosclerotic heart disease of native coronary artery without angina pectoris: Secondary | ICD-10-CM | POA: Diagnosis not present

## 2018-01-30 DIAGNOSIS — I739 Peripheral vascular disease, unspecified: Secondary | ICD-10-CM | POA: Diagnosis not present

## 2018-01-30 DIAGNOSIS — I5032 Chronic diastolic (congestive) heart failure: Secondary | ICD-10-CM | POA: Diagnosis not present

## 2018-01-30 DIAGNOSIS — E1165 Type 2 diabetes mellitus with hyperglycemia: Secondary | ICD-10-CM | POA: Diagnosis not present

## 2018-01-30 DIAGNOSIS — Z7982 Long term (current) use of aspirin: Secondary | ICD-10-CM | POA: Diagnosis not present

## 2018-01-30 DIAGNOSIS — E1122 Type 2 diabetes mellitus with diabetic chronic kidney disease: Secondary | ICD-10-CM | POA: Diagnosis not present

## 2018-01-30 DIAGNOSIS — Z794 Long term (current) use of insulin: Secondary | ICD-10-CM | POA: Diagnosis not present

## 2018-01-31 DIAGNOSIS — I739 Peripheral vascular disease, unspecified: Secondary | ICD-10-CM | POA: Diagnosis not present

## 2018-01-31 DIAGNOSIS — I2510 Atherosclerotic heart disease of native coronary artery without angina pectoris: Secondary | ICD-10-CM | POA: Diagnosis not present

## 2018-01-31 DIAGNOSIS — N183 Chronic kidney disease, stage 3 (moderate): Secondary | ICD-10-CM | POA: Diagnosis not present

## 2018-01-31 DIAGNOSIS — Z794 Long term (current) use of insulin: Secondary | ICD-10-CM | POA: Diagnosis not present

## 2018-01-31 DIAGNOSIS — I5032 Chronic diastolic (congestive) heart failure: Secondary | ICD-10-CM | POA: Diagnosis not present

## 2018-01-31 DIAGNOSIS — Z7982 Long term (current) use of aspirin: Secondary | ICD-10-CM | POA: Diagnosis not present

## 2018-01-31 DIAGNOSIS — E1165 Type 2 diabetes mellitus with hyperglycemia: Secondary | ICD-10-CM | POA: Diagnosis not present

## 2018-01-31 DIAGNOSIS — E1122 Type 2 diabetes mellitus with diabetic chronic kidney disease: Secondary | ICD-10-CM | POA: Diagnosis not present

## 2018-01-31 DIAGNOSIS — Z9181 History of falling: Secondary | ICD-10-CM | POA: Diagnosis not present

## 2018-01-31 DIAGNOSIS — I69398 Other sequelae of cerebral infarction: Secondary | ICD-10-CM | POA: Diagnosis not present

## 2018-01-31 DIAGNOSIS — Z7902 Long term (current) use of antithrombotics/antiplatelets: Secondary | ICD-10-CM | POA: Diagnosis not present

## 2018-01-31 DIAGNOSIS — I130 Hypertensive heart and chronic kidney disease with heart failure and stage 1 through stage 4 chronic kidney disease, or unspecified chronic kidney disease: Secondary | ICD-10-CM | POA: Diagnosis not present

## 2018-02-05 DIAGNOSIS — I2510 Atherosclerotic heart disease of native coronary artery without angina pectoris: Secondary | ICD-10-CM | POA: Diagnosis not present

## 2018-02-05 DIAGNOSIS — N183 Chronic kidney disease, stage 3 (moderate): Secondary | ICD-10-CM | POA: Diagnosis not present

## 2018-02-05 DIAGNOSIS — E1122 Type 2 diabetes mellitus with diabetic chronic kidney disease: Secondary | ICD-10-CM | POA: Diagnosis not present

## 2018-02-05 DIAGNOSIS — Z9181 History of falling: Secondary | ICD-10-CM | POA: Diagnosis not present

## 2018-02-05 DIAGNOSIS — E1165 Type 2 diabetes mellitus with hyperglycemia: Secondary | ICD-10-CM | POA: Diagnosis not present

## 2018-02-05 DIAGNOSIS — I69398 Other sequelae of cerebral infarction: Secondary | ICD-10-CM | POA: Diagnosis not present

## 2018-02-05 DIAGNOSIS — Z7902 Long term (current) use of antithrombotics/antiplatelets: Secondary | ICD-10-CM | POA: Diagnosis not present

## 2018-02-05 DIAGNOSIS — I739 Peripheral vascular disease, unspecified: Secondary | ICD-10-CM | POA: Diagnosis not present

## 2018-02-05 DIAGNOSIS — Z794 Long term (current) use of insulin: Secondary | ICD-10-CM | POA: Diagnosis not present

## 2018-02-05 DIAGNOSIS — I5032 Chronic diastolic (congestive) heart failure: Secondary | ICD-10-CM | POA: Diagnosis not present

## 2018-02-05 DIAGNOSIS — Z7982 Long term (current) use of aspirin: Secondary | ICD-10-CM | POA: Diagnosis not present

## 2018-02-05 DIAGNOSIS — I130 Hypertensive heart and chronic kidney disease with heart failure and stage 1 through stage 4 chronic kidney disease, or unspecified chronic kidney disease: Secondary | ICD-10-CM | POA: Diagnosis not present

## 2018-02-08 DIAGNOSIS — Z7982 Long term (current) use of aspirin: Secondary | ICD-10-CM | POA: Diagnosis not present

## 2018-02-08 DIAGNOSIS — I2510 Atherosclerotic heart disease of native coronary artery without angina pectoris: Secondary | ICD-10-CM | POA: Diagnosis not present

## 2018-02-08 DIAGNOSIS — I5032 Chronic diastolic (congestive) heart failure: Secondary | ICD-10-CM | POA: Diagnosis not present

## 2018-02-08 DIAGNOSIS — I739 Peripheral vascular disease, unspecified: Secondary | ICD-10-CM | POA: Diagnosis not present

## 2018-02-08 DIAGNOSIS — Z9181 History of falling: Secondary | ICD-10-CM | POA: Diagnosis not present

## 2018-02-08 DIAGNOSIS — E1165 Type 2 diabetes mellitus with hyperglycemia: Secondary | ICD-10-CM | POA: Diagnosis not present

## 2018-02-08 DIAGNOSIS — E1122 Type 2 diabetes mellitus with diabetic chronic kidney disease: Secondary | ICD-10-CM | POA: Diagnosis not present

## 2018-02-08 DIAGNOSIS — N183 Chronic kidney disease, stage 3 (moderate): Secondary | ICD-10-CM | POA: Diagnosis not present

## 2018-02-08 DIAGNOSIS — Z794 Long term (current) use of insulin: Secondary | ICD-10-CM | POA: Diagnosis not present

## 2018-02-08 DIAGNOSIS — Z7902 Long term (current) use of antithrombotics/antiplatelets: Secondary | ICD-10-CM | POA: Diagnosis not present

## 2018-02-08 DIAGNOSIS — I69398 Other sequelae of cerebral infarction: Secondary | ICD-10-CM | POA: Diagnosis not present

## 2018-02-08 DIAGNOSIS — I130 Hypertensive heart and chronic kidney disease with heart failure and stage 1 through stage 4 chronic kidney disease, or unspecified chronic kidney disease: Secondary | ICD-10-CM | POA: Diagnosis not present

## 2018-02-09 DIAGNOSIS — E1122 Type 2 diabetes mellitus with diabetic chronic kidney disease: Secondary | ICD-10-CM | POA: Diagnosis not present

## 2018-02-09 DIAGNOSIS — I739 Peripheral vascular disease, unspecified: Secondary | ICD-10-CM | POA: Diagnosis not present

## 2018-02-09 DIAGNOSIS — E1165 Type 2 diabetes mellitus with hyperglycemia: Secondary | ICD-10-CM | POA: Diagnosis not present

## 2018-02-09 DIAGNOSIS — I69398 Other sequelae of cerebral infarction: Secondary | ICD-10-CM | POA: Diagnosis not present

## 2018-02-09 DIAGNOSIS — Z794 Long term (current) use of insulin: Secondary | ICD-10-CM | POA: Diagnosis not present

## 2018-02-09 DIAGNOSIS — I130 Hypertensive heart and chronic kidney disease with heart failure and stage 1 through stage 4 chronic kidney disease, or unspecified chronic kidney disease: Secondary | ICD-10-CM | POA: Diagnosis not present

## 2018-02-09 DIAGNOSIS — Z7902 Long term (current) use of antithrombotics/antiplatelets: Secondary | ICD-10-CM | POA: Diagnosis not present

## 2018-02-09 DIAGNOSIS — I5032 Chronic diastolic (congestive) heart failure: Secondary | ICD-10-CM | POA: Diagnosis not present

## 2018-02-09 DIAGNOSIS — Z7982 Long term (current) use of aspirin: Secondary | ICD-10-CM | POA: Diagnosis not present

## 2018-02-09 DIAGNOSIS — Z9181 History of falling: Secondary | ICD-10-CM | POA: Diagnosis not present

## 2018-02-09 DIAGNOSIS — I2510 Atherosclerotic heart disease of native coronary artery without angina pectoris: Secondary | ICD-10-CM | POA: Diagnosis not present

## 2018-02-09 DIAGNOSIS — N183 Chronic kidney disease, stage 3 (moderate): Secondary | ICD-10-CM | POA: Diagnosis not present

## 2018-02-12 DIAGNOSIS — N183 Chronic kidney disease, stage 3 (moderate): Secondary | ICD-10-CM | POA: Diagnosis not present

## 2018-02-12 DIAGNOSIS — E1165 Type 2 diabetes mellitus with hyperglycemia: Secondary | ICD-10-CM | POA: Diagnosis not present

## 2018-02-12 DIAGNOSIS — I739 Peripheral vascular disease, unspecified: Secondary | ICD-10-CM | POA: Diagnosis not present

## 2018-02-12 DIAGNOSIS — E1122 Type 2 diabetes mellitus with diabetic chronic kidney disease: Secondary | ICD-10-CM | POA: Diagnosis not present

## 2018-02-12 DIAGNOSIS — I5032 Chronic diastolic (congestive) heart failure: Secondary | ICD-10-CM | POA: Diagnosis not present

## 2018-02-12 DIAGNOSIS — I130 Hypertensive heart and chronic kidney disease with heart failure and stage 1 through stage 4 chronic kidney disease, or unspecified chronic kidney disease: Secondary | ICD-10-CM | POA: Diagnosis not present

## 2018-02-12 DIAGNOSIS — Z9181 History of falling: Secondary | ICD-10-CM | POA: Diagnosis not present

## 2018-02-12 DIAGNOSIS — Z794 Long term (current) use of insulin: Secondary | ICD-10-CM | POA: Diagnosis not present

## 2018-02-12 DIAGNOSIS — Z7982 Long term (current) use of aspirin: Secondary | ICD-10-CM | POA: Diagnosis not present

## 2018-02-12 DIAGNOSIS — Z7902 Long term (current) use of antithrombotics/antiplatelets: Secondary | ICD-10-CM | POA: Diagnosis not present

## 2018-02-12 DIAGNOSIS — I2510 Atherosclerotic heart disease of native coronary artery without angina pectoris: Secondary | ICD-10-CM | POA: Diagnosis not present

## 2018-02-12 DIAGNOSIS — I69398 Other sequelae of cerebral infarction: Secondary | ICD-10-CM | POA: Diagnosis not present

## 2018-02-13 ENCOUNTER — Other Ambulatory Visit: Payer: Self-pay | Admitting: Family Medicine

## 2018-02-13 DIAGNOSIS — I739 Peripheral vascular disease, unspecified: Secondary | ICD-10-CM | POA: Diagnosis not present

## 2018-02-13 DIAGNOSIS — I69398 Other sequelae of cerebral infarction: Secondary | ICD-10-CM | POA: Diagnosis not present

## 2018-02-13 DIAGNOSIS — I130 Hypertensive heart and chronic kidney disease with heart failure and stage 1 through stage 4 chronic kidney disease, or unspecified chronic kidney disease: Secondary | ICD-10-CM | POA: Diagnosis not present

## 2018-02-13 DIAGNOSIS — Z7902 Long term (current) use of antithrombotics/antiplatelets: Secondary | ICD-10-CM | POA: Diagnosis not present

## 2018-02-13 DIAGNOSIS — E1165 Type 2 diabetes mellitus with hyperglycemia: Secondary | ICD-10-CM | POA: Diagnosis not present

## 2018-02-13 DIAGNOSIS — Z7982 Long term (current) use of aspirin: Secondary | ICD-10-CM | POA: Diagnosis not present

## 2018-02-13 DIAGNOSIS — E1122 Type 2 diabetes mellitus with diabetic chronic kidney disease: Secondary | ICD-10-CM | POA: Diagnosis not present

## 2018-02-13 DIAGNOSIS — Z794 Long term (current) use of insulin: Secondary | ICD-10-CM | POA: Diagnosis not present

## 2018-02-13 DIAGNOSIS — I2510 Atherosclerotic heart disease of native coronary artery without angina pectoris: Secondary | ICD-10-CM | POA: Diagnosis not present

## 2018-02-13 DIAGNOSIS — I5032 Chronic diastolic (congestive) heart failure: Secondary | ICD-10-CM | POA: Diagnosis not present

## 2018-02-13 DIAGNOSIS — N183 Chronic kidney disease, stage 3 (moderate): Secondary | ICD-10-CM | POA: Diagnosis not present

## 2018-02-13 DIAGNOSIS — Z9181 History of falling: Secondary | ICD-10-CM | POA: Diagnosis not present

## 2018-02-14 DIAGNOSIS — I5032 Chronic diastolic (congestive) heart failure: Secondary | ICD-10-CM | POA: Diagnosis not present

## 2018-02-14 DIAGNOSIS — N183 Chronic kidney disease, stage 3 (moderate): Secondary | ICD-10-CM | POA: Diagnosis not present

## 2018-02-14 DIAGNOSIS — I739 Peripheral vascular disease, unspecified: Secondary | ICD-10-CM | POA: Diagnosis not present

## 2018-02-14 DIAGNOSIS — E1122 Type 2 diabetes mellitus with diabetic chronic kidney disease: Secondary | ICD-10-CM | POA: Diagnosis not present

## 2018-02-14 DIAGNOSIS — Z794 Long term (current) use of insulin: Secondary | ICD-10-CM | POA: Diagnosis not present

## 2018-02-14 DIAGNOSIS — E1165 Type 2 diabetes mellitus with hyperglycemia: Secondary | ICD-10-CM | POA: Diagnosis not present

## 2018-02-14 DIAGNOSIS — I130 Hypertensive heart and chronic kidney disease with heart failure and stage 1 through stage 4 chronic kidney disease, or unspecified chronic kidney disease: Secondary | ICD-10-CM | POA: Diagnosis not present

## 2018-02-14 DIAGNOSIS — Z7982 Long term (current) use of aspirin: Secondary | ICD-10-CM | POA: Diagnosis not present

## 2018-02-14 DIAGNOSIS — I69398 Other sequelae of cerebral infarction: Secondary | ICD-10-CM | POA: Diagnosis not present

## 2018-02-14 DIAGNOSIS — I2510 Atherosclerotic heart disease of native coronary artery without angina pectoris: Secondary | ICD-10-CM | POA: Diagnosis not present

## 2018-02-14 DIAGNOSIS — Z9181 History of falling: Secondary | ICD-10-CM | POA: Diagnosis not present

## 2018-02-14 DIAGNOSIS — Z7902 Long term (current) use of antithrombotics/antiplatelets: Secondary | ICD-10-CM | POA: Diagnosis not present

## 2018-02-15 DIAGNOSIS — I2510 Atherosclerotic heart disease of native coronary artery without angina pectoris: Secondary | ICD-10-CM | POA: Diagnosis not present

## 2018-02-15 DIAGNOSIS — I5032 Chronic diastolic (congestive) heart failure: Secondary | ICD-10-CM | POA: Diagnosis not present

## 2018-02-15 DIAGNOSIS — I130 Hypertensive heart and chronic kidney disease with heart failure and stage 1 through stage 4 chronic kidney disease, or unspecified chronic kidney disease: Secondary | ICD-10-CM | POA: Diagnosis not present

## 2018-02-15 DIAGNOSIS — Z7902 Long term (current) use of antithrombotics/antiplatelets: Secondary | ICD-10-CM | POA: Diagnosis not present

## 2018-02-15 DIAGNOSIS — E1122 Type 2 diabetes mellitus with diabetic chronic kidney disease: Secondary | ICD-10-CM | POA: Diagnosis not present

## 2018-02-15 DIAGNOSIS — Z9181 History of falling: Secondary | ICD-10-CM | POA: Diagnosis not present

## 2018-02-15 DIAGNOSIS — Z7982 Long term (current) use of aspirin: Secondary | ICD-10-CM | POA: Diagnosis not present

## 2018-02-15 DIAGNOSIS — E1165 Type 2 diabetes mellitus with hyperglycemia: Secondary | ICD-10-CM | POA: Diagnosis not present

## 2018-02-15 DIAGNOSIS — N183 Chronic kidney disease, stage 3 (moderate): Secondary | ICD-10-CM | POA: Diagnosis not present

## 2018-02-15 DIAGNOSIS — Z794 Long term (current) use of insulin: Secondary | ICD-10-CM | POA: Diagnosis not present

## 2018-02-15 DIAGNOSIS — I69398 Other sequelae of cerebral infarction: Secondary | ICD-10-CM | POA: Diagnosis not present

## 2018-02-15 DIAGNOSIS — I739 Peripheral vascular disease, unspecified: Secondary | ICD-10-CM | POA: Diagnosis not present

## 2018-02-19 ENCOUNTER — Other Ambulatory Visit: Payer: Self-pay | Admitting: Family Medicine

## 2018-02-20 DIAGNOSIS — E1122 Type 2 diabetes mellitus with diabetic chronic kidney disease: Secondary | ICD-10-CM | POA: Diagnosis not present

## 2018-02-20 DIAGNOSIS — I5032 Chronic diastolic (congestive) heart failure: Secondary | ICD-10-CM | POA: Diagnosis not present

## 2018-02-20 DIAGNOSIS — I69398 Other sequelae of cerebral infarction: Secondary | ICD-10-CM | POA: Diagnosis not present

## 2018-02-20 DIAGNOSIS — Z9181 History of falling: Secondary | ICD-10-CM | POA: Diagnosis not present

## 2018-02-20 DIAGNOSIS — Z7902 Long term (current) use of antithrombotics/antiplatelets: Secondary | ICD-10-CM | POA: Diagnosis not present

## 2018-02-20 DIAGNOSIS — Z794 Long term (current) use of insulin: Secondary | ICD-10-CM | POA: Diagnosis not present

## 2018-02-20 DIAGNOSIS — Z7982 Long term (current) use of aspirin: Secondary | ICD-10-CM | POA: Diagnosis not present

## 2018-02-20 DIAGNOSIS — N183 Chronic kidney disease, stage 3 (moderate): Secondary | ICD-10-CM | POA: Diagnosis not present

## 2018-02-20 DIAGNOSIS — I130 Hypertensive heart and chronic kidney disease with heart failure and stage 1 through stage 4 chronic kidney disease, or unspecified chronic kidney disease: Secondary | ICD-10-CM | POA: Diagnosis not present

## 2018-02-20 DIAGNOSIS — I2510 Atherosclerotic heart disease of native coronary artery without angina pectoris: Secondary | ICD-10-CM | POA: Diagnosis not present

## 2018-02-20 DIAGNOSIS — I739 Peripheral vascular disease, unspecified: Secondary | ICD-10-CM | POA: Diagnosis not present

## 2018-02-20 DIAGNOSIS — E1165 Type 2 diabetes mellitus with hyperglycemia: Secondary | ICD-10-CM | POA: Diagnosis not present

## 2018-02-21 ENCOUNTER — Ambulatory Visit: Payer: Medicare Other | Admitting: Adult Health

## 2018-02-21 ENCOUNTER — Encounter: Payer: Self-pay | Admitting: Adult Health

## 2018-02-21 VITALS — BP 142/63 | HR 65 | Ht 64.0 in | Wt 190.4 lb

## 2018-02-21 DIAGNOSIS — E785 Hyperlipidemia, unspecified: Secondary | ICD-10-CM

## 2018-02-21 DIAGNOSIS — E119 Type 2 diabetes mellitus without complications: Secondary | ICD-10-CM

## 2018-02-21 DIAGNOSIS — I6300 Cerebral infarction due to thrombosis of unspecified precerebral artery: Secondary | ICD-10-CM | POA: Diagnosis not present

## 2018-02-21 DIAGNOSIS — I10 Essential (primary) hypertension: Secondary | ICD-10-CM | POA: Diagnosis not present

## 2018-02-21 DIAGNOSIS — Z794 Long term (current) use of insulin: Secondary | ICD-10-CM | POA: Diagnosis not present

## 2018-02-21 MED ORDER — ROSUVASTATIN CALCIUM 20 MG PO TABS: 20.0000 mg | ORAL_TABLET | Freq: Every day | ORAL | 3 refills | Status: DC

## 2018-02-21 NOTE — Progress Notes (Signed)
[facility] [address] [place]. [place] [address] [phone]       OFFICE FOLLOW UP NOTE  Ms. TIARAH SHISLER Date of Birth:  [birth year] Medical Record Number:  [id number]   Reason for Referral:  hospital stroke follow up  CHIEF COMPLAINT:  Chief Complaint  Patient presents with  . 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 History obtained from patient and chart review. Reviewed all radiology images and labs personally.  Ms. TENLEY WINWARD is a 72 y.o. female with history of HTN, HLD w/ statin intolerance and uncontrolled DB presenting with gait instability. Pt though r/t to statin as she has had myalgias on statins in past and was started on pravachol 80 mg 2 weeks ago.  CT head showed no acute abnormality.  CTA head and neck showed mild stenosis anterior and posterior circulation distal left ICA.  CTA of neck showed no sign of stenosis.  MRI head showed right paramedial midbrain subcentimeter infarct along with old left cerebellar, upper pontine, right WIC, thalamic infarcts along with small vessel disease and atrophy.  2D echo showed EF of 60 to 65% with severe calcification AV concerning for thrombus.  TEE performed was negative for LA mass or thrombus.  LDL 95 and recommended to continue [name] 80 mg and PCP to continue to pursue insurance approval of the [place] possible consider [place].  A1c elevated at 8.9 and recommended tight glycemic control with close PCP follow-up.  As patient was on aspirin 81 mg prior it was recommended aspirin 81 and Plavix for 3 weeks and then Plavix alone.  Therapy recommended home health PT/OT.  Patient is being seen today for hospital follow-up and is accompanied by her husband.  She continues to participate in physical therapy but possibly ending today or tomorrow and states all her previous symptoms have  resolved.  She continues to take Plavix only with mild bruising but no bleeding.  PCP recently started patient on Crestor which she is tolerating well at this time but PCP is continuing to work on statin alternatives due to sensitivity.  Blood pressure today mildly elevated at 142/63 but patient does monitor this at home and states is typically lower.  She has been also having a difficult time managing glucose levels as they have been low in the morning and elevated in the evening but during this past week blood sugars in the evening have been less than 304 previously there was greater than 300.  She is following with her PCP in regards to diabetic management.  She has returned to all previous activities without complications.  Denies new or worsening stroke/TIA symptoms.  ROS:   14 system review of systems performed and negative with exception of leg swelling, cold intolerance, diarrhea, back pain, aching muscles, bruise, and weakness  PMH:  Past Medical History:  Diagnosis Date  . Arthritis   . CAD (coronary artery disease)    S/P cabg  . Carotid artery occlusion   . Cataract   . CHF (congestive heart failure) ([place])   . DDD (degenerative disc disease), lumbar   . Diabetes mellitus   . Hyperlipidemia   . Hypertension   . Joint pain   . Leg pain   . Myocardial infarction ([place]) 1998  . Peripheral vascular disease ([place])   . PVD (peripheral  vascular disease) ([place])   . Stroke [facility])     PSH:  Past Surgical History:  Procedure Laterality Date  . APPENDECTOMY    . BREAST EXCISIONAL BIOPSY Left 2008  . CHOLECYSTECTOMY     Gall bladder  . COLONOSCOPY  2008  . CORONARY ARTERY BYPASS GRAFT     1998  . LEFT HEART CATH AND CORS/GRAFTS ANGIOGRAPHY N/A 12/22/2017   Procedure: LEFT HEART CATH AND CORS/GRAFTS ANGIOGRAPHY;  Surgeon: Burnell Blanks, MD;  Location: [facility];  Service: Cardiovascular;  Laterality: N/A;  . PR VEIN BYPASS GRAFT,AORTO-FEM-POP    . TEE WITHOUT  CARDIOVERSION N/A 01/19/2018   Procedure: TRANSESOPHAGEAL ECHOCARDIOGRAM (TEE);  Surgeon: Josue Hector, MD;  Location: [facility] ENDOSCOPY;  Service: Cardiovascular;  Laterality: N/A;  . TUBAL LIGATION      Social History:  Social History   Socioeconomic History  . Marital status: Divorced    Spouse name: Not on file  . Number of children: Not on file  . Years of education: Not on file  . Highest education level: Not on file  Occupational History  . Not on file  Social Needs  . Financial resource strain: Not on file  . Food insecurity:    Worry: Not on file    Inability: Not on file  . Transportation needs:    Medical: Not on file    Non-medical: Not on file  Tobacco Use  . Smoking status: Former Smoker    Packs/day: 0.25    Types: Cigarettes    Last attempt to quit: 09/13/2011    Years since quitting: 6.4  . Smokeless tobacco: Never Used  Substance and Sexual Activity  . Alcohol use: No  . Drug use: No  . Sexual activity: Not on file  Lifestyle  . Physical activity:    Days per week: Not on file    Minutes per session: Not on file  . Stress: Not on file  Relationships  . Social connections:    Talks on phone: Not on file    Gets together: Not on file    Attends religious service: Not on file    Active member of club or organization: Not on file    Attends meetings of clubs or organizations: Not on file    Relationship status: Not on file  . Intimate partner violence:    Fear of current or ex partner: Not on file    Emotionally abused: Not on file    Physically abused: Not on file    Forced sexual activity: Not on file  Other Topics Concern  . Not on file  Social History Narrative  . Not on file    Family History:  Family History  Problem Relation Age of Onset  . Heart disease Mother   . Diabetes Mother   . Hyperlipidemia Mother   . Hypertension Mother   . Stroke Mother   . Heart disease Father        Heart Disease before age 46  . Diabetes Father   .  Hyperlipidemia Father   . Hypertension Father   . Heart disease Sister        heart attack  . Diabetes Sister        Amputation  . Hypertension Sister   . Other Sister        history of amputation  . Heart attack Sister   . Diabetes Brother   . Hypertension Brother   . Heart disease Brother 11  Before age 92  . Hyperlipidemia Brother   . Diabetes Brother        scirrosis of liver  . Coronary artery disease Other   . Colon cancer Neg Hx   . Stomach cancer Neg Hx   . Esophageal cancer Neg Hx     Medications:   Current Outpatient Medications on File Prior to Visit  Medication Sig Dispense Refill  . amLODipine (NORVASC) 5 MG tablet TAKE 1 TABLET BY MOUTH  DAILY 90 tablet 1  . CINNAMON PO Take 1,000 mg by mouth 2 (two) times daily.      . clopidogrel (PLAVIX) 75 MG tablet TAKE 1 TABLET BY MOUTH EVERY DAY 90 tablet 3  . Coenzyme Q10 (CO Q 10 PO) Take 100 mg by mouth daily.      . fluticasone (FLONASE) 50 MCG/ACT nasal spray Place 2 sprays into both nostrils daily as needed for allergies.    . furosemide (LASIX) 20 MG tablet TAKE 1 TABLET BY MOUTH  DAILY 90 tablet 3  . furosemide (LASIX) 40 MG tablet Take 20 mg by mouth daily. Patient taking 0.5 tablets po qd.    . Insulin Isophane & Regular Human (HUMULIN 70/30 KWIKPEN) (70-30) 100 UNIT/ML PEN 15 units with breakfast and 7 units with supper 15 mL 11  . metFORMIN (GLUCOPHAGE) 1000 MG tablet TAKE 1 TABLET BY MOUTH  TWICE A DAY WITH MEALS 180 tablet 1  . nitroGLYCERIN (NITROSTAT) 0.4 MG SL tablet Place 1 tablet (0.4 mg total) under the tongue every 5 (five) minutes as needed for chest pain. 90 tablet 2  . pioglitazone (ACTOS) 30 MG tablet TAKE 1 TABLET BY MOUTH  DAILY 90 tablet 1  . potassium chloride (K-DUR) 10 MEQ tablet Take 1 tablet (10 mEq total) by mouth daily. 30 tablet 11  . quinapril (ACCUPRIL) 40 MG tablet TAKE 1 TABLET BY MOUTH TWO  TIMES DAILY 180 tablet 3  . traMADol (ULTRAM) 50 MG tablet Take 1 tablet (50 mg total) by  mouth every 8 (eight) hours as needed. 180 tablet 0   No current facility-administered medications on file prior to visit.     Allergies:   Allergies  Allergen Reactions  . Levaquin [Levofloxacin In D5w] Other (See Comments)    Pain all over and in joints  . Statins Other (See Comments)    Severe myalgias to lipitor, crestor, pravastatin and weakness and cramping  in bilateral leg.  [name] [Linagliptin] Itching     Physical Exam  Vitals:   02/21/18 1056  BP: (!) 142/63  Pulse: 65  Weight: 190 lb 6.4 oz (86.4 kg)  Height: 5\' 4"  (1.626 m)   Body mass index is 32.68 kg/m. No exam data present  General: well developed, well nourished, pleasant elderly female, seated, in no evident distress Head: head normocephalic and atraumatic.   Neck: supple with no carotid or supraclavicular bruits Cardiovascular: regular rate and rhythm, no murmurs Musculoskeletal: no deformity Skin:  no rash/petichiae Vascular:  Normal pulses all extremities  Neurologic Exam Mental Status: Awake and fully alert. Oriented to place and time. Recent and remote memory intact. Attention span, concentration and fund of knowledge appropriate. Mood and affect appropriate.  Cranial Nerves: Fundoscopic exam reveals sharp disc margins. Pupils equal, briskly reactive to light. Extraocular movements full without nystagmus. Visual fields full to confrontation. Hearing intact. Facial sensation intact. Face, tongue, palate moves normally and symmetrically.  Motor: Normal bulk and tone. Normal strength in all tested extremity muscles. Sensory.: intact to  touch , pinprick , position and vibratory sensation.  Coordination: Rapid alternating movements normal in all extremities. Finger-to-nose and heel-to-shin performed accurately bilaterally. Gait and Station: Arises from chair without difficulty. Stance is normal. Gait demonstrates normal stride length and balance . Able to heel, toe and tandem walk without difficulty.   Reflexes: 1+ and symmetric. Toes downgoing.    NIHSS  0 Modified Rankin  1   Diagnostic Data (Labs, Imaging, Testing)  CT head wo contrast CT angio head/neck w or wo contrast 01/16/2018 IMPRESSION: CT head: 1. No acute intracranial abnormality identified. 2. Stable chronic microvascular ischemic changes and parenchymal volume loss of the brain. CTA neck: 1. Patent carotid and vertebral arteries. No significant stenosis by NASCET criteria, dissection, or occlusion. 2. Calcified plaque bilateral vertebral artery origins with mild less than 50% stenosis. 3. Calcified plaque of left subclavian artery origin, partially obscured by streak artifact, approximately 50% mild-to-moderate stenosis. CTA head: 1. Patent anterior and posterior intracranial circulation. No high-grade stenosis, aneurysm, large vessel occlusion, or vascular malformation. 2. Multiple segments of mild stenosis in the anterior and posterior circulations as well as mild-to-moderate distal left cavernous ICA Stenosis.  MR brain wo contrast 01/16/2018 IMPRESSION: 1. Subcentimeter focus of acute/early subacute infarction extending from the right paramedian midbrain the right superior cerebellar peduncle. No associated hemorrhage or mass effect. 2. Small chronic infarctions are present within the left cerebellar hemisphere, upper pons, right genu of internal capsule, thalamus. 3. Moderate for age chronic microvascular ischemic changes and parenchymal volume loss of the brain.   2D Echocardiogram  - Left ventricle: The cavity size was normal. Systolic function wasnormal. The estimated ejection fraction was in the range of 60%to 65%. Wall motion was normal; there were no regional wallmotion abnormalities. The study is not technically sufficient toallow evaluation of LV diastolic function. - Aortic valve: Severe focal calcification involving thenoncoronary cusp. - Mitral valve: There was trivial  regurgitation. - Left atrium: There is a 3.2 x 1.07 oblong density in the LA thatcould represent thrombus. Recommend TEE for further delineation. - Atrial septum: There was increased thickness of the septum,consistent with lipomatous hypertrophy. - Pulmonic valve: There was trivial regurgitation. - Pulmonary arteries: PA peak pressure: 39 mm Hg (S). Impressions:   Possible LA mass although only seen in the parasternal long axisview. Recommend TEE for further delineation. The rightventricular systolic pressure was increased consistent with mildpulmonary hypertension.  TEE EF 60% No LAA thrombus and no LA mass Mild to moderate MR AV sclerosis Normal aortic root with somewhat bulky calcific atherosclerosis in the arch Normal RV Negative bubble study No PFO No effusion      ASSESSMENT: HALAYNA BLANE is a 72 y.o. 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 Vascular risk factors include HTN, HLD and DM.     PLAN: -Continue clopidogrel 75 mg daily  and Crestor until PCP has preapproval for statin alternatives for secondary stroke prevention -F/u with PCP regarding your HLD, HLD and DM management -Continue current therapies -continue to monitor BP at home -Advised importance of eating healthy and stay active -Maintain strict control of hypertension with blood pressure goal below 130/90, diabetes with hemoglobin A1c goal below 6.5% and cholesterol with LDL cholesterol (bad cholesterol) goal below 70 mg/dL. I also advised the patient to eat a healthy diet with plenty of whole grains, cereals, fruits and vegetables, exercise regularly and maintain ideal body weight.  Follow up in 3 months or call earlier if  needed   Greater than 50% of time during this 25 minute visit was spent on counseling,explanation of diagnosis of right paramedian pontine infarct, reviewing risk factor management of HLD, HTN and DM, planning of  further management, discussion with patient and family and coordination of care    Venancio Poisson, [facility]  [facility] [address] [place], [place] [address]  Phone [phone] Fax [phone]

## 2018-02-21 NOTE — Patient Instructions (Signed)
Continue clopidogrel 75 mg daily  and crestor  for secondary stroke prevention  Continue to follow up with PCP regarding cholesterol, blood pressure and diabetes management   continue to stay active and eat healthy  Continue therapies - if you need additional orders, please contact us and we can place them  Continue to monitor blood pressure at home  Maintain strict control of hypertension with blood pressure goal below 130/90, diabetes with hemoglobin A1c goal below 6.5% and cholesterol with LDL cholesterol (bad cholesterol) goal below 70 mg/dL. I also advised the patient to eat a healthy diet with plenty of whole grains, cereals, fruits and vegetables, exercise regularly and maintain ideal body weight.  Followup in the future with me in 3 months or call earlier if needed       Thank you for coming to see [place] at [facility]. I hope we have been able to provide you high quality care today.  You may receive a patient satisfaction survey over the next few weeks. We would appreciate your feedback and comments so that we may continue to improve ourselves and the health of our patients.

## 2018-02-22 NOTE — Progress Notes (Signed)
I agree with the above plan 

## 2018-02-23 DIAGNOSIS — E1165 Type 2 diabetes mellitus with hyperglycemia: Secondary | ICD-10-CM | POA: Diagnosis not present

## 2018-02-23 DIAGNOSIS — Z7982 Long term (current) use of aspirin: Secondary | ICD-10-CM | POA: Diagnosis not present

## 2018-02-23 DIAGNOSIS — I739 Peripheral vascular disease, unspecified: Secondary | ICD-10-CM | POA: Diagnosis not present

## 2018-02-23 DIAGNOSIS — Z794 Long term (current) use of insulin: Secondary | ICD-10-CM | POA: Diagnosis not present

## 2018-02-23 DIAGNOSIS — I5032 Chronic diastolic (congestive) heart failure: Secondary | ICD-10-CM | POA: Diagnosis not present

## 2018-02-23 DIAGNOSIS — I2510 Atherosclerotic heart disease of native coronary artery without angina pectoris: Secondary | ICD-10-CM | POA: Diagnosis not present

## 2018-02-23 DIAGNOSIS — I69398 Other sequelae of cerebral infarction: Secondary | ICD-10-CM | POA: Diagnosis not present

## 2018-02-23 DIAGNOSIS — E1122 Type 2 diabetes mellitus with diabetic chronic kidney disease: Secondary | ICD-10-CM | POA: Diagnosis not present

## 2018-02-23 DIAGNOSIS — I130 Hypertensive heart and chronic kidney disease with heart failure and stage 1 through stage 4 chronic kidney disease, or unspecified chronic kidney disease: Secondary | ICD-10-CM | POA: Diagnosis not present

## 2018-02-23 DIAGNOSIS — Z9181 History of falling: Secondary | ICD-10-CM | POA: Diagnosis not present

## 2018-02-23 DIAGNOSIS — N183 Chronic kidney disease, stage 3 (moderate): Secondary | ICD-10-CM | POA: Diagnosis not present

## 2018-02-23 DIAGNOSIS — Z7902 Long term (current) use of antithrombotics/antiplatelets: Secondary | ICD-10-CM | POA: Diagnosis not present

## 2018-02-28 ENCOUNTER — Encounter: Payer: Self-pay | Admitting: *Deleted

## 2018-03-05 ENCOUNTER — Other Ambulatory Visit: Payer: Self-pay | Admitting: *Deleted

## 2018-03-05 DIAGNOSIS — Z794 Long term (current) use of insulin: Secondary | ICD-10-CM

## 2018-03-05 DIAGNOSIS — E119 Type 2 diabetes mellitus without complications: Secondary | ICD-10-CM

## 2018-03-05 DIAGNOSIS — E782 Mixed hyperlipidemia: Secondary | ICD-10-CM

## 2018-03-05 LAB — LIPID PANEL
CHOL/HDL RATIO: 2.4 ratio (ref 0.0–4.4)
CHOLESTEROL TOTAL: 137 mg/dL (ref 100–199)
HDL: 58 mg/dL (ref >39)
LDL Calculated: 58 mg/dL (ref 0–99)
Triglycerides: 107 mg/dL (ref 0–149)
VLDL Cholesterol Cal: 21 mg/dL (ref 5–40)

## 2018-03-05 LAB — HEPATIC FUNCTION PANEL
ALBUMIN: 4 g/dL (ref 3.5–4.8)
ALT: 11 IU/L (ref 0–32)
AST: 14 IU/L (ref 0–40)
Alkaline Phosphatase: 82 IU/L (ref 39–117)
Bilirubin Total: 0.5 mg/dL (ref 0.0–1.2)
Bilirubin, Direct: 0.13 mg/dL (ref 0.00–0.40)
TOTAL PROTEIN: 6.6 g/dL (ref 6.0–8.5)

## 2018-03-06 ENCOUNTER — Ambulatory Visit: Payer: Medicare Other | Admitting: Family Medicine

## 2018-05-08 ENCOUNTER — Telehealth: Payer: Self-pay | Admitting: Cardiovascular Disease

## 2018-05-08 NOTE — Telephone Encounter (Signed)
Left message with [facility] to send Ef% form over.

## 2018-05-08 NOTE — Telephone Encounter (Signed)
° °  1) Are you calling to confirm a diagnosis or obtain personal health information (Y/N)? YES  2) If so, what information is requested? EF%, echo, vitals from last Brenda Moreno @ [facility] - [phone] EXT [phone] Fax [phone]  Please route to Medical Records or your medical records site representative

## 2018-05-10 ENCOUNTER — Encounter: Payer: Self-pay | Admitting: Physician Assistant

## 2018-05-10 ENCOUNTER — Ambulatory Visit (INDEPENDENT_AMBULATORY_CARE_PROVIDER_SITE_OTHER): Payer: Medicare Other | Admitting: Physician Assistant

## 2018-05-10 VITALS — BP 150/82 | HR 53 | Temp 98.1°F | Resp 18 | Ht 66.0 in | Wt 186.6 lb

## 2018-05-10 DIAGNOSIS — L0291 Cutaneous abscess, unspecified: Secondary | ICD-10-CM

## 2018-05-10 MED ORDER — INSULIN ISOPHANE & REGULAR (HUMAN 70-30)100 UNIT/ML KWIKPEN: PEN_INJECTOR | SUBCUTANEOUS | 11 refills | Status: DC

## 2018-05-10 MED ORDER — SULFAMETHOXAZOLE-TRIMETHOPRIM 800-160 MG PO TABS: 1.0000 | ORAL_TABLET | Freq: Two times a day (BID) | ORAL | 0 refills | Status: DC

## 2018-05-10 NOTE — Progress Notes (Signed)
Patient ID: Brenda Moreno MRN: [id number], DOB: 24-Dec-1945, 72 y.o. Date of Encounter: 05/10/2018, 12:46 PM    Chief Complaint:  Chief Complaint  Patient presents with  . boil on buttock  . Medication Refill    humulin     HPI: 72 y.o. year old female presents with above.   She reports that  "it has been many years since she had a boil." States that she has been feeling this boil on her right buttock region "for about 2 weeks." Says that she "has squeezed out most all of it but still just a small spot there" came in.     Home Meds:   Outpatient Medications Prior to Visit  Medication Sig Dispense Refill  . amLODipine (NORVASC) 5 MG tablet TAKE 1 TABLET BY MOUTH  DAILY 90 tablet 1  . CINNAMON PO Take 1,000 mg by mouth 2 (two) times daily.      . clopidogrel (PLAVIX) 75 MG tablet TAKE 1 TABLET BY MOUTH EVERY DAY 90 tablet 3  . Coenzyme Q10 (CO Q 10 PO) Take 100 mg by mouth daily.      . fluticasone (FLONASE) 50 MCG/ACT nasal spray Place 2 sprays into both nostrils daily as needed for allergies.    . furosemide (LASIX) 20 MG tablet TAKE 1 TABLET BY MOUTH  DAILY 90 tablet 3  . furosemide (LASIX) 40 MG tablet Take 20 mg by mouth daily. Patient taking 0.5 tablets po qd.    . metFORMIN (GLUCOPHAGE) 1000 MG tablet TAKE 1 TABLET BY MOUTH  TWICE A DAY WITH MEALS 180 tablet 1  . nitroGLYCERIN (NITROSTAT) 0.4 MG SL tablet Place 1 tablet (0.4 mg total) under the tongue every 5 (five) minutes as needed for chest pain. 90 tablet 2  . pioglitazone (ACTOS) 30 MG tablet TAKE 1 TABLET BY MOUTH  DAILY 90 tablet 1  . quinapril (ACCUPRIL) 40 MG tablet TAKE 1 TABLET BY MOUTH TWO  TIMES DAILY 180 tablet 3  . rosuvastatin (CRESTOR) 20 MG tablet Take 1 tablet (20 mg total) by mouth daily. 90 tablet 3  . traMADol (ULTRAM) 50 MG tablet Take 1 tablet (50 mg total) by mouth every 8 (eight) hours as needed. 180 tablet 0  . Insulin Isophane & Regular Human (HUMULIN 70/30 KWIKPEN) (70-30) 100 UNIT/ML PEN 15  units with breakfast and 7 units with supper 15 mL 11  . potassium chloride (K-DUR) 10 MEQ tablet Take 1 tablet (10 mEq total) by mouth daily. 30 tablet 11   No facility-administered medications prior to visit.     Allergies:  Allergies  Allergen Reactions  . Levaquin [Levofloxacin In D5w] Other (See Comments)    Pain all over and in joints  . Statins Other (See Comments)    Severe myalgias to lipitor, crestor, pravastatin and weakness and cramping  in bilateral leg.  [name] [Linagliptin] Itching      Review of Systems: See HPI for pertinent ROS. All other ROS negative.    Physical Exam: Blood pressure (!) 150/82, pulse (!) 53, temperature 98.1 F (36.7 C), temperature source Oral, resp. rate 18, height 5\' 6"  (1.676 m), weight 84.6 kg, SpO2 98 %., Body mass index is 30.12 kg/m. General:  Appears in no acute distress.  Neck: Supple. No thyromegaly. No lymphadenopathy. Lungs: Clear bilaterally to auscultation without wheezes, rales, or rhonchi. Breathing is unlabored. Heart: Regular rhythm. No murmurs, rubs, or gallops. Msk:  Strength and tone normal for age. Skin:  Right Buttock-- Near "  crease" but on outer portion of "crease"---there is 0.5cm diameter area that is "opening" that has been draining but currently is sealed over.  Underlying this--there is ~ 0.5 inch diameter area of mild firmness.  Neuro: Alert and oriented X 3. Moves all extremities spontaneously. Gait is normal. CNII-XII grossly in tact. Psych:  Responds to questions appropriately with a normal affect.     ASSESSMENT AND PLAN:  72 y.o. year old female with   1. Abscess This has already drained. Current site non-fluctuant.  To take antibiotic as directed.  Remainder of infection should resolve with this.  If any residual remains upon completion of antibiotic then return for follow-up. She is to take first dose of antibiotic immediately and take as directed to complete all 10 days.  Follow-up if needed. -  sulfamethoxazole-trimethoprim (BACTRIM DS,SEPTRA DS) 800-160 MG tablet; Take 1 tablet by mouth 2 (two) times daily for 10 days.  Dispense: 20 tablet; Refill: 0   Signed, [address] [place], [place], [facility] 05/10/2018 12:46 PM

## 2018-05-17 ENCOUNTER — Encounter: Payer: Self-pay | Admitting: Family Medicine

## 2018-05-17 ENCOUNTER — Ambulatory Visit (INDEPENDENT_AMBULATORY_CARE_PROVIDER_SITE_OTHER): Payer: Medicare Other | Admitting: Family Medicine

## 2018-05-17 VITALS — BP 130/68 | HR 65 | Temp 98.1°F | Resp 14 | Ht 64.0 in | Wt 188.0 lb

## 2018-05-17 DIAGNOSIS — E1122 Type 2 diabetes mellitus with diabetic chronic kidney disease: Secondary | ICD-10-CM | POA: Diagnosis not present

## 2018-05-17 DIAGNOSIS — I6300 Cerebral infarction due to thrombosis of unspecified precerebral artery: Secondary | ICD-10-CM | POA: Diagnosis not present

## 2018-05-17 DIAGNOSIS — R5382 Chronic fatigue, unspecified: Secondary | ICD-10-CM

## 2018-05-17 DIAGNOSIS — Z23 Encounter for immunization: Secondary | ICD-10-CM

## 2018-05-17 DIAGNOSIS — E1165 Type 2 diabetes mellitus with hyperglycemia: Secondary | ICD-10-CM

## 2018-05-17 DIAGNOSIS — E7800 Pure hypercholesterolemia, unspecified: Secondary | ICD-10-CM | POA: Diagnosis not present

## 2018-05-17 DIAGNOSIS — I2510 Atherosclerotic heart disease of native coronary artery without angina pectoris: Secondary | ICD-10-CM

## 2018-05-17 DIAGNOSIS — IMO0002 Reserved for concepts with insufficient information to code with codable children: Secondary | ICD-10-CM

## 2018-05-17 DIAGNOSIS — E1065 Type 1 diabetes mellitus with hyperglycemia: Secondary | ICD-10-CM | POA: Diagnosis not present

## 2018-05-17 MED ORDER — PITAVASTATIN CALCIUM 4 MG PO TABS: 4.0000 mg | ORAL_TABLET | Freq: Every day | ORAL | 3 refills | Status: DC

## 2018-05-17 NOTE — Addendum Note (Signed)
Addended by: Shary Decamp B on: 05/17/2018 03:19 PM   Modules accepted: Orders

## 2018-05-17 NOTE — Progress Notes (Signed)
Subjective:    Patient ID: Brenda Moreno, female    DOB: 1946-05-16, 72 y.o.   MRN: [id number]  HPI  Unfortunately, since the last time I saw the patient, she was admitted to the hospital with a CVA.  Symptoms included ataxia and diplopia.  I have copied relevant portions of the discharge summary included them below for my reference: Admit date: 01/16/2018 Discharge date: 01/19/2018  Time spent: 40 minutes  Recommendations for Outpatient Follow-up:  1. Follow up CBC/CMP outpatient (attention to creatinine, fluctuating recently and restarting her BP meds) 2. Ensure follow up with neurology as outpatient 3. Follow blood pressure.  Pt to resume BP meds as outpatient, but to hold metoprolol due to bradycardia seen during hospitalization (consider restarting at lower dose as outpatient) 4. Aspirin/plavix x 21 days, then plavix only 5. Continue to optimize treatment of diabetes, HTN, HLD.   6. Continue to pursue prior auth of livalo as outpatient.  Consider PSCK-9 inhibitor.   Discharge Diagnoses:  Principal Problem:   CVA (cerebral vascular accident) ([place]) Active Problems:   Diabetes mellitus ([place])   HYPERCHOLESTEROLEMIA   Benign essential HTN   Peripheral vascular disease, unspecified ([place])   Chronic diastolic heart failure ([place])   Myocardial infarction [facility])  Discharge Condition: stable  Diet recommendation: heart healty       Filed Weights   01/16/18 1305 01/17/18 2056 01/19/18 1318  Weight: 83.9 kg (185 lb) 89.9 kg (198 lb 3.1 oz) 89.8 kg (198 lb)    History of present illness:  Per previous PN Aneshia Y Haasis an obese 72 y.o.femalewith medical history significantfor DM2, HTN, Diastolic CHF, CABG, PVD,who presented to the ED with complaints of unstable gait that started at about 5 AM this morning when she got up to use the bathroom.Patient reported she felt like she was moving sideways. Shedenies lightheadedness or sensation of room spinning.She has also  endorses double vision that started at about the same time,that improves when she closes one eye.   MRI brain done without contrast showed a subcentimeter focusofacute/early subacute infarction extending from right paramedian midbrain to the right superior cerebellar peduncle.Neurology was consulted- Dr Aroor,recommended admission to [facility] hospitalist service forCVA workup.  Stroke work up notable for echo with possible LA mass.  TEE was obtained which did not identify a cardiac source of emboli (mass on previous image was thought to be artifact).  Tele sinus brady.  CTA head and neck with calcified plaque bilateral verterbral arteries with mild less than 50% stenosis, calcified plaque of L subclavian artery origin with approximately 50% mild to moderate stenosis.  No high grade stenosis, aneurysm, or large vessel occlusion or vascular malformation.  Multiple segments of mild stenosis in anterior and posterior circulations as well as mild to moderate distal L cavernous ICA stenosis.  MRI with subcentimeter focus of acute/early subacute infaction from R paramedian midbrain to R superior cerebellar peduncle and small chronic infarctions within L cerebellar hemisphere, upper pons, R genu of internal capsule, and thalamus (see reports).  Seen by neurology who recommended ASA and plavix x3 weeks, then plavix alone.  Recommended pravachol and follow up to try to get livalo given myalgias. See by speech who did not recommend further SLP services.  PT and OT recommended home health.    Hospital Course:  Acute/Subacute CVA with Symptoms of Unsteady gait. -Not given tPA because outside of window  -CTA HeadandNeckdone wo stenosis -MRI brain subcentimeter focus of acute/early subacute infarction extending from right paramedian midbrain  to rightsuperior cerebellar peduncle,chronic small infarctions. Small chronic infarctions are present within the left cerebellar nhemisphere, upper pons, right genu  of internal capsule, thalamus.  -Patient was complaint with aspirin 81mg  daily. -Patient has not tolerated statins Lipitor or Crestor in the past-reported lower extremities"giving way".Was restarted on pravastatin-- resume until other medications can be given (follow up with PCP for livalo or consider PSCK9 inhibitor)  -ASA/plavix x 3 weeks then plavix alone -Will restart antihypertensives -Transthoracic Echocardiogram: appears to have an LA Mass-- TEE was negative for this -LDL 95 -Hgba1c done recently on 12/22/2017- 8.9 -PT/OT-- home health - Follow up with neuro in 4 weeks  HTN - will resume home BP meds at discharge, BP fluctuating here, but notably hypertensive in early afternoon  Bradycardia:  - continue to hold metoprolol given bradycardia - consider restarting outpatient at lower dose   CAD status post CABG -She had a recent cardiac catheterization on 12/22/2017 which showed severe triple-vessel CAD status post 6 vessel CABG with 5 out of 6 pain grafts. Per cardiology there is no focal targets for PCI  Chronic Diastolic CHF -Appears euvolemic, no chest pain, recent ECHO - 12/23/17-Ef- 60-65%, no RWMA, high ventricul;ar filling pressure- Mod pulm HTN. -Continue home Lasix 20 mg daily -Resume BP meds, but hold metoprolol with bradycardia  Uncontrolled Insulin Dependent Diabetes Mellitus Type 2 -Last Hgba1c- 9.9.  -Currently receiving 8 units of NovoLog 70/30 mix twice daily with meals. She takes Novolin 70/30 15 units with breakfast and 7 units with supper at home. -hold metformin as she had a CTA -SSI  CKD Stage III -recheck in AM  Procedures: TEE Study Conclusions  - Left ventricle: Systolic function was vigorous. The estimated ejection fraction was in the range of 65% to 70%. - Mitral valve: There was mild to moderate regurgitation. - Left atrium: No LA mass noted Images on TTE PSL axis would appear to be artifact. The atrium was moderately  dilated. No evidence of thrombus in the atrial cavity or appendage. - Right atrium: No evidence of thrombus in the atrial cavity or appendage. - Atrial septum: No defect or patent foramen ovale was identified. Echo contrast study showed no right-to-left atrial level shunt, at baseline or with provocation. - Impressions: TEE done in endoscopy with moderate sedation 7 mg versed and 50 ug fentanyl Time 30 mintues  Impressions:  - TEE done in endoscopy with moderate sedation 7 mg versed and 50 ug fentanyl Time 30 mintues No cardiac source of emboli was indentified.  TTE Study Conclusions  - Left ventricle: The cavity size was normal. Systolic function was normal. The estimated ejection fraction was in the range of 60% to 65%. Wall motion was normal; there were no regional wall motion abnormalities. The study is not technically sufficient to allow evaluation of LV diastolic function. - Aortic valve: Severe focal calcification involving the noncoronary cusp. - Mitral valve: There was trivial regurgitation. - Left atrium: There is a 3.2 x 1.07 oblong density in the LA that could represent thrombus. Recommend TEE for further delineation. - Atrial septum: There was increased thickness of the septum, consistent with lipomatous hypertrophy. - Pulmonic valve: There was trivial regurgitation. - Pulmonary arteries: PA peak pressure: 39 mm Hg (S).  Impressions:  - Possible LA mass although only seen in the parasternal long axis view. Recommend TEE for further delineation. The right ventricular systolic pressure was increased consistent with mild pulmonary hypertension.  Consultations:  neurology  Patient is here today for follow-up.  She is  compliant with taking aspirin and Plavix together.  She realizes she needs to do this for the next 21 days and then resume Plavix alone.  She denies any bleeding or bruising.  She denies any further episodes  of chest pain.  Of note she was recently admitted with chest pain and was found to have coronary artery disease that is being managed medically.  She continues to refrain from smoking.  However her uncontrolled hyperlipidemia and her uncontrolled diabetes continue to be her 2 biggest risk factors for future heart attack and stroke.  She is currently on 15 units of 70/30 in the morning and 12 units of 70/30 in the evening of Novolin.  Her fasting blood sugars in the morning are well controlled between 80 and 120.  Her 2-hour postprandial sugars are in the mid 200s in the evening.  She is taking pravastatin.  She has a long history of statin intolerance.  She tolerated Livalo well but could not afford the medication.  She would be an excellent PSK 9 inhibitor candidate however again cost would likely be prohibitive for this patient.  We had a long discussion today about the fact that she is on a moderate intensity statin and that given her recent admissions, she would be better served on a high intensity statin such as Crestor 20 to 40 mg a day.  She is very hesitant to increase the medication due to her previous statin induced intolerance however she is also concerned based on her recent hospital admissions and wants to maximize medical therapy.  At that time, my plan was: Continue to refrain from smoking.  Continue dual antiplatelet therapy for a total of 21 days and then switch to Plavix 75 mg daily indefinitely.  Blood pressure is well controlled.  However glycemic control is not acceptable.  Increase 70/30 to 22 units in the morning to better control her evening sugars and continue 12 units in the evening.  Recheck fasting blood sugars and 2-hour postprandial sugars in 1 week.  Discontinue pravastatin 80 mg a day and switch to Crestor 20 mg a day.  Patient cannot tolerate high intensity statin, I would try to get her covered for a PSK 9 inhibitor.  However based on this patient's insurance, she cannot even  afford more effective long-acting insulin such as Lantus or Basaglar.  Therefore I am not certain how she would be able to afford a PSK 9 inhibitor.  05/17/18 Patient is here today for follow-up.  I am very proud of the patient because she continues to refrain from smoking.  She is currently on Plavix for secondary prevention of stroke and cardiovascular disease.  She denies any chest pain shortness of breath or dyspnea on exertion.  She denies any further strokelike symptoms.  Only she reports feeling sleepy and tired all the time.  She is under tremendous stress also caring for her brother has been diagnosed with lung cancer and I believe is the stress this likely contributing to her fatigue.  Patient is currently taking 22 units of 70/30 at night.  Her fasting blood sugars in the morning are 70-90.  However her evening 2-hour postprandial sugars are typically 200-2 20.  I question the patient as to why she is doing this and she states is because it is working good she also discontinued Crestor due to myalgias in her legs.  Past she was able to tolerate Livalo.  She is due for her flu shot  Past Medical History:  Diagnosis Date  .  Arthritis   . CAD (coronary artery disease)    S/P cabg  . Carotid artery occlusion   . Cataract   . CHF (congestive heart failure) ([place])   . DDD (degenerative disc disease), lumbar   . Diabetes mellitus   . Hyperlipidemia   . Hypertension   . Joint pain   . Leg pain   . Myocardial infarction ([place]) 1998  . Peripheral vascular disease ([place])   . PVD (peripheral vascular disease) ([place])   . Stroke [facility])    Past Surgical History:  Procedure Laterality Date  . APPENDECTOMY    . BREAST EXCISIONAL BIOPSY Left 2008  . CHOLECYSTECTOMY     Gall bladder  . COLONOSCOPY  2008  . CORONARY ARTERY BYPASS GRAFT     1998  . LEFT HEART CATH AND CORS/GRAFTS ANGIOGRAPHY N/A 12/22/2017   Procedure: LEFT HEART CATH AND CORS/GRAFTS ANGIOGRAPHY;  Surgeon: Burnell Blanks,  MD;  Location: [place] [facility];  Service: Cardiovascular;  Laterality: N/A;  . PR VEIN BYPASS GRAFT,AORTO-FEM-POP    . TEE WITHOUT CARDIOVERSION N/A 01/19/2018   Procedure: TRANSESOPHAGEAL ECHOCARDIOGRAM (TEE);  Surgeon: Josue Hector, MD;  Location: [facility] ENDOSCOPY;  Service: Cardiovascular;  Laterality: N/A;  . TUBAL LIGATION     Current Outpatient Medications on File Prior to Visit  Medication Sig Dispense Refill  . amLODipine (NORVASC) 5 MG tablet TAKE 1 TABLET BY MOUTH  DAILY 90 tablet 1  . CINNAMON PO Take 1,000 mg by mouth 2 (two) times daily.      . clopidogrel (PLAVIX) 75 MG tablet TAKE 1 TABLET BY MOUTH EVERY DAY 90 tablet 3  . Coenzyme Q10 (CO Q 10 PO) Take 100 mg by mouth daily.      . fluticasone (FLONASE) 50 MCG/ACT nasal spray Place 2 sprays into both nostrils daily as needed for allergies.    . furosemide (LASIX) 40 MG tablet Take 40 mg by mouth daily. Patient taking 0.5 tablets po qd.     . Insulin Isophane & Regular Human (HUMULIN 70/30 KWIKPEN) (70-30) 100 UNIT/ML PEN 15 units with breakfast and 7 units with supper (Patient taking differently: 20 units qd) 15 mL 11  . metFORMIN (GLUCOPHAGE) 1000 MG tablet TAKE 1 TABLET BY MOUTH  TWICE A DAY WITH MEALS 180 tablet 1  . nitroGLYCERIN (NITROSTAT) 0.4 MG SL tablet Place 1 tablet (0.4 mg total) under the tongue every 5 (five) minutes as needed for chest pain. 90 tablet 2  . pioglitazone (ACTOS) 30 MG tablet TAKE 1 TABLET BY MOUTH  DAILY 90 tablet 1  . quinapril (ACCUPRIL) 40 MG tablet TAKE 1 TABLET BY MOUTH TWO  TIMES DAILY 180 tablet 3  . traMADol (ULTRAM) 50 MG tablet Take 1 tablet (50 mg total) by mouth every 8 (eight) hours as needed. 180 tablet 0  . potassium chloride (K-DUR) 10 MEQ tablet Take 1 tablet (10 mEq total) by mouth daily. 30 tablet 11  . rosuvastatin (CRESTOR) 20 MG tablet Take 1 tablet (20 mg total) by mouth daily. (Patient not taking: Reported on 05/17/2018) 90 tablet 3   No current facility-administered  medications on file prior to visit.    Allergies  Allergen Reactions  . Levaquin [Levofloxacin In D5w] Other (See Comments)    Pain all over and in joints  . Statins Other (See Comments)    Severe myalgias to lipitor, crestor, pravastatin and weakness and cramping  in bilateral leg.  [name] [Linagliptin] Itching   Social History  Socioeconomic History  . Marital status: Divorced    Spouse name: Not on file  . Number of children: Not on file  . Years of education: Not on file  . Highest education level: Not on file  Occupational History  . Not on file  Social Needs  . Financial resource strain: Not on file  . Food insecurity:    Worry: Not on file    Inability: Not on file  . Transportation needs:    Medical: Not on file    Non-medical: Not on file  Tobacco Use  . Smoking status: Former Smoker    Packs/day: 0.25    Types: Cigarettes    Last attempt to quit: 09/13/2011    Years since quitting: 6.6  . Smokeless tobacco: Never Used  Substance and Sexual Activity  . Alcohol use: No  . Drug use: No  . Sexual activity: Not on file  Lifestyle  . Physical activity:    Days per week: Not on file    Minutes per session: Not on file  . Stress: Not on file  Relationships  . Social connections:    Talks on phone: Not on file    Gets together: Not on file    Attends religious service: Not on file    Active member of club or organization: Not on file    Attends meetings of clubs or organizations: Not on file    Relationship status: Not on file  . Intimate partner violence:    Fear of current or ex partner: Not on file    Emotionally abused: Not on file    Physically abused: Not on file    Forced sexual activity: Not on file  Other Topics Concern  . Not on file  Social History Narrative  . Not on file    Review of Systems  All other systems reviewed and are negative.      Objective:   Physical Exam  Constitutional: She appears well-developed.    Cardiovascular: Normal rate, regular rhythm, normal heart sounds and intact distal pulses. Exam reveals no gallop and no friction rub.  No murmur heard. Pulmonary/Chest: Effort normal and breath sounds normal. No stridor. No respiratory distress. She has no wheezes. She has no rales.  Abdominal: Soft. Bowel sounds are normal.  Musculoskeletal: She exhibits no edema.  Vitals reviewed.         Assessment & Plan:  Uncontrolled type 2 diabetes mellitus with chronic kidney disease ([place]) - Plan: Hemoglobin A1c, CBC with Differential/Platelet, COMPLETE METABOLIC PANEL WITH GFR  Chronic fatigue - Plan: TSH  Cerebrovascular accident (CVA) due to thrombosis of precerebral artery ([place])  Coronary artery disease involving native coronary artery of native heart without angina pectoris  Pure hypercholesterolemia  Spent 20 minutes today with the patient explaining the mechanism of action of 70/30.  I explained to the patient that it does not last 24 hours and that she needs to split the dose up to achieve continuous glycemic control.  I have recommended 15 units in the morning with breakfast and 7 units in the evening with supper and then adjust the dose based on her blood sugars at that point.  Taking 22 units at night before bed increases her odds of hypoglycemia at nighttime and also likely explains her elevated postprandial sugars with lunch and supper.  I have also recommended resuming Livalo 4 mg daily in place of Crestor given her myalgias.  Patient received her flu shot.  I will check a CBC,  CMP, and hemoglobin A1c.  Goal hemoglobin A1c is less than 7.  Given her fatigue I will also check a TSH.

## 2018-05-18 LAB — COMPLETE METABOLIC PANEL WITH GFR
AG RATIO: 1.5 (calc) (ref 1.0–2.5)
ALT: 87 U/L — AB (ref 6–29)
AST: 85 U/L — AB (ref 10–35)
Albumin: 4 g/dL (ref 3.6–5.1)
Alkaline phosphatase (APISO): 104 U/L (ref 33–130)
BUN/Creatinine Ratio: 14 (calc) (ref 6–22)
BUN: 26 mg/dL — ABNORMAL HIGH (ref 7–25)
CO2: 23 mmol/L (ref 20–32)
Calcium: 9.6 mg/dL (ref 8.6–10.4)
Chloride: 108 mmol/L (ref 98–110)
Creat: 1.82 mg/dL — ABNORMAL HIGH (ref 0.60–0.93)
GFR, EST NON AFRICAN AMERICAN: 28 mL/min/{1.73_m2} — AB (ref >=60)
GFR, Est African American: 32 mL/min/{1.73_m2} — ABNORMAL LOW (ref >=60)
Globulin: 2.6 g/dL (calc) (ref 1.9–3.7)
Glucose, Bld: 89 mg/dL (ref 65–99)
POTASSIUM: 4.8 mmol/L (ref 3.5–5.3)
Sodium: 141 mmol/L (ref 135–146)
Total Bilirubin: 0.4 mg/dL (ref 0.2–1.2)
Total Protein: 6.6 g/dL (ref 6.1–8.1)

## 2018-05-18 LAB — CBC WITH DIFFERENTIAL/PLATELET
Basophils Absolute: 51 cells/uL (ref 0–200)
Basophils Relative: 0.9 %
EOS PCT: 2.1 %
Eosinophils Absolute: 120 cells/uL (ref 15–500)
HCT: 35.6 % (ref 35.0–45.0)
Hemoglobin: 11.8 g/dL (ref 11.7–15.5)
Lymphs Abs: 1687 cells/uL (ref 850–3900)
MCH: 26.8 pg — ABNORMAL LOW (ref 27.0–33.0)
MCHC: 33.1 g/dL (ref 32.0–36.0)
MCV: 80.7 fL (ref 80.0–100.0)
MONOS PCT: 11.8 %
MPV: 12 fL (ref 7.5–12.5)
NEUTROS ABS: 3169 {cells}/uL (ref 1500–7800)
NEUTROS PCT: 55.6 %
PLATELETS: 215 10*3/uL (ref 140–400)
RBC: 4.41 10*6/uL (ref 3.80–5.10)
RDW: 15.6 % — AB (ref 11.0–15.0)
TOTAL LYMPHOCYTE: 29.6 %
WBC mixed population: 673 cells/uL (ref 200–950)
WBC: 5.7 10*3/uL (ref 3.8–10.8)

## 2018-05-18 LAB — HEMOGLOBIN A1C
Hgb A1c MFr Bld: 7.8 % of total Hgb — ABNORMAL HIGH (ref <5.7)
Mean Plasma Glucose: 177 (calc)
eAG (mmol/L): 9.8 (calc)

## 2018-05-18 LAB — TSH: TSH: 1.36 mIU/L (ref 0.40–4.50)

## 2018-06-01 ENCOUNTER — Ambulatory Visit (INDEPENDENT_AMBULATORY_CARE_PROVIDER_SITE_OTHER): Payer: Medicare Other | Admitting: Adult Health

## 2018-06-01 ENCOUNTER — Encounter: Payer: Self-pay | Admitting: Adult Health

## 2018-06-01 VITALS — BP 163/63 | HR 55 | Ht 64.0 in | Wt 187.0 lb

## 2018-06-01 DIAGNOSIS — I10 Essential (primary) hypertension: Secondary | ICD-10-CM | POA: Diagnosis not present

## 2018-06-01 DIAGNOSIS — E785 Hyperlipidemia, unspecified: Secondary | ICD-10-CM

## 2018-06-01 DIAGNOSIS — E119 Type 2 diabetes mellitus without complications: Secondary | ICD-10-CM | POA: Diagnosis not present

## 2018-06-01 DIAGNOSIS — I6300 Cerebral infarction due to thrombosis of unspecified precerebral artery: Secondary | ICD-10-CM | POA: Diagnosis not present

## 2018-06-01 DIAGNOSIS — Z794 Long term (current) use of insulin: Secondary | ICD-10-CM

## 2018-06-01 NOTE — Progress Notes (Signed)
[facility] [address] [place]. [place] [address] [phone]       OFFICE FOLLOW UP NOTE  [name] Date of Birth:  1946-05-03 Medical Record Number:  [id number]   Reason for visit: Stroke follow up  CHIEF COMPLAINT:  Chief Complaint  Patient presents with  . Follow-up    Patient reports having problems with her left side. She sometimes can't lift her left arm over her head.   . Other    Patient was switched from Crestor to Livalo due to side effects.     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 History obtained from patient and chart review. Reviewed all radiology images and labs personally.  [name] is a 72 y.o. female with history of HTN, HLD w/ statin intolerance and uncontrolled DB presenting with gait instability. Pt though r/t to statin as she has had myalgias on statins in past and was started on pravachol 80 mg 2 weeks ago.  CT head showed no acute abnormality.  CTA head and neck showed mild stenosis anterior and posterior circulation distal left ICA.  CTA of neck showed no sign of stenosis.  MRI head showed right paramedial midbrain subcentimeter infarct along with old left cerebellar, upper pontine, right WIC, thalamic infarcts along with small vessel disease and atrophy.  2D echo showed EF of 60 to 65% with severe calcification AV concerning for thrombus.  TEE performed was negative for LA mass or thrombus.  LDL 95 and recommended to continue [name] 80 mg and PCP to continue to pursue insurance approval of the Lival possible consider [place].  A1c elevated at 8.9 and recommended tight glycemic control with close PCP follow-up.  As patient was on aspirin 81 mg prior it was recommended aspirin 81 and Plavix for 3 weeks and then Plavix alone.  Therapy recommended home health PT/OT.  02/21/2018 visit: Patient is being seen today for hospital follow-up and is accompanied by her  husband.  She continues to participate in physical therapy but possibly ending today or tomorrow and states all her previous symptoms have resolved.  She continues to take Plavix only with mild bruising but no bleeding.  PCP recently started patient on Crestor which she is tolerating well at this time but PCP is continuing to work on statin alternatives due to sensitivity.  Blood pressure today mildly elevated at 142/63 but patient does monitor this at home and states is typically lower.  She has been also having a difficult time managing glucose levels as they have been low in the morning and elevated in the evening but during this past week blood sugars in the evening have been less than 304 previously there was greater than 300.  She is following with her PCP in regards to diabetic management.  She has returned to all previous activities without complications.  Denies new or worsening stroke/TIA symptoms.  Interval history 06/01/2018: Patient is being seen today for scheduled follow-up visit.  She states overall she is been doing well from a stroke standpoint without residual deficits or symptoms.  She does have complaints of left shoulder pain that worsens with movement and has been using her friend's diclofenac cream and requesting a prescription on her own as this has been helping.  She continues to take Plavix without side effects of bleeding or bruising.  Her primary doctor attempted to start her on Crestor as she was unable to  tolerate it was restarted on pitavastatin which she has been tolerating without side effects of myalgias.  Blood pressure today elevated at 163/63 as she states she was rushing because she was running late and just took her antihypertensives in a parking lot.  She does monitor at home and typical SBP 1 20-1 30.  She does have complaints of dizziness related to position changes intermittently.  Denies any other concerns or new or worsening stroke/TIA symptoms.   ROS:   14 system  review of systems performed and negative with exception of dizziness   PMH:  Past Medical History:  Diagnosis Date  . Arthritis   . CAD (coronary artery disease)    S/P cabg  . Carotid artery occlusion   . Cataract   . CHF (congestive heart failure) ([place])   . DDD (degenerative disc disease), lumbar   . Diabetes mellitus   . Hyperlipidemia   . Hypertension   . Joint pain   . Leg pain   . Myocardial infarction ([place]) 1998  . Peripheral vascular disease ([place])   . PVD (peripheral vascular disease) ([place])   . Stroke [facility])     PSH:  Past Surgical History:  Procedure Laterality Date  . APPENDECTOMY    . BREAST EXCISIONAL BIOPSY Left 2008  . CHOLECYSTECTOMY     Gall bladder  . COLONOSCOPY  2008  . CORONARY ARTERY BYPASS GRAFT     1998  . LEFT HEART CATH AND CORS/GRAFTS ANGIOGRAPHY N/A 12/22/2017   Procedure: LEFT HEART CATH AND CORS/GRAFTS ANGIOGRAPHY;  Surgeon: Burnell Blanks, MD;  Location: [facility];  Service: Cardiovascular;  Laterality: N/A;  . PR VEIN BYPASS GRAFT,AORTO-FEM-POP    . TEE WITHOUT CARDIOVERSION N/A 01/19/2018   Procedure: TRANSESOPHAGEAL ECHOCARDIOGRAM (TEE);  Surgeon: Josue Hector, MD;  Location: [facility];  Service: Cardiovascular;  Laterality: N/A;  . TUBAL LIGATION      Social History:  Social History   Socioeconomic History  . Marital status: Divorced    Spouse name: Not on file  . Number of children: Not on file  . Years of education: Not on file  . Highest education level: Not on file  Occupational History  . Not on file  Social Needs  . Financial resource strain: Not on file  . Food insecurity:    Worry: Not on file    Inability: Not on file  . Transportation needs:    Medical: Not on file    Non-medical: Not on file  Tobacco Use  . Smoking status: Former Smoker    Packs/day: 0.25    Types: Cigarettes    Last attempt to quit: 09/13/2011    Years since quitting: 6.7  . Smokeless tobacco: Never Used  Substance  and Sexual Activity  . Alcohol use: No  . Drug use: No  . Sexual activity: Not on file  Lifestyle  . Physical activity:    Days per week: Not on file    Minutes per session: Not on file  . Stress: Not on file  Relationships  . Social connections:    Talks on phone: Not on file    Gets together: Not on file    Attends religious service: Not on file    Active member of club or organization: Not on file    Attends meetings of clubs or organizations: Not on file    Relationship status: Not on file  . Intimate partner violence:    Fear of current or ex partner:  Not on file    Emotionally abused: Not on file    Physically abused: Not on file    Forced sexual activity: Not on file  Other Topics Concern  . Not on file  Social History Narrative  . Not on file    Family History:  Family History  Problem Relation Age of Onset  . Heart disease Mother   . Diabetes Mother   . Hyperlipidemia Mother   . Hypertension Mother   . Stroke Mother   . Heart disease Father        Heart Disease before age 6  . Diabetes Father   . Hyperlipidemia Father   . Hypertension Father   . Heart disease Sister        heart attack  . Diabetes Sister        Amputation  . Hypertension Sister   . Other Sister        history of amputation  . Heart attack Sister   . Diabetes Brother   . Hypertension Brother   . Heart disease Brother 37       Before age 55  . Hyperlipidemia Brother   . Diabetes Brother        scirrosis of liver  . Coronary artery disease Other   . Colon cancer Neg Hx   . Stomach cancer Neg Hx   . Esophageal cancer Neg Hx     Medications:   Current Outpatient Medications on File Prior to Visit  Medication Sig Dispense Refill  . amLODipine (NORVASC) 5 MG tablet TAKE 1 TABLET BY MOUTH  DAILY 90 tablet 1  . CINNAMON PO Take 1,000 mg by mouth 2 (two) times daily.      . clopidogrel (PLAVIX) 75 MG tablet TAKE 1 TABLET BY MOUTH EVERY DAY 90 tablet 3  . Coenzyme Q10 (CO Q 10 PO)  Take 100 mg by mouth daily.      . fluticasone (FLONASE) 50 MCG/ACT nasal spray Place 2 sprays into both nostrils daily as needed for allergies.    . furosemide (LASIX) 40 MG tablet Take 40 mg by mouth daily. Patient taking 0.5 tablets po qd.     . Insulin Isophane & Regular Human (HUMULIN 70/30 KWIKPEN) (70-30) 100 UNIT/ML PEN 15 units with breakfast and 7 units with supper (Patient taking differently: 20 units qd) 15 mL 11  . metFORMIN (GLUCOPHAGE) 1000 MG tablet TAKE 1 TABLET BY MOUTH  TWICE A DAY WITH MEALS 180 tablet 1  . nitroGLYCERIN (NITROSTAT) 0.4 MG SL tablet Place 1 tablet (0.4 mg total) under the tongue every 5 (five) minutes as needed for chest pain. 90 tablet 2  . pioglitazone (ACTOS) 30 MG tablet TAKE 1 TABLET BY MOUTH  DAILY 90 tablet 1  . Pitavastatin Calcium (LIVALO) 4 MG TABS Take 1 tablet (4 mg total) by mouth daily. 30 tablet 3  . quinapril (ACCUPRIL) 40 MG tablet TAKE 1 TABLET BY MOUTH TWO  TIMES DAILY 180 tablet 3  . traMADol (ULTRAM) 50 MG tablet Take 1 tablet (50 mg total) by mouth every 8 (eight) hours as needed. 180 tablet 0  . potassium chloride (K-DUR) 10 MEQ tablet Take 1 tablet (10 mEq total) by mouth daily. 30 tablet 11   No current facility-administered medications on file prior to visit.     Allergies:   Allergies  Allergen Reactions  . Levaquin [Levofloxacin In D5w] Other (See Comments)    Pain all over and in joints  . Statins Other (See  Comments)    Severe myalgias to lipitor, crestor, pravastatin and weakness and cramping  in bilateral leg.  [name] [Linagliptin] Itching     Physical Exam  Vitals:   06/01/18 1103  BP: (!) 163/63  Pulse: (!) 55  Weight: 187 lb (84.8 kg)  Height: 5\' 4"  (1.626 m)   Body mass index is 32.1 kg/m. No exam data present  General: well developed, well nourished, pleasant elderly female, seated, in no evident distress Head: head normocephalic and atraumatic.   Neck: supple with no carotid or supraclavicular  bruits Cardiovascular: regular rate and rhythm, no murmurs Musculoskeletal: no deformity Skin:  no rash/petichiae Vascular:  Normal pulses all extremities  Neurologic Exam Mental Status: Awake and fully alert. Oriented to place and time. Recent and remote memory intact. Attention span, concentration and fund of knowledge appropriate. Mood and affect appropriate.  Cranial Nerves: Fundoscopic exam reveals sharp disc margins. Pupils equal, briskly reactive to light. Extraocular movements full without nystagmus. Visual fields full to confrontation. Hearing intact. Facial sensation intact. Face, tongue, palate moves normally and symmetrically.  Motor: Normal bulk and tone. Normal strength in all tested extremity muscles. Sensory.: intact to touch , pinprick , position and vibratory sensation.  Coordination: Rapid alternating movements normal in all extremities. Finger-to-nose and heel-to-shin performed accurately bilaterally. Gait and Station: Arises from chair without difficulty. Stance is normal. Gait demonstrates normal stride length and balance . Able to heel, toe and tandem walk without difficulty.  Reflexes: 1+ and symmetric. Toes downgoing.      Diagnostic Data (Labs, Imaging, Testing)  CT head wo contrast CT angio head/neck w or wo contrast 01/16/2018 IMPRESSION: CT head: 1. No acute intracranial abnormality identified. 2. Stable chronic microvascular ischemic changes and parenchymal volume loss of the brain. CTA neck: 1. Patent carotid and vertebral arteries. No significant stenosis by NASCET criteria, dissection, or occlusion. 2. Calcified plaque bilateral vertebral artery origins with mild less than 50% stenosis. 3. Calcified plaque of left subclavian artery origin, partially obscured by streak artifact, approximately 50% mild-to-moderate stenosis. CTA head: 1. Patent anterior and posterior intracranial circulation. No high-grade stenosis, aneurysm, large vessel occlusion,  or vascular malformation. 2. Multiple segments of mild stenosis in the anterior and posterior circulations as well as mild-to-moderate distal left cavernous ICA Stenosis.  MR brain wo contrast 01/16/2018 IMPRESSION: 1. Subcentimeter focus of acute/early subacute infarction extending from the right paramedian midbrain the right superior cerebellar peduncle. No associated hemorrhage or mass effect. 2. Small chronic infarctions are present within the left cerebellar hemisphere, upper pons, right genu of internal capsule, thalamus. 3. Moderate for age chronic microvascular ischemic changes and parenchymal volume loss of the brain.   2D Echocardiogram  - Left ventricle: The cavity size was normal. Systolic function wasnormal. The estimated ejection fraction was in the range of 60%to 65%. Wall motion was normal; there were no regional wallmotion abnormalities. The study is not technically sufficient toallow evaluation of LV diastolic function. - Aortic valve: Severe focal calcification involving thenoncoronary cusp. - Mitral valve: There was trivial regurgitation. - Left atrium: There is a 3.2 x 1.07 oblong density in the LA thatcould represent thrombus. Recommend TEE for further delineation. - Atrial septum: There was increased thickness of the septum,consistent with lipomatous hypertrophy. - Pulmonic valve: There was trivial regurgitation. - Pulmonary arteries: PA peak pressure: 39 mm Hg (S). Impressions:   Possible LA mass although only seen in the parasternal long axisview. Recommend TEE for further delineation. The rightventricular systolic pressure was increased consistent  with mildpulmonary hypertension.  TEE EF 60% No LAA thrombus and no LA mass Mild to moderate MR AV sclerosis Normal aortic root with somewhat bulky calcific atherosclerosis in the arch Normal RV Negative bubble study No PFO No effusion      ASSESSMENT: Brenda Moreno is a 72 y.o. 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 Vascular risk factors include HTN, HLD and DM.  Patient is being seen today for stroke follow-up and overall has been stable from a stroke standpoint.  Does have concerns of dizziness related to position changing along with left shoulder pain that she believes is related to arthritis.    PLAN: -Continue clopidogrel 75 mg daily  and pitavastatin for secondary stroke prevention -F/u with PCP regarding your HLD, HLD and DM management -Advised to follow-up with PCP for shoulder pain as this could be related to arthritis but should be worked up for any other type of modalities such as tendinitis or rotator cuff issue -Dizziness related to position changes most likely is due to orthostatic hypotension and patient was educated on ways to lessen symptoms related to this such as leg movement prior to standing after prolonged sitting, increasing fluids and standing in one spot after position change until sensation resolves.  Advised to follow with PCP if continues or worsens -continue to monitor BP at home -Advised importance of eating healthy and stay active -Maintain strict control of hypertension with blood pressure goal below 130/90, diabetes with hemoglobin A1c goal below 6.5% and cholesterol with LDL cholesterol (bad cholesterol) goal below 70 mg/dL. I also advised the patient to eat a healthy diet with plenty of whole grains, cereals, fruits and vegetables, exercise regularly and maintain ideal body weight.  Follow up in 6 months or call earlier if needed   Greater than 50% of time during this 25 minute visit was spent on counseling,explanation of diagnosis of right paramedian pontine infarct, reviewing risk factor management of HLD, HTN and DM, planning of further management, discussion with patient and family and coordination of care    Venancio Poisson, [facility]  [facility] [address] [place], [place] [address]  Phone [phone] Fax [phone]

## 2018-06-01 NOTE — Progress Notes (Signed)
I agree with the above plan 

## 2018-06-01 NOTE — Patient Instructions (Signed)
Continue clopidogrel 75 mg daily and pitavastatin for secondary stroke prevention  Continue to follow up with PCP regarding cholesterol, diabetes and blood pressure management   Follow up with PCP regarding shoulder pain to ensure there is nothing else going on with your shoulder besides arthritis   Continue to stay active and maintain a healthy diet  Continue to monitor blood pressure at home  Maintain strict control of hypertension with blood pressure goal below 130/90, diabetes with hemoglobin A1c goal below 6.5% and cholesterol with LDL cholesterol (bad cholesterol) goal below 70 mg/dL. I also advised the patient to eat a healthy diet with plenty of whole grains, cereals, fruits and vegetables, exercise regularly and maintain ideal body weight.  Followup in the future with me in 6 months or call earlier if needed       Thank you for coming to see [place] at [facility]. I hope we have been able to provide you high quality care today.  You may receive a patient satisfaction survey over the next few weeks. We would appreciate your feedback and comments so that we may continue to improve ourselves and the health of our patients.

## 2018-06-21 ENCOUNTER — Other Ambulatory Visit: Payer: Self-pay | Admitting: Family Medicine

## 2018-06-21 DIAGNOSIS — N289 Disorder of kidney and ureter, unspecified: Secondary | ICD-10-CM

## 2018-06-21 DIAGNOSIS — R7989 Other specified abnormal findings of blood chemistry: Secondary | ICD-10-CM

## 2018-06-21 DIAGNOSIS — R945 Abnormal results of liver function studies: Principal | ICD-10-CM

## 2018-06-25 ENCOUNTER — Ambulatory Visit
Admission: RE | Admit: 2018-06-25 | Discharge: 2018-06-25 | Disposition: A | Discharge status: Home / Self Care | Payer: Medicare Other | Source: Ambulatory Visit | Attending: Family Medicine | Admitting: Family Medicine

## 2018-06-25 DIAGNOSIS — R7989 Other specified abnormal findings of blood chemistry: Secondary | ICD-10-CM

## 2018-06-25 DIAGNOSIS — N289 Disorder of kidney and ureter, unspecified: Secondary | ICD-10-CM

## 2018-06-25 DIAGNOSIS — R945 Abnormal results of liver function studies: Principal | ICD-10-CM

## 2018-06-29 ENCOUNTER — Other Ambulatory Visit: Payer: Medicare Other

## 2018-06-29 DIAGNOSIS — R7989 Other specified abnormal findings of blood chemistry: Secondary | ICD-10-CM

## 2018-06-29 DIAGNOSIS — R945 Abnormal results of liver function studies: Principal | ICD-10-CM

## 2018-06-29 DIAGNOSIS — N289 Disorder of kidney and ureter, unspecified: Secondary | ICD-10-CM

## 2018-06-29 LAB — URINALYSIS, ROUTINE W REFLEX MICROSCOPIC
Bacteria, UA: NONE SEEN /HPF
Bilirubin Urine: NEGATIVE
GLUCOSE, UA: NEGATIVE
KETONES UR: NEGATIVE
Leukocytes, UA: NEGATIVE
NITRITE: NEGATIVE
Specific Gravity, Urine: 1.01 (ref 1.001–1.03)
WBC, UA: NONE SEEN /HPF (ref 0–5)
pH: 5 (ref 5.0–8.0)

## 2018-06-29 LAB — MICROSCOPIC MESSAGE

## 2018-06-30 LAB — COMPREHENSIVE METABOLIC PANEL
AG Ratio: 1.8 (calc) (ref 1.0–2.5)
ALBUMIN MSPROF: 4.2 g/dL (ref 3.6–5.1)
ALKALINE PHOSPHATASE (APISO): 113 U/L (ref 33–130)
ALT: 51 U/L — AB (ref 6–29)
AST: 62 U/L — ABNORMAL HIGH (ref 10–35)
BILIRUBIN TOTAL: 0.4 mg/dL (ref 0.2–1.2)
BUN/Creatinine Ratio: 19 (calc) (ref 6–22)
BUN: 23 mg/dL (ref 7–25)
CALCIUM: 10 mg/dL (ref 8.6–10.4)
CO2: 24 mmol/L (ref 20–32)
CREATININE: 1.23 mg/dL — AB (ref 0.60–0.93)
Chloride: 111 mmol/L — ABNORMAL HIGH (ref 98–110)
Globulin: 2.4 g/dL (calc) (ref 1.9–3.7)
Glucose, Bld: 37 mg/dL — CL (ref 65–99)
POTASSIUM: 4.5 mmol/L (ref 3.5–5.3)
SODIUM: 146 mmol/L (ref 135–146)
TOTAL PROTEIN: 6.6 g/dL (ref 6.1–8.1)

## 2018-08-08 ENCOUNTER — Ambulatory Visit (INDEPENDENT_AMBULATORY_CARE_PROVIDER_SITE_OTHER)
Admission: EM | Admit: 2018-08-08 | Discharge: 2018-08-08 | Disposition: A | Discharge status: Home / Self Care | Payer: Medicare Other | Source: Home / Self Care | Attending: Family Medicine | Admitting: Family Medicine

## 2018-08-08 ENCOUNTER — Encounter (HOSPITAL_COMMUNITY): Payer: Self-pay

## 2018-08-08 ENCOUNTER — Ambulatory Visit (INDEPENDENT_AMBULATORY_CARE_PROVIDER_SITE_OTHER): Payer: Medicare Other

## 2018-08-08 DIAGNOSIS — R0602 Shortness of breath: Secondary | ICD-10-CM

## 2018-08-08 DIAGNOSIS — R05 Cough: Secondary | ICD-10-CM | POA: Diagnosis not present

## 2018-08-08 DIAGNOSIS — R059 Cough, unspecified: Secondary | ICD-10-CM

## 2018-08-08 NOTE — ED Triage Notes (Signed)
Pt presents coughing with SOB and congestion that started on Monday.

## 2018-08-08 NOTE — Discharge Instructions (Signed)
Your chest xray was normal. However, as discussed, your symptoms may be more due to fluid retention given leg swelling. Increase your lasix daily to 40mg -60mg  for the next few days. Monitor your weight and fluid intake. Follow up with PCP or cardiologist this week or Monday for reevaluation needed. If experiencing worsening symptoms, chest pain, worsening shortness of breath, weakness, dizziness, passing out, go to the emergency department for further evaluation needed.

## 2018-08-08 NOTE — ED Provider Notes (Signed)
[facility]    CSN: [id number] Arrival date & time: 08/08/18  1135     History   Chief Complaint Chief Complaint  Patient presents with  . Cough  . Shortness of Breath    HPI Brenda Moreno is a 72 y.o. female.   72 year old female with history of CAD, CHF, DM, HLD, HTN, CVA comes in for 3-day history of cough and shortness of breath.  States she feels congested, and feels she needs to cough phlegm up, but cough is dry.  She denies rhinorrhea, nasal congestion.  Denies fever, chills, night sweats.  States she feels short of breath, worse when laying down, and has had orthopnea.  She denies chest pain, palpitation, weakness, dizziness, syncope.  She has heard mild wheezing.  Has mild leg swelling.  Denies dyspnea on exertion.  States usual weight is 186 pounds, and has been fluctuating up and down the past few weeks.  Did not get her weight today.  Last saw her cardiologist a few months ago, and states everything was stable.     Past Medical History:  Diagnosis Date  . Arthritis   . CAD (coronary artery disease)    S/P cabg  . Carotid artery occlusion   . Cataract   . CHF (congestive heart failure) ([place])   . DDD (degenerative disc disease), lumbar   . Diabetes mellitus   . Hyperlipidemia   . Hypertension   . Joint pain   . Leg pain   . Myocardial infarction ([place]) 1998  . Peripheral vascular disease ([place])   . PVD (peripheral vascular disease) ([place])   . Stroke [facility])     Patient Active Problem List   Diagnosis Date Noted  . CVA (cerebral vascular accident) ([place]) 01/16/2018  . Chest pain 12/22/2017  . Coronary artery disease involving native coronary artery of native heart without angina pectoris 06/02/2016  . DDD (degenerative disc disease), lumbar   . Diarrhea 07/02/2014  . Fecal incontinence 07/02/2014  . Aftercare following surgery of the circulatory system, [place] 09/26/2013  . 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 Acute sinus  infection 07/14/2013  . Arthritis   . Chronic diastolic heart failure ([place])   . Diabetes mellitus, type II, insulin dependent ([place])   . Hyperlipidemia   . Hypertension   . Myocardial infarction ([place])   . PVD (peripheral vascular disease) ([place])   . Peripheral vascular disease, unspecified ([place]) 09/27/2012  . Leg cramps 09/27/2012  . Atherosclerosis of native arteries of the extremities with intermittent claudication 09/22/2011  . Diabetes mellitus ([place]) 11/22/2007  . HYPERCHOLESTEROLEMIA 11/22/2007  . Benign essential HTN 11/22/2007  . HEART ATTACK 11/22/2007  . CAD (coronary artery disease) 11/22/2007  . RENAL CALCULUS 11/22/2007    Past Surgical History:  Procedure Laterality Date  . APPENDECTOMY    . BREAST EXCISIONAL BIOPSY Left 2008  . CHOLECYSTECTOMY     Gall bladder  . COLONOSCOPY  2008  . CORONARY ARTERY BYPASS GRAFT     1998  . LEFT HEART CATH AND CORS/GRAFTS ANGIOGRAPHY N/A 12/22/2017   Procedure: LEFT HEART CATH AND CORS/GRAFTS ANGIOGRAPHY;  Surgeon: Burnell Blanks, MD;  Location: [facility];  Service: Cardiovascular;  Laterality: N/A;  . PR VEIN BYPASS GRAFT,AORTO-FEM-POP    . TEE WITHOUT CARDIOVERSION N/A 01/19/2018   Procedure: TRANSESOPHAGEAL ECHOCARDIOGRAM (TEE);  Surgeon: Josue Hector, MD;  Location: [facility] ENDOSCOPY;  Service: Cardiovascular;  Laterality: N/A;  . TUBAL LIGATION  OB History   None      Home Medications    Prior to Admission medications   Medication Sig Start Date End Date Taking? Authorizing Provider  amLODipine (NORVASC) 5 MG tablet TAKE 1 TABLET BY MOUTH  DAILY 11/29/17   Susy Frizzle, MD  CINNAMON PO Take 1,000 mg by mouth 2 (two) times daily.      [provider]  clopidogrel (PLAVIX) 75 MG tablet TAKE 1 TABLET BY MOUTH EVERY DAY 02/19/18   Susy Frizzle, MD  Coenzyme Q10 (CO Q 10 PO) Take 100 mg by mouth daily.      [provider]  fluticasone (FLONASE) 50 MCG/ACT nasal spray Place 2  sprays into both nostrils daily as needed for allergies. 01/14/18   [provider]  furosemide (LASIX) 40 MG tablet Take 40 mg by mouth daily. Patient taking 0.5 tablets po qd.     [provider]  Insulin Isophane & Regular Human (HUMULIN 70/30 KWIKPEN) (70-30) 100 UNIT/ML PEN 15 units with breakfast and 7 units with supper Patient taking differently: 20 units qd 05/10/18   Dena Billet B, PA-C  metFORMIN (GLUCOPHAGE) 1000 MG tablet TAKE 1 TABLET BY MOUTH  TWICE A DAY WITH MEALS 02/13/18   Susy Frizzle, MD  nitroGLYCERIN (NITROSTAT) 0.4 MG SL tablet Place 1 tablet (0.4 mg total) under the tongue every 5 (five) minutes as needed for chest pain. 11/12/14   Susy Frizzle, MD  pioglitazone (ACTOS) 30 MG tablet TAKE 1 TABLET BY MOUTH  DAILY 02/13/18   Susy Frizzle, MD  Pitavastatin Calcium (LIVALO) 4 MG TABS Take 1 tablet (4 mg total) by mouth daily. 05/17/18   Susy Frizzle, MD  potassium chloride (K-DUR) 10 MEQ tablet Take 1 tablet (10 mEq total) by mouth daily. 06/02/16 02/21/18  Nahser, Wonda Cheng, MD  quinapril (ACCUPRIL) 40 MG tablet TAKE 1 TABLET BY MOUTH TWO  TIMES DAILY 02/13/18   Susy Frizzle, MD  traMADol (ULTRAM) 50 MG tablet Take 1 tablet (50 mg total) by mouth every 8 (eight) hours as needed. 01/09/18   Susy Frizzle, MD    Family History Family History  Problem Relation Age of Onset  . Heart disease Mother   . Diabetes Mother   . Hyperlipidemia Mother   . Hypertension Mother   . Stroke Mother   . Heart disease Father        Heart Disease before age 10  . Diabetes Father   . Hyperlipidemia Father   . Hypertension Father   . Heart disease Sister        heart attack  . Diabetes Sister        Amputation  . Hypertension Sister   . Other Sister        history of amputation  . Heart attack Sister   . Diabetes Brother   . Hypertension Brother   . Heart disease Brother 70       Before age 62  . Hyperlipidemia Brother   . Diabetes Brother         scirrosis of liver  . Coronary artery disease Other   . Colon cancer Neg Hx   . Stomach cancer Neg Hx   . Esophageal cancer Neg Hx     Social History Social History   Tobacco Use  . Smoking status: Former Smoker    Packs/day: 0.25    Types: Cigarettes    Last attempt to quit: 09/13/2011  Years since quitting: 6.9  . Smokeless tobacco: Never Used  Substance Use Topics  . Alcohol use: No  . Drug use: No     Allergies   Levaquin [levofloxacin in d5w]; Statins; and Tradjenta [linagliptin]   Review of Systems Review of Systems  Reason unable to perform ROS: See HPI as above.     Physical Exam Triage Vital Signs ED Triage Vitals  Enc Vitals Group     BP 08/08/18 1154 (!) 165/74     Pulse Rate 08/08/18 1154 (!) 52     Resp --      Temp 08/08/18 1154 97.7 F (36.5 C)     Temp Source 08/08/18 1154 Oral     SpO2 08/08/18 1154 97 %     Weight --      Height --      Head Circumference --      Peak Flow --      Pain Score 08/08/18 1155 0     Pain Loc --      Pain Edu? --      Excl. in [place]? --    No data found.  Updated Vital Signs BP (!) 165/74 (BP Location: Right Arm)   Pulse (!) 52   Temp 97.7 F (36.5 C) (Oral)   Wt 186 lb 3.2 oz (84.5 kg)   SpO2 97%   BMI 31.96 kg/m   Physical Exam  Constitutional: She is oriented to person, place, and time. She appears well-developed and well-nourished.  Non-toxic appearance. She does not appear ill. No distress.  HENT:  Head: Normocephalic and atraumatic.  Right Ear: Tympanic membrane, external ear and ear canal normal. Tympanic membrane is not erythematous and not bulging.  Left Ear: Tympanic membrane, external ear and ear canal normal. Tympanic membrane is not erythematous and not bulging.  Nose: Nose normal. Right sinus exhibits no maxillary sinus tenderness and no frontal sinus tenderness. Left sinus exhibits no maxillary sinus tenderness and no frontal sinus tenderness.  Mouth/Throat: Uvula is midline,  oropharynx is clear and moist and mucous membranes are normal.  Eyes: Pupils are equal, round, and reactive to light. Conjunctivae are normal.  Neck: Normal range of motion. Neck supple.  Cardiovascular: Regular rhythm and normal heart sounds. Bradycardia present. Exam reveals no gallop and no friction rub.  No murmur heard. Pulmonary/Chest: Effort normal and breath sounds normal. No accessory muscle usage. No respiratory distress. She has no decreased breath sounds. She has no wheezes. She has no rhonchi. She has no rales.  Speaking in full sentences without difficulty.   Musculoskeletal:  1+ pitting edema to mid shin.   Lymphadenopathy:    She has no cervical adenopathy.  Neurological: She is alert and oriented to person, place, and time.  Skin: Skin is warm and dry.  Psychiatric: She has a normal mood and affect. Her behavior is normal. Judgment normal.     UC Treatments / Results  Labs (all labs ordered are listed, but only abnormal results are displayed) Labs Reviewed - No data to display  EKG None  Radiology Dg Chest 2 View  Result Date: 08/08/2018 CLINICAL DATA:  Shortness of breath and cough EXAM: CHEST - 2 VIEW COMPARISON:  12/21/2017 FINDINGS: Borderline heart size. Status post CABG. Negative aortic and hilar contours. Mild scarring or atelectasis along the minor fissure. There is no edema, consolidation, effusion, or pneumothorax. IMPRESSION: No evidence of active disease. Electronically Signed   By: Monte Fantasia M.D.   On: 08/08/2018 13:29  Procedures Procedures (including critical care time)  Medications Ordered in UC Medications - No data to display  Initial Impression / Assessment and Plan / UC Course  I have reviewed the triage vital signs and the nursing notes.  Pertinent labs & imaging results that were available during my care of the patient were reviewed by me and considered in my medical decision making (see chart for details).    CXR without  evidence of active disease, negative for edema, consolidation. EKG sinus bradycardia, 56bpm, no acute ST changes, unchanged from prior. EKG reviewed by Dr Mannie Stabile. Weight today 186, seems to be at baseline. Patient continues to talk in full sentences, without any respiratory distress. Given +1 edema with orthopnea, discussed treating for possible exacerbation of CHF. Will increase lasix dosage from 20mg  daily, to 40-60mg  daily. Will have patient monitor weight and fluid intake closely. Follow up with PCP/cardiology for reevaluation in 3-4 days. Strict return precautions given. Patient expresses understanding and agrees to plan.  Case discussed with Dr Harlin Heys, who agrees to plan.  Final Clinical Impressions(s) / UC Diagnoses   Final diagnoses:  Shortness of breath  Cough    ED Prescriptions    None        Ok Edwards, PA-C 08/08/18 1405

## 2018-08-11 ENCOUNTER — Inpatient Hospital Stay (HOSPITAL_COMMUNITY)
Admission: EM | Admit: 2018-08-11 | Discharge: 2018-08-12 | DRG: 291 | Disposition: A | Discharge status: Home / Self Care | Payer: Medicare Other | Attending: Internal Medicine | Admitting: Internal Medicine

## 2018-08-11 ENCOUNTER — Emergency Department (HOSPITAL_COMMUNITY): Payer: Medicare Other

## 2018-08-11 ENCOUNTER — Other Ambulatory Visit: Payer: Self-pay

## 2018-08-11 ENCOUNTER — Encounter (HOSPITAL_COMMUNITY): Payer: Self-pay

## 2018-08-11 DIAGNOSIS — R0789 Other chest pain: Secondary | ICD-10-CM | POA: Diagnosis not present

## 2018-08-11 DIAGNOSIS — Z881 Allergy status to other antibiotic agents status: Secondary | ICD-10-CM | POA: Diagnosis not present

## 2018-08-11 DIAGNOSIS — R079 Chest pain, unspecified: Secondary | ICD-10-CM | POA: Diagnosis not present

## 2018-08-11 DIAGNOSIS — I69354 Hemiplegia and hemiparesis following cerebral infarction affecting left non-dominant side: Secondary | ICD-10-CM

## 2018-08-11 DIAGNOSIS — E1151 Type 2 diabetes mellitus with diabetic peripheral angiopathy without gangrene: Secondary | ICD-10-CM | POA: Diagnosis present

## 2018-08-11 DIAGNOSIS — R001 Bradycardia, unspecified: Secondary | ICD-10-CM | POA: Diagnosis not present

## 2018-08-11 DIAGNOSIS — E1165 Type 2 diabetes mellitus with hyperglycemia: Secondary | ICD-10-CM | POA: Diagnosis not present

## 2018-08-11 DIAGNOSIS — Z7902 Long term (current) use of antithrombotics/antiplatelets: Secondary | ICD-10-CM

## 2018-08-11 DIAGNOSIS — E785 Hyperlipidemia, unspecified: Secondary | ICD-10-CM | POA: Diagnosis present

## 2018-08-11 DIAGNOSIS — Z888 Allergy status to other drugs, medicaments and biological substances status: Secondary | ICD-10-CM | POA: Diagnosis not present

## 2018-08-11 DIAGNOSIS — I130 Hypertensive heart and chronic kidney disease with heart failure and stage 1 through stage 4 chronic kidney disease, or unspecified chronic kidney disease: Principal | ICD-10-CM | POA: Diagnosis present

## 2018-08-11 DIAGNOSIS — R0600 Dyspnea, unspecified: Secondary | ICD-10-CM | POA: Diagnosis not present

## 2018-08-11 DIAGNOSIS — I739 Peripheral vascular disease, unspecified: Secondary | ICD-10-CM | POA: Diagnosis present

## 2018-08-11 DIAGNOSIS — Z95820 Peripheral vascular angioplasty status with implants and grafts: Secondary | ICD-10-CM

## 2018-08-11 DIAGNOSIS — Z794 Long term (current) use of insulin: Secondary | ICD-10-CM | POA: Diagnosis not present

## 2018-08-11 DIAGNOSIS — I2510 Atherosclerotic heart disease of native coronary artery without angina pectoris: Secondary | ICD-10-CM | POA: Diagnosis not present

## 2018-08-11 DIAGNOSIS — I6529 Occlusion and stenosis of unspecified carotid artery: Secondary | ICD-10-CM | POA: Diagnosis not present

## 2018-08-11 DIAGNOSIS — D649 Anemia, unspecified: Secondary | ICD-10-CM | POA: Diagnosis present

## 2018-08-11 DIAGNOSIS — I252 Old myocardial infarction: Secondary | ICD-10-CM | POA: Diagnosis not present

## 2018-08-11 DIAGNOSIS — N183 Chronic kidney disease, stage 3 (moderate): Secondary | ICD-10-CM | POA: Diagnosis not present

## 2018-08-11 DIAGNOSIS — I509 Heart failure, unspecified: Secondary | ICD-10-CM | POA: Diagnosis not present

## 2018-08-11 DIAGNOSIS — I5033 Acute on chronic diastolic (congestive) heart failure: Secondary | ICD-10-CM | POA: Diagnosis present

## 2018-08-11 DIAGNOSIS — R0602 Shortness of breath: Secondary | ICD-10-CM | POA: Diagnosis not present

## 2018-08-11 DIAGNOSIS — E119 Type 2 diabetes mellitus without complications: Secondary | ICD-10-CM

## 2018-08-11 DIAGNOSIS — Z8673 Personal history of transient ischemic attack (TIA), and cerebral infarction without residual deficits: Secondary | ICD-10-CM

## 2018-08-11 DIAGNOSIS — M5136 Other intervertebral disc degeneration, lumbar region: Secondary | ICD-10-CM | POA: Diagnosis present

## 2018-08-11 DIAGNOSIS — Z951 Presence of aortocoronary bypass graft: Secondary | ICD-10-CM | POA: Diagnosis not present

## 2018-08-11 DIAGNOSIS — Z743 Need for continuous supervision: Secondary | ICD-10-CM | POA: Diagnosis not present

## 2018-08-11 DIAGNOSIS — Z79899 Other long term (current) drug therapy: Secondary | ICD-10-CM

## 2018-08-11 DIAGNOSIS — Z87891 Personal history of nicotine dependence: Secondary | ICD-10-CM | POA: Diagnosis not present

## 2018-08-11 DIAGNOSIS — I110 Hypertensive heart disease with heart failure: Secondary | ICD-10-CM | POA: Diagnosis not present

## 2018-08-11 DIAGNOSIS — R05 Cough: Secondary | ICD-10-CM | POA: Diagnosis not present

## 2018-08-11 DIAGNOSIS — I10 Essential (primary) hypertension: Secondary | ICD-10-CM

## 2018-08-11 DIAGNOSIS — D631 Anemia in chronic kidney disease: Secondary | ICD-10-CM | POA: Diagnosis present

## 2018-08-11 LAB — COMPREHENSIVE METABOLIC PANEL
ALBUMIN: 3.1 g/dL — AB (ref 3.5–5.0)
ALT: 31 U/L (ref 0–44)
AST: 30 U/L (ref 15–41)
Alkaline Phosphatase: 100 U/L (ref 38–126)
Anion gap: 9 (ref 5–15)
BUN: 17 mg/dL (ref 8–23)
CO2: 29 mmol/L (ref 22–32)
Calcium: 8.8 mg/dL — ABNORMAL LOW (ref 8.9–10.3)
Chloride: 104 mmol/L (ref 98–111)
Creatinine, Ser: 1.07 mg/dL — ABNORMAL HIGH (ref 0.44–1.00)
GFR calc Af Amer: >60 mL/min (ref >60)
GFR calc non Af Amer: 53 mL/min — ABNORMAL LOW (ref >60)
Glucose, Bld: 232 mg/dL — ABNORMAL HIGH (ref 70–99)
Potassium: 3.7 mmol/L (ref 3.5–5.1)
Sodium: 142 mmol/L (ref 135–145)
TOTAL PROTEIN: 6.2 g/dL — AB (ref 6.5–8.1)
Total Bilirubin: 0.5 mg/dL (ref 0.3–1.2)

## 2018-08-11 LAB — CBC
HCT: 32.9 % — ABNORMAL LOW (ref 36.0–46.0)
Hemoglobin: 10.6 g/dL — ABNORMAL LOW (ref 12.0–15.0)
MCH: 26.9 pg (ref 26.0–34.0)
MCHC: 32.2 g/dL (ref 30.0–36.0)
MCV: 83.5 fL (ref 80.0–100.0)
Platelets: 201 10*3/uL (ref 150–400)
RBC: 3.94 MIL/uL (ref 3.87–5.11)
RDW: 16.2 % — ABNORMAL HIGH (ref 11.5–15.5)
WBC: 7.5 10*3/uL (ref 4.0–10.5)
nRBC: 0 % (ref 0.0–0.2)

## 2018-08-11 LAB — BRAIN NATRIURETIC PEPTIDE: B Natriuretic Peptide: 487.4 pg/mL — ABNORMAL HIGH (ref 0.0–100.0)

## 2018-08-11 LAB — TSH: TSH: 2.531 u[IU]/mL (ref 0.350–4.500)

## 2018-08-11 LAB — I-STAT TROPONIN, ED: Troponin i, poc: 0.01 ng/mL (ref 0.00–0.08)

## 2018-08-11 LAB — GLUCOSE, CAPILLARY
GLUCOSE-CAPILLARY: 176 mg/dL — AB (ref 70–99)
Glucose-Capillary: 316 mg/dL — ABNORMAL HIGH (ref 70–99)

## 2018-08-11 LAB — CBG MONITORING, ED: GLUCOSE-CAPILLARY: 203 mg/dL — AB (ref 70–99)

## 2018-08-11 MED ORDER — INSULIN ASPART 100 UNIT/ML ~~LOC~~ SOLN
0.0000 [IU] | Freq: Every day | SUBCUTANEOUS | Status: DC
  Administered 2018-08-11: 4 [IU] via SUBCUTANEOUS

## 2018-08-11 MED ORDER — ASPIRIN 81 MG PO CHEW
324.0000 mg | CHEWABLE_TABLET | Freq: Once | ORAL | Status: AC
  Administered 2018-08-11: 324 mg via ORAL
  Filled 2018-08-11: qty 4

## 2018-08-11 MED ORDER — FUROSEMIDE 10 MG/ML IJ SOLN
60.0000 mg | Freq: Two times a day (BID) | INTRAMUSCULAR | Status: DC
  Administered 2018-08-11 – 2018-08-12 (×2): 60 mg via INTRAVENOUS
  Filled 2018-08-11 (×2): qty 6

## 2018-08-11 MED ORDER — HYDRALAZINE HCL 20 MG/ML IJ SOLN
10.0000 mg | Freq: Four times a day (QID) | INTRAMUSCULAR | Status: DC | PRN
  Filled 2018-08-11: qty 1

## 2018-08-11 MED ORDER — QUINAPRIL HCL 10 MG PO TABS: 40.0000 mg | ORAL_TABLET | Freq: Two times a day (BID) | ORAL | Status: DC

## 2018-08-11 MED ORDER — TRAMADOL HCL 50 MG PO TABS: 50.0000 mg | ORAL_TABLET | Freq: Three times a day (TID) | ORAL | Status: DC | PRN

## 2018-08-11 MED ORDER — FUROSEMIDE 10 MG/ML IJ SOLN
60.0000 mg | Freq: Once | INTRAMUSCULAR | Status: AC
  Administered 2018-08-11: 60 mg via INTRAVENOUS
  Filled 2018-08-11: qty 8

## 2018-08-11 MED ORDER — ENOXAPARIN SODIUM 40 MG/0.4ML ~~LOC~~ SOLN
40.0000 mg | SUBCUTANEOUS | Status: DC
  Administered 2018-08-11: 40 mg via SUBCUTANEOUS
  Filled 2018-08-11: qty 0.4

## 2018-08-11 MED ORDER — PITAVASTATIN CALCIUM 4 MG PO TABS: 4.0000 mg | ORAL_TABLET | Freq: Every day | ORAL | Status: DC

## 2018-08-11 MED ORDER — FLUTICASONE PROPIONATE 50 MCG/ACT NA SUSP
2.0000 | Freq: Every day | NASAL | Status: DC | PRN
  Filled 2018-08-11: qty 16

## 2018-08-11 MED ORDER — ONDANSETRON HCL 4 MG PO TABS: 4.0000 mg | ORAL_TABLET | Freq: Four times a day (QID) | ORAL | Status: DC | PRN

## 2018-08-11 MED ORDER — ACETAMINOPHEN 650 MG RE SUPP: 650.0000 mg | Freq: Four times a day (QID) | RECTAL | Status: DC | PRN

## 2018-08-11 MED ORDER — INSULIN GLARGINE 100 UNIT/ML ~~LOC~~ SOLN
20.0000 [IU] | Freq: Every day | SUBCUTANEOUS | Status: DC
  Administered 2018-08-11: 20 [IU] via SUBCUTANEOUS
  Filled 2018-08-11 (×2): qty 0.2

## 2018-08-11 MED ORDER — NITROGLYCERIN 0.4 MG SL SUBL: 0.4000 mg | SUBLINGUAL_TABLET | SUBLINGUAL | Status: DC | PRN

## 2018-08-11 MED ORDER — CLOPIDOGREL BISULFATE 75 MG PO TABS
75.0000 mg | ORAL_TABLET | Freq: Every day | ORAL | Status: DC
  Administered 2018-08-12: 75 mg via ORAL
  Filled 2018-08-11: qty 1

## 2018-08-11 MED ORDER — ACETAMINOPHEN 325 MG PO TABS: 650.0000 mg | ORAL_TABLET | Freq: Four times a day (QID) | ORAL | Status: DC | PRN

## 2018-08-11 MED ORDER — ONDANSETRON HCL 4 MG/2ML IJ SOLN: 4.0000 mg | Freq: Four times a day (QID) | INTRAMUSCULAR | Status: DC | PRN

## 2018-08-11 MED ORDER — POTASSIUM CHLORIDE ER 10 MEQ PO TBCR
20.0000 meq | EXTENDED_RELEASE_TABLET | Freq: Every day | ORAL | Status: DC
  Administered 2018-08-12: 20 meq via ORAL
  Filled 2018-08-11 (×2): qty 2

## 2018-08-11 MED ORDER — AMLODIPINE BESYLATE 5 MG PO TABS
5.0000 mg | ORAL_TABLET | Freq: Every day | ORAL | Status: DC
  Administered 2018-08-12: 5 mg via ORAL
  Filled 2018-08-11: qty 1

## 2018-08-11 MED ORDER — PRAVASTATIN SODIUM 40 MG PO TABS: 80.0000 mg | ORAL_TABLET | Freq: Every day | ORAL | Status: DC

## 2018-08-11 MED ORDER — INSULIN ASPART 100 UNIT/ML ~~LOC~~ SOLN
0.0000 [IU] | Freq: Three times a day (TID) | SUBCUTANEOUS | Status: DC
  Administered 2018-08-12: 2 [IU] via SUBCUTANEOUS
  Administered 2018-08-12: 5 [IU] via SUBCUTANEOUS

## 2018-08-11 MED ORDER — ALBUTEROL SULFATE (2.5 MG/3ML) 0.083% IN NEBU
5.0000 mg | INHALATION_SOLUTION | Freq: Once | RESPIRATORY_TRACT | Status: AC
  Administered 2018-08-11: 5 mg via RESPIRATORY_TRACT
  Filled 2018-08-11: qty 6

## 2018-08-11 MED ORDER — LISINOPRIL 20 MG PO TABS
40.0000 mg | ORAL_TABLET | Freq: Two times a day (BID) | ORAL | Status: DC
  Administered 2018-08-11 – 2018-08-12 (×2): 40 mg via ORAL
  Filled 2018-08-11 (×2): qty 2

## 2018-08-11 NOTE — ED Triage Notes (Addendum)
Patient presented to ed with c/o sob and coughing since yesterday. Patient was see at PCP wed nothing was given to her. She state she been having fever last night and she took tylenol and today no fever.10 of albuterol and 0.5 of Atrovent per ems.

## 2018-08-11 NOTE — ED Notes (Signed)
ED TO INPATIENT HANDOFF REPORT  Name/Age/Gender Brenda Moreno 72 y.o. female  Code Status Code Status History    Date Active Date Inactive Code Status Order ID Comments User Context   01/17/2018 0128 01/19/2018 2144 Full Code [id number]  Bethena Roys, MD ED   12/22/2017 0040 12/23/2017 1633 Full Code [id number]  Norval Morton, MD ED    Advance Directive Documentation     Most Recent Value  Type of Advance Directive  Living will  Pre-existing out of facility DNR order (yellow form or pink MOST form)  -  "MOST" Form in Place?  -      Home/SNF/Other Home  Chief Complaint Respiratory distress   Level of Care/Admitting Diagnosis ED Disposition    ED Disposition Condition [place]: [facility] [100102]  Level of Care: Telemetry [5]  Admit to tele based on following criteria: Complex arrhythmia (Bradycardia/Tachycardia)  Admit to tele based on following criteria: Acute CHF  Diagnosis: Acute CHF [facility]) [016010]  Admitting Physician: Louellen Molder [phone]  Attending Physician: Louellen Molder [phone]  Estimated length of stay: past midnight tomorrow  Certification:: I certify this patient will need inpatient services for at least 2 midnights  PT Class (Do Not Modify): Inpatient [101]  PT Acc Code (Do Not Modify): Private [1]       Medical History Past Medical History:  Diagnosis Date  . Arthritis   . CAD (coronary artery disease)    S/P cabg  . Carotid artery occlusion   . Cataract   . CHF (congestive heart failure) ([place])   . DDD (degenerative disc disease), lumbar   . Diabetes mellitus   . Hyperlipidemia   . Hypertension   . Joint pain   . Leg pain   . Myocardial infarction ([place]) 1998  . Peripheral vascular disease ([place])   . PVD (peripheral vascular disease) ([place])   . Stroke [facility])     Allergies Allergies  Allergen Reactions  . Levaquin [Levofloxacin In D5w] Other (See Comments)    Pain all over and in joints   . Statins Other (See Comments)    Severe myalgias to lipitor, crestor, pravastatin and weakness and cramping  in bilateral leg.  [name] [Linagliptin] Itching    IV Location/Drains/Wounds Patient Lines/Drains/Airways Status   Active Line/Drains/Airways    Name:   Placement date:   Placement time:   Site:   Days:   Peripheral IV 08/11/18 Left Antecubital   08/11/18    -    Antecubital   less than 1          Labs/Imaging Results for orders placed or performed during the hospital encounter of 08/11/18 (from the past 48 hour(s))  CBG monitoring, ED     Status: Abnormal   Collection Time: 08/11/18 10:51 AM  Result Value Ref Range   Glucose-Capillary 203 (H) 70 - 99 mg/dL  CBC     Status: Abnormal   Collection Time: 08/11/18 11:10 AM  Result Value Ref Range   WBC 7.5 4.0 - 10.5 K/uL   RBC 3.94 3.87 - 5.11 MIL/uL   Hemoglobin 10.6 (L) 12.0 - 15.0 g/dL   HCT 32.9 (L) 36.0 - 46.0 %   MCV 83.5 80.0 - 100.0 fL   MCH 26.9 26.0 - 34.0 pg   MCHC 32.2 30.0 - 36.0 g/dL   RDW 16.2 (H) 11.5 - 15.5 %   Platelets 201 150 - 400 K/uL   nRBC 0.0 0.0 -  0.2 %    Comment: Performed at [facility], [place] [address], [place], [place] [address]  Comprehensive metabolic panel     Status: Abnormal   Collection Time: 08/11/18 11:10 AM  Result Value Ref Range   Sodium 142 135 - 145 mmol/L   Potassium 3.7 3.5 - 5.1 mmol/L   Chloride 104 98 - 111 mmol/L   CO2 29 22 - 32 mmol/L   Glucose, Bld 232 (H) 70 - 99 mg/dL   BUN 17 8 - 23 mg/dL   Creatinine, Ser 1.07 (H) 0.44 - 1.00 mg/dL   Calcium 8.8 (L) 8.9 - 10.3 mg/dL   Total Protein 6.2 (L) 6.5 - 8.1 g/dL   Albumin 3.1 (L) 3.5 - 5.0 g/dL   AST 30 15 - 41 U/L   ALT 31 0 - 44 U/L   Alkaline Phosphatase 100 38 - 126 U/L   Total Bilirubin 0.5 0.3 - 1.2 mg/dL   GFR calc non Af Amer 53 (L) >60 mL/min   GFR calc Af Amer >60 >60 mL/min   Anion gap 9 5 - 15    Comment: Performed at [facility], [place] [address],  [place], [place] [address]  Brain natriuretic peptide     Status: Abnormal   Collection Time: 08/11/18 11:10 AM  Result Value Ref Range   B Natriuretic Peptide 487.4 (H) 0.0 - 100.0 pg/mL    Comment: Performed at [facility], [place] [address], [place],  [address]  I-stat troponin, ED     Status: None   Collection Time: 08/11/18 11:13 AM  Result Value Ref Range   Troponin i, poc 0.01 0.00 - 0.08 ng/mL   Comment 3            Comment: Due to the release kinetics of cTnI, a negative result within the first hours of the onset of symptoms does not rule out myocardial infarction with certainty. If myocardial infarction is still suspected, repeat the test at appropriate intervals.    Dg Chest Port 1 View  Result Date: 08/11/2018 CLINICAL DATA:  Shortness of breath and coughing for 1 day. EXAM: PORTABLE CHEST 1 VIEW COMPARISON:  08/08/2018 and 12/21/2017. FINDINGS: 1324 hours. The heart size and mediastinal contours are stable status post median sternotomy and CABG. There is mild chronic vascular congestion without overt pulmonary edema, confluent airspace opacity, pleural effusion or pneumothorax. No acute osseous findings are seen status post lower cervical fusion. Telemetry leads overlie the chest. IMPRESSION: Stable postoperative chest. Vascular congestion without overt pulmonary edema. Electronically Signed   By: Richardean Sale M.D.   On: 08/11/2018 14:21   EKG Interpretation  Date/Time:  Saturday August 11 2018 10:45:43 EST Ventricular Rate:  45 PR Interval:    QRS Duration: 96 QT Interval:  493 QTC Calculation: 427 R Axis:   38 Text Interpretation:  Bradycardia with irregular rate Confirmed by Pattricia Boss [phone]) on 08/11/2018 12:10:07 PM   Pending Labs Unresulted Labs (From admission, onward)    Start     Ordered   Signed and Held  HIV antibody (Routine Testing)  Once,   R     Signed and Held   Signed and Held  CBC  (enoxaparin (LOVENOX)    CrCl >/= 30  ml/min)  Once,   R    Comments:  Baseline for enoxaparin therapy IF NOT ALREADY DRAWN.  Notify MD if PLT < 100 K.    Signed and Held   Signed and  Held  Creatinine, serum  (enoxaparin (LOVENOX)    CrCl >/= 30 ml/min)  Once,   R    Comments:  Baseline for enoxaparin therapy IF NOT ALREADY DRAWN.    Signed and Held   Signed and Held  Creatinine, serum  (enoxaparin (LOVENOX)    CrCl >/= 30 ml/min)  Weekly,   R    Comments:  while on enoxaparin therapy    Signed and Held   Signed and Held  Basic metabolic panel  Daily,   R     Signed and Held          Vitals/Pain Today's Vitals   08/11/18 1233 08/11/18 1300 08/11/18 1345 08/11/18 1400  BP:  (!) 174/55  (!) 169/49  Pulse:  (!) 53 (!) 52 (!) 48  Resp:  (!) 21 (!) 24 (!) 21  Temp:      TempSrc:      SpO2:  90% 95% 95%  PainSc: 0-No pain       Isolation Precautions No active isolations  Medications Medications  aspirin chewable tablet 324 mg (324 mg Oral Given 08/11/18 1100)  albuterol (PROVENTIL) (2.5 MG/3ML) 0.083% nebulizer solution 5 mg (5 mg Nebulization Given 08/11/18 1059)  furosemide (LASIX) injection 60 mg (60 mg Intravenous Given 08/11/18 1413)    Mobility walks

## 2018-08-11 NOTE — ED Notes (Signed)
Bed: WA21 Expected date:  Expected time:  Means of arrival:  Comments: SHOB wheezing, tx by EMS

## 2018-08-11 NOTE — ED Provider Notes (Signed)
[facility] DEPT Provider Note   CSN: [id number] Arrival date & time: 08/11/18  1028     History   Chief Complaint No chief complaint on file.   HPI Brenda Moreno is a 72 y.o. female.  HPI  72 yo female ho cad, chf, mi, stroke with residual left sided weakness presents today with cough congestion, chest feels full.  Patient states she had some dry cough and low grade temp to 100 on Monday.  Seen at urgent care and cxr and told to increase lasix to 40 mg.  Patient states she does not like to increase lasix because she can't make it to the bathroom.  She continues to have cough and feel "full" in chest.  No fever but felt warm last night although she thinks this may be due to low bs as she has not had a good appetite.    Past Medical History:  Diagnosis Date  . Arthritis   . CAD (coronary artery disease)    S/P cabg  . Carotid artery occlusion   . Cataract   . CHF (congestive heart failure) ([place])   . DDD (degenerative disc disease), lumbar   . Diabetes mellitus   . Hyperlipidemia   . Hypertension   . Joint pain   . Leg pain   . Myocardial infarction ([place]) 1998  . Peripheral vascular disease ([place])   . PVD (peripheral vascular disease) ([place])   . Stroke [facility])     Patient Active Problem List   Diagnosis Date Noted  . CVA (cerebral vascular accident) ([place]) 01/16/2018  . Chest pain 12/22/2017  . Coronary artery disease involving native coronary artery of native heart without angina pectoris 06/02/2016  . DDD (degenerative disc disease), lumbar   . Diarrhea 07/02/2014  . Fecal incontinence 07/02/2014  . Aftercare following surgery of the circulatory system, [place] 09/26/2013  . 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 Acute sinus infection 07/14/2013  . Arthritis   . Chronic diastolic heart failure ([place])   . Diabetes mellitus, type II, insulin dependent ([place])   . Hyperlipidemia   . Hypertension   . Myocardial infarction  ([place])   . PVD (peripheral vascular disease) ([place])   . Peripheral vascular disease, unspecified ([place]) 09/27/2012  . Leg cramps 09/27/2012  . Atherosclerosis of native arteries of the extremities with intermittent claudication 09/22/2011  . Diabetes mellitus ([place]) 11/22/2007  . HYPERCHOLESTEROLEMIA 11/22/2007  . Benign essential HTN 11/22/2007  . HEART ATTACK 11/22/2007  . CAD (coronary artery disease) 11/22/2007  . RENAL CALCULUS 11/22/2007    Past Surgical History:  Procedure Laterality Date  . APPENDECTOMY    . BREAST EXCISIONAL BIOPSY Left 2008  . CHOLECYSTECTOMY     Gall bladder  . COLONOSCOPY  2008  . CORONARY ARTERY BYPASS GRAFT     1998  . LEFT HEART CATH AND CORS/GRAFTS ANGIOGRAPHY N/A 12/22/2017   Procedure: LEFT HEART CATH AND CORS/GRAFTS ANGIOGRAPHY;  Surgeon: Burnell Blanks, MD;  Location: [facility] CV LAB;  Service: Cardiovascular;  Laterality: N/A;  . PR VEIN BYPASS GRAFT,AORTO-FEM-POP    . TEE WITHOUT CARDIOVERSION N/A 01/19/2018   Procedure: TRANSESOPHAGEAL ECHOCARDIOGRAM (TEE);  Surgeon: Josue Hector, MD;  Location: [facility] ENDOSCOPY;  Service: Cardiovascular;  Laterality: N/A;  . TUBAL LIGATION       OB History   No obstetric history on file.      Home Medications    Prior to Admission medications   Medication Sig Start  Date End Date Taking? Authorizing Provider  amLODipine (NORVASC) 5 MG tablet TAKE 1 TABLET BY MOUTH  DAILY 11/29/17   Susy Frizzle, MD  CINNAMON PO Take 1,000 mg by mouth 2 (two) times daily.      [provider]  clopidogrel (PLAVIX) 75 MG tablet TAKE 1 TABLET BY MOUTH EVERY DAY 02/19/18   Susy Frizzle, MD  Coenzyme Q10 (CO Q 10 PO) Take 100 mg by mouth daily.      [provider]  fluticasone (FLONASE) 50 MCG/ACT nasal spray Place 2 sprays into both nostrils daily as needed for allergies. 01/14/18   [provider]  furosemide (LASIX) 40 MG tablet Take 40 mg by mouth daily. Patient taking 0.5  tablets po qd.     [provider]  Insulin Isophane & Regular Human (HUMULIN 70/30 KWIKPEN) (70-30) 100 UNIT/ML PEN 15 units with breakfast and 7 units with supper Patient taking differently: 20 units qd 05/10/18   Dena Billet B, PA-C  metFORMIN (GLUCOPHAGE) 1000 MG tablet TAKE 1 TABLET BY MOUTH  TWICE A DAY WITH MEALS 02/13/18   Susy Frizzle, MD  nitroGLYCERIN (NITROSTAT) 0.4 MG SL tablet Place 1 tablet (0.4 mg total) under the tongue every 5 (five) minutes as needed for chest pain. 11/12/14   Susy Frizzle, MD  pioglitazone (ACTOS) 30 MG tablet TAKE 1 TABLET BY MOUTH  DAILY 02/13/18   Susy Frizzle, MD  Pitavastatin Calcium (LIVALO) 4 MG TABS Take 1 tablet (4 mg total) by mouth daily. 05/17/18   Susy Frizzle, MD  potassium chloride (K-DUR) 10 MEQ tablet Take 1 tablet (10 mEq total) by mouth daily. 06/02/16 02/21/18  Nahser, Wonda Cheng, MD  quinapril (ACCUPRIL) 40 MG tablet TAKE 1 TABLET BY MOUTH TWO  TIMES DAILY 02/13/18   Susy Frizzle, MD  traMADol (ULTRAM) 50 MG tablet Take 1 tablet (50 mg total) by mouth every 8 (eight) hours as needed. 01/09/18   Susy Frizzle, MD    Family History Family History  Problem Relation Age of Onset  . Heart disease Mother   . Diabetes Mother   . Hyperlipidemia Mother   . Hypertension Mother   . Stroke Mother   . Heart disease Father        Heart Disease before age 36  . Diabetes Father   . Hyperlipidemia Father   . Hypertension Father   . Heart disease Sister        heart attack  . Diabetes Sister        Amputation  . Hypertension Sister   . Other Sister        history of amputation  . Heart attack Sister   . Diabetes Brother   . Hypertension Brother   . Heart disease Brother 50       Before age 36  . Hyperlipidemia Brother   . Diabetes Brother        scirrosis of liver  . Coronary artery disease Other   . Colon cancer Neg Hx   . Stomach cancer Neg Hx   . Esophageal cancer Neg Hx     Social History Social  History   Tobacco Use  . Smoking status: Former Smoker    Packs/day: 0.25    Types: Cigarettes    Last attempt to quit: 09/13/2011    Years since quitting: 6.9  . Smokeless tobacco: Never Used  Substance Use Topics  . Alcohol use: No  . Drug use: No  Allergies   Levaquin [levofloxacin in d5w]; Statins; and Tradjenta [linagliptin]   Review of Systems Review of Systems  Constitutional: Positive for appetite change, chills, fatigue, fever and unexpected weight change.  HENT: Negative.  Negative for congestion.   Eyes: Negative.   Respiratory: Positive for cough and shortness of breath.   Cardiovascular: Positive for chest pain.  Gastrointestinal: Negative.   Endocrine: Negative.   Genitourinary: Positive for frequency.  Musculoskeletal: Negative.   Skin: Negative.   Allergic/Immunologic: Negative.   Neurological: Negative.   Hematological: Negative.   Psychiatric/Behavioral: Negative.   All other systems reviewed and are negative.    Physical Exam Updated Vital Signs There were no vitals taken for this visit.  Physical Exam Vitals signs and nursing note reviewed. Exam conducted with a chaperone present.  Constitutional:      Appearance: Normal appearance.  HENT:     Head: Normocephalic and atraumatic.     Right Ear: Tympanic membrane, ear canal and external ear normal.     Left Ear: Tympanic membrane, ear canal and external ear normal.     Nose: Nose normal.     Mouth/Throat:     Mouth: Mucous membranes are moist.     Pharynx: Oropharynx is clear.  Eyes:     Extraocular Movements: Extraocular movements intact.     Pupils: Pupils are equal, round, and reactive to light.  Neck:     Musculoskeletal: Normal range of motion and neck supple.  Cardiovascular:     Rate and Rhythm: Normal rate and regular rhythm.     Pulses: Normal pulses.     Heart sounds: Normal heart sounds.  Pulmonary:     Effort: Pulmonary effort is normal.     Breath sounds: Rhonchi  present.     Comments: Bilateral lower lobe rhonchi Abdominal:     General: Abdomen is flat.  Musculoskeletal: Normal range of motion.        General: No swelling.     Right lower leg: Edema present.     Left lower leg: Edema present.  Skin:    General: Skin is warm and dry.     Capillary Refill: Capillary refill takes less than 2 seconds.  Neurological:     General: No focal deficit present.     Mental Status: She is alert.  Psychiatric:        Mood and Affect: Mood normal.      ED Treatments / Results  Labs (all labs ordered are listed, but only abnormal results are displayed) Labs Reviewed - No data to display  EKG EKG Interpretation  Date/Time:  Saturday August 11 2018 10:45:43 EST Ventricular Rate:  45 PR Interval:    QRS Duration: 96 QT Interval:  493 QTC Calculation: 427 R Axis:   38 Text Interpretation:  Bradycardia with irregular rate Confirmed by Pattricia Boss [phone]) on 08/11/2018 12:10:07 PM   Radiology No results found.  Procedures Procedures (including critical care time)  Medications Ordered in ED Medications - No data to display   Initial Impression / Assessment and Plan / ED Course  I have reviewed the triage vital signs and the nursing notes.  Pertinent labs & imaging results that were available during my care of the patient were reviewed by me and considered in my medical decision making (see chart for details).    This is a 72 year old female history of CHF stay with cough, congestion, chest heaviness.  On evaluation she has significant bradycardia on her EKG which is more  pronounce from prior  Discussed with Dr. Valinda Party and he will see for admission  Final Clinical Impressions(s) / ED Diagnoses   Final diagnoses:  Dyspnea, unspecified type  Acute on chronic congestive heart failure, unspecified heart failure type [facility])  Bradycardia    ED Discharge Orders    None       Pattricia Boss, MD 08/11/18 1503

## 2018-08-11 NOTE — H&P (Signed)
TRH H&P   Patient Demographics:    Brenda Moreno, is a 72 y.o. female  MRN: [id number]   DOB - [birth year]  Admit Date - 08/11/2018  Outpatient Primary MD for the patient is Susy Frizzle, MD  Referring MD: Dr. Jeanell Sparrow  Outpatient Specialists: Dr. Acie Fredrickson  Patient coming from: Home       HPI:    Brenda Moreno  is a 72 y.o. 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  She reports she was taking care of her brother who is a cancer patient and possibly acquired some URI symptoms with productive white phlegm.  She denies any fevers or chills, headache, dizziness or palpitations.  For past 4-5 days she started developing chest heaviness and increasing dyspnea on exertion which progressed to having orthopnea and unable to sleep flat at night.  She went to her PCP office 3 days back where she was instructed to take increased dose of Lasix (40 mg daily instead of 20 mg she is on).  She called her cardiology office and got scheduled to be seen next week only.  However with concern that she has to get up in the night and urinate often on high dose of Lasix she did not take increased dose and continue to take 20 mg daily. Patient denies any fevers, chills, abdominal pain, dysuria, diarrhea, tingling or numbness of extremities, fall or syncope.  No change in appetite but patient feels she may have gained a few pounds.  Course in the ED Patient was afebrile, respiratory rate in the 20s, blood pressure elevated to 194/103 mmHg, O2 sat stable on room air.  However she was bradycardic to 42 but asymptomatic. Blood work showed WBC  of 7.5, hemoglobin of 10.6, platelet of 201, normal electrolytes and renal function, glucose of 232 and BNP close to 500 chest x-ray showed pulmonary vascular congestion.  EKG showed sinus bradycardia at 45, no ST-T changes.  Patient given 60 mg IV Lasix and hospitalist consulted for admission to telemetry for acute on chronic diastolic CHF and sinus bradycardia    Review of systems:    In addition to the HPI above, (positive symptoms in bold) No Fever-chills, No Headache, No changes with Vision or hearing, No problems swallowing food or Liquids, Chest tightness, productive cough, shortness of breath, orthopnea, PND, chronic leg swellings No Abdominal pain, No Nausea or vomiting, bowel movements are regular, No Blood in stool or Urine, No dysuria, No new skin rashes or bruises, No new joints pains-aches,  No new weakness, tingling, numbness in any extremity, Recent weight gain, No polyuria, polydypsia or polyphagia, No significant Mental  Stressors.     With Past History of the following :    Past Medical History:  Diagnosis Date  . Arthritis   . CAD (coronary artery disease)    S/P cabg  . Carotid artery occlusion   . Cataract   . CHF (congestive heart failure) ([place])   . DDD (degenerative disc disease), lumbar   . Diabetes mellitus   . Hyperlipidemia   . Hypertension   . Joint pain   . Leg pain   . Myocardial infarction ([place]) 1998  . Peripheral vascular disease ([place])   . PVD (peripheral vascular disease) ([place])   . Stroke [facility])       Past Surgical History:  Procedure Laterality Date  . APPENDECTOMY    . BREAST EXCISIONAL BIOPSY Left 2008  . CHOLECYSTECTOMY     Gall bladder  . COLONOSCOPY  2008  . CORONARY ARTERY BYPASS GRAFT     1998  . LEFT HEART CATH AND CORS/GRAFTS ANGIOGRAPHY N/A 12/22/2017   Procedure: LEFT HEART CATH AND CORS/GRAFTS ANGIOGRAPHY;  Surgeon: Burnell Blanks, MD;  Location: [facility] CV LAB;  Service: Cardiovascular;  Laterality: N/A;   . PR VEIN BYPASS GRAFT,AORTO-FEM-POP    . TEE WITHOUT CARDIOVERSION N/A 01/19/2018   Procedure: TRANSESOPHAGEAL ECHOCARDIOGRAM (TEE);  Surgeon: Josue Hector, MD;  Location: [facility] ENDOSCOPY;  Service: Cardiovascular;  Laterality: N/A;  . TUBAL LIGATION        Social History:     Social History   Tobacco Use  . Smoking status: Former Smoker    Packs/day: 0.25    Types: Cigarettes    Last attempt to quit: 09/13/2011    Years since quitting: 6.9  . Smokeless tobacco: Never Used  Substance Use Topics  . Alcohol use: No     Lives -home with husband  Mobility -independent     Family History :     Family History  Problem Relation Age of Onset  . Heart disease Mother   . Diabetes Mother   . Hyperlipidemia Mother   . Hypertension Mother   . Stroke Mother   . Heart disease Father        Heart Disease before age 45  . Diabetes Father   . Hyperlipidemia Father   . Hypertension Father   . Heart disease Sister        heart attack  . Diabetes Sister        Amputation  . Hypertension Sister   . Other Sister        history of amputation  . Heart attack Sister   . Diabetes Brother   . Hypertension Brother   . Heart disease Brother 68       Before age 61  . Hyperlipidemia Brother   . Diabetes Brother        scirrosis of liver  . Coronary artery disease Other   . Colon cancer Neg Hx   . Stomach cancer Neg Hx   . Esophageal cancer Neg Hx       Home Medications:   Prior to Admission medications   Medication Sig Start Date End Date Taking? Authorizing Provider  amLODipine (NORVASC) 5 MG tablet TAKE 1 TABLET BY MOUTH  DAILY Patient taking differently: Take 5 mg by mouth daily.  11/29/17  Yes Susy Frizzle, MD  CINNAMON PO Take 1,000 mg by mouth 2 (two) times daily.     Yes [provider]  clopidogrel (PLAVIX) 75 MG tablet TAKE 1 TABLET BY MOUTH  EVERY DAY Patient taking differently: Take 75 mg by mouth daily.  02/19/18  Yes Susy Frizzle, MD  Coenzyme  Q10 (CO Q 10 PO) Take 100 mg by mouth daily.     Yes [provider]  fluticasone (FLONASE) 50 MCG/ACT nasal spray Place 2 sprays into both nostrils daily as needed for allergies. 01/14/18  Yes [provider]  furosemide (LASIX) 20 MG tablet Take 20 mg by mouth daily. 06/07/18  Yes [provider]  Insulin Isophane & Regular Human (HUMULIN 70/30 KWIKPEN) (70-30) 100 UNIT/ML PEN 15 units with breakfast and 7 units with supper Patient taking differently: Inject 7-15 Units into the skin See admin instructions. Take 15 units in the morning and 7 units at night 05/10/18  Yes Dixon, Stanton Kidney B, PA-C  metFORMIN (GLUCOPHAGE) 1000 MG tablet TAKE 1 TABLET BY MOUTH  TWICE A DAY WITH MEALS Patient taking differently: Take 1,000 mg by mouth 2 (two) times daily with a meal.  02/13/18  Yes Susy Frizzle, MD  metoprolol succinate (TOPROL-XL) 100 MG 24 hr tablet Take 100 mg by mouth daily. 06/07/18  Yes [provider]  nitroGLYCERIN (NITROSTAT) 0.4 MG SL tablet Place 1 tablet (0.4 mg total) under the tongue every 5 (five) minutes as needed for chest pain. 11/12/14  Yes Susy Frizzle, MD  pioglitazone (ACTOS) 30 MG tablet TAKE 1 TABLET BY MOUTH  DAILY Patient taking differently: Take 30 mg by mouth at bedtime.  02/13/18  Yes Susy Frizzle, MD  Pitavastatin Calcium (LIVALO) 4 MG TABS Take 1 tablet (4 mg total) by mouth daily. Patient taking differently: Take 4 mg by mouth at bedtime.  05/17/18  Yes Susy Frizzle, MD  potassium chloride (K-DUR) 10 MEQ tablet Take 1 tablet (10 mEq total) by mouth daily. 06/02/16 08/11/18 Yes Nahser, Wonda Cheng, MD  quinapril (ACCUPRIL) 40 MG tablet TAKE 1 TABLET BY MOUTH TWO  TIMES DAILY Patient taking differently: Take 40 mg by mouth 2 (two) times daily.  02/13/18  Yes Susy Frizzle, MD  traMADol (ULTRAM) 50 MG tablet Take 1 tablet (50 mg total) by mouth every 8 (eight) hours as needed. Patient taking differently: Take 50 mg by mouth every 8  (eight) hours as needed for moderate pain.  01/09/18  Yes Susy Frizzle, MD     Allergies:     Allergies  Allergen Reactions  . Levaquin [Levofloxacin In D5w] Other (See Comments)    Pain all over and in joints  . Statins Other (See Comments)    Severe myalgias to lipitor, crestor, pravastatin and weakness and cramping  in bilateral leg.  [name] [Linagliptin] Itching     Physical Exam:   Vitals  Blood pressure (!) 169/49, pulse (!) 48, temperature 98.1 F (36.7 C), temperature source Rectal, resp. rate (!) 21, SpO2 95 %.   General: Elderly female sitting upright in bed in no acute distress HEENT: Pupils reactive bilateral, EOMI, JVD +, no pallor, no icterus, moist mucosa, supple neck Chest: Diminished bibasilar breath sounds, no added sounds, rhonchi or wheeze CVs: S1 and S2 bradycardic, no murmurs rub or gallop GI: Soft, nondistended, nontender, bowel sounds present Musculoskeletal: Warm, trace pitting edema bilaterally CNS: Alert and oriented, nonfocal   Data Review:    CBC Recent Labs  Lab 08/11/18 1110  WBC 7.5  HGB 10.6*  HCT 32.9*  PLT 201  MCV 83.5  MCH 26.9  MCHC 32.2  RDW 16.2*   ------------------------------------------------------------------------------------------------------------------  Chemistries  Recent  Labs  Lab 08/11/18 1110  NA 142  K 3.7  CL 104  CO2 29  GLUCOSE 232*  BUN 17  CREATININE 1.07*  CALCIUM 8.8*  AST 30  ALT 31  ALKPHOS 100  BILITOT 0.5   ------------------------------------------------------------------------------------------------------------------ estimated creatinine clearance is 51.4 mL/min (A) (by C-G formula based on SCr of 1.07 mg/dL (H)). ------------------------------------------------------------------------------------------------------------------ No results for input(s): TSH, T4TOTAL, T3FREE, THYROIDAB in the last 72 hours.  Invalid input(s): FREET3  Coagulation profile No results for  input(s): INR, PROTIME in the last 168 hours. ------------------------------------------------------------------------------------------------------------------- No results for input(s): DDIMER in the last 72 hours. -------------------------------------------------------------------------------------------------------------------  Cardiac Enzymes No results for input(s): CKMB, TROPONINI, MYOGLOBIN in the last 168 hours.  Invalid input(s): CK ------------------------------------------------------------------------------------------------------------------    Component Value Date/Time   BNP 487.4 (H) 08/11/2018 1110     ---------------------------------------------------------------------------------------------------------------  Urinalysis    Component Value Date/Time   COLORURINE YELLOW 06/29/2018 1147   APPEARANCEUR CLEAR 06/29/2018 1147   LABSPEC 1.010 06/29/2018 1147   PHURINE 5.0 06/29/2018 1147   GLUCOSEU NEGATIVE 06/29/2018 1147   HGBUR TRACE (A) 06/29/2018 1147   BILIRUBINUR NEGATIVE 03/20/2017 1211   KETONESUR NEGATIVE 06/29/2018 1147   PROTEINUR 2+ (A) 06/29/2018 1147   UROBILINOGEN 0.2 07/12/2013 1055   NITRITE NEGATIVE 06/29/2018 1147   LEUKOCYTESUR NEGATIVE 06/29/2018 1147    ----------------------------------------------------------------------------------------------------------------   Imaging Results:    Dg Chest Port 1 View  Result Date: 08/11/2018 CLINICAL DATA:  Shortness of breath and coughing for 1 day. EXAM: PORTABLE CHEST 1 VIEW COMPARISON:  08/08/2018 and 12/21/2017. FINDINGS: 1324 hours. The heart size and mediastinal contours are stable status post median sternotomy and CABG. There is mild chronic vascular congestion without overt pulmonary edema, confluent airspace opacity, pleural effusion or pneumothorax. No acute osseous findings are seen status post lower cervical fusion. Telemetry leads overlie the chest. IMPRESSION: Stable postoperative  chest. Vascular congestion without overt pulmonary edema. Electronically Signed   By: Richardean Sale M.D.   On: 08/11/2018 14:21    My personal review of EKG: Sinus bradycardia at 45, no ST-T changes.   Assessment & Plan:    Principal Problem:   Acute on chronic diastolic CHF (congestive heart failure) (HCC) No exact triggering symptoms except for recent viral URI. Admit to telemetry.  Placed on IV Lasix 60 mg every 12 hours.  Strict I's/O and daily weight.  Continue daily potassium supplement. Cardiology consult if no clinical improvement.  Active Problems:   Sinus bradycardia Asymptomatic.  Heart rate in mid to high 40s on the monitor.  Hold beta-blocker.  (She is on Toprol 100 mg daily).  Continue telemetry monitoring.  Check TSH.  Patient cardiologist is Dr. Acie Fredrickson who is on-call this weekend.  She does not need official cardiology evaluation at this time.  Please discuss with him on beta-blocker dosing (reduced dose versus discontinuation) prior to discharge and based on hospital course.      Benign essential HTN Blood pressure elevated.  Holding beta-blocker due to bradycardia.  Resume amlodipine and quinapril.  I will place her on PRN IV hydralazine    Diabetes mellitus, type II, insulin dependent, uncontrolled with hyperglycemia (HCC) Last A1c of 7.8 in September.  Continue home dose Lantus.  Holding metformin and Actos.  Monitor on sliding scale coverage    PVD (peripheral vascular disease) (HCC)/history of ischemic stroke   Coronary artery disease involving native coronary artery of native heart without angina pectoris History of CABG.  Continue aspirin (Plavix discontinued after dual antiplatelet coverage  for 3 months in August).  On Livalo for hyperlipidemia as patient is intolerant to statin.    Mild drop in H&H About 1 g from baseline.  (12) monitor for now.  DVT Prophylaxis: Subcu Lovenox  AM Labs Ordered, also please review Full Orders  Family Communication:  Admission, patients condition and plan of care including tests being ordered have been discussed with the patient and her husband at bedside  Code Status full code  Likely DC to home possibly in the next 48 hours if improved  Condition: [place] called: None  Admission status: Inpatient  Patient presenting with acute on chronic diastolic CHF possibly due to failed increase oral diuretic dose as outpatient and symptomatic bradycardia.  Patient needs to be monitored for improvement in her symptoms with IV diuresis and close monitoring of her bradycardia.  She would need to be monitored for at least >2 midnight.  Time spent in minutes : 60   Susanna Benge M.D on 08/11/2018 at 3:47 PM  Between 7am to 7pm - Pager - [phone]. After 7pm go to [email or web address] - password [email or web address]  [facility] - Office  [phone]

## 2018-08-12 DIAGNOSIS — I5033 Acute on chronic diastolic (congestive) heart failure: Secondary | ICD-10-CM

## 2018-08-12 LAB — BASIC METABOLIC PANEL
Anion gap: 9 (ref 5–15)
BUN: 22 mg/dL (ref 8–23)
CO2: 30 mmol/L (ref 22–32)
Calcium: 9 mg/dL (ref 8.9–10.3)
Chloride: 105 mmol/L (ref 98–111)
Creatinine, Ser: 0.97 mg/dL (ref 0.44–1.00)
GFR calc Af Amer: >60 mL/min (ref >60)
GFR calc non Af Amer: 59 mL/min — ABNORMAL LOW (ref >60)
Glucose, Bld: 88 mg/dL (ref 70–99)
Potassium: 3.1 mmol/L — ABNORMAL LOW (ref 3.5–5.1)
Sodium: 144 mmol/L (ref 135–145)

## 2018-08-12 LAB — GLUCOSE, CAPILLARY
Glucose-Capillary: 216 mg/dL — ABNORMAL HIGH (ref 70–99)
Glucose-Capillary: 141 mg/dL — ABNORMAL HIGH (ref 70–99)

## 2018-08-12 LAB — HIV ANTIBODY (ROUTINE TESTING W REFLEX): HIV Screen 4th Generation wRfx: NONREACTIVE

## 2018-08-12 MED ORDER — FUROSEMIDE 40 MG PO TABS: 40.0000 mg | ORAL_TABLET | Freq: Two times a day (BID) | ORAL | Status: DC

## 2018-08-12 MED ORDER — POTASSIUM CHLORIDE ER 10 MEQ PO TBCR: 20.0000 meq | EXTENDED_RELEASE_TABLET | Freq: Every day | ORAL | 0 refills | Status: DC

## 2018-08-12 MED ORDER — GUAIFENESIN-DM 100-10 MG/5ML PO SYRP
5.0000 mL | ORAL_SOLUTION | ORAL | Status: DC | PRN
  Administered 2018-08-12: 5 mL via ORAL
  Filled 2018-08-12: qty 10

## 2018-08-12 MED ORDER — FUROSEMIDE 20 MG PO TABS: 40.0000 mg | ORAL_TABLET | Freq: Two times a day (BID) | ORAL | 0 refills | Status: DC

## 2018-08-12 MED ORDER — POTASSIUM CHLORIDE CRYS ER 20 MEQ PO TBCR: 40.0000 meq | EXTENDED_RELEASE_TABLET | Freq: Once | ORAL | Status: DC

## 2018-08-12 NOTE — Discharge Summary (Signed)
Brenda Moreno, is a 72 y.o. female  DOB 05-02-1946  MRN [id number].  Admission date:  08/11/2018  Admitting Physician  No admitting provider for patient encounter.  Discharge Date:  08/12/2018   Primary MD  Susy Frizzle, MD  Recommendations for primary care physician for things to follow:  CBC. BMP Evaluate for need for lower dose of B-Blocker if bradycardia resolved. Evaluate Fluid Status/orthostasis, and consider diuretic and Potassium dose adjustment as needed   Admission Diagnosis  Bradycardia [R00.1] Dyspnea, unspecified type [R06.00] Acute on chronic congestive heart failure, unspecified heart failure type ([place]) [I50.9]   Discharge Diagnosis  Bradycardia [R00.1] Dyspnea, unspecified type [R06.00] Acute on chronic congestive heart failure, unspecified heart failure type ([place]) [I50.9]    Principal Problem:   Acute on chronic diastolic CHF (congestive heart failure) ([place]) Active Problems:   Benign essential HTN   Diabetes mellitus, type II, insulin dependent (HCC)   PVD (peripheral vascular disease) ([place])   Coronary artery disease involving native coronary artery of native heart without angina pectoris   Acute CHF ([place])   Hx of completed stroke   Anemia   Sinus bradycardia   Acute on chronic diastolic (congestive) heart failure ([place])      Past Medical History:  Diagnosis Date  . Arthritis   . CAD (coronary artery disease)    S/P cabg  . Carotid artery occlusion   . Cataract   . CHF (congestive heart failure) ([place])   . DDD (degenerative disc disease), lumbar   . Diabetes mellitus   . Hyperlipidemia   . Hypertension   . Joint pain   . Leg pain   . Myocardial infarction ([place]) 1998  . Peripheral vascular disease ([place])   . PVD (peripheral vascular disease) ([place])   . Stroke [facility])     Past Surgical History:  Procedure Laterality Date  . APPENDECTOMY    . BREAST EXCISIONAL  BIOPSY Left 2008  . CHOLECYSTECTOMY     Gall bladder  . COLONOSCOPY  2008  . CORONARY ARTERY BYPASS GRAFT     1998  . LEFT HEART CATH AND CORS/GRAFTS ANGIOGRAPHY N/A 12/22/2017   Procedure: LEFT HEART CATH AND CORS/GRAFTS ANGIOGRAPHY;  Surgeon: Burnell Blanks, MD;  Location: [employer] [facility] CV LAB;  Service: Cardiovascular;  Laterality: N/A;  . PR VEIN BYPASS GRAFT,AORTO-FEM-POP    . TEE WITHOUT CARDIOVERSION N/A 01/19/2018   Procedure: TRANSESOPHAGEAL ECHOCARDIOGRAM (TEE);  Surgeon: Josue Hector, MD;  Location: [facility] ENDOSCOPY;  Service: Cardiovascular;  Laterality: N/A;  . TUBAL LIGATION         HPI  from the history and physical done on the day of admission:   Brenda Moreno  is a 72 y.o. 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  She reports  she was taking care of her brother who is a cancer patient and possibly acquired some URI symptoms with productive white phlegm.  She denies any fevers or chills, headache, dizziness or palpitations.  For past 4-5 days she started developing chest heaviness and increasing dyspnea on exertion which progressed to having orthopnea and unable to sleep flat at night.  She went to her PCP office 3 days back where she was instructed to take increased dose of Lasix (40 mg daily instead of 20 mg she is on).  She called her cardiology office and got scheduled to be seen next week only.  However with concern that she has to get up in the night and urinate often on high dose of Lasix she did not take increased dose and continue to take 20 mg daily. Patient denies any fevers, chills, abdominal pain, dysuria, diarrhea, tingling or numbness of extremities, fall or syncope.  No change in  appetite but patient feels she may have gained a few pounds.  Course in the ED Patient was afebrile, respiratory rate in the 20s, blood pressure elevated to 194/103 mmHg, O2 sat stable on room air.  However she was bradycardic to 42 but asymptomatic. Blood work showed WBC of 7.5, hemoglobin of 10.6, platelet of 201, normal electrolytes and renal function, glucose of 232 and BNP close to 500 chest x-ray showed pulmonary vascular congestion.  EKG showed sinus bradycardia at 45, no ST-T changes.  Patient given 60 mg IV Lasix and hospitalist consulted for admission to telemetry for acute on chronic diastolic CHF and sinus bradycardia    Review of systems:    In addition to the HPI above, (positive symptoms in bold) No Fever-chills, No Headache, No changes with Vision or hearing, No problems swallowing food or Liquids, Chest tightness, productive cough, shortness of breath, orthopnea, PND, chronic leg swellings No Abdominal pain, No Nausea or vomiting, bowel movements are regular, No Blood in stool or Urine, No dysuria, No new skin rashes or bruises, No new joints pains-aches,  No new weakness, tingling, numbness in any extremity, Recent weight gain, No polyuria, polydypsia or polyphagia, No significant Mental Stressors.          Hospital Course:  Acute on chronic diastolic CHF (congestive heart failure) (HCC) No exact triggering symptoms except for recent viral URI. Admitted to telemetry.  Placed on IV Lasix 60 mg every 12 hours.  Strict I's/O and daily weight.  Continued daily potassium supplement. By next day off oxygen with sats> 95% on RA and ambulatory without any compliants  Active Problems:   Sinus bradycardia Asymptomatic.  Heart rate in mid to high 40s on the monitor.  Held beta-blocker.  (She is on Toprol 100 mg daily). Still bradycardia by next day - out-patient follow up to consider reduced dose of B-blocker      Benign essential HTN Blood pressure  elevated.  Holding beta-blocker due to bradycardia.  Resume amlodipine and quinapril.  I will place her on PRN IV hydralazine    Diabetes mellitus, type II, insulin dependent, uncontrolled with hyperglycemia (HCC) Last A1c of 7.8 in September.  Continue home dose Lantus.  Holding metformin and Actos.  Monitor on sliding scale coverage    PVD (peripheral vascular disease) (HCC)/history of ischemic stroke   Coronary artery disease involving native coronary artery of native heart without angina pectoris History of CABG.  Continue aspirin (Plavix discontinued after dual antiplatelet coverage for 3 months in August).  On Livalo for hyperlipidemia as patient is intolerant  to statin.    Mild drop in H&H About 1 g from baseline.  (12) monitor for now.       Discharge Condition: improved and satisfactory  Follow UP     Consults obtained - n/a  Diet and Activity recommendation:  As advised  Discharge Instructions     Discharge Instructions    (HEART FAILURE PATIENTS) Call MD:  Anytime you have any of the following symptoms: 1) 3 pound weight gain in 24 hours or 5 pounds in 1 week 2) shortness of breath, with or without a dry hacking cough 3) swelling in the hands, feet or stomach 4) if you have to sleep on extra pillows at night in order to breathe.   Complete by:  As directed    Call MD for:  difficulty breathing, headache or visual disturbances   Complete by:  As directed    Call MD for:  extreme fatigue   Complete by:  As directed    Call MD for:  hives   Complete by:  As directed    Call MD for:  persistant dizziness or light-headedness   Complete by:  As directed    Call MD for:  persistant nausea and vomiting   Complete by:  As directed    Call MD for:  redness, tenderness, or signs of infection (pain, swelling, redness, odor or green/yellow discharge around incision site)   Complete by:  As directed    Call MD for:  severe uncontrolled pain   Complete by:  As directed      Call MD for:  temperature >100.4   Complete by:  As directed    Diet - low sodium heart healthy   Complete by:  As directed    Increase activity slowly   Complete by:  As directed         Discharge Medications     Allergies as of 08/12/2018      Reactions   Levaquin [levofloxacin In D5w] Other (See Comments)   Pain all over and in joints   Statins Other (See Comments)   Severe myalgias to lipitor, crestor, pravastatin and weakness and cramping  in bilateral leg.   Tradjenta [linagliptin] Itching      Medication List    STOP taking these medications   metoprolol succinate 100 MG 24 hr tablet Commonly known as:  TOPROL-XL     TAKE these medications   amLODipine 5 MG tablet Commonly known as:  NORVASC TAKE 1 TABLET BY MOUTH  DAILY What changed:  how much to take   CINNAMON PO Take 1,000 mg by mouth 2 (two) times daily.   clopidogrel 75 MG tablet Commonly known as:  PLAVIX TAKE 1 TABLET BY MOUTH EVERY DAY   CO Q 10 PO Take 100 mg by mouth daily.   fluticasone 50 MCG/ACT nasal spray Commonly known as:  FLONASE Place 2 sprays into both nostrils daily as needed for allergies.   furosemide 20 MG tablet Commonly known as:  LASIX Take 2 tablets (40 mg total) by mouth 2 (two) times daily. What changed:    how much to take  when to take this   Insulin Isophane & Regular Human (70-30) 100 UNIT/ML PEN Commonly known as:  HUMULIN 70/30 KWIKPEN 15 units with breakfast and 7 units with supper What changed:    how much to take  how to take this  when to take this  additional instructions   metFORMIN 1000 MG tablet Commonly known as:  GLUCOPHAGE TAKE 1 TABLET BY MOUTH  TWICE A DAY WITH MEALS   nitroGLYCERIN 0.4 MG SL tablet Commonly known as:  NITROSTAT Place 1 tablet (0.4 mg total) under the tongue every 5 (five) minutes as needed for chest pain.   pioglitazone 30 MG tablet Commonly known as:  ACTOS TAKE 1 TABLET BY MOUTH  DAILY What changed:  when  to take this   Pitavastatin Calcium 4 MG Tabs Commonly known as:  LIVALO Take 1 tablet (4 mg total) by mouth daily. What changed:  when to take this   potassium chloride 10 MEQ tablet Commonly known as:  K-DUR Take 2 tablets (20 mEq total) by mouth daily. What changed:  how much to take   quinapril 40 MG tablet Commonly known as:  ACCUPRIL TAKE 1 TABLET BY MOUTH TWO  TIMES DAILY   traMADol 50 MG tablet Commonly known as:  ULTRAM Take 1 tablet (50 mg total) by mouth every 8 (eight) hours as needed. What changed:  reasons to take this       Major procedures and Radiology Reports - PLEASE review detailed and final reports for all details, in brief -     Dg Chest 2 View  Result Date: 08/08/2018 CLINICAL DATA:  Shortness of breath and cough EXAM: CHEST - 2 VIEW COMPARISON:  12/21/2017 FINDINGS: Borderline heart size. Status post CABG. Negative aortic and hilar contours. Mild scarring or atelectasis along the minor fissure. There is no edema, consolidation, effusion, or pneumothorax. IMPRESSION: No evidence of active disease. Electronically Signed   By: Monte Fantasia M.D.   On: 08/08/2018 13:29   Dg Chest Port 1 View  Result Date: 08/11/2018 CLINICAL DATA:  Shortness of breath and coughing for 1 day. EXAM: PORTABLE CHEST 1 VIEW COMPARISON:  08/08/2018 and 12/21/2017. FINDINGS: 1324 hours. The heart size and mediastinal contours are stable status post median sternotomy and CABG. There is mild chronic vascular congestion without overt pulmonary edema, confluent airspace opacity, pleural effusion or pneumothorax. No acute osseous findings are seen status post lower cervical fusion. Telemetry leads overlie the chest. IMPRESSION: Stable postoperative chest. Vascular congestion without overt pulmonary edema. Electronically Signed   By: Richardean Sale M.D.   On: 08/11/2018 14:21    Micro Results   No results found for this or any previous visit (from the past 240  hour(s)).     Today   Subjective    Brenda Moreno today has no chest pain, no shortness of breath, no fever or chills, no dizziness.  No extremity swelling is about resolved         Patient has been seen and examined prior to discharge   Objective   Blood pressure (!) 154/51, pulse (!) 50, temperature 98 F (36.7 C), temperature source Oral, resp. rate 18, height 5\' 6"  (1.676 m), weight 81.4 kg, SpO2 95 %.   Intake/Output Summary (Last 24 hours) at 08/12/2018 1144 Last data filed at 08/12/2018 0900 Gross per 24 hour  Intake 480 ml  Output 2500 ml  Net -2020 ml    Exam Gen:- Awake, comfortable, no distress HEENT:- [place].AT, mucous membranes moist Neck-Supple Neck,No JVD,  Lungs- mostly clear  CV- S1, S2 normal, mildly bradycardic Abd-  +ve B.Sounds, Abd Soft, No tenderness,    Extremity/Skin:- Intact peripheral pulses    Data Review   CBC w Diff:  Lab Results  Component Value Date   WBC 7.5 08/11/2018   HGB 10.6 (L) 08/11/2018   HCT 32.9 (L) 08/11/2018  PLT 201 08/11/2018   LYMPHOPCT 37 01/17/2018   MONOPCT 11.8 05/17/2018   EOSPCT 2.1 05/17/2018   BASOPCT 0.9 05/17/2018    CMP:  Lab Results  Component Value Date   NA 144 08/12/2018   K 3.1 (L) 08/12/2018   CL 105 08/12/2018   CO2 30 08/12/2018   BUN 22 08/12/2018   CREATININE 0.97 08/12/2018   CREATININE 1.23 (H) 06/29/2018   PROT 6.2 (L) 08/11/2018   PROT 6.6 03/05/2018   ALBUMIN 3.1 (L) 08/11/2018   ALBUMIN 4.0 03/05/2018   BILITOT 0.5 08/11/2018   BILITOT 0.5 03/05/2018   ALKPHOS 100 08/11/2018   AST 30 08/11/2018   ALT 31 08/11/2018  .   Total Discharge time is about 33 minutes  Benito Mccreedy M.D on 08/12/2018 at 11:44 AM  [facility]   Office  [phone]  Dragon dictation system was used to create this note, attempts have been made to correct errors, however presence of uncorrected errors is not a reflection quality of care provided

## 2018-08-14 ENCOUNTER — Ambulatory Visit (INDEPENDENT_AMBULATORY_CARE_PROVIDER_SITE_OTHER): Payer: Medicare Other | Admitting: Family Medicine

## 2018-08-14 ENCOUNTER — Encounter: Payer: Self-pay | Admitting: Family Medicine

## 2018-08-14 VITALS — BP 150/78 | HR 58 | Temp 98.2°F | Resp 16 | Ht 64.0 in | Wt 182.0 lb

## 2018-08-14 DIAGNOSIS — E1169 Type 2 diabetes mellitus with other specified complication: Secondary | ICD-10-CM | POA: Diagnosis not present

## 2018-08-14 DIAGNOSIS — Z794 Long term (current) use of insulin: Secondary | ICD-10-CM | POA: Diagnosis not present

## 2018-08-14 DIAGNOSIS — I5021 Acute systolic (congestive) heart failure: Secondary | ICD-10-CM

## 2018-08-14 MED ORDER — BENZONATATE 100 MG PO CAPS: 200.0000 mg | ORAL_CAPSULE | Freq: Three times a day (TID) | ORAL | 0 refills | Status: DC | PRN

## 2018-08-14 MED ORDER — DOXAZOSIN MESYLATE 2 MG PO TABS: 2.0000 mg | ORAL_TABLET | Freq: Every day | ORAL | 2 refills | Status: DC

## 2018-08-14 NOTE — Progress Notes (Signed)
Subjective:    Patient ID: Brenda Moreno, female    DOB: 06-Apr-1946, 72 y.o.   MRN: [id number]  Patient is here today for hospital discharge follow-up.  She was recently admitted to the hospital with congestive heart failure.  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 She reports she was taking care of her brother who is a cancer patient and possibly acquired some URI symptoms with productive white phlegm. She denies any fevers or chills, headache, dizziness or palpitations. For past 4-5 days she started developing chest heaviness and increasing dyspnea on exertion which progressed to having orthopnea and unable to sleep flat at night. She went to her PCP office 3 days back where she was instructed to take increased dose of Lasix (40 mg daily instead of 20 mg she is on). She called her cardiology office and got scheduled to be seen next week only. However with concern that she has to get up in the night and urinate often on high dose of Lasix she did not take increased dose and continue to take 20 mg daily. Patient denies any fevers, chills, abdominal pain, dysuria, diarrhea, tingling or numbness of extremities, fall or syncope. No change in appetite but patient feels she may have gained a few pounds.  Course in the ED Patient was afebrile, respiratory rate in the 20s, blood pressure elevated to 194/103 mmHg, O2 sat stable on room air. However she was bradycardic to 42 but asymptomatic. Blood work showed WBC of 7.5, hemoglobin of 10.6, platelet of 201, normal electrolytes and renal function,  glucose of 232 and BNP close to 500 chest x-ray showed pulmonary vascular congestion. EKG showed sinus bradycardia at 45, no ST-T changes. Patient given 60 mg IV Lasix and hospitalist consulted for admission to telemetryfor acute on chronic diastolic CHF and sinus bradycardia   Review of systems:   In addition to the HPI above,(positive symptoms in bold) No Fever-chills, No Headache, No changes with Vision or hearing, No problems swallowing food or Liquids, Chest tightness, productive cough, shortness of breath, orthopnea, PND,chronic leg swellings No Abdominal pain, No Nausea orvomiting, bowel movements are regular, No Blood in stool or Urine, No dysuria, No new skin rashes or bruises, No new joints pains-aches,  No new weakness, tingling, numbness in any extremity, Recent weight gain, No polyuria, polydypsia or polyphagia, No significant Mental Stressors.          Hospital Course:  Acute on chronic diastolic CHF (congestive heart failure) (HCC) No exact triggering symptoms except for recent viral URI. Admitted to telemetry. Placed on IV Lasix 60 mg every 12 hours. Strict I's/O and daily weight. Continued daily potassium supplement. By next day off oxygen with sats> 95% on RA and ambulatory without any compliants  Active Problems: Sinus bradycardia Asymptomatic. Heart rate in mid to high 40s on the monitor. Held beta-blocker. (She is on Toprol 100 mg daily). Still bradycardia by next day - out-patient follow up to consider reduced dose of B-blocker    Benign essential HTN Blood pressure elevated. Holding beta-blocker due to bradycardia. Resume amlodipine and quinapril. I will place her on PRN IV hydralazine  Diabetes  mellitus, type II, insulin dependent, uncontrolled with hyperglycemia(HCC) Last A1c of 7.8 in September. Continue home dose Lantus. Holding metformin and Actos. Monitor on sliding scale coverage  PVD (peripheral  vascular disease) (HCC)/history of ischemic stroke Coronary artery disease involving native coronary artery of native heart without angina pectoris History of CABG. Continue aspirin (Plavix discontinued after dual antiplatelet coverage for 3 months in August). On Livalo for hyperlipidemia as patient is intolerant to statin.   Mild drop in H&H About 1 g from baseline. (12) monitor for now.   08/14/18 Patient is here today for follow-up.  She continues to have a cough but she attributes this to an upper respiratory infection.  The cough is nonproductive.  She denies any fevers.  She denies any chest pain.  Her orthopnea and paroxysmal nocturnal dyspnea has improved.  She still has +1 edema in both legs to the mid shin.  However her lungs are clear to auscultation bilaterally.  Taking Lasix 40 mg twice daily.  Recently the patient has been on Actos due to cost to help manage her diabetes.  After her recent hospitalization I question if this is a good option for this patient given the fact she was recently admitted with pulmonary vascular congestion.  Also wonder if she would benefit switching to Jardiance given the cardioprotective benefits particular for heart failure patients.  Far [employer] would also be a good option for her.  We spent approximately 10 minutes discussing this and the patient is very interested as long as she can afford the medication    Past Medical History:  Diagnosis Date  . Arthritis   . CAD (coronary artery disease)    S/P cabg  . Carotid artery occlusion   . Cataract   . CHF (congestive heart failure) ([place])   . DDD (degenerative disc disease), lumbar   . Diabetes mellitus   . Hyperlipidemia   . Hypertension   . Joint pain   . Leg pain   . Myocardial infarction ([place]) 1998  . Peripheral vascular disease ([place])   . PVD (peripheral vascular disease) ([place])   . Stroke [facility])    Past Surgical History:  Procedure Laterality Date  . APPENDECTOMY    . BREAST EXCISIONAL  BIOPSY Left 2008  . CHOLECYSTECTOMY     Gall bladder  . COLONOSCOPY  2008  . CORONARY ARTERY BYPASS GRAFT     1998  . LEFT HEART CATH AND CORS/GRAFTS ANGIOGRAPHY N/A 12/22/2017   Procedure: LEFT HEART CATH AND CORS/GRAFTS ANGIOGRAPHY;  Surgeon: Burnell Blanks, MD;  Location: [place] [facility];  Service: Cardiovascular;  Laterality: N/A;  . PR VEIN BYPASS GRAFT,AORTO-FEM-POP    . TEE WITHOUT CARDIOVERSION N/A 01/19/2018   Procedure: TRANSESOPHAGEAL ECHOCARDIOGRAM (TEE);  Surgeon: Josue Hector, MD;  Location: [facility] ENDOSCOPY;  Service: Cardiovascular;  Laterality: N/A;  . TUBAL LIGATION     Current Outpatient Medications on File Prior to Visit  Medication Sig Dispense Refill  . amLODipine (NORVASC) 5 MG tablet TAKE 1 TABLET BY MOUTH  DAILY (Patient taking differently: Take 5 mg by mouth daily. ) 90 tablet 1  . CINNAMON PO Take 1,000 mg by mouth 2 (two) times daily.      . clopidogrel (PLAVIX) 75 MG tablet TAKE 1 TABLET BY MOUTH EVERY DAY (Patient taking differently: Take 75 mg by mouth daily. ) 90 tablet 3  . Coenzyme Q10 (CO Q 10 PO) Take 100 mg by mouth daily.      . fluticasone (FLONASE) 50  MCG/ACT nasal spray Place 2 sprays into both nostrils daily as needed for allergies.    . furosemide (LASIX) 20 MG tablet Take 2 tablets (40 mg total) by mouth 2 (two) times daily. 60 tablet 0  . Insulin Isophane & Regular Human (HUMULIN 70/30 KWIKPEN) (70-30) 100 UNIT/ML PEN 15 units with breakfast and 7 units with supper (Patient taking differently: Inject 7-15 Units into the skin See admin instructions. Take 15 units in the morning and 7 units at night) 15 mL 11  . metFORMIN (GLUCOPHAGE) 1000 MG tablet TAKE 1 TABLET BY MOUTH  TWICE A DAY WITH MEALS (Patient taking differently: Take 1,000 mg by mouth 2 (two) times daily with a meal. ) 180 tablet 1  . nitroGLYCERIN (NITROSTAT) 0.4 MG SL tablet Place 1 tablet (0.4 mg total) under the tongue every 5 (five) minutes as needed for chest pain. 90 tablet  2  . pioglitazone (ACTOS) 30 MG tablet TAKE 1 TABLET BY MOUTH  DAILY (Patient taking differently: Take 30 mg by mouth at bedtime. ) 90 tablet 1  . Pitavastatin Calcium (LIVALO) 4 MG TABS Take 1 tablet (4 mg total) by mouth daily. (Patient taking differently: Take 4 mg by mouth at bedtime. ) 30 tablet 3  . potassium chloride (K-DUR) 10 MEQ tablet Take 1 tablet (10 mEq total) by mouth daily. 30 tablet 11  . potassium chloride (K-DUR) 10 MEQ tablet Take 2 tablets (20 mEq total) by mouth daily. 30 tablet 0  . quinapril (ACCUPRIL) 40 MG tablet TAKE 1 TABLET BY MOUTH TWO  TIMES DAILY (Patient taking differently: Take 40 mg by mouth 2 (two) times daily. ) 180 tablet 3  . traMADol (ULTRAM) 50 MG tablet Take 1 tablet (50 mg total) by mouth every 8 (eight) hours as needed. (Patient taking differently: Take 50 mg by mouth every 8 (eight) hours as needed for moderate pain. ) 180 tablet 0   No current facility-administered medications on file prior to visit.    Allergies  Allergen Reactions  . Levaquin [Levofloxacin In D5w] Other (See Comments)    Pain all over and in joints  . Statins Other (See Comments)    Severe myalgias to lipitor, crestor, pravastatin and weakness and cramping  in bilateral leg.  [name] [Linagliptin] Itching   Social History   Socioeconomic History  . Marital status: Divorced    Spouse name: Not on file  . Number of children: Not on file  . Years of education: Not on file  . Highest education level: Not on file  Occupational History  . Not on file  Social Needs  . Financial resource strain: Not on file  . Food insecurity:    Worry: Not on file    Inability: Not on file  . Transportation needs:    Medical: Not on file    Non-medical: Not on file  Tobacco Use  . Smoking status: Former Smoker    Packs/day: 0.25    Types: Cigarettes    Last attempt to quit: 09/13/2011    Years since quitting: 6.9  . Smokeless tobacco: Never Used  Substance and Sexual Activity  .  Alcohol use: No  . Drug use: No  . Sexual activity: Not on file  Lifestyle  . Physical activity:    Days per week: Not on file    Minutes per session: Not on file  . Stress: Not on file  Relationships  . Social connections:    Talks on phone: Not on file  Gets together: Not on file    Attends religious service: Not on file    Active member of club or organization: Not on file    Attends meetings of clubs or organizations: Not on file    Relationship status: Not on file  . Intimate partner violence:    Fear of current or ex partner: Not on file    Emotionally abused: Not on file    Physically abused: Not on file    Forced sexual activity: Not on file  Other Topics Concern  . Not on file  Social History Narrative  . Not on file    Review of Systems  All other systems reviewed and are negative.      Objective:   Physical Exam Vitals signs reviewed.  Constitutional:      Appearance: She is well-developed.  Cardiovascular:     Rate and Rhythm: Normal rate and regular rhythm.     Heart sounds: Normal heart sounds. No murmur. No friction rub. No gallop.   Pulmonary:     Effort: Pulmonary effort is normal. No respiratory distress.     Breath sounds: Normal breath sounds. No stridor. No wheezing or rales.  Abdominal:     General: Bowel sounds are normal.     Palpations: Abdomen is soft.  Musculoskeletal:     Right lower leg: Edema present.     Left lower leg: Edema present.           Assessment & Plan:  Type 2 diabetes mellitus with other specified complication, with long-term current use of insulin (HCC) - Plan: Hemoglobin A1c, COMPLETE METABOLIC PANEL WITH GFR, CBC with Differential/Platelet, Lipid panel  Acute systolic congestive heart failure ([place]) - Plan: Brain natriuretic peptide  Patient's blood pressure is elevated.  She is off metoprolol due to bradycardia.  Therefore I will replace the metoprolol with Cardura 2 mg a day to better control her blood  pressure.  I will obtain a CBC as well as a CMP.  Continue Lasix 40 mg twice daily at the present time unless renal function is deteriorating as she still appears to have some residual edema in her legs.  I believe the cough is likely due to a upper respiratory infection I will treat that with Tessalon Perles.  I believe it would be better for this patient to discontinue Actos given her recent admission due to heart failure and replacing that with either Jardiance or [place].  I will obtain lab work to determine if there is any contraindication and if not I will start the patient on Farxiga given its proven benefit with congestive heart failure.  I will also discontinue Actos.  Therefore I will check a hemoglobin A1c to monitor her glycemic control prior to making this change

## 2018-08-15 ENCOUNTER — Other Ambulatory Visit: Payer: Self-pay | Admitting: Family Medicine

## 2018-08-15 LAB — COMPLETE METABOLIC PANEL WITH GFR
AG Ratio: 1.2 (calc) (ref 1.0–2.5)
ALT: 21 U/L (ref 6–29)
AST: 24 U/L (ref 10–35)
Albumin: 3.7 g/dL (ref 3.6–5.1)
Alkaline phosphatase (APISO): 112 U/L (ref 33–130)
BILIRUBIN TOTAL: 0.4 mg/dL (ref 0.2–1.2)
BUN/Creatinine Ratio: 22 (calc) (ref 6–22)
BUN: 25 mg/dL (ref 7–25)
CHLORIDE: 104 mmol/L (ref 98–110)
CO2: 28 mmol/L (ref 20–32)
Calcium: 9.8 mg/dL (ref 8.6–10.4)
Creat: 1.16 mg/dL — ABNORMAL HIGH (ref 0.60–0.93)
GFR, Est African American: 55 mL/min/{1.73_m2} — ABNORMAL LOW (ref >=60)
GFR, Est Non African American: 48 mL/min/{1.73_m2} — ABNORMAL LOW (ref >=60)
GLUCOSE: 96 mg/dL (ref 65–99)
Globulin: 3.1 g/dL (calc) (ref 1.9–3.7)
Potassium: 4.8 mmol/L (ref 3.5–5.3)
Sodium: 145 mmol/L (ref 135–146)
Total Protein: 6.8 g/dL (ref 6.1–8.1)

## 2018-08-15 LAB — CBC WITH DIFFERENTIAL/PLATELET
Absolute Monocytes: 589 cells/uL (ref 200–950)
BASOS PCT: 0.6 %
Basophils Absolute: 50 cells/uL (ref 0–200)
Eosinophils Absolute: 17 cells/uL (ref 15–500)
Eosinophils Relative: 0.2 %
HCT: 39.3 % (ref 35.0–45.0)
Hemoglobin: 12.6 g/dL (ref 11.7–15.5)
Lymphs Abs: 1868 cells/uL (ref 850–3900)
MCH: 26.8 pg — ABNORMAL LOW (ref 27.0–33.0)
MCHC: 32.1 g/dL (ref 32.0–36.0)
MCV: 83.6 fL (ref 80.0–100.0)
MPV: 12.2 fL (ref 7.5–12.5)
Monocytes Relative: 7.1 %
Neutro Abs: 5777 cells/uL (ref 1500–7800)
Neutrophils Relative %: 69.6 %
PLATELETS: 275 10*3/uL (ref 140–400)
RBC: 4.7 10*6/uL (ref 3.80–5.10)
RDW: 14.6 % (ref 11.0–15.0)
Total Lymphocyte: 22.5 %
WBC: 8.3 10*3/uL (ref 3.8–10.8)

## 2018-08-15 LAB — HEMOGLOBIN A1C
EAG (MMOL/L): 9.8 (calc)
Hgb A1c MFr Bld: 7.8 % of total Hgb — ABNORMAL HIGH (ref <5.7)
Mean Plasma Glucose: 177 (calc)

## 2018-08-15 LAB — BRAIN NATRIURETIC PEPTIDE: BRAIN NATRIURETIC PEPTIDE: 276 pg/mL — AB (ref <100)

## 2018-08-15 LAB — LIPID PANEL
Cholesterol: 166 mg/dL (ref <200)
HDL: 50 mg/dL — ABNORMAL LOW (ref >50)
LDL Cholesterol (Calc): 99 mg/dL (calc)
Non-HDL Cholesterol (Calc): 116 mg/dL (calc) (ref <130)
Total CHOL/HDL Ratio: 3.3 (calc) (ref <5.0)
Triglycerides: 83 mg/dL (ref <150)

## 2018-08-20 ENCOUNTER — Other Ambulatory Visit: Payer: Self-pay | Admitting: Family Medicine

## 2018-08-20 MED ORDER — DAPAGLIFLOZIN PROPANEDIOL 10 MG PO TABS: 10.0000 mg | ORAL_TABLET | Freq: Every day | ORAL | 3 refills | Status: DC

## 2018-08-23 ENCOUNTER — Other Ambulatory Visit: Payer: Self-pay | Admitting: *Deleted

## 2018-08-30 ENCOUNTER — Other Ambulatory Visit: Payer: Self-pay | Admitting: *Deleted

## 2018-09-02 IMAGING — CT CT HEAD W/O CM
4 series · 16 of 47 positions shown, 18 images · non-contrast
Comparison: MRI brain 07/06/2010.

CLINICAL DATA: Acute onset of severe headache earlier today. No
injury.

EXAM:
CT HEAD WITHOUT CONTRAST
TECHNIQUE: Contiguous axial images were obtained from the base of the skull
through the vertex without intravenous contrast.

[Series 3: head wo · axial · 0.41mm/px · z∈[+548,+668]mm · 7 of 32 slices shown, 9 images]
[im 4/32  brain]
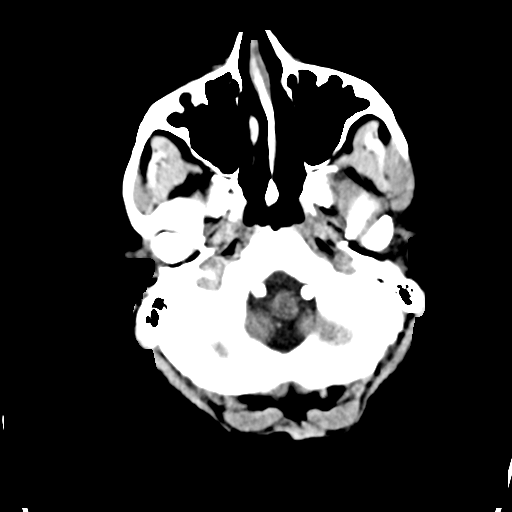
[im 4/32  bone]
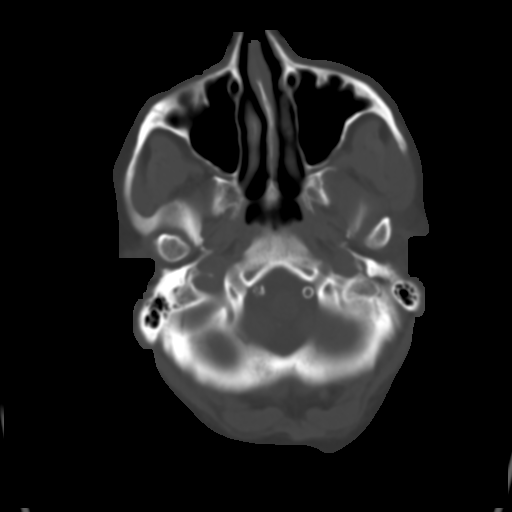
[im 8/32  brain]
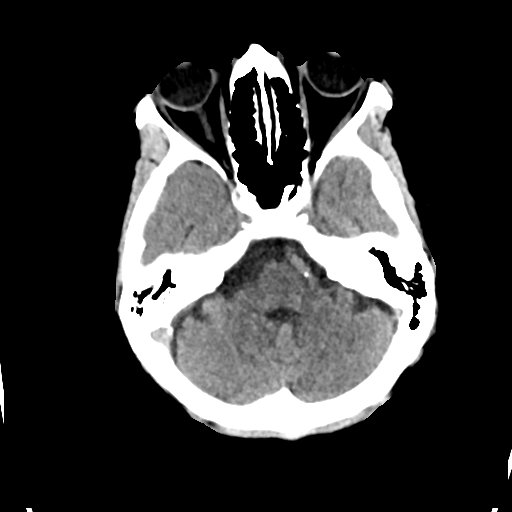
[im 12/32  brain]
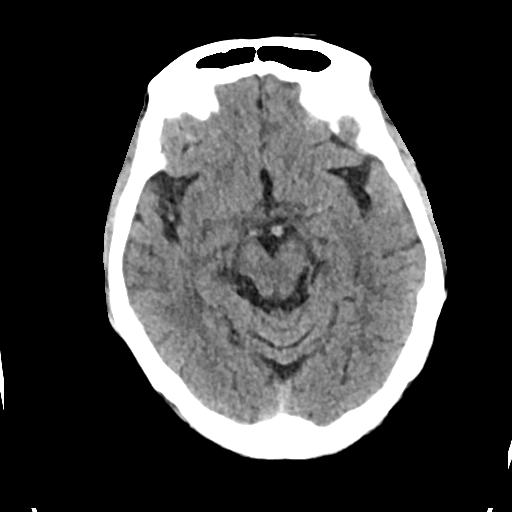
[im 16/32  brain]
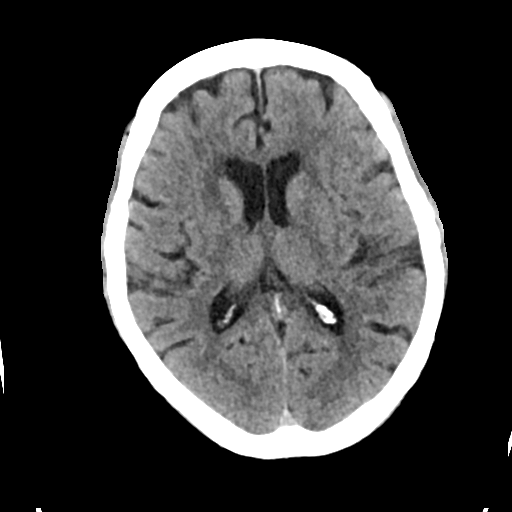
[im 20/32  brain]
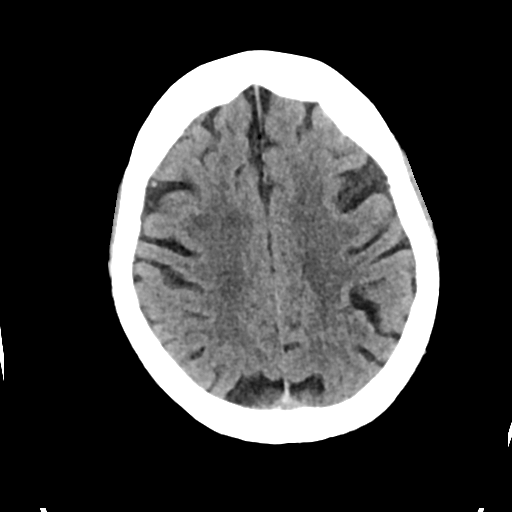
[im 20/32  bone]
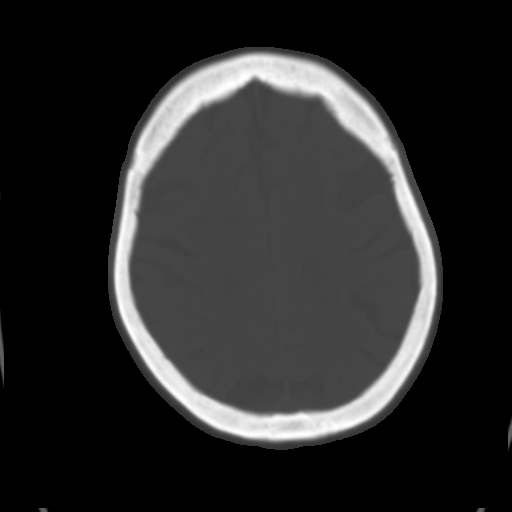
[im 24/32  brain]
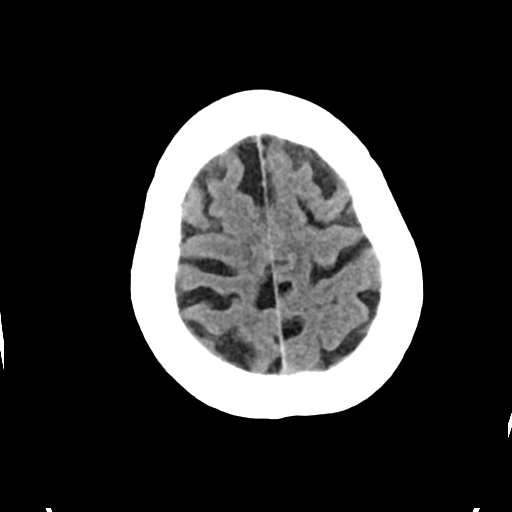
[im 28/32  brain]
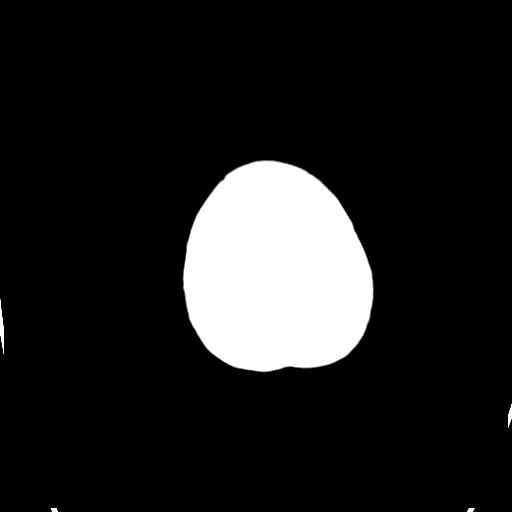

[Series 4: head bone · axial · 0.41mm/px · z∈[+546,+578]mm · 3 of 79 slices shown]
[im 8/79  bone]
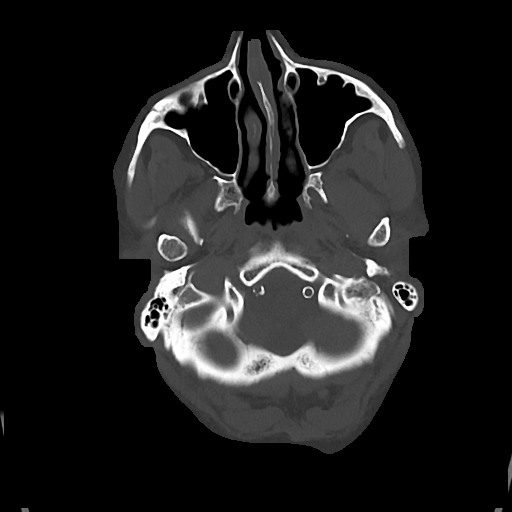
[im 16/79  bone]
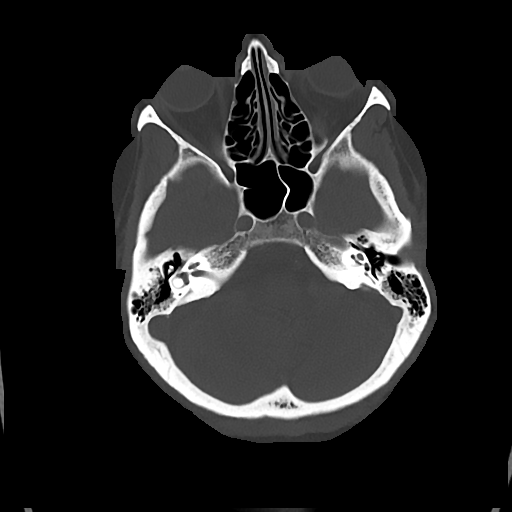
[im 24/79  bone]
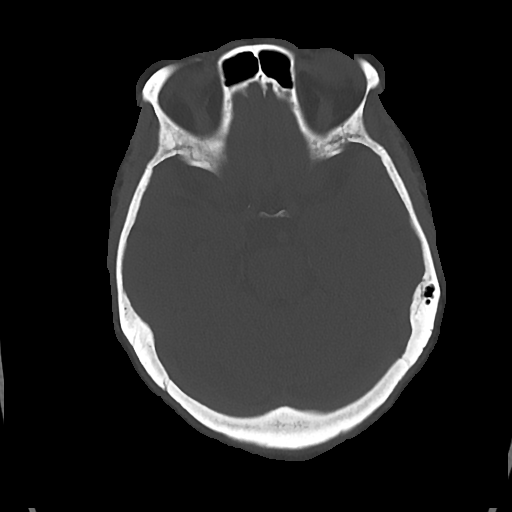

[Series 5: cor soft · coronal · 0.31mm/px · 3 of 67 slices shown]
[im 23/67  brain]
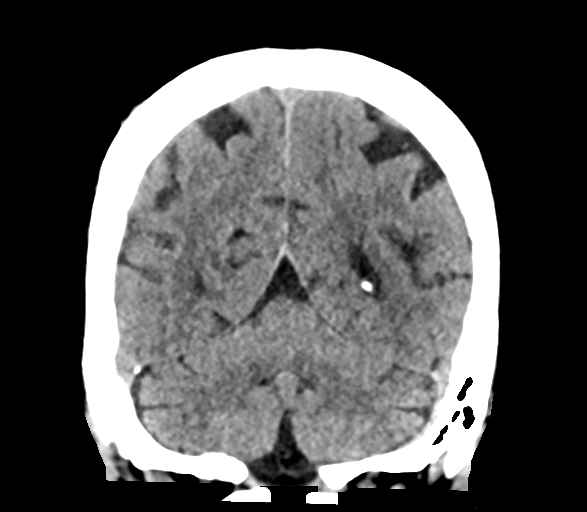
[im 30/67  brain]
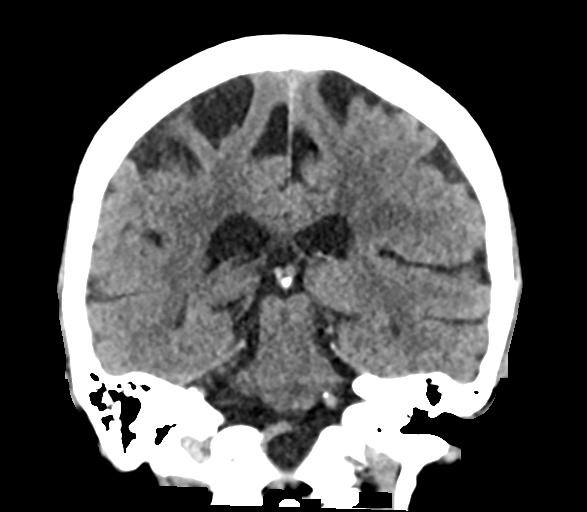
[im 37/67  brain]
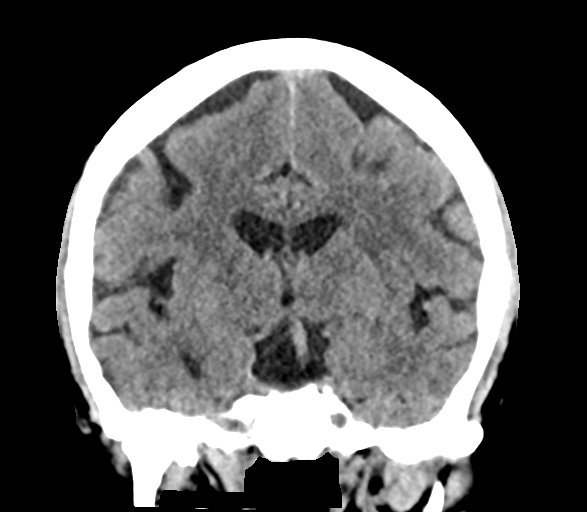

[Series 6: sag soft · sagittal · 0.31mm/px · 3 of 63 slices shown]
[im 21/63  brain]
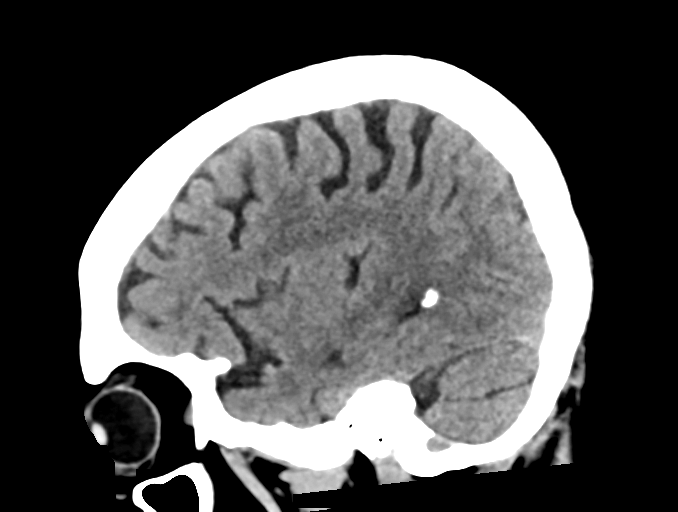
[im 32/63  brain]
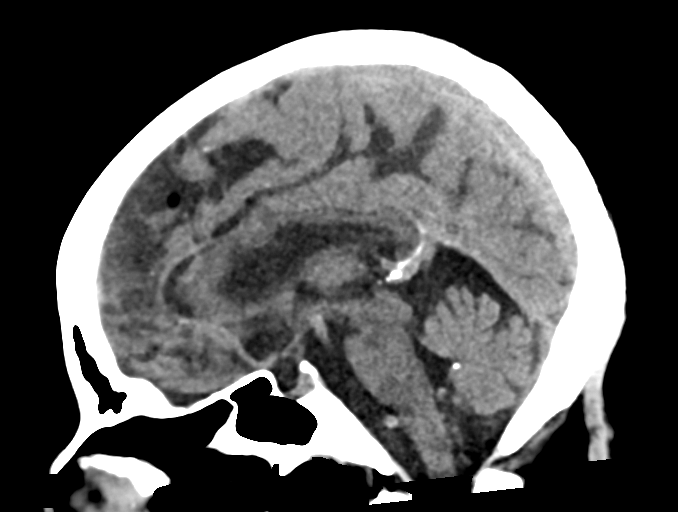
[im 42/63  brain]
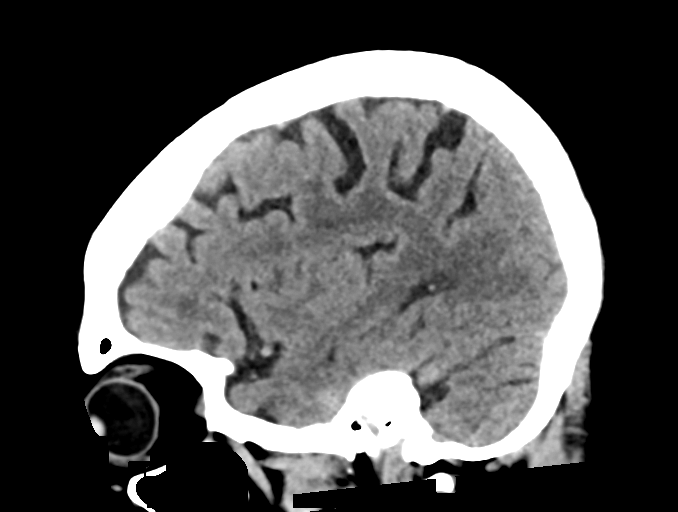

[16 of 47 positions shown; findings below may reference images not displayed]

FINDINGS: Brain: Ventricular system normal in size and appearance for age.
Moderate changes of small vessel disease of the white matter. No
mass lesion. No midline shift. No acute hemorrhage or hematoma. No
extra-axial fluid collections. No evidence of acute infarction.

Vascular: Severe bilateral carotid siphon and bilateral vertebral
artery atherosclerosis.

Skull: No skull fracture or other focal osseous abnormality
involving the skull.

Sinuses/Orbits: Visualized paranasal sinuses, bilateral mastoid air
cells and bilateral middle ear cavities well-aerated. Visualized
orbits and globes are normal.

Other: None.
IMPRESSION: 1. No acute intracranial abnormality.
2. Moderate chronic microvascular ischemic changes of the white
matter.

## 2018-09-07 ENCOUNTER — Ambulatory Visit: Payer: Self-pay | Admitting: Family Medicine

## 2018-09-10 ENCOUNTER — Telehealth: Payer: Self-pay | Admitting: Family Medicine

## 2018-09-10 MED ORDER — EMPAGLIFLOZIN 25 MG PO TABS: 25.0000 mg | ORAL_TABLET | Freq: Every day | ORAL | 5 refills | Status: DC

## 2018-09-10 NOTE — Telephone Encounter (Signed)
New rx sent to pharm

## 2018-09-10 NOTE — Telephone Encounter (Signed)
Switch to Jardiance 25 mg poqday

## 2018-09-10 NOTE — Telephone Encounter (Signed)
[facility] not covered by insurance - Invokana & Jardiance on formulary.

## 2018-09-11 ENCOUNTER — Encounter: Payer: Self-pay | Admitting: Family Medicine

## 2018-09-19 ENCOUNTER — Other Ambulatory Visit (HOSPITAL_COMMUNITY): Payer: Self-pay | Admitting: Family Medicine

## 2018-09-27 ENCOUNTER — Other Ambulatory Visit: Payer: Medicare Other

## 2018-09-27 DIAGNOSIS — N289 Disorder of kidney and ureter, unspecified: Secondary | ICD-10-CM

## 2018-09-27 LAB — COMPLETE METABOLIC PANEL WITH GFR
AG Ratio: 1.4 (calc) (ref 1.0–2.5)
ALT: 11 U/L (ref 6–29)
AST: 13 U/L (ref 10–35)
Albumin: 3.9 g/dL (ref 3.6–5.1)
Alkaline phosphatase (APISO): 85 U/L (ref 33–130)
BUN/Creatinine Ratio: 20 (calc) (ref 6–22)
BUN: 35 mg/dL — AB (ref 7–25)
CO2: 27 mmol/L (ref 20–32)
CREATININE: 1.76 mg/dL — AB (ref 0.60–0.93)
Calcium: 9.8 mg/dL (ref 8.6–10.4)
Chloride: 109 mmol/L (ref 98–110)
GFR, Est African American: 33 mL/min/{1.73_m2} — ABNORMAL LOW (ref >=60)
GFR, Est Non African American: 29 mL/min/{1.73_m2} — ABNORMAL LOW (ref >=60)
Globulin: 2.7 g/dL (calc) (ref 1.9–3.7)
Glucose, Bld: 82 mg/dL (ref 65–99)
Potassium: 4.8 mmol/L (ref 3.5–5.3)
Sodium: 146 mmol/L (ref 135–146)
Total Bilirubin: 0.4 mg/dL (ref 0.2–1.2)
Total Protein: 6.6 g/dL (ref 6.1–8.1)

## 2018-10-02 ENCOUNTER — Encounter: Payer: Self-pay | Admitting: Family Medicine

## 2018-10-02 ENCOUNTER — Ambulatory Visit (INDEPENDENT_AMBULATORY_CARE_PROVIDER_SITE_OTHER): Payer: Medicare Other | Admitting: Family Medicine

## 2018-10-02 ENCOUNTER — Other Ambulatory Visit: Payer: Self-pay | Admitting: Family Medicine

## 2018-10-02 VITALS — BP 105/58 | HR 82 | Temp 97.7°F | Resp 16 | Ht 64.0 in | Wt 169.0 lb

## 2018-10-02 DIAGNOSIS — N289 Disorder of kidney and ureter, unspecified: Secondary | ICD-10-CM

## 2018-10-02 LAB — BASIC METABOLIC PANEL WITH GFR
BUN/Creatinine Ratio: 24 (calc) — ABNORMAL HIGH (ref 6–22)
BUN: 40 mg/dL — ABNORMAL HIGH (ref 7–25)
CO2: 27 mmol/L (ref 20–32)
Calcium: 9.4 mg/dL (ref 8.6–10.4)
Chloride: 107 mmol/L (ref 98–110)
Creat: 1.69 mg/dL — ABNORMAL HIGH (ref 0.60–0.93)
GFR, Est African American: 35 mL/min/{1.73_m2} — ABNORMAL LOW (ref >=60)
GFR, Est Non African American: 30 mL/min/{1.73_m2} — ABNORMAL LOW (ref >=60)
Glucose, Bld: 91 mg/dL (ref 65–99)
Potassium: 3.9 mmol/L (ref 3.5–5.3)
Sodium: 145 mmol/L (ref 135–146)

## 2018-10-02 LAB — URINALYSIS, ROUTINE W REFLEX MICROSCOPIC
Bilirubin Urine: NEGATIVE
Hgb urine dipstick: NEGATIVE
Ketones, ur: NEGATIVE
Leukocytes, UA: NEGATIVE
NITRITE: NEGATIVE
Protein, ur: NEGATIVE
Specific Gravity, Urine: 1.01 (ref 1.001–1.03)
pH: 5.5 (ref 5.0–8.0)

## 2018-10-02 MED ORDER — METFORMIN HCL 1000 MG PO TABS: 1000.0000 mg | ORAL_TABLET | Freq: Two times a day (BID) | ORAL | 1 refills | Status: DC

## 2018-10-02 NOTE — Progress Notes (Signed)
Subjective:    Patient ID: Brenda Moreno, female    DOB: 04-18-1946, 73 y.o.   MRN: [id number]  Patient is here today for hospital discharge follow-up.  She was recently admitted to the hospital with congestive heart failure.  I have copied relevant portions of the discharge summary and included them below for my reference: Brenda Moreno a72 y.o.female,with history of diastolic CHF, CAD with history of CABG, right paramedian pontine infarct in May with no residual weakness, hypertension, uncontrolled type 2 diabetes mellitus with A1c >8, on insulin, chronic diastolic CHF on Lasix, CKD stage III who reported having increasing shortness of breath for past 5-6 days. She reports she was taking care of her brother who is a cancer patient and possibly acquired some URI symptoms with productive white phlegm. She denies any fevers or chills, headache, dizziness or palpitations. For past 4-5 days she started developing chest heaviness and increasing dyspnea on exertion which progressed to having orthopnea and unable to sleep flat at night. She went to her PCP office 3 days back where she was instructed to take increased dose of Lasix (40 mg daily instead of 20 mg she is on). She called her cardiology office and got scheduled to be seen next week only. However with concern that she has to get up in the night and urinate often on high dose of Lasix she did not take increased dose and continue to take 20 mg daily. Patient denies any fevers, chills, abdominal pain, dysuria, diarrhea, tingling or numbness of extremities, fall or syncope. No change in appetite but patient feels she may have gained a few pounds.  Course in the ED Patient was afebrile, respiratory rate in the 20s, blood pressure elevated to 194/103 mmHg, O2 sat stable on room air. However she was bradycardic to 42 but asymptomatic. Blood work showed WBC of 7.5, hemoglobin of 10.6, platelet of 201, normal electrolytes and renal function,  glucose of 232 and BNP close to 500 chest x-ray showed pulmonary vascular congestion. EKG showed sinus bradycardia at 45, no ST-T changes. Patient given 60 mg IV Lasix and hospitalist consulted for admission to telemetryfor acute on chronic diastolic CHF and sinus bradycardia   Review of systems:   In addition to the HPI above,(positive symptoms in bold) No Fever-chills, No Headache, No changes with Vision or hearing, No problems swallowing food or Liquids, Chest tightness, productive cough, shortness of breath, orthopnea, PND,chronic leg swellings No Abdominal pain, No Nausea orvomiting, bowel movements are regular, No Blood in stool or Urine, No dysuria, No new skin rashes or bruises, No new joints pains-aches,  No new weakness, tingling, numbness in any extremity, Recent weight gain, No polyuria, polydypsia or polyphagia, No significant Mental Stressors.          Hospital Course:  Acute on chronic diastolic CHF (congestive heart failure) (HCC) No exact triggering symptoms except for recent viral URI. Admitted to telemetry. Placed on IV Lasix 60 mg every 12 hours. Strict I's/O and daily weight. Continued daily potassium supplement. By next day off oxygen with sats> 95% on RA and ambulatory without any compliants  Active Problems: Sinus bradycardia Asymptomatic. Heart rate in mid to high 40s on the monitor. Held beta-blocker. (She is on Toprol 100 mg daily). Still bradycardia by next day - out-patient follow up to consider reduced dose of B-blocker    Benign essential HTN Blood pressure elevated. Holding beta-blocker due to bradycardia. Resume amlodipine and quinapril. I will place her on PRN IV hydralazine  Diabetes  mellitus, type II, insulin dependent, uncontrolled with hyperglycemia(HCC) Last A1c of 7.8 in September. Continue home dose Lantus. Holding metformin and Actos. Monitor on sliding scale coverage  PVD (peripheral  vascular disease) (HCC)/history of ischemic stroke Coronary artery disease involving native coronary artery of native heart without angina pectoris History of CABG. Continue aspirin (Plavix discontinued after dual antiplatelet coverage for 3 months in August). On Livalo for hyperlipidemia as patient is intolerant to statin.   Mild drop in H&H About 1 g from baseline. (12) monitor for now.   08/14/18 Patient is here today for follow-up.  She continues to have a cough but she attributes this to an upper respiratory infection.  The cough is nonproductive.  She denies any fevers.  She denies any chest pain.  Her orthopnea and paroxysmal nocturnal dyspnea has improved.  She still has +1 edema in both legs to the mid shin.  However her lungs are clear to auscultation bilaterally.  Taking Lasix 40 mg twice daily.  Recently the patient has been on Actos due to cost to help manage her diabetes.  After her recent hospitalization I question if this is a good option for this patient given the fact she was recently admitted with pulmonary vascular congestion.  Also wonder if she would benefit switching to Jardiance given the cardioprotective benefits particular for heart failure patients.  Far [employer] would also be a good option for her.  We spent approximately 10 minutes discussing this and the patient is very interested as long as she can afford the medication.  At that time, my plan was: Patient's blood pressure is elevated.  She is off metoprolol due to bradycardia.  Therefore I will replace the metoprolol with Cardura 2 mg a day to better control her blood pressure.  I will obtain a CBC as well as a CMP.  Continue Lasix 40 mg twice daily at the present time unless renal function is deteriorating as she still appears to have some residual edema in her legs.  I believe the cough is likely due to a upper respiratory infection I will treat that with Tessalon Perles.  I believe it would be better for this patient  to discontinue Actos given her recent admission due to heart failure and replacing that with either Jardiance or [place].  I will obtain lab work to determine if there is any contraindication and if not I will start the patient on Farxiga given its proven benefit with congestive heart failure.  I will also discontinue Actos.  Therefore I will check a hemoglobin A1c to monitor her glycemic control prior to making this change  10/02/18 Patient return for lab work 09/28/2018.  Creatinine had risen from 1.16-1.76.  At that point I recommended that she discontinue Lasix altogether and recheck this week her renal function as I was expecting that the patient had prerenal azotemia from aggressive diuresis. Wt Readings from Last 3 Encounters:  10/02/18 169 lb (76.7 kg)  08/14/18 182 lb (82.6 kg)  08/12/18 179 lb 6.4 oz (81.4 kg)   Patient has lost 13 pounds since when I saw her in December.  The majority of that is fluid although she also has not been eating well because she is under a lot of stress with her brother battling cancer and she lost a friend who died at Christmas.  Unfortunately there was confusion and the patient is still taking Lasix 80 mg twice daily.  She is also recently started [place].  In addition she is also on  metformin and quinapril.  All this could be contributing to her rising creatinine.  She denies any dysuria or hematuria urgency or frequency.  She does feel weak and dizzy and is also experiencing some low blood sugars in the 70s.    Past Medical History:  Diagnosis Date  . Arthritis   . CAD (coronary artery disease)    S/P cabg  . Carotid artery occlusion   . Cataract   . CHF (congestive heart failure) ([place])   . DDD (degenerative disc disease), lumbar   . Diabetes mellitus   . Hyperlipidemia   . Hypertension   . Joint pain   . Leg pain   . Myocardial infarction ([place]) 1998  . Peripheral vascular disease ([place])   . PVD (peripheral vascular disease) ([place])   . Stroke [facility])     Past Surgical History:  Procedure Laterality Date  . APPENDECTOMY    . BREAST EXCISIONAL BIOPSY Left 2008  . CHOLECYSTECTOMY     Gall bladder  . COLONOSCOPY  2008  . CORONARY ARTERY BYPASS GRAFT     1998  . LEFT HEART CATH AND CORS/GRAFTS ANGIOGRAPHY N/A 12/22/2017   Procedure: LEFT HEART CATH AND CORS/GRAFTS ANGIOGRAPHY;  Surgeon: Burnell Blanks, MD;  Location: [facility];  Service: Cardiovascular;  Laterality: N/A;  . PR VEIN BYPASS GRAFT,AORTO-FEM-POP    . TEE WITHOUT CARDIOVERSION N/A 01/19/2018   Procedure: TRANSESOPHAGEAL ECHOCARDIOGRAM (TEE);  Surgeon: Josue Hector, MD;  Location: [facility] ENDOSCOPY;  Service: Cardiovascular;  Laterality: N/A;  . TUBAL LIGATION     Current Outpatient Medications on File Prior to Visit  Medication Sig Dispense Refill  . amLODipine (NORVASC) 5 MG tablet TAKE 1 TABLET BY MOUTH  DAILY (Patient taking differently: Take 5 mg by mouth daily. ) 90 tablet 1  . benzonatate (TESSALON) 100 MG capsule Take 2 capsules (200 mg total) by mouth 3 (three) times daily as needed for cough. 20 capsule 0  . CINNAMON PO Take 1,000 mg by mouth 2 (two) times daily.      . clopidogrel (PLAVIX) 75 MG tablet TAKE 1 TABLET BY MOUTH EVERY DAY (Patient taking differently: Take 75 mg by mouth daily. ) 90 tablet 3  . Coenzyme Q10 (CO Q 10 PO) Take 100 mg by mouth daily.      Marland Kitchen doxazosin (CARDURA) 2 MG tablet Take 1 tablet (2 mg total) by mouth daily. 30 tablet 2  . empagliflozin (JARDIANCE) 25 MG TABS tablet Take 25 mg by mouth daily. 30 tablet 5  . fluticasone (FLONASE) 50 MCG/ACT nasal spray Place 2 sprays into both nostrils daily as needed for allergies.    . furosemide (LASIX) 20 MG tablet Take 2 tablets (40 mg total) by mouth 2 (two) times daily. 60 tablet 0  . Insulin Isophane & Regular Human (HUMULIN 70/30 KWIKPEN) (70-30) 100 UNIT/ML PEN 15 units with breakfast and 7 units with supper (Patient taking differently: Inject 7-15 Units into the skin See admin  instructions. Take 15 units in the morning and 7 units at night) 15 mL 11  . LIVALO 4 MG TABS TAKE 1 TABLET BY MOUTH EVERY DAY 90 tablet 1  . nitroGLYCERIN (NITROSTAT) 0.4 MG SL tablet Place 1 tablet (0.4 mg total) under the tongue every 5 (five) minutes as needed for chest pain. 90 tablet 2  . potassium chloride (K-DUR) 10 MEQ tablet TAKE 2 TABLETS BY MOUTH DAILY 90 tablet 3  . quinapril (ACCUPRIL) 40 MG tablet TAKE 1 TABLET BY MOUTH  TWO  TIMES DAILY (Patient taking differently: Take 40 mg by mouth 2 (two) times daily. ) 180 tablet 3  . traMADol (ULTRAM) 50 MG tablet Take 1 tablet (50 mg total) by mouth every 8 (eight) hours as needed. (Patient taking differently: Take 50 mg by mouth every 8 (eight) hours as needed for moderate pain. ) 180 tablet 0  . potassium chloride (K-DUR) 10 MEQ tablet Take 1 tablet (10 mEq total) by mouth daily. 30 tablet 11   No current facility-administered medications on file prior to visit.    Allergies  Allergen Reactions  . Levaquin [Levofloxacin In D5w] Other (See Comments)    Pain all over and in joints  . Statins Other (See Comments)    Severe myalgias to lipitor, crestor, pravastatin and weakness and cramping  in bilateral leg.  [name] [Linagliptin] Itching   Social History   Socioeconomic History  . Marital status: Divorced    Spouse name: Not on file  . Number of children: Not on file  . Years of education: Not on file  . Highest education level: Not on file  Occupational History  . Not on file  Social Needs  . Financial resource strain: Not on file  . Food insecurity:    Worry: Not on file    Inability: Not on file  . Transportation needs:    Medical: Not on file    Non-medical: Not on file  Tobacco Use  . Smoking status: Former Smoker    Packs/day: 0.25    Types: Cigarettes    Last attempt to quit: 09/13/2011    Years since quitting: 7.0  . Smokeless tobacco: Never Used  Substance and Sexual Activity  . Alcohol use: No  . Drug  use: No  . Sexual activity: Not on file  Lifestyle  . Physical activity:    Days per week: Not on file    Minutes per session: Not on file  . Stress: Not on file  Relationships  . Social connections:    Talks on phone: Not on file    Gets together: Not on file    Attends religious service: Not on file    Active member of club or organization: Not on file    Attends meetings of clubs or organizations: Not on file    Relationship status: Not on file  . Intimate partner violence:    Fear of current or ex partner: Not on file    Emotionally abused: Not on file    Physically abused: Not on file    Forced sexual activity: Not on file  Other Topics Concern  . Not on file  Social History Narrative  . Not on file    Review of Systems  All other systems reviewed and are negative.      Objective:   Physical Exam Vitals signs reviewed.  Constitutional:      Appearance: She is well-developed.  Cardiovascular:     Rate and Rhythm: Normal rate and regular rhythm.     Heart sounds: Normal heart sounds. No murmur. No friction rub. No gallop.   Pulmonary:     Effort: Pulmonary effort is normal. No respiratory distress.     Breath sounds: Normal breath sounds. No stridor. No wheezing or rales.  Abdominal:     General: Bowel sounds are normal.     Palpations: Abdomen is soft.  Musculoskeletal:     Right lower leg: No edema.     Left lower leg: No edema.  Assessment & Plan:  Renal insufficiency - Plan: Urinalysis, Routine w reflex microscopic, BASIC METABOLIC PANEL WITH GFR  I believe this is renal insufficiency due to prerenal azotemia.  I recommended the patient discontinue Lasix, potassium, and Jardiance.  I will obtain a baseline BMP today as well as a urinalysis.  I will recheck the patient in 1 week to reassess her renal function and also see if she starts to swell once we discontinue the Lasix.  If she is not swelling we will not resume the Lasix or the potassium.   I would like to resume the Jardiance if the patient can tolerate it.  If her renal function continues to decline we will need to discontinue metformin.

## 2018-10-12 ENCOUNTER — Ambulatory Visit (INDEPENDENT_AMBULATORY_CARE_PROVIDER_SITE_OTHER): Payer: Medicare Other | Admitting: Family Medicine

## 2018-10-12 ENCOUNTER — Encounter: Payer: Self-pay | Admitting: Family Medicine

## 2018-10-12 VITALS — BP 148/92 | HR 78 | Temp 97.9°F | Resp 14 | Ht 64.0 in | Wt 171.0 lb

## 2018-10-12 DIAGNOSIS — N289 Disorder of kidney and ureter, unspecified: Secondary | ICD-10-CM

## 2018-10-12 MED ORDER — CLOTRIMAZOLE-BETAMETHASONE 1-0.05 % EX CREA: 1.0000 "application " | TOPICAL_CREAM | Freq: Two times a day (BID) | CUTANEOUS | 0 refills | Status: AC

## 2018-10-12 NOTE — Progress Notes (Signed)
Subjective:    Patient ID: Brenda Moreno, female    DOB: 12/21/1945, 73 y.o.   MRN: [id number]  Patient is here today for hospital discharge follow-up.  She was recently admitted to the hospital with congestive heart failure.  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 She reports she was taking care of her brother who is a cancer patient and possibly acquired some URI symptoms with productive white phlegm. She denies any fevers or chills, headache, dizziness or palpitations. For past 4-5 days she started developing chest heaviness and increasing dyspnea on exertion which progressed to having orthopnea and unable to sleep flat at night. She went to her PCP office 3 days back where she was instructed to take increased dose of Lasix (40 mg daily instead of 20 mg she is on). She called her cardiology office and got scheduled to be seen next week only. However with concern that she has to get up in the night and urinate often on high dose of Lasix she did not take increased dose and continue to take 20 mg daily. Patient denies any fevers, chills, abdominal pain, dysuria, diarrhea, tingling or numbness of extremities, fall or syncope. No change in appetite but patient feels she may have gained a few pounds.  Course in the ED Patient was afebrile, respiratory rate in the 20s, blood pressure elevated to 194/103 mmHg, O2 sat stable on room air. However she was bradycardic to 42 but asymptomatic. Blood work showed WBC of 7.5, hemoglobin of 10.6, platelet of 201, normal electrolytes and renal function,  glucose of 232 and BNP close to 500 chest x-ray showed pulmonary vascular congestion. EKG showed sinus bradycardia at 45, no ST-T changes. Patient given 60 mg IV Lasix and hospitalist consulted for admission to telemetryfor acute on chronic diastolic CHF and sinus bradycardia   Review of systems:   In addition to the HPI above,(positive symptoms in bold) No Fever-chills, No Headache, No changes with Vision or hearing, No problems swallowing food or Liquids, Chest tightness, productive cough, shortness of breath, orthopnea, PND,chronic leg swellings No Abdominal pain, No Nausea orvomiting, bowel movements are regular, No Blood in stool or Urine, No dysuria, No new skin rashes or bruises, No new joints pains-aches,  No new weakness, tingling, numbness in any extremity, Recent weight gain, No polyuria, polydypsia or polyphagia, No significant Mental Stressors.          Hospital Course:  Acute on chronic diastolic CHF (congestive heart failure) (HCC) No exact triggering symptoms except for recent viral URI. Admitted to telemetry. Placed on IV Lasix 60 mg every 12 hours. Strict I's/O and daily weight. Continued daily potassium supplement. By next day off oxygen with sats> 95% on RA and ambulatory without any compliants  Active Problems: Sinus bradycardia Asymptomatic. Heart rate in mid to high 40s on the monitor. Held beta-blocker. (She is on Toprol 100 mg daily). Still bradycardia by next day - out-patient follow up to consider reduced dose of B-blocker    Benign essential HTN Blood pressure elevated. Holding beta-blocker due to bradycardia. Resume amlodipine and quinapril. I will place her on PRN IV hydralazine  Diabetes  mellitus, type II, insulin dependent, uncontrolled with hyperglycemia(HCC) Last A1c of 7.8 in September. Continue home dose Lantus. Holding metformin and Actos. Monitor on sliding scale coverage  PVD (peripheral  vascular disease) (HCC)/history of ischemic stroke Coronary artery disease involving native coronary artery of native heart without angina pectoris History of CABG. Continue aspirin (Plavix discontinued after dual antiplatelet coverage for 3 months in August). On Livalo for hyperlipidemia as patient is intolerant to statin.   Mild drop in H&H About 1 g from baseline. (12) monitor for now.   08/14/18 Patient is here today for follow-up.  She continues to have a cough but she attributes this to an upper respiratory infection.  The cough is nonproductive.  She denies any fevers.  She denies any chest pain.  Her orthopnea and paroxysmal nocturnal dyspnea has improved.  She still has +1 edema in both legs to the mid shin.  However her lungs are clear to auscultation bilaterally.  Taking Lasix 40 mg twice daily.  Recently the patient has been on Actos due to cost to help manage her diabetes.  After her recent hospitalization I question if this is a good option for this patient given the fact she was recently admitted with pulmonary vascular congestion.  Also wonder if she would benefit switching to Jardiance given the cardioprotective benefits particular for heart failure patients.  Far [employer] would also be a good option for her.  We spent approximately 10 minutes discussing this and the patient is very interested as long as she can afford the medication.  At that time, my plan was: Patient's blood pressure is elevated.  She is off metoprolol due to bradycardia.  Therefore I will replace the metoprolol with Cardura 2 mg a day to better control her blood pressure.  I will obtain a CBC as well as a CMP.  Continue Lasix 40 mg twice daily at the present time unless renal function is deteriorating as she still appears to have some residual edema in her legs.  I believe the cough is likely due to a upper respiratory infection I will treat that with Tessalon Perles.  I believe it would be better for this patient  to discontinue Actos given her recent admission due to heart failure and replacing that with either Jardiance or [place].  I will obtain lab work to determine if there is any contraindication and if not I will start the patient on Farxiga given its proven benefit with congestive heart failure.  I will also discontinue Actos.  Therefore I will check a hemoglobin A1c to monitor her glycemic control prior to making this change  10/02/18 Patient return for lab work 09/28/2018.  Creatinine had risen from 1.16-1.76.  At that point I recommended that she discontinue Lasix altogether and recheck this week her renal function as I was expecting that the patient had prerenal azotemia from aggressive diuresis. Wt Readings from Last 3 Encounters:  10/12/18 177 lb (80.3 kg)  10/02/18 169 lb (76.7 kg)  08/14/18 182 lb (82.6 kg)   Patient has lost 13 pounds since when I saw her in December.  The majority of that is fluid although she also has not been eating well because she is under a lot of stress with her brother battling cancer and she lost a friend who died at Christmas.  Unfortunately there was confusion and the patient is still taking Lasix 80 mg twice daily.  She is also recently started [place].  In addition she is also on metformin and  quinapril.  All this could be contributing to her rising creatinine.  She denies any dysuria or hematuria urgency or frequency.  She does feel weak and dizzy and is also experiencing some low blood sugars in the 70s.  At that time, my plan was: I believe this is renal insufficiency due to prerenal azotemia.  I recommended the patient discontinue Lasix, potassium, and Jardiance.  I will obtain a baseline BMP today as well as a urinalysis.  I will recheck the patient in 1 week to reassess her renal function and also see if she starts to swell once we discontinue the Lasix.  If she is not swelling we will not resume the Lasix or the potassium.  I would like to resume the Jardiance if  the patient can tolerate it.  If her renal function continues to decline we will need to discontinue metformin.  10/12/18 Patient has been off her Lasix, potassium, and Jardiance now for 10 days.  Despite this, she is only gained 2 pounds there is no pitting edema in her legs.  Her lungs are clear to auscultation bilaterally.  She denies any shortness of breath or dyspnea on exertion.  She denies any orthopnea.  Her weight has increased from 169 pounds to 171 pounds.  Here today to recheck her renal function Past Medical History:  Diagnosis Date  . Arthritis   . CAD (coronary artery disease)    S/P cabg  . Carotid artery occlusion   . Cataract   . CHF (congestive heart failure) ([place])   . DDD (degenerative disc disease), lumbar   . Diabetes mellitus   . Hyperlipidemia   . Hypertension   . Joint pain   . Leg pain   . Myocardial infarction ([place]) 1998  . Peripheral vascular disease ([place])   . PVD (peripheral vascular disease) ([place])   . Stroke [facility])    Past Surgical History:  Procedure Laterality Date  . APPENDECTOMY    . BREAST EXCISIONAL BIOPSY Left 2008  . CHOLECYSTECTOMY     Gall bladder  . COLONOSCOPY  2008  . CORONARY ARTERY BYPASS GRAFT     1998  . LEFT HEART CATH AND CORS/GRAFTS ANGIOGRAPHY N/A 12/22/2017   Procedure: LEFT HEART CATH AND CORS/GRAFTS ANGIOGRAPHY;  Surgeon: Burnell Blanks, MD;  Location: [facility];  Service: Cardiovascular;  Laterality: N/A;  . PR VEIN BYPASS GRAFT,AORTO-FEM-POP    . TEE WITHOUT CARDIOVERSION N/A 01/19/2018   Procedure: TRANSESOPHAGEAL ECHOCARDIOGRAM (TEE);  Surgeon: Josue Hector, MD;  Location[facility] ENDOSCOPY;  Service: Cardiovascular;  Laterality: N/A;  . TUBAL LIGATION     Current Outpatient Medications on File Prior to Visit  Medication Sig Dispense Refill  . CINNAMON PO Take 1,000 mg by mouth 2 (two) times daily.      . clopidogrel (PLAVIX) 75 MG tablet TAKE 1 TABLET BY MOUTH EVERY DAY 90 tablet 3  . Coenzyme Q10 (CO Q  10 PO) Take 100 mg by mouth daily.      Marland Kitchen doxazosin (CARDURA) 2 MG tablet Take 1 tablet (2 mg total) by mouth daily. 30 tablet 2  . fluticasone (FLONASE) 50 MCG/ACT nasal spray Place 2 sprays into both nostrils daily as needed for allergies.    . Insulin Isophane & Regular Human (HUMULIN 70/30 KWIKPEN) (70-30) 100 UNIT/ML PEN 15 units with breakfast and 7 units with supper (Patient taking differently: Inject 7-15 Units into the skin See admin instructions. Take 15 units in the morning and 7 units at  night) 15 mL 11  . LIVALO 4 MG TABS TAKE 1 TABLET BY MOUTH EVERY DAY 90 tablet 1  . metFORMIN (GLUCOPHAGE) 1000 MG tablet Take 1 tablet (1,000 mg total) by mouth 2 (two) times daily with a meal. 180 tablet 1  . nitroGLYCERIN (NITROSTAT) 0.4 MG SL tablet Place 1 tablet (0.4 mg total) under the tongue every 5 (five) minutes as needed for chest pain. 90 tablet 2  . quinapril (ACCUPRIL) 40 MG tablet TAKE 1 TABLET BY MOUTH TWO  TIMES DAILY (Patient taking differently: Take 40 mg by mouth 2 (two) times daily. ) 180 tablet 3  . traMADol (ULTRAM) 50 MG tablet Take 1 tablet (50 mg total) by mouth every 8 (eight) hours as needed. (Patient taking differently: Take 50 mg by mouth every 8 (eight) hours as needed for moderate pain. ) 180 tablet 0  . amLODipine (NORVASC) 5 MG tablet TAKE 1 TABLET BY MOUTH  DAILY (Patient not taking: Reported on 10/12/2018) 90 tablet 1  . empagliflozin (JARDIANCE) 25 MG TABS tablet Take 25 mg by mouth daily. (Patient not taking: Reported on 10/12/2018) 30 tablet 5  . furosemide (LASIX) 20 MG tablet Take 2 tablets (40 mg total) by mouth 2 (two) times daily. (Patient not taking: Reported on 10/12/2018) 60 tablet 0  . potassium chloride (K-DUR) 10 MEQ tablet TAKE 2 TABLETS BY MOUTH DAILY (Patient not taking: Reported on 10/12/2018) 90 tablet 3   No current facility-administered medications on file prior to visit.    Allergies  Allergen Reactions  . Levaquin [Levofloxacin In D5w] Other (See  Comments)    Pain all over and in joints  . Statins Other (See Comments)    Severe myalgias to lipitor, crestor, pravastatin and weakness and cramping  in bilateral leg.  [name] [Linagliptin] Itching   Social History   Socioeconomic History  . Marital status: Divorced    Spouse name: Not on file  . Number of children: Not on file  . Years of education: Not on file  . Highest education level: Not on file  Occupational History  . Not on file  Social Needs  . Financial resource strain: Not on file  . Food insecurity:    Worry: Not on file    Inability: Not on file  . Transportation needs:    Medical: Not on file    Non-medical: Not on file  Tobacco Use  . Smoking status: Former Smoker    Packs/day: 0.25    Types: Cigarettes    Last attempt to quit: 09/13/2011    Years since quitting: 7.0  . Smokeless tobacco: Never Used  Substance and Sexual Activity  . Alcohol use: No  . Drug use: No  . Sexual activity: Not on file  Lifestyle  . Physical activity:    Days per week: Not on file    Minutes per session: Not on file  . Stress: Not on file  Relationships  . Social connections:    Talks on phone: Not on file    Gets together: Not on file    Attends religious service: Not on file    Active member of club or organization: Not on file    Attends meetings of clubs or organizations: Not on file    Relationship status: Not on file  . Intimate partner violence:    Fear of current or ex partner: Not on file    Emotionally abused: Not on file    Physically abused: Not on file  Forced sexual activity: Not on file  Other Topics Concern  . Not on file  Social History Narrative  . Not on file    Review of Systems  All other systems reviewed and are negative.      Objective:   Physical Exam Vitals signs reviewed.  Constitutional:      Appearance: She is well-developed.  Cardiovascular:     Rate and Rhythm: Normal rate and regular rhythm.     Heart sounds: Normal  heart sounds. No murmur. No friction rub. No gallop.   Pulmonary:     Effort: Pulmonary effort is normal. No respiratory distress.     Breath sounds: Normal breath sounds. No stridor. No wheezing or rales.  Abdominal:     General: Bowel sounds are normal.     Palpations: Abdomen is soft.  Musculoskeletal:     Right lower leg: No edema.     Left lower leg: No edema.           Assessment & Plan:  Renal insufficiency - Plan: BASIC METABOLIC PANEL WITH GFR  I believe this was due solely to prerenal azotemia secondary to medication.  I will recheck her BMP.  She does not appear to require Lasix or potassium at the present time.  She has maintain a euvolemic state despite not taking her diuretic.  Therefore of asked the patient to simply monitor her body for any signs of swelling or shortness of breath.  We can use the diuretic on an as-needed basis.  If her renal function has improved, I will try to resume the patient's Jardiance for the secondary cardioprotective benefits.  If there is persistent renal insufficiency, I would recommend a renal ultrasound.  Urinalysis was obtained on February 4 which revealed no blood.  There was no cast or sediment.  There was 2+ glucose as one would expect for a patient who is taking Jardiance.

## 2018-10-13 LAB — BASIC METABOLIC PANEL WITH GFR
BUN/Creatinine Ratio: 15 (calc) (ref 6–22)
BUN: 18 mg/dL (ref 7–25)
CO2: 24 mmol/L (ref 20–32)
Calcium: 9.6 mg/dL (ref 8.6–10.4)
Chloride: 110 mmol/L (ref 98–110)
Creat: 1.23 mg/dL — ABNORMAL HIGH (ref 0.60–0.93)
GFR, EST NON AFRICAN AMERICAN: 44 mL/min/{1.73_m2} — AB (ref >=60)
GFR, Est African American: 51 mL/min/{1.73_m2} — ABNORMAL LOW (ref >=60)
Glucose, Bld: 150 mg/dL — ABNORMAL HIGH (ref 65–99)
Potassium: 4.5 mmol/L (ref 3.5–5.3)
Sodium: 144 mmol/L (ref 135–146)

## 2018-10-16 ENCOUNTER — Other Ambulatory Visit: Payer: Self-pay | Admitting: Family Medicine

## 2018-10-16 DIAGNOSIS — E1169 Type 2 diabetes mellitus with other specified complication: Secondary | ICD-10-CM

## 2018-10-16 DIAGNOSIS — Z794 Long term (current) use of insulin: Secondary | ICD-10-CM

## 2018-10-16 DIAGNOSIS — N289 Disorder of kidney and ureter, unspecified: Secondary | ICD-10-CM

## 2018-10-22 ENCOUNTER — Other Ambulatory Visit: Payer: Self-pay | Admitting: Family Medicine

## 2018-11-06 ENCOUNTER — Other Ambulatory Visit: Payer: Self-pay | Admitting: *Deleted

## 2018-11-06 MED ORDER — DOXAZOSIN MESYLATE 2 MG PO TABS: 2.0000 mg | ORAL_TABLET | Freq: Every day | ORAL | 2 refills | Status: DC

## 2018-12-05 ENCOUNTER — Telehealth: Payer: Self-pay

## 2018-12-05 NOTE — Telephone Encounter (Signed)
I called pt about her appt on 12/11/2018 at 215pm. I stated no face to face per COVID 19 concerns. I explain we are doing video visits only. Pt has a lap top and her son has a Stage manager. Pt given webex link to download on either device. Pt gave email address to receive appt invite. Pt gave consent for video and file insurance. Pt will have son help on visit day and to download web ex. Pts meds were updated and PCP, and pharmacy.

## 2018-12-07 ENCOUNTER — Ambulatory Visit: Payer: Medicare Other | Admitting: Adult Health

## 2018-12-11 ENCOUNTER — Telehealth: Payer: Self-pay | Admitting: Adult Health

## 2018-12-11 ENCOUNTER — Other Ambulatory Visit: Payer: Self-pay

## 2018-12-11 ENCOUNTER — Ambulatory Visit: Payer: Medicare Other | Admitting: Adult Health

## 2018-12-11 NOTE — Telephone Encounter (Signed)
Pt will have to call back to r/s when she get help with video visit when granddaughter is available.

## 2018-12-11 NOTE — Progress Notes (Deleted)
[facility] [address] [place]. [place] [address] [phone]       OFFICE FOLLOW UP NOTE  [name] Date of Birth:  1946/03/03 Medical Record Number:  [id number]   Reason for visit: Stroke follow up  CHIEF COMPLAINT:  No chief complaint on file.   HPI:       Brenda Moreno is a 73 y.o. female with underlying medical history of HTN, HLD w/ statin intolerance, and DM who has been followed in this office since right paramedian pontine infarct in 12/2017. She was initially scheduled for face-to-face office follow up visit but due to [place] safety precautions, visit transitioned to telemedicine via WebEx. At prior visit on 06/01/18, she has recovered from a stroke standpoint without residual deficits or reoccurring of symptoms. She continues to do well at this 6 month follow up from a stroke stand point. Continues on plavix without side effects of bleeding or bruising. She continues on pitavastatin without side effects of myalgias. Blood pressure ***.       ROS:   14 system review of systems performed and negative with exception of dizziness   PMH:  Past Medical History:  Diagnosis Date   Arthritis    CAD (coronary artery disease)    S/P cabg   Carotid artery occlusion    Cataract    CHF (congestive heart failure) (HCC)    DDD (degenerative disc disease), lumbar    Diabetes mellitus    Hyperlipidemia    Hypertension    Joint pain    Leg pain    Myocardial infarction [facility]) 1998   Peripheral vascular disease ([place])    PVD (peripheral vascular disease) ([place])    Stroke ([place])     PSH:  Past Surgical History:  Procedure Laterality Date   APPENDECTOMY     BREAST EXCISIONAL BIOPSY Left 2008   CHOLECYSTECTOMY     Gall bladder   COLONOSCOPY  2008   CORONARY ARTERY BYPASS GRAFT     1998   LEFT HEART CATH AND CORS/GRAFTS ANGIOGRAPHY N/A 12/22/2017   Procedure: LEFT HEART CATH AND CORS/GRAFTS ANGIOGRAPHY;  Surgeon:  Burnell Blanks, MD;  Location: [place] [facility];  Service: Cardiovascular;  Laterality: N/A;   PR VEIN BYPASS GRAFT,AORTO-FEM-POP     TEE WITHOUT CARDIOVERSION N/A 01/19/2018   Procedure: TRANSESOPHAGEAL ECHOCARDIOGRAM (TEE);  Surgeon: Josue Hector, MD;  Location: [facility] ENDOSCOPY;  Service: Cardiovascular;  Laterality: N/A;   TUBAL LIGATION      Social History:  Social History   Socioeconomic History   Marital status: Divorced    Spouse name: Not on file   Number of children: Not on file   Years of education: Not on file   Highest education level: Not on file  Occupational History   Not on file  Social Needs   Financial resource strain: Not on file   Food insecurity:    Worry: Not on file    Inability: Not on file   Transportation needs:    Medical: Not on file    Non-medical: Not on file  Tobacco Use   Smoking status: Former Smoker    Packs/day: 0.25    Types: Cigarettes    Last attempt to quit: 09/13/2011    Years since quitting: 7.2   Smokeless tobacco: Never Used  Substance and Sexual Activity   Alcohol use: No   Drug use: No   Sexual activity: Not on file  Lifestyle   Physical activity:  Days per week: Not on file    Minutes per session: Not on file   Stress: Not on file  Relationships   Social connections:    Talks on phone: Not on file    Gets together: Not on file    Attends religious service: Not on file    Active member of club or organization: Not on file    Attends meetings of clubs or organizations: Not on file    Relationship status: Not on file   Intimate partner violence:    Fear of current or ex partner: Not on file    Emotionally abused: Not on file    Physically abused: Not on file    Forced sexual activity: Not on file  Other Topics Concern   Not on file  Social History Narrative   Not on file    Family History:  Family History  Problem Relation Age of Onset   Heart disease Mother    Diabetes  Mother    Hyperlipidemia Mother    Hypertension Mother    Stroke Mother    Heart disease Father        Heart Disease before age 65   Diabetes Father    Hyperlipidemia Father    Hypertension Father    Heart disease Sister        heart attack   Diabetes Sister        Amputation   Hypertension Sister    Other Sister        history of amputation   Heart attack Sister    Diabetes Brother    Hypertension Brother    Heart disease Brother 76       Before age 77   Hyperlipidemia Brother    Diabetes Brother        scirrosis of liver   Coronary artery disease Other    Colon cancer Neg Hx    Stomach cancer Neg Hx    Esophageal cancer Neg Hx     Medications:   Current Outpatient Medications on File Prior to Visit  Medication Sig Dispense Refill   amLODipine (NORVASC) 5 MG tablet TAKE 1 TABLET BY MOUTH  DAILY (Patient not taking: Reported on 10/12/2018) 90 tablet 1   CINNAMON PO Take 1,000 mg by mouth 2 (two) times daily.       clopidogrel (PLAVIX) 75 MG tablet TAKE 1 TABLET BY MOUTH EVERY DAY 90 tablet 3   clotrimazole-betamethasone (LOTRISONE) cream Apply 1 application topically 2 (two) times daily. 30 g 0   Coenzyme Q10 (CO Q 10 PO) Take 100 mg by mouth daily.       doxazosin (CARDURA) 2 MG tablet Take 1 tablet (2 mg total) by mouth daily. 30 tablet 2   empagliflozin (JARDIANCE) 25 MG TABS tablet Take 25 mg by mouth daily. (Patient not taking: Reported on 10/12/2018) 30 tablet 5   fluticasone (FLONASE) 50 MCG/ACT nasal spray Place 2 sprays into both nostrils daily as needed for allergies.     furosemide (LASIX) 20 MG tablet Take 2 tablets (40 mg total) by mouth 2 (two) times daily. (Patient not taking: Reported on 10/12/2018) 60 tablet 0   Insulin Isophane & Regular Human (HUMULIN 70/30 KWIKPEN) (70-30) 100 UNIT/ML PEN 15 units with breakfast and 7 units with supper (Patient taking differently: Inject 7-15 Units into the skin See admin instructions. Take 15  units in the morning and 7 units at night) 15 mL 11   LIVALO 4 MG TABS TAKE 1  TABLET BY MOUTH EVERY DAY 90 tablet 1   metFORMIN (GLUCOPHAGE) 1000 MG tablet TAKE 1 TABLET BY MOUTH  TWICE A DAY WITH MEALS 180 tablet 1   nitroGLYCERIN (NITROSTAT) 0.4 MG SL tablet Place 1 tablet (0.4 mg total) under the tongue every 5 (five) minutes as needed for chest pain. 90 tablet 2   potassium chloride (K-DUR) 10 MEQ tablet TAKE 2 TABLETS BY MOUTH DAILY (Patient not taking: Reported on 10/12/2018) 90 tablet 3   quinapril (ACCUPRIL) 40 MG tablet TAKE 1 TABLET BY MOUTH TWO  TIMES DAILY (Patient taking differently: Take 40 mg by mouth 2 (two) times daily. ) 180 tablet 3   traMADol (ULTRAM) 50 MG tablet Take 1 tablet (50 mg total) by mouth every 8 (eight) hours as needed. (Patient taking differently: Take 50 mg by mouth every 8 (eight) hours as needed for moderate pain. ) 180 tablet 0   No current facility-administered medications on file prior to visit.     Allergies:   Allergies  Allergen Reactions   Levaquin [Levofloxacin In D5w] Other (See Comments)    Pain all over and in joints   Statins Other (See Comments)    Severe myalgias to lipitor, crestor, pravastatin and weakness and cramping  in bilateral leg.   Tradjenta [Linagliptin] Itching     Physical Exam  There were no vitals filed for this visit. There is no height or weight on file to calculate BMI. No exam data present  General: well developed, well nourished, pleasant elderly female, seated, in no evident distress Head: head normocephalic and atraumatic.   Neck: supple with no carotid or supraclavicular bruits Cardiovascular: regular rate and rhythm, no murmurs Musculoskeletal: no deformity Skin:  no rash/petichiae Vascular:  Normal pulses all extremities  Neurologic Exam Mental Status: Awake and fully alert. Oriented to place and time. Recent and remote memory intact. Attention span, concentration and fund of knowledge  appropriate. Mood and affect appropriate.  Cranial Nerves: Fundoscopic exam reveals sharp disc margins. Pupils equal, briskly reactive to light. Extraocular movements full without nystagmus. Visual fields full to confrontation. Hearing intact. Facial sensation intact. Face, tongue, palate moves normally and symmetrically.  Motor: Normal bulk and tone. Normal strength in all tested extremity muscles. Sensory.: intact to touch , pinprick , position and vibratory sensation.  Coordination: Rapid alternating movements normal in all extremities. Finger-to-nose and heel-to-shin performed accurately bilaterally. Gait and Station: Arises from chair without difficulty. Stance is normal. Gait demonstrates normal stride length and balance . Able to heel, toe and tandem walk without difficulty.  Reflexes: 1+ and symmetric. Toes downgoing.      Diagnostic Data (Labs, Imaging, Testing)  CT head wo contrast CT angio head/neck w or wo contrast 01/16/2018 IMPRESSION: CT head: 1. No acute intracranial abnormality identified. 2. Stable chronic microvascular ischemic changes and parenchymal volume loss of the brain. CTA neck: 1. Patent carotid and vertebral arteries. No significant stenosis by NASCET criteria, dissection, or occlusion. 2. Calcified plaque bilateral vertebral artery origins with mild less than 50% stenosis. 3. Calcified plaque of left subclavian artery origin, partially obscured by streak artifact, approximately 50% mild-to-moderate stenosis. CTA head: 1. Patent anterior and posterior intracranial circulation. No high-grade stenosis, aneurysm, large vessel occlusion, or vascular malformation. 2. Multiple segments of mild stenosis in the anterior and posterior circulations as well as mild-to-moderate distal left cavernous ICA Stenosis.  MR brain wo contrast 01/16/2018 IMPRESSION: 1. Subcentimeter focus of acute/early subacute infarction extending from the right paramedian midbrain  the right  superior cerebellar peduncle. No associated hemorrhage or mass effect. 2. Small chronic infarctions are present within the left cerebellar hemisphere, upper pons, right genu of internal capsule, thalamus. 3. Moderate for age chronic microvascular ischemic changes and parenchymal volume loss of the brain.   2D Echocardiogram  - Left ventricle: The cavity size was normal. Systolic function wasnormal. The estimated ejection fraction was in the range of 60%to 65%. Wall motion was normal; there were no regional wallmotion abnormalities. The study is not technically sufficient toallow evaluation of LV diastolic function. - Aortic valve: Severe focal calcification involving thenoncoronary cusp. - Mitral valve: There was trivial regurgitation. - Left atrium: There is a 3.2 x 1.07 oblong density in the LA thatcould represent thrombus. Recommend TEE for further delineation. - Atrial septum: There was increased thickness of the septum,consistent with lipomatous hypertrophy. - Pulmonic valve: There was trivial regurgitation. - Pulmonary arteries: PA peak pressure: 39 mm Hg (S). Impressions:   Possible LA mass although only seen in the parasternal long axisview. Recommend TEE for further delineation. The rightventricular systolic pressure was increased consistent with mildpulmonary hypertension.  TEE EF 60% No LAA thrombus and no LA mass Mild to moderate MR AV sclerosis Normal aortic root with somewhat bulky calcific atherosclerosis in the arch Normal RV Negative bubble study No PFO No effusion      ASSESSMENT: Brenda Moreno is a 73 y.o. 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 Vascular risk factors include HTN, HLD and DM.  Patient is being seen today for stroke follow-up and overall has been stable from a stroke standpoint.  Does have concerns of dizziness related to position changing along with left  shoulder pain that she believes is related to arthritis.    PLAN: -Continue clopidogrel 75 mg daily  and pitavastatin for secondary stroke prevention -F/u with PCP regarding your HLD, HLD and DM management -Advised to follow-up with PCP for shoulder pain as this could be related to arthritis but should be worked up for any other type of modalities such as tendinitis or rotator cuff issue -Dizziness related to position changes most likely is due to orthostatic hypotension and patient was educated on ways to lessen symptoms related to this such as leg movement prior to standing after prolonged sitting, increasing fluids and standing in one spot after position change until sensation resolves.  Advised to follow with PCP if continues or worsens -continue to monitor BP at home -Advised importance of eating healthy and stay active -Maintain strict control of hypertension with blood pressure goal below 130/90, diabetes with hemoglobin A1c goal below 6.5% and cholesterol with LDL cholesterol (bad cholesterol) goal below 70 mg/dL. I also advised the patient to eat a healthy diet with plenty of whole grains, cereals, fruits and vegetables, exercise regularly and maintain ideal body weight.  Follow up in 6 months or call earlier if needed   Greater than 50% of time during this 25 minute visit was spent on counseling,explanation of diagnosis of right paramedian pontine infarct, reviewing risk factor management of HLD, HTN and DM, planning of further management, discussion with patient and family and coordination of care    Venancio Poisson, [facility]  [facility] [address] [place], [place] [address]  Phone [phone] Fax [phone]

## 2018-12-11 NOTE — Telephone Encounter (Signed)
Patient stated granddaughter was not there to help her with the virtual visit as she was at work. [phone]

## 2018-12-18 ENCOUNTER — Telehealth: Payer: Self-pay | Admitting: Internal Medicine

## 2018-12-18 DIAGNOSIS — G459 Transient cerebral ischemic attack, unspecified: Secondary | ICD-10-CM | POA: Diagnosis not present

## 2018-12-18 DIAGNOSIS — H43813 Vitreous degeneration, bilateral: Secondary | ICD-10-CM | POA: Diagnosis not present

## 2018-12-18 DIAGNOSIS — D3132 Benign neoplasm of left choroid: Secondary | ICD-10-CM | POA: Diagnosis not present

## 2018-12-18 DIAGNOSIS — H3562 Retinal hemorrhage, left eye: Secondary | ICD-10-CM | POA: Diagnosis not present

## 2018-12-18 DIAGNOSIS — H35033 Hypertensive retinopathy, bilateral: Secondary | ICD-10-CM | POA: Diagnosis not present

## 2018-12-18 NOTE — Telephone Encounter (Signed)
Ok but must be ok with current PCP as well

## 2018-12-18 NOTE — Telephone Encounter (Signed)
Patient came into the office with Brenda Moreno (a current patient of yours). She is looking for a new primary care physican and would like to know if you would consider seeing her to establish care? Kyung Rudd highly recommended you and said that he would love if she would be able to see you as well. Please advise.

## 2018-12-19 NOTE — Telephone Encounter (Signed)
Called patient and informed. Appointment scheduled.  

## 2018-12-20 NOTE — Telephone Encounter (Signed)
Noted! Thank you

## 2018-12-20 NOTE — Telephone Encounter (Signed)
Pt called and said she wanted to r/s.  Pt was offered a date and time for next wed. She gave verbal consent to file insurance.  As conversation went further and pt was asked if she already had the app downloaded pt stated she thought this would be an appointment she would come to the office for.  Pt asked the appointment be cancelled she has an upcoming appointemnt with her eye Dr this upcoming week, she feels she may have had a small stroke last week, pt is unsure.  Pt will have the results from that appointment sent to NP [facility].  This is FYI, no call back requested.

## 2018-12-21 ENCOUNTER — Encounter: Payer: Self-pay | Admitting: Internal Medicine

## 2018-12-21 ENCOUNTER — Ambulatory Visit (INDEPENDENT_AMBULATORY_CARE_PROVIDER_SITE_OTHER): Payer: Medicare Other | Admitting: Internal Medicine

## 2018-12-21 ENCOUNTER — Other Ambulatory Visit: Payer: Self-pay

## 2018-12-21 ENCOUNTER — Other Ambulatory Visit (INDEPENDENT_AMBULATORY_CARE_PROVIDER_SITE_OTHER): Payer: Medicare Other

## 2018-12-21 VITALS — BP 142/86 | HR 65 | Temp 97.9°F | Ht 64.0 in | Wt 165.0 lb

## 2018-12-21 DIAGNOSIS — N183 Chronic kidney disease, stage 3 unspecified: Secondary | ICD-10-CM

## 2018-12-21 DIAGNOSIS — I5032 Chronic diastolic (congestive) heart failure: Secondary | ICD-10-CM | POA: Diagnosis not present

## 2018-12-21 DIAGNOSIS — J309 Allergic rhinitis, unspecified: Secondary | ICD-10-CM | POA: Insufficient documentation

## 2018-12-21 DIAGNOSIS — Z0000 Encounter for general adult medical examination without abnormal findings: Secondary | ICD-10-CM

## 2018-12-21 DIAGNOSIS — N189 Chronic kidney disease, unspecified: Secondary | ICD-10-CM

## 2018-12-21 DIAGNOSIS — Z794 Long term (current) use of insulin: Secondary | ICD-10-CM

## 2018-12-21 DIAGNOSIS — E119 Type 2 diabetes mellitus without complications: Secondary | ICD-10-CM

## 2018-12-21 DIAGNOSIS — Z23 Encounter for immunization: Secondary | ICD-10-CM | POA: Diagnosis not present

## 2018-12-21 DIAGNOSIS — M5432 Sciatica, left side: Secondary | ICD-10-CM | POA: Diagnosis not present

## 2018-12-21 DIAGNOSIS — Z0001 Encounter for general adult medical examination with abnormal findings: Secondary | ICD-10-CM | POA: Insufficient documentation

## 2018-12-21 HISTORY — DX: Chronic kidney disease, unspecified: N18.9

## 2018-12-21 LAB — URINALYSIS, ROUTINE W REFLEX MICROSCOPIC
Bilirubin Urine: NEGATIVE
Hgb urine dipstick: NEGATIVE
Ketones, ur: NEGATIVE
Leukocytes,Ua: NEGATIVE
Nitrite: NEGATIVE
RBC / HPF: NONE SEEN (ref 0–?)
Specific Gravity, Urine: 1.025 (ref 1.000–1.030)
Urine Glucose: NEGATIVE
Urobilinogen, UA: 0.2 (ref 0.0–1.0)
pH: 5.5 (ref 5.0–8.0)

## 2018-12-21 LAB — BASIC METABOLIC PANEL
BUN: 27 mg/dL — ABNORMAL HIGH (ref 6–23)
CO2: 23 mEq/L (ref 19–32)
Calcium: 9.2 mg/dL (ref 8.4–10.5)
Chloride: 109 mEq/L (ref 96–112)
Creatinine, Ser: 1.18 mg/dL (ref 0.40–1.20)
GFR: 54.65 mL/min — ABNORMAL LOW (ref >60.00)
Glucose, Bld: 179 mg/dL — ABNORMAL HIGH (ref 70–99)
Potassium: 4.2 mEq/L (ref 3.5–5.1)
Sodium: 141 mEq/L (ref 135–145)

## 2018-12-21 LAB — LIPID PANEL
Cholesterol: 147 mg/dL (ref 0–200)
HDL: 45.2 mg/dL (ref >39.00)
LDL Cholesterol: 80 mg/dL (ref 0–99)
NonHDL: 101.46
Total CHOL/HDL Ratio: 3
Triglycerides: 106 mg/dL (ref 0.0–149.0)
VLDL: 21.2 mg/dL (ref 0.0–40.0)

## 2018-12-21 LAB — CBC WITH DIFFERENTIAL/PLATELET
Basophils Absolute: 0.1 10*3/uL (ref 0.0–0.1)
Basophils Relative: 0.9 % (ref 0.0–3.0)
Eosinophils Absolute: 0.1 10*3/uL (ref 0.0–0.7)
Eosinophils Relative: 1.3 % (ref 0.0–5.0)
HCT: 42.4 % (ref 36.0–46.0)
Hemoglobin: 14.2 g/dL (ref 12.0–15.0)
Lymphocytes Relative: 35.7 % (ref 12.0–46.0)
Lymphs Abs: 2.6 10*3/uL (ref 0.7–4.0)
MCHC: 33.5 g/dL (ref 30.0–36.0)
MCV: 81.4 fl (ref 78.0–100.0)
Monocytes Absolute: 0.4 10*3/uL (ref 0.1–1.0)
Monocytes Relative: 5.1 % (ref 3.0–12.0)
Neutro Abs: 4.2 10*3/uL (ref 1.4–7.7)
Neutrophils Relative %: 57 % (ref 43.0–77.0)
Platelets: 193 10*3/uL (ref 150.0–400.0)
RBC: 5.21 Mil/uL — ABNORMAL HIGH (ref 3.87–5.11)
RDW: 15.8 % — ABNORMAL HIGH (ref 11.5–15.5)
WBC: 7.3 10*3/uL (ref 4.0–10.5)

## 2018-12-21 LAB — MICROALBUMIN / CREATININE URINE RATIO
Creatinine,U: 90.6 mg/dL
Microalb Creat Ratio: 19.9 mg/g (ref 0.0–30.0)
Microalb, Ur: 18 mg/dL — ABNORMAL HIGH (ref 0.0–1.9)

## 2018-12-21 LAB — HEPATIC FUNCTION PANEL
ALT: 10 U/L (ref 0–35)
AST: 13 U/L (ref 0–37)
Albumin: 4.1 g/dL (ref 3.5–5.2)
Alkaline Phosphatase: 92 U/L (ref 39–117)
Bilirubin, Direct: 0.1 mg/dL (ref 0.0–0.3)
Total Bilirubin: 0.4 mg/dL (ref 0.2–1.2)
Total Protein: 6.8 g/dL (ref 6.0–8.3)

## 2018-12-21 LAB — HEMOGLOBIN A1C: Hgb A1c MFr Bld: 8.4 % — ABNORMAL HIGH (ref 4.6–6.5)

## 2018-12-21 LAB — TSH: TSH: 1.19 u[IU]/mL (ref 0.35–4.50)

## 2018-12-21 MED ORDER — FUROSEMIDE 20 MG PO TABS: ORAL_TABLET | ORAL | 5 refills | Status: DC

## 2018-12-21 MED ORDER — AMLODIPINE BESYLATE 5 MG PO TABS: 5.0000 mg | ORAL_TABLET | Freq: Every day | ORAL | 3 refills | Status: DC

## 2018-12-21 NOTE — Progress Notes (Addendum)
Subjective:    Patient ID: Brenda Moreno, female    DOB: [birth year], 73 y.o.   MRN: [id number]  HPI  Here for wellness and establish as new pt;  Overall doing ok;  Pt denies Chest pain, worsening SOB, DOE, wheezing, orthopnea, PND, worsening LE edema, palpitations, dizziness or syncope.  Pt denies neurological change such as new headache, facial or extremity weakness.  Pt denies polydipsia, polyuria, or low sugar symptoms. Pt states overall good compliance with treatment and medications, good tolerability, and has been trying to follow appropriate diet.  Pt denies worsening depressive symptoms, suicidal ideation or panic. No fever, night sweats, wt loss, loss of appetite, or other constitutional symptoms.  Pt states good ability with ADL's, has low fall risk, home safety reviewed and adequate, no other significant changes in hearing or vision, and only occasionally active with exercise.  Not taking the lasix as she feels was overmedicated, and swelling or sob has not recurred, essentially stable since her hospn dec 2019.  She admits to taking 15 units at bedtime insulin if she misses her AM shot, instead of 15 am, 7 pm.  She has had several low sugars doing this less than 100.   Mar 27 had episode with spots in eye now better, has appt with retinal specialist with ? Floaters.  Mentions she thinks she has been overmedicated at times with frequent urination on top of IBS and hard to leave the house.  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 Past Medical History:  Diagnosis Date  . Arthritis   . CAD (coronary artery disease)    S/P cabg  . Carotid artery occlusion   . Cataract   . CHF (congestive heart failure) ([place])   . CKD (chronic kidney disease) 12/21/2018  . DDD (degenerative disc disease), lumbar   . Diabetes mellitus   .  Hyperlipidemia   . Hypertension   . Joint pain   . Leg pain   . Myocardial infarction ([place]) 1998  . Peripheral vascular disease ([place])   . PVD (peripheral vascular disease) ([place])   . Stroke [facility])    Past Surgical History:  Procedure Laterality Date  . APPENDECTOMY    . BREAST EXCISIONAL BIOPSY Left 2008  . CHOLECYSTECTOMY     Gall bladder  . COLONOSCOPY  2008  . CORONARY ARTERY BYPASS GRAFT     1998  . LEFT HEART CATH AND CORS/GRAFTS ANGIOGRAPHY N/A 12/22/2017   Procedure: LEFT HEART CATH AND CORS/GRAFTS ANGIOGRAPHY;  Surgeon: Burnell Blanks, MD;  Location: [place] [facility];  Service: Cardiovascular;  Laterality: N/A;  . PR VEIN BYPASS GRAFT,AORTO-FEM-POP    . TEE WITHOUT CARDIOVERSION N/A 01/19/2018   Procedure: TRANSESOPHAGEAL ECHOCARDIOGRAM (TEE);  Surgeon: Josue Hector, MD;  Location: [facility] ENDOSCOPY;  Service: Cardiovascular;  Laterality: N/A;  . TUBAL LIGATION      reports that she quit smoking about 7 years ago. Her smoking use included cigarettes. She smoked 0.25 packs per day. She has never used smokeless tobacco. She reports that she does not drink alcohol or use drugs. family history includes Coronary artery disease in an other family member; Diabetes in her brother, brother, father, mother, and sister; Heart attack in her sister; Heart disease in her father, mother, and sister; Heart disease (age of onset: 48) in her brother; Hyperlipidemia in her brother,  father, and mother; Hypertension in her brother, father, mother, and sister; Other in her sister; Stroke in her mother. Allergies  Allergen Reactions  . Levaquin [Levofloxacin In D5w] Other (See Comments)    Pain all over and in joints  . Statins Other (See Comments)    Severe myalgias to lipitor, crestor, pravastatin and weakness and cramping  in bilateral leg.  [name] [Linagliptin] Itching   Current Outpatient Medications on File Prior to Visit  Medication Sig Dispense Refill  . CINNAMON PO Take 1,000  mg by mouth 2 (two) times daily.      . clopidogrel (PLAVIX) 75 MG tablet TAKE 1 TABLET BY MOUTH EVERY DAY 90 tablet 3  . clotrimazole-betamethasone (LOTRISONE) cream Apply 1 application topically 2 (two) times daily. 30 g 0  . Coenzyme Q10 (CO Q 10 PO) Take 100 mg by mouth daily.      Marland Kitchen doxazosin (CARDURA) 2 MG tablet Take 1 tablet (2 mg total) by mouth daily. 30 tablet 2  . fluticasone (FLONASE) 50 MCG/ACT nasal spray Place 2 sprays into both nostrils daily as needed for allergies.    . Insulin Isophane & Regular Human (HUMULIN 70/30 KWIKPEN) (70-30) 100 UNIT/ML PEN 15 units with breakfast and 7 units with supper (Patient taking differently: Inject 7-15 Units into the skin See admin instructions. Take 15 units in the morning and 7 units at night) 15 mL 11  . LIVALO 4 MG TABS TAKE 1 TABLET BY MOUTH EVERY DAY 90 tablet 1  . metFORMIN (GLUCOPHAGE) 1000 MG tablet TAKE 1 TABLET BY MOUTH  TWICE A DAY WITH MEALS 180 tablet 1  . nitroGLYCERIN (NITROSTAT) 0.4 MG SL tablet Place 1 tablet (0.4 mg total) under the tongue every 5 (five) minutes as needed for chest pain. 90 tablet 2  . quinapril (ACCUPRIL) 40 MG tablet TAKE 1 TABLET BY MOUTH TWO  TIMES DAILY (Patient taking differently: Take 40 mg by mouth 2 (two) times daily. ) 180 tablet 3  . traMADol (ULTRAM) 50 MG tablet Take 1 tablet (50 mg total) by mouth every 8 (eight) hours as needed. (Patient taking differently: Take 50 mg by mouth every 8 (eight) hours as needed for moderate pain. ) 180 tablet 0   No current facility-administered medications on file prior to visit.    Review of Systems Constitutional: Negative for other unusual diaphoresis, sweats, appetite or weight changes HENT: Negative for other worsening hearing loss, ear pain, facial swelling, mouth sores or neck stiffness.   Eyes: Negative for other worsening pain, redness or other visual disturbance.  Respiratory: Negative for other stridor or swelling Cardiovascular: Negative for other  palpitations or other chest pain  Gastrointestinal: Negative for worsening diarrhea or loose stools, blood in stool, distention or other pain Genitourinary: Negative for hematuria, flank pain or other change in urine volume.  Musculoskeletal: Negative for myalgias or other joint swelling.  Skin: Negative for other color change, or other wound or worsening drainage.  Neurological: Negative for other syncope or numbness. Hematological: Negative for other adenopathy or swelling Psychiatric/Behavioral: Negative for hallucinations, other worsening agitation, SI, self-injury, or new decreased concentration All other system neg per pt    Objective:   Physical Exam BP (!) 142/86   Pulse 65   Temp 97.9 F (36.6 C) (Oral)   Ht 5\' 4"  (1.626 m)   Wt 165 lb (74.8 kg)   SpO2 98%   BMI 28.32 kg/m  VS noted,  Constitutional: Pt is oriented to person, place, and time.  Appears well-developed and well-nourished, in no significant distress and comfortable Head: Normocephalic and atraumatic  Eyes: Conjunctivae and EOM are normal. Pupils are equal, round, and reactive to light Right Ear: External ear normal without discharge Left Ear: External ear normal without discharge Nose: Nose without discharge or deformity Mouth/Throat: Oropharynx is without other ulcerations and moist  Neck: Normal range of motion. Neck supple. No JVD present. No tracheal deviation present or significant neck LA or mass Cardiovascular: Normal rate, regular rhythm, normal heart sounds and intact distal pulses.   Pulmonary/Chest: WOB normal and breath sounds without rales or wheezing  Abdominal: Soft. Bowel sounds are normal. NT. No HSM  Musculoskeletal: Normal range of motion. Exhibits no edema Lymphadenopathy: Has no other cervical adenopathy.  Neurological: Pt is alert and oriented to person, place, and time. Pt has normal reflexes. No cranial nerve deficit. Motor grossly intact, Gait intact Skin: Skin is warm and dry. No rash  noted or new ulcerations Psychiatric:  Has normal mood and affect. Behavior is normal without agitation No other exam findings  Lab Results  Component Value Date   WBC 8.3 08/14/2018   HGB 12.6 08/14/2018   HCT 39.3 08/14/2018   PLT 275 08/14/2018   GLUCOSE 150 (H) 10/12/2018   CHOL 166 08/14/2018   TRIG 83 08/14/2018   HDL 50 (L) 08/14/2018   LDLCALC 99 08/14/2018   ALT 11 09/27/2018   AST 13 09/27/2018   NA 144 10/12/2018   K 4.5 10/12/2018   CL 110 10/12/2018   CREATININE 1.23 (H) 10/12/2018   BUN 18 10/12/2018   CO2 24 10/12/2018   TSH 2.531 08/11/2018   INR 0.88 01/16/2018   HGBA1C 7.8 (H) 08/14/2018   MICROALBUR 3.1 (H) 01/15/2015       Assessment & Plan:

## 2018-12-21 NOTE — Assessment & Plan Note (Signed)
To continue tramadol prn,  to f/u any worsening symptoms or concerns

## 2018-12-21 NOTE — Assessment & Plan Note (Signed)

## 2018-12-21 NOTE — Assessment & Plan Note (Addendum)
Stable volume, to take the lasix 20 mg prn swelling or wt gain 3-5 lbs  In addition to the time spent performing CPE, I spent an additional 25 minutes face to face,in which greater than 50% of this time was spent in counseling and coordination of care for patient's acute illness as documented, including the differential dx, treatment, further evaluation and other management of congestive heart failure, left sciatica, HTN, DM

## 2018-12-21 NOTE — Assessment & Plan Note (Signed)
stable overall by history and exam, recent data reviewed with pt, and pt to continue medical treatment as before,  to f/u any worsening symptoms or concerns, for f/u lab 

## 2018-12-21 NOTE — Assessment & Plan Note (Signed)
Advised pt to take insulin only as prescribed, to avoid low sugars

## 2018-12-21 NOTE — Patient Instructions (Addendum)
OK to restart the lasix at 20 mg per day as needed for swelling or weight increase by 3-5 lbs  OK to restart the amlodipine 5 mg for blood pressure  Please take the insulin only as prescribed  Please continue all other medications as before, and refills have been done if requested.  Please have the pharmacy call with any other refills you may need.  Please continue your efforts at being more active, low cholesterol diet, and weight control.  You are otherwise up to date with prevention measures today.  Please keep your appointments with your specialists as you may have planned  Please go to the LAB in the Basement (turn left off the elevator) for the tests to be done today  You will be contacted by phone if any changes need to be made immediately.  Otherwise, you will receive a letter about your results with an explanation, but please check with MyChart first.  Please remember to sign up for MyChart if you have not done so, as this will be important to you in the future with finding out test results, communicating by private email, and scheduling acute appointments online when needed.  Please return in 6 months, or sooner if needed, with Lab testing done 3-5 days before

## 2018-12-24 ENCOUNTER — Encounter: Payer: Self-pay | Admitting: Internal Medicine

## 2018-12-24 ENCOUNTER — Other Ambulatory Visit: Payer: Self-pay | Admitting: Internal Medicine

## 2018-12-24 ENCOUNTER — Encounter (INDEPENDENT_AMBULATORY_CARE_PROVIDER_SITE_OTHER): Payer: Self-pay | Admitting: Ophthalmology

## 2018-12-24 ENCOUNTER — Other Ambulatory Visit: Payer: Self-pay

## 2018-12-24 ENCOUNTER — Ambulatory Visit (INDEPENDENT_AMBULATORY_CARE_PROVIDER_SITE_OTHER): Payer: Medicare Other | Admitting: Ophthalmology

## 2018-12-24 DIAGNOSIS — H43813 Vitreous degeneration, bilateral: Secondary | ICD-10-CM | POA: Diagnosis not present

## 2018-12-24 DIAGNOSIS — E113293 Type 2 diabetes mellitus with mild nonproliferative diabetic retinopathy without macular edema, bilateral: Secondary | ICD-10-CM | POA: Diagnosis not present

## 2018-12-24 DIAGNOSIS — H35033 Hypertensive retinopathy, bilateral: Secondary | ICD-10-CM | POA: Diagnosis not present

## 2018-12-24 DIAGNOSIS — I10 Essential (primary) hypertension: Secondary | ICD-10-CM | POA: Diagnosis not present

## 2018-12-24 DIAGNOSIS — H3581 Retinal edema: Secondary | ICD-10-CM | POA: Diagnosis not present

## 2018-12-24 DIAGNOSIS — H25813 Combined forms of age-related cataract, bilateral: Secondary | ICD-10-CM

## 2018-12-24 MED ORDER — GLIPIZIDE ER 2.5 MG PO TB24: 2.5000 mg | ORAL_TABLET | Freq: Every day | ORAL | 3 refills | Status: DC

## 2018-12-24 NOTE — Progress Notes (Addendum)
[facility] Note  12/24/2018     CHIEF COMPLAINT Patient presents for Retina Evaluation and Diabetic Eye Exam   HISTORY OF PRESENT ILLNESS: Brenda Moreno is a 73 y.o. female who presents to the clinic today for:   HPI    Retina Evaluation    In both eyes.  This started 3 weeks ago.  Associated Symptoms Floaters.  Negative for Blind Spot, Glare, Shoulder/Hip pain, Fatigue, Jaw Claudication, Photophobia, Distortion, Weight Loss, Scalp Tenderness, Redness, Flashes, Pain, Trauma and Fever.  Context:  distance vision, mid-range vision and near vision.  Treatments tried include artificial tears.  Response to treatment was no improvement.  I, the attending physician,  performed the HPI with the patient and updated documentation appropriately.          Diabetic Eye Exam    Vision is stable.  Associated Symptoms Negative for Glare, Blind Spot, Fatigue, Shoulder/Hip pain, Jaw Claudication, Photophobia, Distortion, Floaters, Redness, Scalp Tenderness, Weight Loss, Fever, Trauma, Pain and Flashes.  Diabetes characteristics include Type 2 and on insulin.  This started 45 years ago.  Blood sugar level is controlled.  Last Blood Glucose 94.  Last A1C 7.  I, the attending physician,  performed the HPI with the patient and updated documentation appropriately.          Comments    Referral of Alert Lundquist PA, Patient states on November 23 2018 she had a migraine that resulted in many floaters OS, floaters gradually faded to one floater os. Patient is DM2 x 45 yrs, BS 94 (12/23/2018) A1C 7.0(09/2018), Bs are controlled by insulin and metformin ,BS are stable per patient. Pt is on Plavix. Pt uses artifical tears PRN.          Last edited by Bernarda Caffey, MD on 12/24/2018 11:02 AM. (History)    pt states she saw Shirleen Schirmer at [facility], she states he saw some bleeding in her left eye and send her here, she states she went to see him for floaters in her left eye, she  states she had a mild stroke in March, she states she started seeing several floaters right after the stroke and they mostly went away except one is still lingering around, she states she is diabetic and hypertensive, she states she is a regular pt of Dr. Katy Fitch  Referring physician: Shirleen Schirmer, PA-C [address] [place], [place] [address]  HISTORICAL INFORMATION:   Selected notes from the MEDICAL RECORD NUMBER Referred by Shirleen Schirmer for concern of retinal heme OS LEE:  Ocular Hx- PMH-    CURRENT MEDICATIONS: No current outpatient medications on file. (Ophthalmic Drugs)   No current facility-administered medications for this visit.  (Ophthalmic Drugs)   Current Outpatient Medications (Other)  Medication Sig  . amLODipine (NORVASC) 5 MG tablet Take 1 tablet (5 mg total) by mouth daily.  Marland Kitchen CINNAMON PO Take 1,000 mg by mouth 2 (two) times daily.    . clopidogrel (PLAVIX) 75 MG tablet TAKE 1 TABLET BY MOUTH EVERY DAY  . clotrimazole-betamethasone (LOTRISONE) cream Apply 1 application topically 2 (two) times daily.  . Coenzyme Q10 (CO Q 10 PO) Take 100 mg by mouth daily.    Marland Kitchen doxazosin (CARDURA) 2 MG tablet Take 1 tablet (2 mg total) by mouth daily.  . fluticasone (FLONASE) 50 MCG/ACT nasal spray Place 2 sprays into both nostrils daily as needed for allergies.  . furosemide (LASIX) 20 MG tablet 1 tab by  mouth daily as needed for swelling  . Insulin Isophane & Regular Human (HUMULIN 70/30 KWIKPEN) (70-30) 100 UNIT/ML PEN 15 units with breakfast and 7 units with supper (Patient taking differently: Inject 7-15 Units into the skin See admin instructions. Take 15 units in the morning and 7 units at night)  . LIVALO 4 MG TABS TAKE 1 TABLET BY MOUTH EVERY DAY  . metFORMIN (GLUCOPHAGE) 1000 MG tablet TAKE 1 TABLET BY MOUTH  TWICE A DAY WITH MEALS  . nitroGLYCERIN (NITROSTAT) 0.4 MG SL tablet Place 1 tablet (0.4 mg total) under the tongue every 5 (five) minutes as needed for chest pain.   Marland Kitchen quinapril (ACCUPRIL) 40 MG tablet TAKE 1 TABLET BY MOUTH TWO  TIMES DAILY (Patient taking differently: Take 40 mg by mouth 2 (two) times daily. )  . traMADol (ULTRAM) 50 MG tablet Take 1 tablet (50 mg total) by mouth every 8 (eight) hours as needed. (Patient taking differently: Take 50 mg by mouth every 8 (eight) hours as needed for moderate pain. )   No current facility-administered medications for this visit.  (Other)      REVIEW OF SYSTEMS: ROS    Positive for: Endocrine, Eyes   Negative for: Constitutional, Gastrointestinal, Neurological, Skin, Genitourinary, Musculoskeletal, HENT, Cardiovascular, Respiratory, Psychiatric, Allergic/Imm, Heme/Lymph   Last edited by Zenovia Jordan, LPN on 3/87/5643  3:29 AM. (History)       ALLERGIES Allergies  Allergen Reactions  . Levaquin [Levofloxacin In D5w] Other (See Comments)    Pain all over and in joints  . Statins Other (See Comments)    Severe myalgias to lipitor, crestor, pravastatin and weakness and cramping  in bilateral leg.  [name] [Linagliptin] Itching    PAST MEDICAL HISTORY Past Medical History:  Diagnosis Date  . Arthritis   . CAD (coronary artery disease)    S/P cabg  . Carotid artery occlusion   . Cataract   . CHF (congestive heart failure) ([place])   . CKD (chronic kidney disease) 12/21/2018  . DDD (degenerative disc disease), lumbar   . Diabetes mellitus   . Hyperlipidemia   . Hypertension   . Joint pain   . Leg pain   . Myocardial infarction ([place]) 1998  . Peripheral vascular disease ([place])   . PVD (peripheral vascular disease) ([place])   . Stroke [facility])    Past Surgical History:  Procedure Laterality Date  . APPENDECTOMY    . BREAST EXCISIONAL BIOPSY Left 2008  . CHOLECYSTECTOMY     Gall bladder  . COLONOSCOPY  2008  . CORONARY ARTERY BYPASS GRAFT     1998  . LEFT HEART CATH AND CORS/GRAFTS ANGIOGRAPHY N/A 12/22/2017   Procedure: LEFT HEART CATH AND CORS/GRAFTS ANGIOGRAPHY;  Surgeon: Burnell Blanks, MD;  Location: [facility] CV LAB;  Service: Cardiovascular;  Laterality: N/A;  . PR VEIN BYPASS GRAFT,AORTO-FEM-POP    . TEE WITHOUT CARDIOVERSION N/A 01/19/2018   Procedure: TRANSESOPHAGEAL ECHOCARDIOGRAM (TEE);  Surgeon: Josue Hector, MD;  Location: [facility] ENDOSCOPY;  Service: Cardiovascular;  Laterality: N/A;  . TUBAL LIGATION      FAMILY HISTORY Family History  Problem Relation Age of Onset  . Heart disease Mother   . Diabetes Mother   . Hyperlipidemia Mother   . Hypertension Mother   . Stroke Mother   . Heart disease Father        Heart Disease before age 16  . Diabetes Father   . Hyperlipidemia Father   . Hypertension Father   .  Heart disease Sister        heart attack  . Diabetes Sister        Amputation  . Hypertension Sister   . Other Sister        history of amputation  . Heart attack Sister   . Diabetes Brother   . Hypertension Brother   . Heart disease Brother 84       Before age 46  . Hyperlipidemia Brother   . Diabetes Brother        scirrosis of liver  . Coronary artery disease Other   . Colon cancer Neg Hx   . Stomach cancer Neg Hx   . Esophageal cancer Neg Hx     SOCIAL HISTORY Social History   Tobacco Use  . Smoking status: Former Smoker    Packs/day: 0.25    Types: Cigarettes    Last attempt to quit: 09/13/2011    Years since quitting: 7.2  . Smokeless tobacco: Never Used  Substance Use Topics  . Alcohol use: No  . Drug use: No         OPHTHALMIC EXAM:  Base Eye Exam    Visual Acuity (Snellen - Linear)      Right Left   Dist cc 20/40 +1 20/50   Dist ph cc NI 20/20 -2   Correction:  Glasses       Tonometry (Tonopen, 8:44 AM)      Right Left   Pressure 14 15       Pupils      Dark Light Shape React APD   Right 4 3 Round Brisk None   Left 4 3 Round Brisk None       Visual Fields (Counting fingers)      Left Right    Full Full       Extraocular Movement      Right Left    Full, Ortho Full, Ortho        Neuro/Psych    Oriented x3:  Yes       Dilation    Both eyes:  1.0% Mydriacyl, 2.5% Phenylephrine @ 8:44 AM        Slit Lamp and Fundus Exam    Slit Lamp Exam      Right Left   Lids/Lashes Dermatochalasis - upper lid, mild Meibomian gland dysfunction Dermatochalasis - upper lid, mild Meibomian gland dysfunction   Conjunctiva/Sclera White and quiet conj cyst IT quad   Cornea mild Arcus, 1+ Punctate epithelial erosions mild Arcus, 1-2+ Punctate epithelial erosions   Anterior Chamber deep and clear deep and clear   Iris Round and dilated, No NVI Round and dilated, No NVI   Lens 2-3+ Nuclear sclerosis, 2+ Cortical cataract 2+ Nuclear sclerosis, 2+ Cortical cataract   Vitreous Vitreous syneresis, Posterior vitreous detachment Vitreous syneresis, Posterior vitreous detachment       Fundus Exam      Right Left   Disc Pink and Sharp mild tilt, Pink and Sharp   C/D Ratio 0.3 0.3   Macula Flat, Blunted foveal reflex, mild RPE mottling, scattered Microaneurysms, no edema Flat, Blunted foveal reflex, rare, scattered Microaneurysms, Retinal pigment epithelial mottling, no edema   Vessels Vascular attenuation Vascular attenuation, mild Tortuousity   Periphery Attached, no heme Attached, punctate heme far, temporal periphery          IMAGING AND PROCEDURES  Imaging and Procedures for @TODAY @  OCT, Retina - OU - Both Eyes       Right Eye  Quality was good. Central Foveal Thickness: 232. Progression has no prior data. Findings include normal foveal contour, no IRF, no SRF.   Left Eye Quality was good. Central Foveal Thickness: 223. Progression has no prior data. Findings include no SRF, normal foveal contour, no IRF (Trace cystic changes temporal macula).   Notes *Images captured and stored on drive  Diagnosis / Impression:  NFP, no IRF/SRF OU No DME OU  Clinical management:  See below  Abbreviations: NFP - Normal foveal profile. CME - cystoid macular edema. PED - pigment  epithelial detachment. IRF - intraretinal fluid. SRF - subretinal fluid. EZ - ellipsoid zone. ERM - epiretinal membrane. ORA - outer retinal atrophy. ORT - outer retinal tubulation. SRHM - subretinal hyper-reflective material        Fluorescein Angiography Optos (Transit OD)       Right Eye   Progression has no prior data. Early phase findings include delayed filling, microaneurysm. Mid/Late phase findings include microaneurysm, staining.   Left Eye   Progression has no prior data. Early phase findings include microaneurysm. Mid/Late phase findings include microaneurysm, leakage.   Notes **Images stored on drive**  Impression: OD: scattered MA; no significant leakage; no NV OS: clustered MA w/ leakage temporal macula; no NV  Mild NPDR OU                 ASSESSMENT/PLAN:    ICD-10-CM   1. Mild nonproliferative diabetic retinopathy of both eyes without macular edema associated with type 2 diabetes mellitus (HCC) P71.0626 Fluorescein Angiography Optos (Transit OD)  2. Retinal edema H35.81 OCT, Retina - OU - Both Eyes  3. Essential hypertension I10   4. Hypertensive retinopathy of both eyes H35.033 Fluorescein Angiography Optos (Transit OD)  5. Posterior vitreous detachment of both eyes H43.813   6. Combined forms of age-related cataract of both eyes H25.813     1,2. Mild nonproliferative diabetic retinopathy w/o DME, OU  - The incidence, risk factors for progression, natural history and treatment options for diabetic retinopathy were discussed with patient.    - The need for close monitoring of blood glucose, blood pressure, and serum lipids, avoiding cigarette or any type of tobacco, and the need for long term follow up was also discussed with patient.  - exam shows scattered MA OU   - FA (04.27.20) shows scattered MA OU; no NV OU; OS with cluster of MA w/ leakage temporal macula  - OCT without diabetic macular edema, OU -- OS w/ tr cystic changes corresponding to  MA cluster  - f/u in 3 mos  3,4. Hypertensive retinopathy OU  - discussed importance of tight BP control  - monitor  5. PVD / vitreous syneresis OU  Discussed findings and prognosis  No RT or RD on 360 peripheral exam  Reviewed s/s of RT/RD  Strict return precautions for any such RT/RD signs/symptoms  6. Mixed form age related cataract  - The symptoms of cataract, surgical options, and treatments and risks were discussed with patient.  - discussed diagnosis and progression  - not yet visually significant  - monitor for now   Ophthalmic Meds Ordered this visit:  No orders of the defined types were placed in this encounter.      Return for f/u 2-3 months NPDR OU, DFE, OCT.  There are no Patient Instructions on file for this visit.   Explained the diagnoses, plan, and follow up with the patient and they expressed understanding.  Patient expressed understanding of the importance of proper  follow up care.   This document serves as a record of services personally performed by Gardiner Sleeper, MD, PhD. It was created on their behalf by Ernest Mallick, OA, an ophthalmic assistant. The creation of this record is the provider's dictation and/or activities during the visit.    Electronically signed by: Ernest Mallick, OA  04.27.2020 11:13 AM    Gardiner Sleeper, M.D., Ph.D. Diseases & Surgery of the Retina and Vitreous [facility]  I have reviewed the above documentation for accuracy and completeness, and I agree with the above. Gardiner Sleeper, M.D., Ph.D. 12/24/18 11:13 AM      Abbreviations: M myopia (nearsighted); A astigmatism; H hyperopia (farsighted); P presbyopia; Mrx spectacle prescription;  CTL contact lenses; OD right eye; OS left eye; OU both eyes  XT exotropia; ET esotropia; PEK punctate epithelial keratitis; PEE punctate epithelial erosions; DES dry eye syndrome; MGD meibomian gland dysfunction; ATs artificial tears; PFAT's preservative free  artificial tears; [place] nuclear sclerotic cataract; PSC posterior subcapsular cataract; ERM epi-retinal membrane; PVD posterior vitreous detachment; RD retinal detachment; DM diabetes mellitus; DR diabetic retinopathy; NPDR non-proliferative diabetic retinopathy; PDR proliferative diabetic retinopathy; CSME clinically significant macular edema; DME diabetic macular edema; dbh dot blot hemorrhages; CWS cotton wool spot; POAG primary open angle glaucoma; C/D cup-to-disc ratio; HVF humphrey visual field; GVF goldmann visual field; OCT optical coherence tomography; IOP intraocular pressure; BRVO Branch retinal vein occlusion; CRVO central retinal vein occlusion; CRAO central retinal artery occlusion; BRAO branch retinal artery occlusion; RT retinal tear; SB scleral buckle; PPV pars plana vitrectomy; VH Vitreous hemorrhage; PRP panretinal laser photocoagulation; IVK intravitreal kenalog; VMT vitreomacular traction; MH Macular hole;  NVD neovascularization of the disc; NVE neovascularization elsewhere; AREDS age related eye disease study; ARMD age related macular degeneration; POAG primary open angle glaucoma; EBMD epithelial/anterior basement membrane dystrophy; ACIOL anterior chamber intraocular lens; IOL intraocular lens; PCIOL posterior chamber intraocular lens; Phaco/IOL phacoemulsification with intraocular lens placement; [place] photorefractive keratectomy; LASIK laser assisted in situ keratomileusis; HTN hypertension; DM diabetes mellitus; COPD chronic obstructive pulmonary disease

## 2018-12-25 ENCOUNTER — Telehealth: Payer: Self-pay

## 2018-12-25 NOTE — Telephone Encounter (Signed)
-----   Message from Biagio Borg, MD sent at 12/24/2018  5:08 PM EDT ----- Left message on MyChart, pt to cont same tx except  The test results show that your current treatment is OK, except the A1c is mildly too high.  We should add a medication called Glipizide ER 2.5 mg at one per day, in addition to the other medications. This will help control the sugar, which can be rechecked again at your next visit.  I will send the prescription, and you should hear from the office as well.Brenda Moreno to please inform pt, I will do rx

## 2018-12-25 NOTE — Telephone Encounter (Signed)
Attempted to contact pt, VM is not set up to leave a message.   CRM created.

## 2018-12-26 ENCOUNTER — Ambulatory Visit: Payer: Medicare Other | Admitting: Adult Health

## 2018-12-31 ENCOUNTER — Other Ambulatory Visit: Payer: Self-pay | Admitting: Internal Medicine

## 2018-12-31 ENCOUNTER — Telehealth: Payer: Self-pay | Admitting: Internal Medicine

## 2018-12-31 MED ORDER — FUROSEMIDE 20 MG PO TABS: 20.0000 mg | ORAL_TABLET | Freq: Every day | ORAL | 3 refills | Status: DC | PRN

## 2018-12-31 MED ORDER — AMLODIPINE BESYLATE 5 MG PO TABS: 5.0000 mg | ORAL_TABLET | Freq: Every day | ORAL | 1 refills | Status: DC

## 2018-12-31 NOTE — Telephone Encounter (Signed)
Sent amlodipine & furosemide. Per last visit pt is only suppose to be taking insulin did not refill generic actos./lmb

## 2018-12-31 NOTE — Telephone Encounter (Signed)
Copied from [place] [phone]. Topic: Quick Communication - Rx Refill/Question >> Dec 31, 2018  8:58 AM Carolyn Stare wrote: Medication:  amLODipine (NORVASC) 5 MG tablet               furosemide (LASIX) 20 MG tablet                               Pioglitazone 30 mg   [facility] [address]   Agent: Please be advised that RX refills may take up to 3 business days. We ask that you follow-up with your pharmacy.

## 2018-12-31 NOTE — Addendum Note (Signed)
Addended by: Earnstine Regal on: 12/31/2018 11:01 AM   Modules accepted: Orders

## 2019-01-03 LAB — HM DIABETES EYE EXAM

## 2019-02-01 ENCOUNTER — Other Ambulatory Visit: Payer: Self-pay | Admitting: Family Medicine

## 2019-02-11 ENCOUNTER — Telehealth: Payer: Self-pay | Admitting: Internal Medicine

## 2019-02-11 MED ORDER — QUINAPRIL HCL 40 MG PO TABS: 40.0000 mg | ORAL_TABLET | Freq: Two times a day (BID) | ORAL | 3 refills | Status: DC

## 2019-02-11 NOTE — Telephone Encounter (Signed)
Refill sent. See meds.  

## 2019-02-11 NOTE — Telephone Encounter (Signed)
Copied from [place] [phone]. Topic: Quick Communication - Rx Refill/Question >> Feb 11, 2019 11:47 AM Rainey Pines A wrote: Medication: quinapril (ACCUPRIL) 40 MG tablet ,Actos  Has the patient contacted their pharmacy? Yes (Agent: If no, request that the patient contact the pharmacy for the refill.) (Agent: If yes, when and what did the pharmacy advise?)Contact PCP  Preferred Pharmacy (with phone number or street name): [id number] [place], [place] - [address] [phone] (Phone) [phone] (Fax)    Agent: Please be advised that RX refills may take up to 3 business days. We ask that you follow-up with your pharmacy.

## 2019-02-22 ENCOUNTER — Telehealth: Payer: Self-pay

## 2019-02-22 NOTE — Progress Notes (Addendum)
[facility] Note  02/25/2019     CHIEF COMPLAINT Patient presents for Retina Follow Up   HISTORY OF PRESENT ILLNESS: Brenda Moreno is a 72 y.o. female who presents to the clinic today for:   HPI    Retina Follow Up    Patient presents with  Diabetic Retinopathy.  In both eyes.  This started 2 months ago.  Severity is moderate.  Duration of 2 months.  Since onset it is rapidly improving.  I, the attending physician,  performed the HPI with the patient and updated documentation appropriately.          Comments    73 y/o female pt here for  Mo f/u for mild NPDR OU.  No noticed change in [place] OU.  Still sees a small floater peripherally OS.  Denies pain, flashes.  BS 70 this a.m.  A1C 6 or 7.  AT and allergy gtts prn OU.       Last edited by Bernarda Caffey, MD on 02/25/2019 11:34 AM. (History)    pt states her vision is going pretty well, she states she sees floaters intermittently in the left eye only   Referring physician: Shirleen Schirmer, PA-C [address] [place], [place] [address]  HISTORICAL INFORMATION:   Selected notes from the MEDICAL RECORD NUMBER Referred by Shirleen Schirmer for concern of retinal heme OS LEE:  Ocular Hx- PMH-    CURRENT MEDICATIONS: No current outpatient medications on file. (Ophthalmic Drugs)   No current facility-administered medications for this visit.  (Ophthalmic Drugs)   Current Outpatient Medications (Other)  Medication Sig  . amLODipine (NORVASC) 5 MG tablet Take 1 tablet (5 mg total) by mouth daily.  Marland Kitchen CINNAMON PO Take 1,000 mg by mouth 2 (two) times daily.    . clopidogrel (PLAVIX) 75 MG tablet TAKE 1 TABLET BY MOUTH EVERY DAY  . clotrimazole-betamethasone (LOTRISONE) cream Apply 1 application topically 2 (two) times daily.  . Coenzyme Q10 (CO Q 10 PO) Take 100 mg by mouth daily.    Marland Kitchen doxazosin (CARDURA) 2 MG tablet TAKE 1 TABLET BY MOUTH EVERY DAY  . fluticasone (FLONASE) 50 MCG/ACT nasal spray Place  2 sprays into both nostrils daily as needed for allergies.  . furosemide (LASIX) 20 MG tablet Take 1 tablet (20 mg total) by mouth daily as needed for edema.  Marland Kitchen glipiZIDE (GLUCOTROL XL) 2.5 MG 24 hr tablet Take 1 tablet (2.5 mg total) by mouth daily with breakfast.  . Insulin Isophane & Regular Human (HUMULIN 70/30 KWIKPEN) (70-30) 100 UNIT/ML PEN 15 units with breakfast and 7 units with supper (Patient taking differently: Inject 7-15 Units into the skin See admin instructions. Take 15 units in the morning and 7 units at night)  . JARDIANCE 25 MG TABS tablet Take 25 mg by mouth daily.  Marland Kitchen LIVALO 4 MG TABS TAKE 1 TABLET BY MOUTH EVERY DAY  . metFORMIN (GLUCOPHAGE) 1000 MG tablet TAKE 1 TABLET BY MOUTH  TWICE A DAY WITH MEALS  . nitroGLYCERIN (NITROSTAT) 0.4 MG SL tablet Place 1 tablet (0.4 mg total) under the tongue every 5 (five) minutes as needed for chest pain.  Marland Kitchen quinapril (ACCUPRIL) 40 MG tablet Take 1 tablet (40 mg total) by mouth 2 (two) times daily.  . traMADol (ULTRAM) 50 MG tablet Take 1 tablet (50 mg total) by mouth every 8 (eight) hours as needed. (Patient taking differently: Take 50 mg by mouth every 8 (eight) hours as  needed for moderate pain. )   No current facility-administered medications for this visit.  (Other)      REVIEW OF SYSTEMS: ROS    Positive for: Genitourinary, Musculoskeletal, Endocrine, Cardiovascular, Eyes   Negative for: Constitutional, Gastrointestinal, Neurological, Skin, HENT, Respiratory, Psychiatric, Allergic/Imm, Heme/Lymph   Last edited by Matthew Folks, COA on 02/25/2019 10:16 AM. (History)       ALLERGIES Allergies  Allergen Reactions  . Levaquin [Levofloxacin In D5w] Other (See Comments)    Pain all over and in joints  . Statins Other (See Comments)    Severe myalgias to lipitor, crestor, pravastatin and weakness and cramping  in bilateral leg.  [name] [Linagliptin] Itching    PAST MEDICAL HISTORY Past Medical History:  Diagnosis  Date  . Arthritis   . CAD (coronary artery disease)    S/P cabg  . Carotid artery occlusion   . Cataract   . CHF (congestive heart failure) ([place])   . CKD (chronic kidney disease) 12/21/2018  . DDD (degenerative disc disease), lumbar   . Diabetes mellitus   . Diabetic retinopathy ([place])    NPDR OU  . Hyperlipidemia   . Hypertension   . Hypertensive retinopathy    OU  . Joint pain   . Leg pain   . Myocardial infarction ([place]) 1998  . Peripheral vascular disease ([place])   . PVD (peripheral vascular disease) ([place])   . Stroke [facility])    Past Surgical History:  Procedure Laterality Date  . APPENDECTOMY    . BREAST EXCISIONAL BIOPSY Left 2008  . CHOLECYSTECTOMY     Gall bladder  . COLONOSCOPY  2008  . CORONARY ARTERY BYPASS GRAFT     1998  . LEFT HEART CATH AND CORS/GRAFTS ANGIOGRAPHY N/A 12/22/2017   Procedure: LEFT HEART CATH AND CORS/GRAFTS ANGIOGRAPHY;  Surgeon: Burnell Blanks, MD;  Location: [facility];  Service: Cardiovascular;  Laterality: N/A;  . PR VEIN BYPASS GRAFT,AORTO-FEM-POP    . TEE WITHOUT CARDIOVERSION N/A 01/19/2018   Procedure: TRANSESOPHAGEAL ECHOCARDIOGRAM (TEE);  Surgeon: Josue Hector, MD;  Location: [facility] ENDOSCOPY;  Service: Cardiovascular;  Laterality: N/A;  . TUBAL LIGATION      FAMILY HISTORY Family History  Problem Relation Age of Onset  . Heart disease Mother   . Diabetes Mother   . Hyperlipidemia Mother   . Hypertension Mother   . Stroke Mother   . Heart disease Father        Heart Disease before age 32  . Diabetes Father   . Hyperlipidemia Father   . Hypertension Father   . Heart disease Sister        heart attack  . Diabetes Sister        Amputation  . Hypertension Sister   . Other Sister        history of amputation  . Heart attack Sister   . Diabetes Brother   . Hypertension Brother   . Heart disease Brother 62       Before age 55  . Hyperlipidemia Brother   . Diabetes Brother        scirrosis of liver  . Coronary  artery disease Other   . Colon cancer Neg Hx   . Stomach cancer Neg Hx   . Esophageal cancer Neg Hx     SOCIAL HISTORY Social History   Tobacco Use  . Smoking status: Former Smoker    Packs/day: 0.25    Types: Cigarettes    Quit date:  09/13/2011    Years since quitting: 7.4  . Smokeless tobacco: Never Used  Substance Use Topics  . Alcohol use: No  . Drug use: No         OPHTHALMIC EXAM:  Base Eye Exam    Visual Acuity (Snellen - Linear)      Right Left   Dist cc 20/20 -2 20/30 +2   Dist ph cc  20/20 -2   Correction: Glasses       Tonometry (Tonopen, 10:20 AM)      Right Left   Pressure 14 13       Pupils      Dark Light Shape React APD   Right 4 3 Round Brisk None   Left 4 3 Round Brisk None       Visual Fields (Counting fingers)      Left Right    Full Full       Extraocular Movement      Right Left    Full, Ortho Full, Ortho       Neuro/Psych    Oriented x3: Yes   Mood/Affect: Normal       Dilation    Both eyes: 1.0% Mydriacyl, 2.5% Phenylephrine @ 10:20 AM        Slit Lamp and Fundus Exam    Slit Lamp Exam      Right Left   Lids/Lashes Dermatochalasis - upper lid, mild Meibomian gland dysfunction Dermatochalasis - upper lid, mild Meibomian gland dysfunction   Conjunctiva/Sclera White and quiet conj cyst IT quad   Cornea mild Arcus, 1+ Punctate epithelial erosions mild Arcus, 1+ Punctate epithelial erosions   Anterior Chamber deep and clear deep and clear   Iris Round and dilated, No NVI Round and dilated, No NVI   Lens 2-3+ Nuclear sclerosis, 2+ Cortical cataract 2-3+ Nuclear sclerosis, 2+ Cortical cataract   Vitreous Vitreous syneresis, Posterior vitreous detachment Vitreous syneresis, Posterior vitreous detachment       Fundus Exam      Right Left   Disc Pink and Sharp, mild tilt, temporal PPA mild tilt, Pink and Sharp, Compact   C/D Ratio 0.2 0.3   Macula Flat, Blunted foveal reflex, mild RPE mottling, scattered Microaneurysms -  slightly improved, no edema Flat, Blunted foveal reflex, rare, scattered Microaneurysms, Retinal pigment epithelial mottling, no edema   Vessels mild Vascular attenuation, mild tortuosity Vascular attenuation, mild Tortuousity   Periphery Attached, no heme Attached, punctate heme far, temporal periphery - resolved, rare MA          IMAGING AND PROCEDURES  Imaging and Procedures for @TODAY @  OCT, Retina - OU - Both Eyes       Right Eye Quality was good. Central Foveal Thickness: 223. Progression has been stable. Findings include normal foveal contour, no IRF, no SRF.   Left Eye Quality was good. Central Foveal Thickness: 222. Progression has improved. Findings include no SRF, normal foveal contour, no IRF (Trace cystic changes temporal macula - improved).   Notes *Images captured and stored on drive  Diagnosis / Impression:  NFP, no IRF/SRF OU No DME OU  Clinical management:  See below  Abbreviations: NFP - Normal foveal profile. CME - cystoid macular edema. PED - pigment epithelial detachment. IRF - intraretinal fluid. SRF - subretinal fluid. EZ - ellipsoid zone. ERM - epiretinal membrane. ORA - outer retinal atrophy. ORT - outer retinal tubulation. SRHM - subretinal hyper-reflective material  ASSESSMENT/PLAN:    ICD-10-CM   1. Mild nonproliferative diabetic retinopathy of both eyes without macular edema associated with type 2 diabetes mellitus ([place])  I62.7035   2. Retinal edema  H35.81 OCT, Retina - OU - Both Eyes  3. Essential hypertension  I10   4. Hypertensive retinopathy of both eyes  H35.033   5. Posterior vitreous detachment of both eyes  H43.813   6. Combined forms of age-related cataract of both eyes  H25.813     1,2. Mild nonproliferative diabetic retinopathy w/o DME, OU  - exam shows scattered MA OU   - FA (04.27.20) shows scattered MA OU; no NV OU; OS with cluster of MA w/ leakage temporal macula  - OCT without diabetic macular edema,  OU -- OS w/ interval improvement in tr cystic changes  - f/u in 4-6 mos  3,4. Hypertensive retinopathy OU  - discussed importance of tight BP control  - monitor  5. PVD / vitreous syneresis OU  Discussed findings and prognosis  No RT or RD on 360 peripheral exam  Reviewed s/s of RT/RD  Strict return precautions for any such RT/RD signs/symptoms  6. Mixed form age related cataract  - The symptoms of cataract, surgical options, and treatments and risks were discussed with patient.  - discussed diagnosis and progression  - not yet visually significant  - monitor for now   Ophthalmic Meds Ordered this visit:  No orders of the defined types were placed in this encounter.      Return for f/u 4-6 months NPDR, DFE, OCT.  There are no Patient Instructions on file for this visit.   Explained the diagnoses, plan, and follow up with the patient and they expressed understanding.  Patient expressed understanding of the importance of proper follow up care.   This document serves as a record of services personally performed by Gardiner Sleeper, MD, PhD. It was created on their behalf by Ernest Mallick, OA, an ophthalmic assistant. The creation of this record is the provider's dictation and/or activities during the visit.    Electronically signed by: Ernest Mallick, OA  06.26.2020 5:07 PM   Gardiner Sleeper, M.D., Ph.D. Diseases & Surgery of the Retina and Vitreous [facility]  I have reviewed the above documentation for accuracy and completeness, and I agree with the above. Gardiner Sleeper, M.D., Ph.D. 02/26/19 5:07 PM   Abbreviations: M myopia (nearsighted); A astigmatism; H hyperopia (farsighted); P presbyopia; Mrx spectacle prescription;  CTL contact lenses; OD right eye; OS left eye; OU both eyes  XT exotropia; ET esotropia; PEK punctate epithelial keratitis; PEE punctate epithelial erosions; DES dry eye syndrome; MGD meibomian gland dysfunction; ATs artificial  tears; PFAT's preservative free artificial tears; [place] nuclear sclerotic cataract; PSC posterior subcapsular cataract; ERM epi-retinal membrane; PVD posterior vitreous detachment; RD retinal detachment; DM diabetes mellitus; DR diabetic retinopathy; NPDR non-proliferative diabetic retinopathy; PDR proliferative diabetic retinopathy; CSME clinically significant macular edema; DME diabetic macular edema; dbh dot blot hemorrhages; CWS cotton wool spot; POAG primary open angle glaucoma; C/D cup-to-disc ratio; HVF humphrey visual field; GVF goldmann visual field; OCT optical coherence tomography; IOP intraocular pressure; BRVO Branch retinal vein occlusion; CRVO central retinal vein occlusion; CRAO central retinal artery occlusion; BRAO branch retinal artery occlusion; RT retinal tear; SB scleral buckle; PPV pars plana vitrectomy; VH Vitreous hemorrhage; PRP panretinal laser photocoagulation; IVK intravitreal kenalog; VMT vitreomacular traction; MH Macular hole;  NVD neovascularization of the disc; NVE neovascularization elsewhere; AREDS age related  eye disease study; ARMD age related macular degeneration; POAG primary open angle glaucoma; EBMD epithelial/anterior basement membrane dystrophy; ACIOL anterior chamber intraocular lens; IOL intraocular lens; PCIOL posterior chamber intraocular lens; Phaco/IOL phacoemulsification with intraocular lens placement; [place] photorefractive keratectomy; LASIK laser assisted in situ keratomileusis; HTN hypertension; DM diabetes mellitus; COPD chronic obstructive pulmonary disease

## 2019-02-25 ENCOUNTER — Encounter (INDEPENDENT_AMBULATORY_CARE_PROVIDER_SITE_OTHER): Payer: Self-pay | Admitting: Ophthalmology

## 2019-02-25 ENCOUNTER — Ambulatory Visit (INDEPENDENT_AMBULATORY_CARE_PROVIDER_SITE_OTHER): Payer: Medicare Other | Admitting: Ophthalmology

## 2019-02-25 ENCOUNTER — Other Ambulatory Visit: Payer: Self-pay

## 2019-02-25 DIAGNOSIS — E113293 Type 2 diabetes mellitus with mild nonproliferative diabetic retinopathy without macular edema, bilateral: Secondary | ICD-10-CM | POA: Diagnosis not present

## 2019-02-25 DIAGNOSIS — H43813 Vitreous degeneration, bilateral: Secondary | ICD-10-CM | POA: Diagnosis not present

## 2019-02-25 DIAGNOSIS — I10 Essential (primary) hypertension: Secondary | ICD-10-CM | POA: Diagnosis not present

## 2019-02-25 DIAGNOSIS — H25813 Combined forms of age-related cataract, bilateral: Secondary | ICD-10-CM

## 2019-02-25 DIAGNOSIS — H3581 Retinal edema: Secondary | ICD-10-CM | POA: Diagnosis not present

## 2019-02-25 DIAGNOSIS — H35033 Hypertensive retinopathy, bilateral: Secondary | ICD-10-CM | POA: Diagnosis not present

## 2019-02-27 ENCOUNTER — Other Ambulatory Visit: Payer: Self-pay | Admitting: Family Medicine

## 2019-03-07 ENCOUNTER — Other Ambulatory Visit: Payer: Self-pay | Admitting: *Deleted

## 2019-03-07 NOTE — Patient Outreach (Signed)
[employer] High Risk Referral. Call [name] and she did not answer but I was able to leave a message and I have asked her to return my call.   She has recently called out office and requested to be involved with our program.  I will call her again tomorrow.  Brenda Moreno. Brenda Neither, MSN, [facility] Gerontological Nurse Practitioner [facility] Care Management [phone]

## 2019-03-11 ENCOUNTER — Other Ambulatory Visit: Payer: Self-pay | Admitting: *Deleted

## 2019-03-11 NOTE — Patient Outreach (Signed)
[employer] High Risk Referral second outreach. I was able to talk with Brenda Moreno for a few moments, however, she was preparing to go to an appt. She did say she does want to participate with [place]. She recently had a CVA.  Have advised her that I will be asking for another care manager to be assigned in my absence for the next 3 weeks. She said she would look forward to another call from [facility].  Eulah Pont. Myrtie Neither, MSN, [facility] Gerontological Nurse Practitioner [facility] Care Management [phone]

## 2019-03-27 ENCOUNTER — Other Ambulatory Visit: Payer: Self-pay | Admitting: Internal Medicine

## 2019-03-27 ENCOUNTER — Other Ambulatory Visit: Payer: Self-pay

## 2019-03-27 NOTE — Patient Outreach (Signed)
[facility]) Care Management  03/27/2019  Brenda Moreno [birth year] [id number]   Medication Adherence call to [name] Compliant Voice message left with a call back number. [name] is showing past due on Livalo 4 mg under [place].   [place] Management Direct Dial [phone]  Fax [phone] [name]

## 2019-03-28 ENCOUNTER — Other Ambulatory Visit: Payer: Self-pay

## 2019-03-28 ENCOUNTER — Other Ambulatory Visit: Payer: Self-pay | Admitting: Family Medicine

## 2019-03-28 NOTE — Patient Outreach (Signed)
[place] [facility]) Care Management  03/28/2019  Brenda Moreno 12-29-1945 [id number]   Referral transferred from previously assigned CM for complex case management.  Unsuccessful outreach attempt. Will mail unsuccessful outreach letter and follow-up within 3-4 business days.     [place] [phone]

## 2019-04-02 ENCOUNTER — Other Ambulatory Visit: Payer: Self-pay

## 2019-04-02 ENCOUNTER — Telehealth: Payer: Self-pay | Admitting: Internal Medicine

## 2019-04-02 NOTE — Telephone Encounter (Signed)
error 

## 2019-04-02 NOTE — Patient Outreach (Addendum)
[facility] [facility]) Care Management  04/02/2019  Brenda Moreno 1945-10-16 [id number]     2nd unsuccessful outreach attempt. Will attempt outreach within 3-4 business days.    [place] [phone]

## 2019-04-08 ENCOUNTER — Other Ambulatory Visit: Payer: Self-pay

## 2019-04-08 NOTE — Patient Outreach (Signed)
[facility]) Care Management  04/08/2019  Brenda Moreno 06-Apr-1946 [id number]    3rd unsuccessful outreach attempt. Referral was transferred from previously assigned Care Manager. Case closure pending.     [place] [phone]

## 2019-04-12 ENCOUNTER — Telehealth: Payer: Self-pay

## 2019-04-12 NOTE — Telephone Encounter (Signed)
[place]. Will not be faxing anything back.   Copied from [place] [id number]. Topic: General - Other >> Apr 05, 2019 10:45 AM Burchel, Abbi R wrote: Reason for CRM: Abby with [employer] called to verify fax sent on 04/02/2019 re: supply orders for pt.  Please call: [phone] to confirm fax and give orders. >> Apr 11, 2019  2:44 PM Virl Axe D wrote: [facility] [employer] called to see if fax was received. Requesting callback. [phone]

## 2019-04-16 ENCOUNTER — Telehealth: Payer: Self-pay | Admitting: Internal Medicine

## 2019-04-16 ENCOUNTER — Encounter (HOSPITAL_COMMUNITY): Payer: Self-pay

## 2019-04-16 ENCOUNTER — Ambulatory Visit (HOSPITAL_COMMUNITY)
Admission: EM | Admit: 2019-04-16 | Discharge: 2019-04-16 | Disposition: A | Discharge status: Home / Self Care | Payer: Medicare Other | Attending: Family Medicine | Admitting: Family Medicine

## 2019-04-16 ENCOUNTER — Ambulatory Visit (INDEPENDENT_AMBULATORY_CARE_PROVIDER_SITE_OTHER): Payer: Medicare Other

## 2019-04-16 DIAGNOSIS — S299XXA Unspecified injury of thorax, initial encounter: Secondary | ICD-10-CM | POA: Diagnosis not present

## 2019-04-16 DIAGNOSIS — R0789 Other chest pain: Secondary | ICD-10-CM | POA: Diagnosis not present

## 2019-04-16 DIAGNOSIS — M7918 Myalgia, other site: Secondary | ICD-10-CM

## 2019-04-16 DIAGNOSIS — W19XXXA Unspecified fall, initial encounter: Secondary | ICD-10-CM

## 2019-04-16 DIAGNOSIS — S29012A Strain of muscle and tendon of back wall of thorax, initial encounter: Secondary | ICD-10-CM

## 2019-04-16 DIAGNOSIS — W182XXA Fall in (into) shower or empty bathtub, initial encounter: Secondary | ICD-10-CM | POA: Diagnosis not present

## 2019-04-16 DIAGNOSIS — W2209XA Striking against other stationary object, initial encounter: Secondary | ICD-10-CM

## 2019-04-16 DIAGNOSIS — R0781 Pleurodynia: Secondary | ICD-10-CM | POA: Diagnosis not present

## 2019-04-16 MED ORDER — TRAMADOL HCL 50 MG PO TABS: 50.0000 mg | ORAL_TABLET | Freq: Four times a day (QID) | ORAL | 0 refills | Status: DC | PRN

## 2019-04-16 MED ORDER — CYCLOBENZAPRINE HCL 5 MG PO TABS: 5.0000 mg | ORAL_TABLET | Freq: Every day | ORAL | 0 refills | Status: AC

## 2019-04-16 NOTE — ED Triage Notes (Signed)
Pt states she fell in the bath trying to put on some oil. Pt states she has pain in her right side pain and shoulder pain , right breast pain, right thigh. Pt has upper back pain.

## 2019-04-16 NOTE — Telephone Encounter (Signed)
Copied from [place] [id number]. Topic: Quick Communication - Rx Refill/Question >> Apr 16, 2019 12:52 PM Erick Blinks wrote: Medication: Pt is in need of test strips, Accu-Check test strips.   Has the patient contacted their pharmacy? Yes.   (Agent: If no, request that the patient contact the pharmacy for the refill.) (Agent: If yes, when and what did the pharmacy advise?)  Preferred Pharmacy (with phone number or street name): [facility] [phone] (?)   Agent: Please be advised that RX refills may take up to 3 business days. We ask that you follow-up with your pharmacy.

## 2019-04-16 NOTE — Discharge Instructions (Addendum)
X-ray negative for any rib fractures Most likely bruising/contusion Please continue Tylenol [id number] every 4-6 hours Flexeril (muscle relaxer) at bedtime- may cause drowsiness Tramadol for severe pain

## 2019-04-16 NOTE — Telephone Encounter (Signed)
Pt requesting a prescription for Accu Check test strips which is not on current med list.  Last OV: 12/21/18

## 2019-04-16 NOTE — ED Provider Notes (Signed)
[place]    CSN: [id number] Arrival date & time: 04/16/19  [place]     History   Chief Complaint Chief Complaint  Patient presents with  . Fall    HPI Brenda Moreno is a 73 y.o. female history of CAD, CHF, CKD, DM type II, hypertension, previous CVA, presenting today for evaluation of right-sided pain after a fall.  Patient slipped in the bathtub while applying oil.  Landed on her right side.  She attempted to grab a chair on the way down.  Denies hitting head or loss of consciousness.  Denies headache or vision changes.  Accident happened Sunday night, approximately 2 days ago.  She has had worsening pain prompting her arrival today.  She has taken some Tylenol without relief.  Denies numbness or tingling.  Denies shortness of breath or chest pain.  Has a lot of discomfort within right back, side as well as right breast.  She has had some bruising to her right thigh, but denies pain in her hip or leg.  Has not had any issues with ambulation.  Is eating and drinking like normal today without abdominal pain nausea or vomiting.  Otherwise feeling fine besides right-sided pain.  Is able to move right arm, but has a lot of pain with this.  HPI  Past Medical History:  Diagnosis Date  . Arthritis   . CAD (coronary artery disease)    S/P cabg  . Carotid artery occlusion   . Cataract   . CHF (congestive heart failure) ([place])   . CKD (chronic kidney disease) 12/21/2018  . DDD (degenerative disc disease), lumbar   . Diabetes mellitus   . Diabetic retinopathy ([place])    NPDR OU  . Hyperlipidemia   . Hypertension   . Hypertensive retinopathy    OU  . Joint pain   . Leg pain   . Myocardial infarction ([place]) 1998  . Peripheral vascular disease ([place])   . PVD (peripheral vascular disease) ([place])   . Stroke [facility])     Patient Active Problem List   Diagnosis Date Noted  . Allergic rhinitis 12/21/2018  . CKD (chronic kidney disease) 12/21/2018  . Encounter for well adult exam with  abnormal findings 12/21/2018  . Sciatica, left side 12/21/2018  . Acute CHF ([place]) 08/11/2018  . Hx of completed stroke 08/11/2018  . Acute on chronic diastolic CHF (congestive heart failure) ([employer]) 08/11/2018  . Anemia 08/11/2018  . Sinus bradycardia 08/11/2018  . Acute on chronic diastolic (congestive) heart failure ([place]) 08/11/2018  . CVA (cerebral vascular accident) ([place]) 01/16/2018  . Chest pain 12/22/2017  . Coronary artery disease involving native coronary artery of native heart without angina pectoris 06/02/2016  . DDD (degenerative disc disease), lumbar   . Diarrhea 07/02/2014  . Fecal incontinence 07/02/2014  . Aftercare following surgery of the circulatory system, [place] 09/26/2013  . 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 Acute sinus infection 07/14/2013  . Arthritis   . Chronic diastolic heart failure ([place])   . Diabetes mellitus, type II, insulin dependent ([place])   . Hyperlipidemia   . Hypertension   . Myocardial infarction ([place])   . PVD (peripheral vascular disease) ([place])   . Peripheral vascular disease, unspecified ([place]) 09/27/2012  . Leg cramps 09/27/2012  . Atherosclerosis of native arteries of the extremities with intermittent claudication 09/22/2011  . Diabetes mellitus ([place]) 11/22/2007  . HYPERCHOLESTEROLEMIA 11/22/2007  . Benign essential HTN 11/22/2007  . HEART ATTACK 11/22/2007  .  CAD (coronary artery disease) 11/22/2007  . RENAL CALCULUS 11/22/2007    Past Surgical History:  Procedure Laterality Date  . APPENDECTOMY    . BREAST EXCISIONAL BIOPSY Left 2008  . CHOLECYSTECTOMY     Gall bladder  . COLONOSCOPY  2008  . CORONARY ARTERY BYPASS GRAFT     1998  . LEFT HEART CATH AND CORS/GRAFTS ANGIOGRAPHY N/A 12/22/2017   Procedure: LEFT HEART CATH AND CORS/GRAFTS ANGIOGRAPHY;  Surgeon: Burnell Blanks, MD;  Location: [place] CV LAB;  Service: Cardiovascular;  Laterality: N/A;  . PR VEIN BYPASS GRAFT,AORTO-FEM-POP    . TEE  WITHOUT CARDIOVERSION N/A 01/19/2018   Procedure: TRANSESOPHAGEAL ECHOCARDIOGRAM (TEE);  Surgeon: Josue Hector, MD;  Location: [facility] ENDOSCOPY;  Service: Cardiovascular;  Laterality: N/A;  . TUBAL LIGATION      OB History   No obstetric history on file.      Home Medications    Prior to Admission medications   Medication Sig Start Date End Date Taking? Authorizing Provider  amLODipine (NORVASC) 5 MG tablet Take 1 tablet (5 mg total) by mouth daily. 12/31/18   Biagio Borg, MD  CINNAMON PO Take 1,000 mg by mouth 2 (two) times daily.      [provider]  clopidogrel (PLAVIX) 75 MG tablet TAKE 1 TABLET BY MOUTH EVERY DAY 02/01/19   Susy Frizzle, MD  clotrimazole-betamethasone (LOTRISONE) cream Apply 1 application topically 2 (two) times daily. 10/12/18   Susy Frizzle, MD  Coenzyme Q10 (CO Q 10 PO) Take 100 mg by mouth daily.      [provider]  cyclobenzaprine (FLEXERIL) 5 MG tablet Take 1 tablet (5 mg total) by mouth at bedtime for 7 days. 04/16/19 04/23/19  Wieters, Hallie C, PA-C  doxazosin (CARDURA) 2 MG tablet TAKE 1 TABLET BY MOUTH EVERY DAY 02/01/19   Susy Frizzle, MD  fluticasone [facility]) 50 MCG/ACT nasal spray Place 2 sprays into both nostrils daily as needed for allergies. 01/14/18   [provider]  furosemide (LASIX) 20 MG tablet TAKE 1 TABLET BY MOUTH DAILY AS NEEDED FOR EDEMA 03/27/19   Biagio Borg, MD  glipiZIDE (GLUCOTROL XL) 2.5 MG 24 hr tablet Take 1 tablet (2.5 mg total) by mouth daily with breakfast. 12/24/18   Biagio Borg, MD  Insulin Isophane & Regular Human (HUMULIN 70/30 KWIKPEN) (70-30) 100 UNIT/ML PEN 15 units with breakfast and 7 units with supper Patient taking differently: Inject 7-15 Units into the skin See admin instructions. Take 15 units in the morning and 7 units at night 05/10/18   Dixon, Lonie Peak, PA-C  JARDIANCE 25 MG TABS tablet Take 25 mg by mouth daily. 01/01/19   [provider]  LIVALO 4 MG TABS TAKE 1 TABLET  BY MOUTH EVERY DAY 03/28/19   Susy Frizzle, MD  metFORMIN (GLUCOPHAGE) 1000 MG tablet TAKE 1 TABLET BY MOUTH  TWICE A DAY WITH MEALS 10/22/18   Susy Frizzle, MD  nitroGLYCERIN (NITROSTAT) 0.4 MG SL tablet Place 1 tablet (0.4 mg total) under the tongue every 5 (five) minutes as needed for chest pain. 11/12/14   Susy Frizzle, MD  quinapril (ACCUPRIL) 40 MG tablet Take 1 tablet (40 mg total) by mouth 2 (two) times daily. 02/11/19   Biagio Borg, MD  traMADol (ULTRAM) 50 MG tablet Take 1 tablet (50 mg total) by mouth every 6 (six) hours as needed. 04/16/19   Wieters, Elesa Hacker, PA-C    Family History  Family History  Problem Relation Age of Onset  . Heart disease Mother   . Diabetes Mother   . Hyperlipidemia Mother   . Hypertension Mother   . Stroke Mother   . Heart disease Father        Heart Disease before age 60  . Diabetes Father   . Hyperlipidemia Father   . Hypertension Father   . Heart disease Sister        heart attack  . Diabetes Sister        Amputation  . Hypertension Sister   . Other Sister        history of amputation  . Heart attack Sister   . Diabetes Brother   . Hypertension Brother   . Heart disease Brother 52       Before age 80  . Hyperlipidemia Brother   . Diabetes Brother        scirrosis of liver  . Coronary artery disease Other   . Colon cancer Neg Hx   . Stomach cancer Neg Hx   . Esophageal cancer Neg Hx     Social History Social History   Tobacco Use  . Smoking status: Former Smoker    Packs/day: 0.25    Types: Cigarettes    Quit date: 09/13/2011    Years since quitting: 7.5  . Smokeless tobacco: Never Used  Substance Use Topics  . Alcohol use: No  . Drug use: No     Allergies   Levaquin [levofloxacin in d5w], Statins, and Tradjenta [linagliptin]   Review of Systems Review of Systems  Constitutional: Negative for activity change, chills, diaphoresis and fatigue.  HENT: Negative for ear pain, tinnitus and trouble swallowing.    Eyes: Negative for photophobia and visual disturbance.  Respiratory: Negative for cough, chest tightness and shortness of breath.   Cardiovascular: Positive for chest pain. Negative for leg swelling.  Gastrointestinal: Negative for abdominal pain, blood in stool, nausea and vomiting.  Musculoskeletal: Positive for back pain and myalgias. Negative for arthralgias, gait problem, neck pain and neck stiffness.  Skin: Negative for color change and wound.  Neurological: Negative for dizziness, weakness, light-headedness, numbness and headaches.     Physical Exam Triage Vital Signs ED Triage Vitals [04/16/19 1902]  Enc Vitals Group     BP 130/65     Pulse Rate 64     Resp 18     Temp 98.8 F (37.1 C)     Temp Source Oral     SpO2 100 %     Weight      Height      Head Circumference      Peak Flow      Pain Score      Pain Loc      Pain Edu?      Excl. in [place]?    No data found.  Updated Vital Signs BP 130/65 (BP Location: Right Arm)   Pulse 64   Temp 98.8 F (37.1 C) (Oral)   Resp 18   SpO2 100%   Visual Acuity Right Eye Distance:   Left Eye Distance:   Bilateral Distance:    Right Eye Near:   Left Eye Near:    Bilateral Near:     Physical Exam Vitals signs and nursing note reviewed.  Constitutional:      General: She is not in acute distress.    Appearance: She is well-developed.  HENT:     Head: Normocephalic and atraumatic.  Eyes:  Extraocular Movements: Extraocular movements intact.     Conjunctiva/sclera: Conjunctivae normal.     Pupils: Pupils are equal, round, and reactive to light.     Comments: Wearing glasses  Neck:     Musculoskeletal: Neck supple.  Cardiovascular:     Rate and Rhythm: Normal rate and regular rhythm.     Heart sounds: No murmur.  Pulmonary:     Effort: Pulmonary effort is normal. No respiratory distress.     Breath sounds: Normal breath sounds.     Comments: Breathing comfortably at rest, CTABL, no wheezing, rales or other  adventitious sounds auscultated Abdominal:     Palpations: Abdomen is soft.     Tenderness: There is no abdominal tenderness.  Musculoskeletal:     Comments: Tenderness to very pinpoint areas in right anterior chest, also has right flank tenderness and upper and lower thoracic tenderness.  Nontender to palpation of cervical, thoracic and lumbar spine midline.  Right shoulder: Nontender to palpation over clavicle, AC joint or scapular spine, full active range of motion of shoulder although movement does elicit pain  Nontender to palpation distally at elbow wrist or fingers, full active range of motion distally.  Radial pulse 2+  Skin:    General: Skin is warm and dry.     Comments: Bruising noted to lateral right breast as well as right anterior thigh  Neurological:     General: No focal deficit present.     Mental Status: She is alert and oriented to person, place, and time. Mental status is at baseline.     Comments: Speech clear, face symmetric  Grip strength 5/5 and equal bilaterally Gait without abnormality      UC Treatments / Results  Labs (all labs ordered are listed, but only abnormal results are displayed) Labs Reviewed - No data to display  EKG   Radiology Dg Ribs Unilateral W/chest Right  Result Date: 04/16/2019 CLINICAL DATA:  Fall in bathtub 2 days ago, pain to RIGHT anterior chest, flank and shoulder. EXAM: RIGHT RIBS AND CHEST - 3+ VIEW COMPARISON:  Chest x-ray dated 08/11/2018. FINDINGS: Heart size and mediastinal contours are within normal limits. Median sternotomy wires appear intact and stable in alignment, for presumed CABG. Lungs are clear. No pleural effusion or pneumothorax seen. Osseous structures about the chest are unremarkable. No RIGHT-sided rib fracture or displacement seen. IMPRESSION: 1. Negative. 2. No rib fracture or displacement. Electronically Signed   By: Franki Cabot M.D.   On: 04/16/2019 20:17    Procedures Procedures (including critical  care time)  Medications Ordered in UC Medications - No data to display  Initial Impression / Assessment and Plan / UC Course  I have reviewed the triage vital signs and the nursing notes.  Pertinent labs & imaging results that were available during my care of the patient were reviewed by me and considered in my medical decision making (see chart for details).    No neuro deficits, no red flags. X-ray negative for acute bony abnormality, rib fractures.  Most likely contusions and straining of muscles within chest and back.  Recommending continued Tylenol, avoid NSAIDs given patient CKD and on Plavix.  Provided Flexeril to use at bedtime.  Also provided a few tablets of tramadol.  Last prescription was 2019, no recent prescriptions.  Given limitations with NSAIDs fill benefit greater than risk for controlling pain.Discussed strict return precautions. Patient verbalized understanding and is agreeable with plan.  Final Clinical Impressions(s) / UC Diagnoses   Final diagnoses:  Musculoskeletal pain  Chest wall pain  Strain of thoracic back region  Fall, initial encounter     Discharge Instructions     X-ray negative for any rib fractures Most likely bruising/contusion Please continue Tylenol [phone] every 4-6 hours Flexeril (muscle relaxer) at bedtime- may cause drowsiness Tramadol for severe pain    ED Prescriptions    Medication Sig Dispense Auth. Provider   traMADol (ULTRAM) 50 MG tablet Take 1 tablet (50 mg total) by mouth every 6 (six) hours as needed. 10 tablet Wieters, Hallie C, PA-C   cyclobenzaprine (FLEXERIL) 5 MG tablet Take 1 tablet (5 mg total) by mouth at bedtime for 7 days. 7 tablet Wieters, Hallie C, PA-C     Controlled Substance Prescriptions [place] Controlled Substance Registry consulted? Yes, I have consulted the [place] Controlled Substances Registry for this patient, and feel the risk/benefit ratio today is favorable for proceeding with this prescription for a  controlled substance.   Janith [place], [place] 04/16/19 2032

## 2019-05-01 NOTE — Telephone Encounter (Signed)
Patient requesting call back today in regard to this refill request.

## 2019-05-01 NOTE — Telephone Encounter (Signed)
Called pt, LVM informing her to follow up with her insurance company since that is who offered the testing supplies to her. We do not have a record that she used the requested med previously or currently.

## 2019-05-01 NOTE — Telephone Encounter (Signed)
Called pt, Brenda Moreno.  Requested med is not on med list.

## 2019-05-01 NOTE — Telephone Encounter (Signed)
Pt called and stated that the insurance company are the ones who use to give it to her. Pt would like a call back from the nurse regarding. Please advise.    [id number]

## 2019-05-04 ENCOUNTER — Other Ambulatory Visit: Payer: Self-pay | Admitting: Family Medicine

## 2019-05-08 ENCOUNTER — Telehealth: Payer: Self-pay | Admitting: Internal Medicine

## 2019-05-08 NOTE — Telephone Encounter (Signed)
Medication Refill - Medication: Pt called and is requesting to have accucheck test strips sent to pharmacy. Please advise.   Has the patient contacted their pharmacy? No. Pt called stating she is out of these strips for 2wks. Please advise.  (Agent: If no, request that the patient contact the pharmacy for the refill.) (Agent: If yes, when and what did the pharmacy advise?)  Preferred Pharmacy (with phone number or street name):  [id number] [place], [place] - [address]  [address] [place] [address]  Phone: [phone] Fax: [phone]  Not a 24 hour pharmacy; exact hours not known.     Agent: Please be advised that RX refills may take up to 3 business days. We ask that you follow-up with your pharmacy.

## 2019-05-08 NOTE — Telephone Encounter (Signed)
Attempted to contact pt again to discuss. Please view 8/18 telephone encounter. She need to get in touch with her insurance company since that is who offered her those services.

## 2019-05-08 NOTE — Telephone Encounter (Signed)
Patient needs a script on Accu check test strips. Patient has been out for 2 weeks. [facility] [address]

## 2019-05-10 NOTE — Telephone Encounter (Signed)
Attempted to contact patient again, female answered the phone and stated that she was not home. I asked him to relay the message for the pt to  give the office a call today before 5pm if she could or first thing Monday morning.

## 2019-05-10 NOTE — Telephone Encounter (Signed)
Patient requesting call back from [place] as soon as possible regarding strips.

## 2019-05-13 ENCOUNTER — Telehealth: Payer: Self-pay | Admitting: Nurse Practitioner

## 2019-05-13 NOTE — Telephone Encounter (Signed)
Please advise.  Pt has requested to move care from Dr.Nahser to Dr. Irish Lack.  Thanks renee

## 2019-05-13 NOTE — Telephone Encounter (Signed)
Ok with me 

## 2019-05-13 NOTE — Telephone Encounter (Signed)
-----   Message from Holley Dexter sent at 05/13/2019  9:38 AM EDT ----- Regarding: Pt requesting change Physicians Please advise. Pt has requested to move care from Dr.Nahser to Dr. Irish Lack. Thanks renee

## 2019-05-17 ENCOUNTER — Other Ambulatory Visit: Payer: Self-pay | Admitting: Internal Medicine

## 2019-05-17 DIAGNOSIS — Z1231 Encounter for screening mammogram for malignant neoplasm of breast: Secondary | ICD-10-CM

## 2019-05-17 NOTE — Telephone Encounter (Signed)
OK 

## 2019-05-21 NOTE — Telephone Encounter (Addendum)
After finally being able to speak with the pt today. I got clarification that she would like PCP to start prescribing test strips for her. She was previously been seeing Dr. Dennard Schaumann which is no longer seeing. I informed her the reason that strips were never called in was because it was not listed in her current or past medications to be considered a "refill". During our game of phone tag over the last couple of weeks I could not relay that information to her.  After speaking with Dr. Jenny Reichmann after disconnecting with the pt he is ok with prescribing the test strips.   I tried to contact the patient again but received the VM. I seen that she requested Accu-Chek strips but I need to know if she needs the Accu-Chek guide or Accu-Chek plus test strips. I also need to know how many times a day she test her blood glucose.

## 2019-05-21 NOTE — Telephone Encounter (Signed)
Patient would like a new script for test strips, patient would like Dr. Jenny Reichmann to prescribed  [id number] [name], [place] - [address] [phone] (Phone) [phone] (Fax)

## 2019-05-22 MED ORDER — ACCU-CHEK GUIDE VI STRP: ORAL_STRIP | 11 refills | Status: DC

## 2019-05-22 NOTE — Telephone Encounter (Signed)
Spoke to pt this morning. She needs Accu-Chek guide tet strips needed. Testing 3x a day.  Rx has been sent to her local pharmacy.

## 2019-05-22 NOTE — Addendum Note (Signed)
Addended by: Juliet Rude on: 05/22/2019 07:47 AM   Modules accepted: Orders

## 2019-05-29 ENCOUNTER — Encounter (HOSPITAL_COMMUNITY): Payer: Medicare Other

## 2019-05-29 ENCOUNTER — Ambulatory Visit: Payer: Medicare Other | Admitting: Family

## 2019-05-30 ENCOUNTER — Other Ambulatory Visit: Payer: Self-pay

## 2019-05-30 DIAGNOSIS — I779 Disorder of arteries and arterioles, unspecified: Secondary | ICD-10-CM

## 2019-06-05 IMAGING — CR DG CHEST 2V
2 series · 2 of 2 positions shown · non-contrast
Comparison: Radiographs October 20, 2016.

CLINICAL DATA: Chest pain.

EXAM:
CHEST - 2 VIEW

[chest pa]
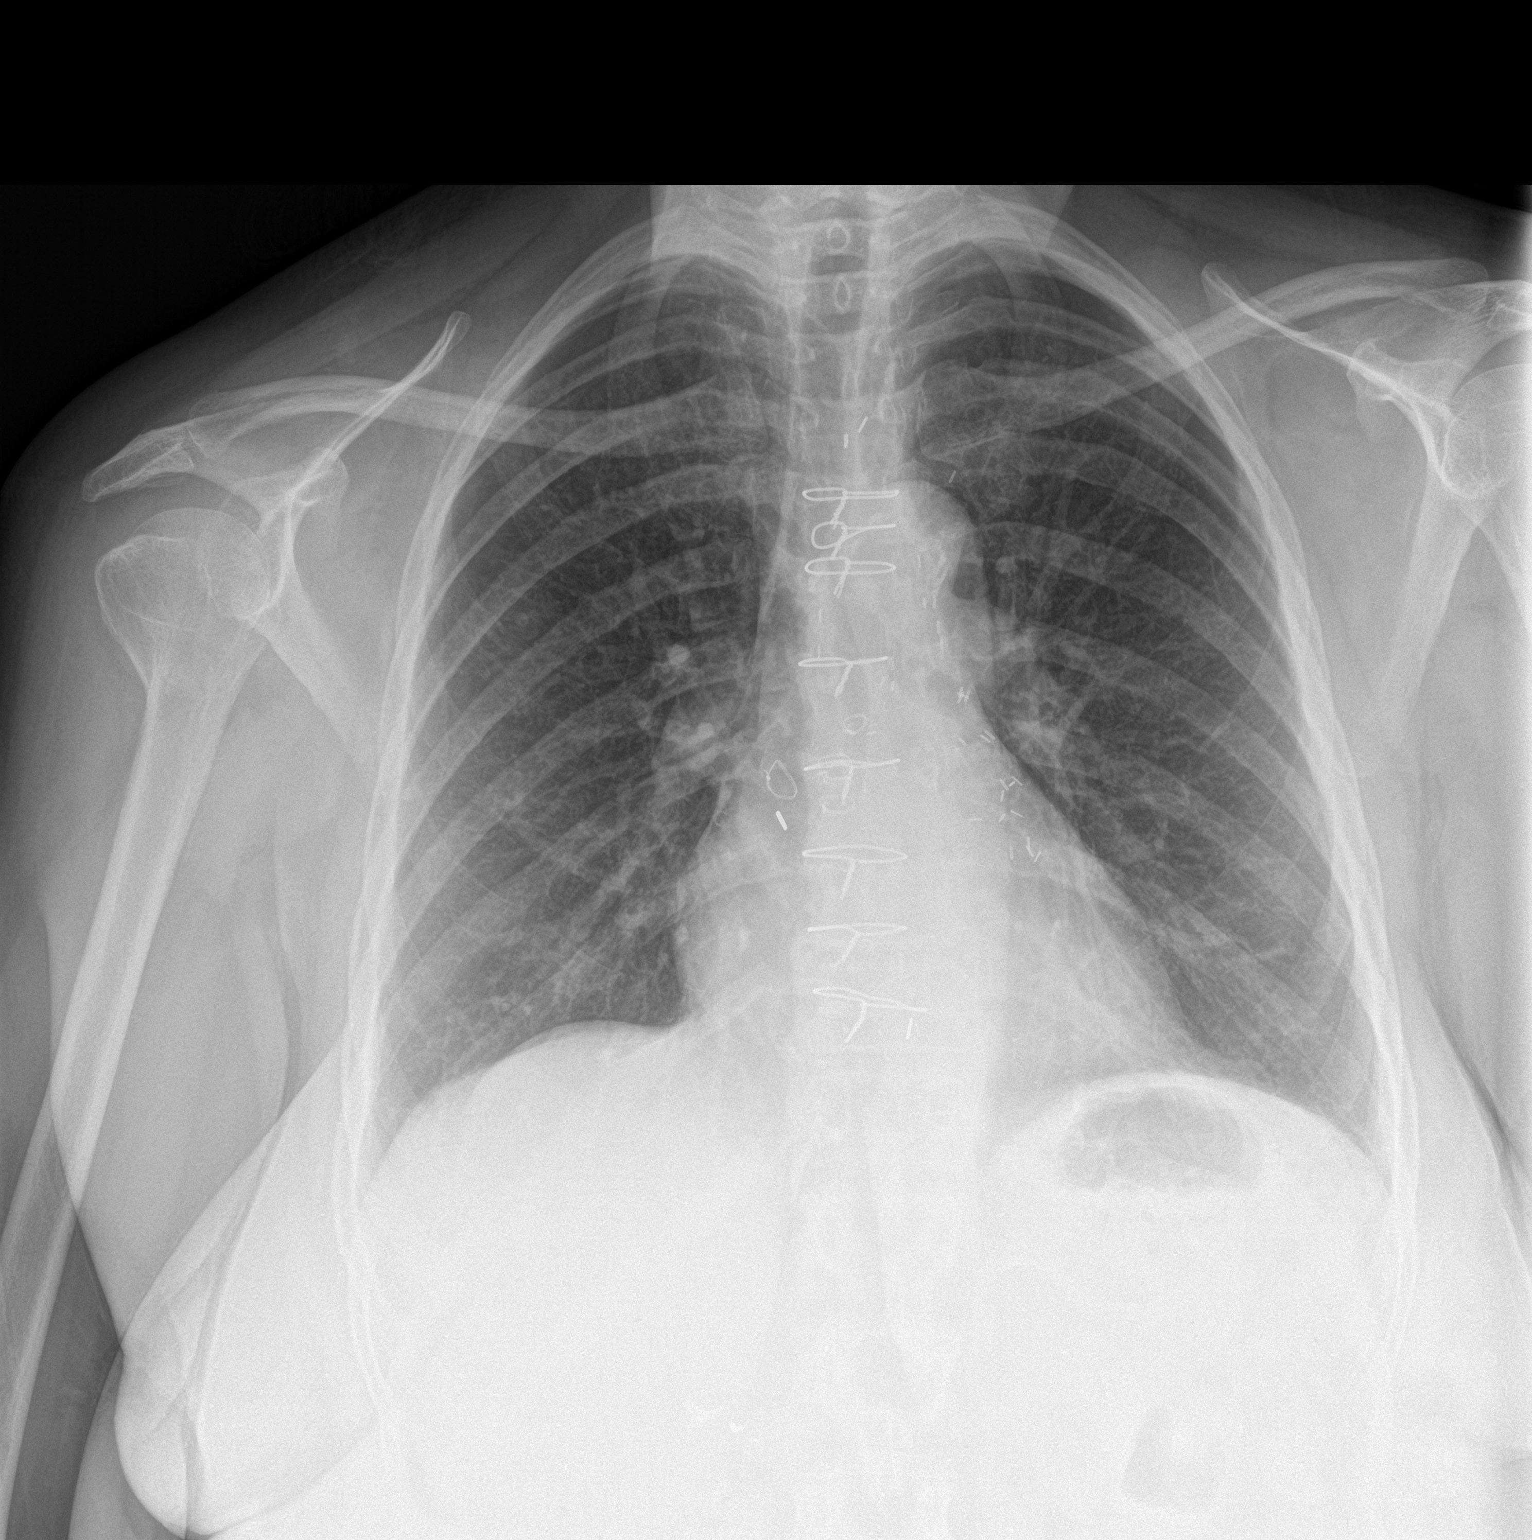

[chest lat]
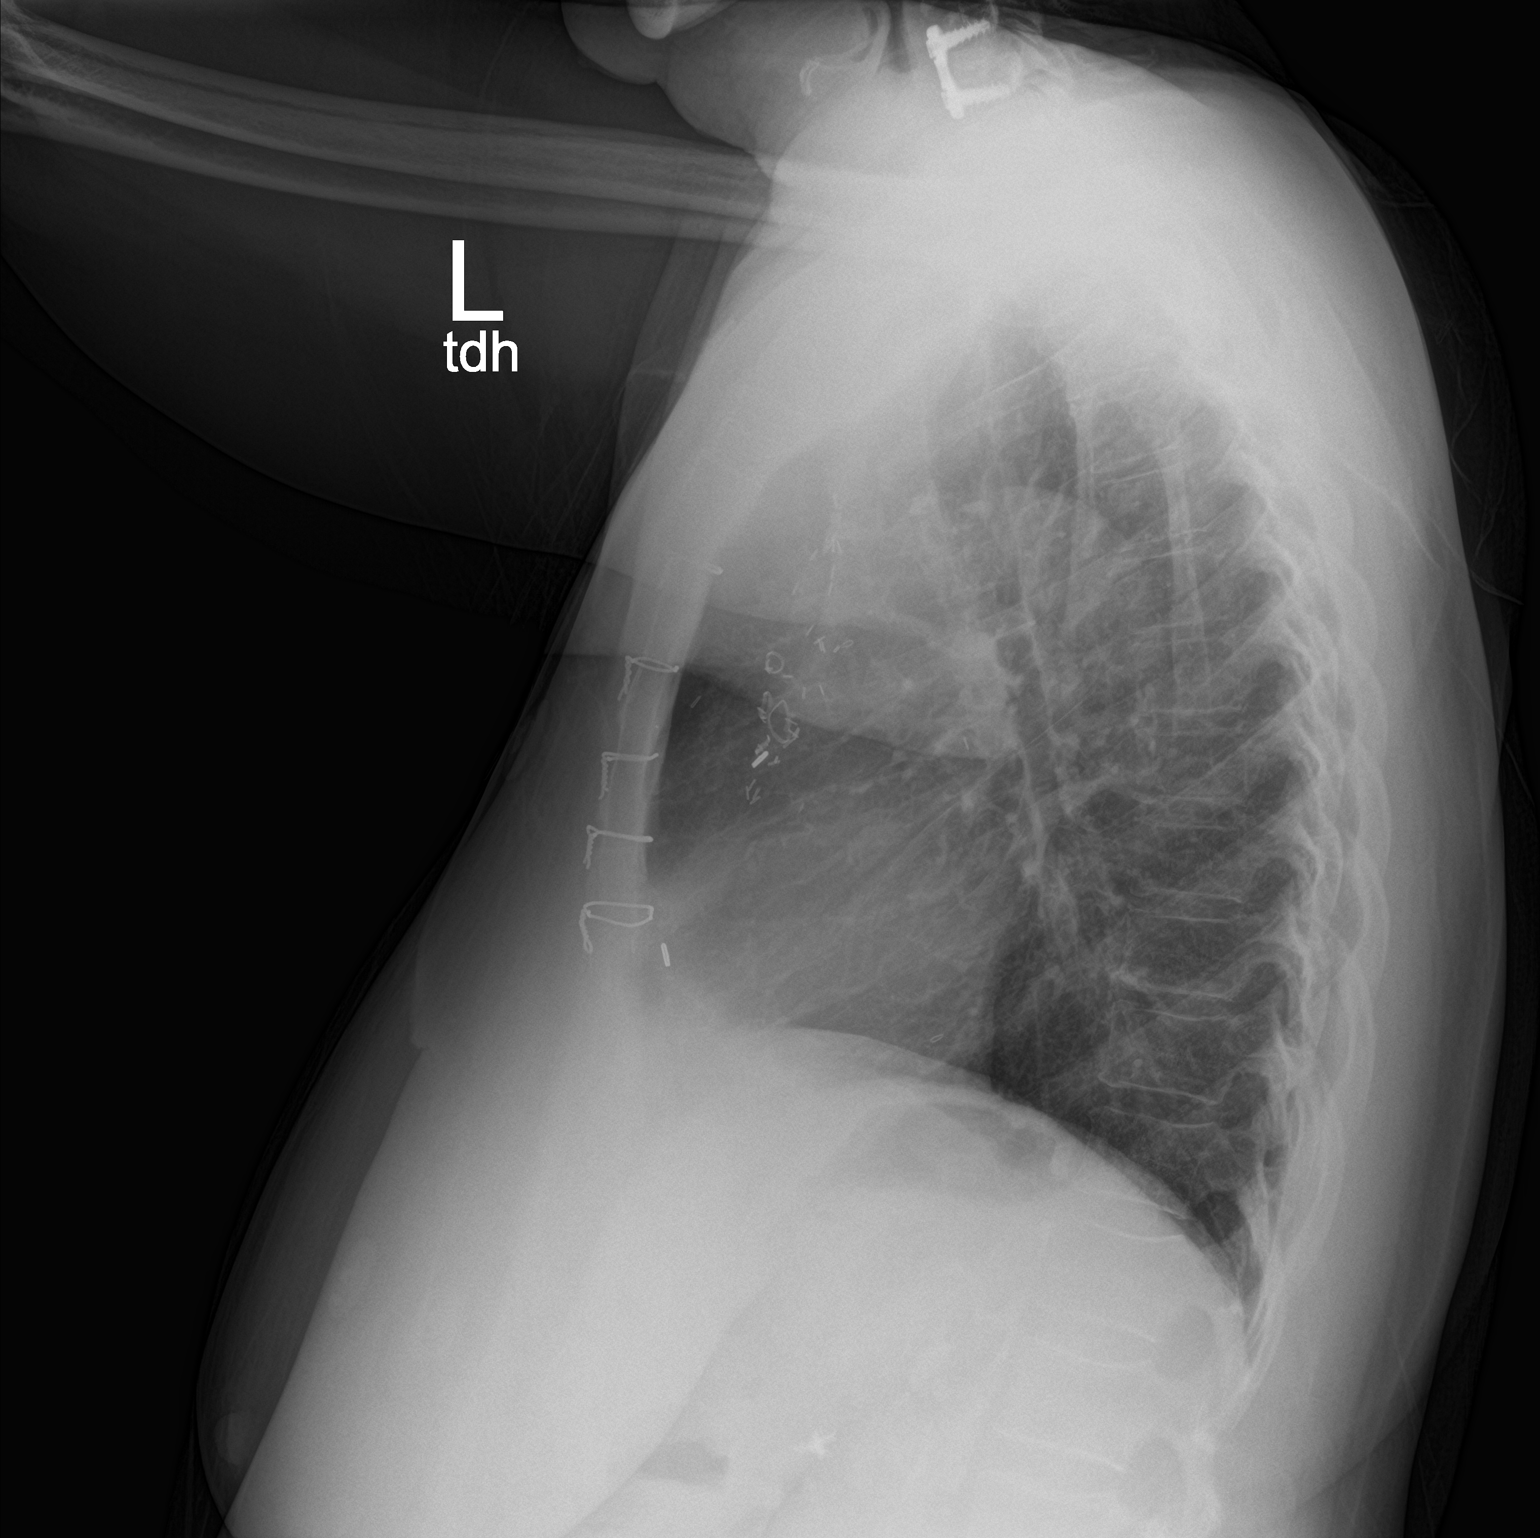

[2 of 2 positions shown; findings below may reference images not displayed]

FINDINGS: The heart size and mediastinal contours are within normal limits.
Both lungs are clear. Status post coronary artery bypass graft. No
pneumothorax or pleural effusion is noted. The visualized skeletal
structures are unremarkable.
IMPRESSION: No active cardiopulmonary disease.

## 2019-06-07 ENCOUNTER — Ambulatory Visit (HOSPITAL_COMMUNITY)
Admission: RE | Admit: 2019-06-07 | Discharge: 2019-06-07 | Disposition: A | Discharge status: Home / Self Care | Payer: Medicare Other | Source: Ambulatory Visit | Attending: Family | Admitting: Family

## 2019-06-07 ENCOUNTER — Encounter: Payer: Self-pay | Admitting: Family

## 2019-06-07 ENCOUNTER — Other Ambulatory Visit: Payer: Self-pay

## 2019-06-07 ENCOUNTER — Ambulatory Visit (INDEPENDENT_AMBULATORY_CARE_PROVIDER_SITE_OTHER): Payer: Medicare Other | Admitting: Family

## 2019-06-07 VITALS — BP 133/78 | HR 77 | Temp 97.2°F | Resp 16 | Ht 66.0 in | Wt 158.0 lb

## 2019-06-07 DIAGNOSIS — Z8673 Personal history of transient ischemic attack (TIA), and cerebral infarction without residual deficits: Secondary | ICD-10-CM | POA: Diagnosis not present

## 2019-06-07 DIAGNOSIS — I779 Disorder of arteries and arterioles, unspecified: Secondary | ICD-10-CM | POA: Diagnosis not present

## 2019-06-07 DIAGNOSIS — Z87891 Personal history of nicotine dependence: Secondary | ICD-10-CM | POA: Diagnosis not present

## 2019-06-07 DIAGNOSIS — Z95828 Presence of other vascular implants and grafts: Secondary | ICD-10-CM

## 2019-06-07 DIAGNOSIS — E1151 Type 2 diabetes mellitus with diabetic peripheral angiopathy without gangrene: Secondary | ICD-10-CM | POA: Diagnosis not present

## 2019-06-07 NOTE — Patient Instructions (Addendum)
Stroke Prevention Some medical conditions and lifestyle choices can lead to a higher risk for a stroke. You can help to prevent a stroke by making nutrition, lifestyle, and other changes. What nutrition changes can be made?   Eat healthy foods. ? Choose foods that are high in fiber. These include:  Fresh fruits.  Fresh vegetables.  Whole grains. ? Eat at least 5 or more servings of fruits and vegetables each day. Try to fill half of your plate at each meal with fruits and vegetables. ? Choose lean protein foods. These include:  Lowfat (lean) cuts of meat.  Chicken without skin.  Fish.  Tofu.  Beans.  Nuts. ? Eat low-fat dairy products. ? Avoid foods that:  Are high in salt (sodium).  Have saturated fat.  Have trans fat.  Have cholesterol.  Are processed.  Are premade.  Follow eating guidelines as told by your doctor. These may include: ? Reducing how many calories you eat and drink each day. ? Limiting how much salt you eat or drink each day to 1,500 milligrams (mg). ? Using only healthy fats for cooking. These include:  Olive oil.  Canola oil.  Sunflower oil. ? Counting how many carbohydrates you eat and drink each day. What lifestyle changes can be made?  Try to stay at a healthy weight. Talk to your doctor about what a good weight is for you.  Get at least 30 minutes of moderate physical activity at least 5 days a week. This can include: ? Fast walking. ? Biking. ? Swimming.  Do not use any products that have nicotine or tobacco. This includes cigarettes and e-cigarettes. If you need help quitting, ask your doctor. Avoid being around tobacco smoke in general.  Limit how much alcohol you drink to no more than 1 drink a day for nonpregnant women and 2 drinks a day for men. One drink equals 12 oz of beer, 5 oz of wine, or 1 oz of hard liquor.  Do not use drugs.  Avoid taking birth control pills. Talk to your doctor about the risks of taking birth  control pills if: ? You are over 35 years old. ? You smoke. ? You get migraines. ? You have had a blood clot. What other changes can be made?  Manage your cholesterol. ? It is important to eat a healthy diet. ? If your cholesterol cannot be managed through your diet, you may also need to take medicines. Take medicines as told by your doctor.  Manage your diabetes. ? It is important to eat a healthy diet and to exercise regularly. ? If your blood sugar cannot be managed through diet and exercise, you may need to take medicines. Take medicines as told by your doctor.  Control your high blood pressure (hypertension). ? Try to keep your blood pressure below 130/80. This can help lower your risk of stroke. ? It is important to eat a healthy diet and to exercise regularly. ? If your blood pressure cannot be managed through diet and exercise, you may need to take medicines. Take medicines as told by your doctor. ? Ask your doctor if you should check your blood pressure at home. ? Have your blood pressure checked every year. Do this even if your blood pressure is normal.  Talk to your doctor about getting checked for a sleep disorder. Signs of this can include: ? Snoring a lot. ? Feeling very tired.  Take over-the-counter and prescription medicines only as told by your doctor. These may   include aspirin or blood thinners (antiplatelets or anticoagulants).  Make sure that any other medical conditions you have are managed. Where to find more information  American Stroke Association: www.strokeassociation.org  National Stroke Association: www.stroke.org Get help right away if:  You have any symptoms of stroke. "BE FAST" is an easy way to remember the main warning signs: ? B - Balance. Signs are dizziness, sudden trouble walking, or loss of balance. ? E - Eyes. Signs are trouble seeing or a sudden change in how you see. ? F - Face. Signs are sudden weakness or loss of feeling of the face,  or the face or eyelid drooping on one side. ? A - Arms. Signs are weakness or loss of feeling in an arm. This happens suddenly and usually on one side of the body. ? S - Speech. Signs are sudden trouble speaking, slurred speech, or trouble understanding what people say. ? T - Time. Time to call emergency services. Write down what time symptoms started.  You have other signs of stroke, such as: ? A sudden, very bad headache with no known cause. ? Feeling sick to your stomach (nausea). ? Throwing up (vomiting). ? Jerky movements you cannot control (seizure). These symptoms may represent a serious problem that is an emergency. Do not wait to see if the symptoms will go away. Get medical help right away. Call your local emergency services (911 in the [place] Do not drive yourself to the hospital. Summary  You can prevent a stroke by eating healthy, exercising, not smoking, drinking less alcohol, and treating other health problems, such as diabetes, high blood pressure, or high cholesterol.  Do not use any products that contain nicotine or tobacco, such as cigarettes and e-cigarettes.  Get help right away if you have any signs or symptoms of a stroke. This information is not intended to replace advice given to you by your health care provider. Make sure you discuss any questions you have with your health care provider. Document Released: 02/14/2012 Document Revised: 10/11/2018 Document Reviewed: 11/16/2016 Elsevier Patient Education  2020 Elsevier Inc.     Peripheral Vascular Disease  Peripheral vascular disease (PVD) is a disease of the blood vessels that are not part of your heart and brain. A simple term for PVD is poor circulation. In most cases, PVD narrows the blood vessels that carry blood from your heart to the rest of your body. This can reduce the supply of blood to your arms, legs, and internal organs, like your stomach or kidneys. However, PVD most often affects a person's lower  legs and feet. Without treatment, PVD tends to get worse. PVD can also lead to acute ischemic limb. This is when an arm or leg suddenly cannot get enough blood. This is a medical emergency. Follow these instructions at home: Lifestyle  Do not use any products that contain nicotine or tobacco, such as cigarettes and e-cigarettes. If you need help quitting, ask your doctor.  Lose weight if you are overweight. Or, stay at a healthy weight as told by your doctor.  Eat a diet that is low in fat and cholesterol. If you need help, ask your doctor.  Exercise regularly. Ask your doctor for activities that are right for you. General instructions  Take over-the-counter and prescription medicines only as told by your doctor.  Take good care of your feet: ? Wear comfortable shoes that fit well. ? Check your feet often for any cuts or sores.  Keep all follow-up visits as told   by your doctor This is important. Contact a doctor if:  You have cramps in your legs when you walk.  You have leg pain when you are at rest.  You have coldness in a leg or foot.  Your skin changes.  You are unable to get or have an erection (erectile dysfunction).  You have cuts or sores on your feet that do not heal. Get help right away if:  Your arm or leg turns cold, numb, and blue.  Your arms or legs become red, warm, swollen, painful, or numb.  You have chest pain.  You have trouble breathing.  You suddenly have weakness in your face, arm, or leg.  You become very confused or you cannot speak.  You suddenly have a very bad headache.  You suddenly cannot see. Summary  Peripheral vascular disease (PVD) is a disease of the blood vessels.  A simple term for PVD is poor circulation. Without treatment, PVD tends to get worse.  Treatment may include exercise, low fat and low cholesterol diet, and quitting smoking. This information is not intended to replace advice given to you by your health care  provider. Make sure you discuss any questions you have with your health care provider. Document Released: 11/09/2009 Document Revised: 07/28/2017 Document Reviewed: 09/22/2016 Elsevier Patient Education  2020 Elsevier Inc.  

## 2019-06-07 NOTE — Progress Notes (Signed)
Brenda Moreno   CC: Follow up peripheral artery occlusive disease  History of Present Illness Brenda Moreno is a 73 y.o. female who is status post right CIA stent in 2007 by Dr. Oneida Alar.  She had an MI in 1998, then 6 vessel CABG. Pt denies non-healing wounds.  Pt reports a stroke at age 43 and has left side weakness from this, improved after physical therapy. She states that she has arthritis.   She had a CTA head and neck in May 2019 for dizziness and trouble balancing. This showed:  CTA neck: 1. Patent carotid and vertebral arteries. No significant stenosis by NASCET criteria, dissection, or occlusion. 2. Calcified plaque bilateral vertebral artery origins with mild less than 50% stenosis. 3. Calcified plaque of left subclavian artery origin, partially obscured by streak artifact, approximately 50% mild-to-moderate stenosis.  CTA head: 1. Patent anterior and posterior intracranial circulation. No high-grade stenosis, aneurysm, large vessel occlusion, or Brenda malformation. 2. Multiple segments of mild stenosis in the anterior and posterior circulations as well as mild-to-moderate distal left cavernous ICA Stenosis.   She states that she had neck surgery in [place], that sounds like it was for c-spine issues, states it was for difficulty rotating her neck and head; since the surgery she can rotate her head well.   She has what sounds like lumbar HNP, had ESI's which helped to a large degree, diagnosed with IBS, had vomiting, diarrhea; this is now under better control with probiotics and eating beans, lost about 20 pounds with this, but has gained most of it back since she is no longer having diarrhea.  Standing elicits numbness and pain that seems to start in her left foot and radiate proximally.  She cares for her 2 disabled brothers.   She also states kidney problems, last serum creatinine result on file was 1.18, GFR was 54.65 on  12-21-18. She states she has been walkingless to to claudication pain in legs; she has been walking in a pool of water to exercise her legs.   Diabetic: Yes, last A1C result on file was 8.4 on 12-21-18, she states that her blood sugars have improved since then. Dr. Cathlean Cower is her new PCP Tobacco [id number] smoker, quit in November 2017, started at age 65.   Pt meds include: Statin :Yes, several statins caused legs weakness, is tolerating the current statin, Livalo Betablocker: no ASA: no  Other anticoagulants/antiplatelets: Plavix, started by her PCP   Past Medical History:  Diagnosis Date  . Arthritis   . CAD (coronary artery disease)    S/P cabg  . Carotid artery occlusion   . Cataract   . CHF (congestive heart failure) ([place])   . CKD (chronic kidney disease) 12/21/2018  . DDD (degenerative disc disease), lumbar   . Diabetes mellitus   . Diabetic retinopathy ([place])    NPDR OU  . Hyperlipidemia   . Hypertension   . Hypertensive retinopathy    OU  . Joint pain   . Leg pain   . Myocardial infarction ([place]) 1998  . Peripheral Brenda disease ([place])   . PVD (peripheral Brenda disease) ([place])   . Stroke [facility])     Social History Social History   Tobacco Use  . Smoking status: Former Smoker    Packs/day: 0.25    Types: Cigarettes    Quit date: 09/13/2011    Years since quitting: 7.7  . Smokeless tobacco: Never Used  Substance Use Topics  . Alcohol use: No  .  Drug use: No    Family History Family History  Problem Relation Age of Onset  . Heart disease Mother   . Diabetes Mother   . Hyperlipidemia Mother   . Hypertension Mother   . Stroke Mother   . Heart disease Father        Heart Disease before age 94  . Diabetes Father   . Hyperlipidemia Father   . Hypertension Father   . Heart disease Sister        heart attack  . Diabetes Sister        Amputation  . Hypertension Sister   . Other Sister        history of amputation  . Heart attack Sister   .  Diabetes Brother   . Hypertension Brother   . Heart disease Brother 11       Before age 60  . Hyperlipidemia Brother   . Diabetes Brother        scirrosis of liver  . Coronary artery disease Other   . Colon cancer Neg Hx   . Stomach cancer Neg Hx   . Esophageal cancer Neg Hx     Past Surgical History:  Procedure Laterality Date  . APPENDECTOMY    . BREAST EXCISIONAL BIOPSY Left 2008  . CHOLECYSTECTOMY     Gall bladder  . COLONOSCOPY  2008  . CORONARY ARTERY BYPASS GRAFT     1998  . LEFT HEART CATH AND CORS/GRAFTS ANGIOGRAPHY N/A 12/22/2017   Procedure: LEFT HEART CATH AND CORS/GRAFTS ANGIOGRAPHY;  Surgeon: Burnell Blanks, MD;  Location: [facility];  Service: Cardiovascular;  Laterality: N/A;  . PR VEIN BYPASS GRAFT,AORTO-FEM-POP    . TEE WITHOUT CARDIOVERSION N/A 01/19/2018   Procedure: TRANSESOPHAGEAL ECHOCARDIOGRAM (TEE);  Surgeon: Josue Hector, MD;  Location: [facility] ENDOSCOPY;  Service: Cardiovascular;  Laterality: N/A;  . TUBAL LIGATION      Allergies  Allergen Reactions  . Levaquin [Levofloxacin In D5w] Other (See Comments)    Pain all over and in joints  . Statins Other (See Comments)    Severe myalgias to lipitor, crestor, pravastatin and weakness and cramping  in bilateral leg.  [name] [Linagliptin] Itching    Current Outpatient Medications  Medication Sig Dispense Refill  . amLODipine (NORVASC) 5 MG tablet Take 1 tablet (5 mg total) by mouth daily. 90 tablet 1  . CINNAMON PO Take 1,000 mg by mouth 2 (two) times daily.      . clopidogrel (PLAVIX) 75 MG tablet TAKE 1 TABLET BY MOUTH EVERY DAY 90 tablet 3  . clotrimazole-betamethasone (LOTRISONE) cream Apply 1 application topically 2 (two) times daily. 30 g 0  . Coenzyme Q10 (CO Q 10 PO) Take 100 mg by mouth daily.      Marland Kitchen doxazosin (CARDURA) 2 MG tablet TAKE 1 TABLET BY MOUTH EVERY DAY 90 tablet 0  . fluticasone (FLONASE) 50 MCG/ACT nasal spray Place 2 sprays into both nostrils daily as needed  for allergies.    . furosemide (LASIX) 20 MG tablet TAKE 1 TABLET BY MOUTH DAILY AS NEEDED FOR EDEMA 90 tablet 1  . glipiZIDE (GLUCOTROL XL) 2.5 MG 24 hr tablet Take 1 tablet (2.5 mg total) by mouth daily with breakfast. 90 tablet 3  . glucose blood (ACCU-CHEK GUIDE) test strip Use to test blood sugar up to 3 time a day E11.9 100 each 11  . Insulin Isophane & Regular Human (HUMULIN 70/30 KWIKPEN) (70-30) 100 UNIT/ML PEN 15 units with breakfast  and 7 units with supper (Patient taking differently: Inject 7-15 Units into the skin See admin instructions. Take 15 units in the morning and 7 units at night) 15 mL 11  . JARDIANCE 25 MG TABS tablet TAKE 1 TABLET BY MOUTH EVERY DAY 30 tablet 5  . LIVALO 4 MG TABS TAKE 1 TABLET BY MOUTH EVERY DAY 90 tablet 1  . metFORMIN (GLUCOPHAGE) 1000 MG tablet TAKE 1 TABLET BY MOUTH  TWICE A DAY WITH MEALS 180 tablet 1  . nitroGLYCERIN (NITROSTAT) 0.4 MG SL tablet Place 1 tablet (0.4 mg total) under the tongue every 5 (five) minutes as needed for chest pain. 90 tablet 2  . quinapril (ACCUPRIL) 40 MG tablet Take 1 tablet (40 mg total) by mouth 2 (two) times daily. 180 tablet 3  . traMADol (ULTRAM) 50 MG tablet Take 1 tablet (50 mg total) by mouth every 6 (six) hours as needed. 10 tablet 0   No current facility-administered medications for this visit.     ROS: See HPI for pertinent positives and negatives.   Physical Examination  Vitals:   06/07/19 1023  BP: 133/78  Pulse: 77  Resp: 16  Temp: (!) 97.2 F (36.2 C)  TempSrc: Temporal  SpO2: 99%  Weight: 158 lb (71.7 kg)  Height: 5\' 6"  (1.676 m)   Body mass index is 25.5 kg/m.  General: A&O x 3, WDWN, female in NAD. Gait: normal HENT: No gross abnormalities.  Eyes: PERRLA. Pulmonary: Respirations are non labored, CTAB, good air movement in all fields Cardiac: regular rhythm, no detected murmur.         Carotid Bruits Right Left   Negative Negative   Radial pulses are 2+ right, 1+ left palpable   Adominal aortic pulse is not palpable                         Brenda EXAM: Extremities without ischemic changes, without Gangrene; without open wounds.                                                                                                          LE Pulses Right Left       FEMORAL  1+ palpable  2+ palpable        POPLITEAL  not palpable   not palpable       POSTERIOR TIBIAL  not palpable   not palpable        DORSALIS PEDIS      ANTERIOR TIBIAL 1+ palpable  1+ palpable    Abdomen: soft, NT, no palpable masses. Skin: no rashes, no cellulitis, no ulcers noted. Musculoskeletal: no muscle wasting or atrophy.  Neurologic: A&O X 3; appropriate affect, Sensation is normal; MOTOR FUNCTION:  moving all extremities equally, motor strength 5/5 throughout. Speech is fluent/normal. CN 2-12 intact. Psychiatric: Thought content is normal, mood appropriate for clinical situation.    DATA  ABI (Date: 06/07/2019): ABI Findings: +---------+------------------+-----+---------+--------+ Right    Rt Pressure (mmHg)IndexWaveform Comment  +---------+------------------+-----+---------+--------+ Brachial 136                                      +---------+------------------+-----+---------+--------+  PTA      103               0.76 triphasic         +---------+------------------+-----+---------+--------+ DP       124               0.91 triphasic         +---------+------------------+-----+---------+--------+ Great Toe116               0.85                   +---------+------------------+-----+---------+--------+  +---------+------------------+-----+---------+-------+ Left     Lt Pressure (mmHg)IndexWaveform Comment +---------+------------------+-----+---------+-------+ Brachial 133                                     +---------+------------------+-----+---------+-------+ PTA      107               0.79 triphasic         +---------+------------------+-----+---------+-------+ DP       115               0.85 triphasic        +---------+------------------+-----+---------+-------+ Great Toe84                0.62                  +---------+------------------+-----+---------+-------+  +-------+-----------+-----------+------------+------------+ ABI/TBIToday's ABIToday's TBIPrevious ABIPrevious TBI +-------+-----------+-----------+------------+------------+ Right  0.91       0.85       1           0.99         +-------+-----------+-----------+------------+------------+ Left   0.85       0.62       0.93        0.55         +-------+-----------+-----------+------------+------------+  Previous ABI on 12/07/17. Right ABIs and TBIs appear decreased. Left ABIs appear decreased.   Summary: Right: Resting right ankle-brachial index indicates mild right lower extremity arterial disease. The right toe-brachial index is normal. RT great toe pressure = 116 mmHg.  Left: Resting left ankle-brachial index indicates mild left lower extremity arterial disease. The left toe-brachial index is abnormal. LT Great toe pressure = 84 mmHg.   Bilateral Iliac Artery Duplex (12/07/17): Right CIA: 322 cm/s at proximal stent, all biphasic waveforms Left CIA: 433 cm/s at proximal CIA, all biphasic waveforms. Increased stenosis in the right CIA compared to the exam on 11-30-16. Left CIA not studied on 11-30-16.    ASSESSMENT: LEATA SITEK is a 73 y.o. female who is status post a right CIA stent in 2007 with what seems to be neurogenic claudication symptoms in her legs.  Her atherosclerotic risk factors include uncontrolled DM, 43 year hx of smoking (quit in November 2017), and CAD. She takes a daily statin and Plavix.   Her walking seems limited by her back issues and left leg weakness since her stroke in 2019;  her ABI's show mild disease bilaterally with all triphasic waveforms. Bilateral  femoral pulses are palpable.   She had a stroke in 2019 with left hemiparesis, CTA of head and neck results in HPI. Will check carotid duplex on her return in a year.  I discussed with Dr. Oneida Alar elevated velocities in bilateral proximal CIA at her visit on 12-07-17, No need to duplex iliac arteries unless ABI's decline signficantly and/or clinical picture  indicates the need to evaluate iliac arteries.     PLAN:  Based on the patient's Brenda studies and examination, pt will return to clinic in 1 year with carotid duplex and ABI's.  I advised her to notify us if she develops concerns re the circulation in her feet/legs   Daily seated leg exercises discussed and demonstrated. Gradually increase walking in a safe environment, stop when needed, resume, and repeat.   Referral to [facility] for DM feet care and mild swelling in her left ankle that is somewhat bothersome to her.   I discussed in depth with the patient the nature of atherosclerosis, and emphasized the importance of maximal medical management including strict control of blood pressure, blood glucose, and lipid levels, obtaining regular exercise, and continued cessation of smoking.  The patient is aware that without maximal medical management the underlying atherosclerotic disease process will progress, limiting the benefit of any interventions.  The patient was given information about PAD including signs, symptoms, treatment, what symptoms should prompt the patient to seek immediate medical care, and risk reduction measures to take.  Clemon Chambers, RN, MSN, FNP-C [facility] Phone: [phone]  Clinic MD: Donzetta Matters  06/07/19 10:38 AM

## 2019-06-10 ENCOUNTER — Other Ambulatory Visit: Payer: Self-pay | Admitting: Family Medicine

## 2019-06-12 ENCOUNTER — Encounter (HOSPITAL_COMMUNITY): Payer: Self-pay

## 2019-06-12 ENCOUNTER — Ambulatory Visit (HOSPITAL_COMMUNITY)
Admission: EM | Admit: 2019-06-12 | Discharge: 2019-06-12 | Disposition: A | Discharge status: Home / Self Care | Payer: Medicare Other | Attending: Family Medicine | Admitting: Family Medicine

## 2019-06-12 ENCOUNTER — Ambulatory Visit: Payer: Medicare Other | Admitting: Internal Medicine

## 2019-06-12 DIAGNOSIS — J069 Acute upper respiratory infection, unspecified: Secondary | ICD-10-CM | POA: Diagnosis not present

## 2019-06-12 DIAGNOSIS — I5032 Chronic diastolic (congestive) heart failure: Secondary | ICD-10-CM | POA: Insufficient documentation

## 2019-06-12 DIAGNOSIS — I2510 Atherosclerotic heart disease of native coronary artery without angina pectoris: Secondary | ICD-10-CM | POA: Insufficient documentation

## 2019-06-12 DIAGNOSIS — E1151 Type 2 diabetes mellitus with diabetic peripheral angiopathy without gangrene: Secondary | ICD-10-CM | POA: Insufficient documentation

## 2019-06-12 DIAGNOSIS — I219 Acute myocardial infarction, unspecified: Secondary | ICD-10-CM | POA: Diagnosis not present

## 2019-06-12 DIAGNOSIS — D649 Anemia, unspecified: Secondary | ICD-10-CM | POA: Diagnosis not present

## 2019-06-12 DIAGNOSIS — E1136 Type 2 diabetes mellitus with diabetic cataract: Secondary | ICD-10-CM | POA: Diagnosis not present

## 2019-06-12 DIAGNOSIS — E113293 Type 2 diabetes mellitus with mild nonproliferative diabetic retinopathy without macular edema, bilateral: Secondary | ICD-10-CM | POA: Insufficient documentation

## 2019-06-12 DIAGNOSIS — R531 Weakness: Secondary | ICD-10-CM | POA: Insufficient documentation

## 2019-06-12 DIAGNOSIS — N189 Chronic kidney disease, unspecified: Secondary | ICD-10-CM | POA: Diagnosis not present

## 2019-06-12 DIAGNOSIS — Z7901 Long term (current) use of anticoagulants: Secondary | ICD-10-CM | POA: Insufficient documentation

## 2019-06-12 DIAGNOSIS — Z794 Long term (current) use of insulin: Secondary | ICD-10-CM | POA: Diagnosis not present

## 2019-06-12 DIAGNOSIS — I252 Old myocardial infarction: Secondary | ICD-10-CM | POA: Diagnosis not present

## 2019-06-12 DIAGNOSIS — Z79899 Other long term (current) drug therapy: Secondary | ICD-10-CM | POA: Diagnosis not present

## 2019-06-12 DIAGNOSIS — E785 Hyperlipidemia, unspecified: Secondary | ICD-10-CM | POA: Diagnosis not present

## 2019-06-12 DIAGNOSIS — M5136 Other intervertebral disc degeneration, lumbar region: Secondary | ICD-10-CM | POA: Insufficient documentation

## 2019-06-12 DIAGNOSIS — I130 Hypertensive heart and chronic kidney disease with heart failure and stage 1 through stage 4 chronic kidney disease, or unspecified chronic kidney disease: Secondary | ICD-10-CM | POA: Insufficient documentation

## 2019-06-12 DIAGNOSIS — E119 Type 2 diabetes mellitus without complications: Secondary | ICD-10-CM

## 2019-06-12 DIAGNOSIS — Z8249 Family history of ischemic heart disease and other diseases of the circulatory system: Secondary | ICD-10-CM | POA: Insufficient documentation

## 2019-06-12 DIAGNOSIS — Z1159 Encounter for screening for other viral diseases: Secondary | ICD-10-CM

## 2019-06-12 DIAGNOSIS — R0981 Nasal congestion: Secondary | ICD-10-CM | POA: Diagnosis not present

## 2019-06-12 DIAGNOSIS — R05 Cough: Secondary | ICD-10-CM | POA: Insufficient documentation

## 2019-06-12 DIAGNOSIS — I739 Peripheral vascular disease, unspecified: Secondary | ICD-10-CM | POA: Insufficient documentation

## 2019-06-12 DIAGNOSIS — Z20828 Contact with and (suspected) exposure to other viral communicable diseases: Secondary | ICD-10-CM | POA: Diagnosis not present

## 2019-06-12 DIAGNOSIS — Z7902 Long term (current) use of antithrombotics/antiplatelets: Secondary | ICD-10-CM | POA: Diagnosis not present

## 2019-06-12 DIAGNOSIS — E7800 Pure hypercholesterolemia, unspecified: Secondary | ICD-10-CM | POA: Diagnosis not present

## 2019-06-12 DIAGNOSIS — Z8673 Personal history of transient ischemic attack (TIA), and cerebral infarction without residual deficits: Secondary | ICD-10-CM | POA: Insufficient documentation

## 2019-06-12 DIAGNOSIS — E1122 Type 2 diabetes mellitus with diabetic chronic kidney disease: Secondary | ICD-10-CM | POA: Diagnosis not present

## 2019-06-12 DIAGNOSIS — Z951 Presence of aortocoronary bypass graft: Secondary | ICD-10-CM | POA: Insufficient documentation

## 2019-06-12 DIAGNOSIS — Z87891 Personal history of nicotine dependence: Secondary | ICD-10-CM | POA: Insufficient documentation

## 2019-06-12 MED ORDER — CETIRIZINE HCL 10 MG PO TABS: 10.0000 mg | ORAL_TABLET | Freq: Every day | ORAL | 0 refills | Status: DC

## 2019-06-12 MED ORDER — BENZONATATE 100 MG PO CAPS: 100.0000 mg | ORAL_CAPSULE | Freq: Three times a day (TID) | ORAL | 0 refills | Status: DC

## 2019-06-12 NOTE — Discharge Instructions (Addendum)
I believe this is viral illness Keep taking the mucinex Tessalon pearls for cough.  Keep using the Flonase and we will add zyrtec.  If your symptoms are not improving or worse by the weekend give [place] a call.  We will call you if your COVID test is positive.

## 2019-06-12 NOTE — Progress Notes (Signed)
Patient ID: Brenda Moreno, female   DOB: 1946/04/24, 73 y.o.   MRN: [id number]  Pt no answer on video or phone

## 2019-06-12 NOTE — ED Triage Notes (Signed)
Pt states she is feeling weak x 4 days. Pt has cough and nasal congestion x 4 days.

## 2019-06-12 NOTE — ED Provider Notes (Signed)
[place]    CSN: [id number] Arrival date & time: 06/12/19  1016      History   Chief Complaint Chief Complaint  Patient presents with  . Cough  . Nasal Congestion  . Weakness    HPI Brenda Moreno is a 73 y.o. female.   Patient is a 73 year old female with past medical history of arthritis, CAD, cataract, CHF, CKD, diabetes, hyperlipidemia, hypertension, MI, PVD, stroke.  She presents today with approximate 5 days of cough, congestion, rhinorrhea.  Coughing up small amounts of mucus.  No associated fever, chills or body aches.  She has been taking over-the-counter Alka-Seltzer, Flonase and cough medication.  This is helped slightly with her symptoms.  Also reporting that 53-year-old grandson recently sick with similar symptoms.  No known COVID exposure.no chest pain, SOB.   ROS per HPI      Past Medical History:  Diagnosis Date  . Arthritis   . CAD (coronary artery disease)    S/P cabg  . Carotid artery occlusion   . Cataract   . CHF (congestive heart failure) ([place])   . CKD (chronic kidney disease) 12/21/2018  . DDD (degenerative disc disease), lumbar   . Diabetes mellitus   . Diabetic retinopathy ([place])    NPDR OU  . Hyperlipidemia   . Hypertension   . Hypertensive retinopathy    OU  . Joint pain   . Leg pain   . Myocardial infarction ([place]) 1998  . Peripheral vascular disease ([place])   . PVD (peripheral vascular disease) ([place])   . Stroke [facility])     Patient Active Problem List   Diagnosis Date Noted  . Allergic rhinitis 12/21/2018  . CKD (chronic kidney disease) 12/21/2018  . Encounter for well adult exam with abnormal findings 12/21/2018  . Sciatica, left side 12/21/2018  . Acute CHF ([place]) 08/11/2018  . Hx of completed stroke 08/11/2018  . Acute on chronic diastolic CHF (congestive heart failure) ([place]) 08/11/2018  . Anemia 08/11/2018  . Sinus bradycardia 08/11/2018  . Acute on chronic diastolic (congestive) heart failure ([place]) 08/11/2018  .  CVA (cerebral vascular accident) ([place]) 01/16/2018  . Chest pain 12/22/2017  . Coronary artery disease involving native coronary artery of native heart without angina pectoris 06/02/2016  . DDD (degenerative disc disease), lumbar   . Diarrhea 07/02/2014  . Fecal incontinence 07/02/2014  . Aftercare following surgery of the circulatory system, [place] 09/26/2013  . 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 Acute sinus infection 07/14/2013  . Arthritis   . Chronic diastolic heart failure ([place])   . Diabetes mellitus, type II, insulin dependent ([place])   . Hyperlipidemia   . Hypertension   . Myocardial infarction ([place])   . PVD (peripheral vascular disease) ([place])   . Peripheral vascular disease, unspecified ([place]) 09/27/2012  . Leg cramps 09/27/2012  . Atherosclerosis of native arteries of the extremities with intermittent claudication 09/22/2011  . Diabetes mellitus ([place]) 11/22/2007  . HYPERCHOLESTEROLEMIA 11/22/2007  . Benign essential HTN 11/22/2007  . HEART ATTACK 11/22/2007  . CAD (coronary artery disease) 11/22/2007  . RENAL CALCULUS 11/22/2007    Past Surgical History:  Procedure Laterality Date  . APPENDECTOMY    . BREAST EXCISIONAL BIOPSY Left 2008  . CHOLECYSTECTOMY     Gall bladder  . COLONOSCOPY  2008  . CORONARY ARTERY BYPASS GRAFT     1998  . LEFT HEART CATH AND CORS/GRAFTS ANGIOGRAPHY N/A 12/22/2017   Procedure: LEFT HEART CATH AND CORS/GRAFTS  ANGIOGRAPHY;  Surgeon: Burnell Blanks, MD;  Location: [facility];  Service: Cardiovascular;  Laterality: N/A;  . PR VEIN BYPASS GRAFT,AORTO-FEM-POP    . TEE WITHOUT CARDIOVERSION N/A 01/19/2018   Procedure: TRANSESOPHAGEAL ECHOCARDIOGRAM (TEE);  Surgeon: Josue Hector, MD;  Location: [facility] ENDOSCOPY;  Service: Cardiovascular;  Laterality: N/A;  . TUBAL LIGATION      OB History   No obstetric history on file.      Home Medications    Prior to Admission medications   Medication Sig Start  Date End Date Taking? Authorizing Provider  amLODipine (NORVASC) 5 MG tablet Take 1 tablet (5 mg total) by mouth daily. 12/31/18   Biagio Borg, MD  benzonatate (TESSALON) 100 MG capsule Take 1 capsule (100 mg total) by mouth every 8 (eight) hours. 06/12/19   Loura Halt A, NP  cetirizine (ZYRTEC) 10 MG tablet Take 1 tablet (10 mg total) by mouth daily. 06/12/19   Nikolus Marczak, Tressia Miners A, NP  CINNAMON PO Take 1,000 mg by mouth 2 (two) times daily.      [provider]  clopidogrel (PLAVIX) 75 MG tablet TAKE 1 TABLET BY MOUTH EVERY DAY 02/01/19   Susy Frizzle, MD  clotrimazole-betamethasone (LOTRISONE) cream Apply 1 application topically 2 (two) times daily. 10/12/18   Susy Frizzle, MD  Coenzyme Q10 (CO Q 10 PO) Take 100 mg by mouth daily.      [provider]  doxazosin (CARDURA) 2 MG tablet TAKE 1 TABLET BY MOUTH EVERY DAY 02/01/19   Susy Frizzle, MD  fluticasone [facility]) 50 MCG/ACT nasal spray Place 2 sprays into both nostrils daily as needed for allergies. 01/14/18   [provider]  furosemide (LASIX) 20 MG tablet TAKE 1 TABLET BY MOUTH DAILY AS NEEDED FOR EDEMA 03/27/19   Biagio Borg, MD  glipiZIDE (GLUCOTROL XL) 2.5 MG 24 hr tablet Take 1 tablet (2.5 mg total) by mouth daily with breakfast. 12/24/18   Biagio Borg, MD  glucose blood (ACCU-CHEK GUIDE) test strip Use to test blood sugar up to 3 time a day E11.9 05/22/19   Biagio Borg, MD  Insulin Isophane & Regular Human (HUMULIN 70/30 KWIKPEN) (70-30) 100 UNIT/ML PEN 15 units with breakfast and 7 units with supper Patient taking differently: Inject 7-15 Units into the skin See admin instructions. Take 15 units in the morning and 7 units at night 05/10/18   Dixon, Stanton Kidney B, PA-C  JARDIANCE 25 MG TABS tablet TAKE 1 TABLET BY MOUTH EVERY DAY 05/07/19   Susy Frizzle, MD  LIVALO 4 MG TABS TAKE 1 TABLET BY MOUTH EVERY DAY 03/28/19   Susy Frizzle, MD  metFORMIN (GLUCOPHAGE) 1000 MG tablet TAKE 1 TABLET (1,000 MG TOTAL)  BY MOUTH 2 TIMES DAILY WITH A MEAL. 06/10/19   Susy Frizzle, MD  nitroGLYCERIN (NITROSTAT) 0.4 MG SL tablet Place 1 tablet (0.4 mg total) under the tongue every 5 (five) minutes as needed for chest pain. 11/12/14   Susy Frizzle, MD  potassium chloride (KLOR-CON) 10 MEQ tablet Take 20 mEq by mouth daily. 04/10/19   [provider]  quinapril (ACCUPRIL) 40 MG tablet Take 1 tablet (40 mg total) by mouth 2 (two) times daily. 02/11/19   Biagio Borg, MD  traMADol (ULTRAM) 50 MG tablet Take 1 tablet (50 mg total) by mouth every 6 (six) hours as needed. 04/16/19   Wieters, Elesa Hacker, PA-C    Family History Family History  Problem  Relation Age of Onset  . Heart disease Mother   . Diabetes Mother   . Hyperlipidemia Mother   . Hypertension Mother   . Stroke Mother   . Heart disease Father        Heart Disease before age 49  . Diabetes Father   . Hyperlipidemia Father   . Hypertension Father   . Heart disease Sister        heart attack  . Diabetes Sister        Amputation  . Hypertension Sister   . Other Sister        history of amputation  . Heart attack Sister   . Diabetes Brother   . Hypertension Brother   . Heart disease Brother 2       Before age 59  . Hyperlipidemia Brother   . Diabetes Brother        scirrosis of liver  . Coronary artery disease Other   . Colon cancer Neg Hx   . Stomach cancer Neg Hx   . Esophageal cancer Neg Hx     Social History Social History   Tobacco Use  . Smoking status: Former Smoker    Packs/day: 0.25    Types: Cigarettes    Quit date: 09/13/2011    Years since quitting: 7.7  . Smokeless tobacco: Never Used  Substance Use Topics  . Alcohol use: No  . Drug use: No     Allergies   Levaquin [levofloxacin in d5w], Statins, and Tradjenta [linagliptin]   Review of Systems Review of Systems   Physical Exam Triage Vital Signs ED Triage Vitals  Enc Vitals Group     BP 06/12/19 1147 (!) 149/57     Pulse Rate 06/12/19  1147 60     Resp 06/12/19 1147 16     Temp 06/12/19 1147 97.6 F (36.4 C)     Temp Source 06/12/19 1147 Temporal     SpO2 06/12/19 1147 100 %     Weight --      Height --      Head Circumference --      Peak Flow --      Pain Score 06/12/19 1145 0     Pain Loc --      Pain Edu? --      Excl. in [place]? --    No data found.  Updated Vital Signs BP (!) 149/57 (BP Location: Right Arm)   Pulse 60   Temp 97.6 F (36.4 C) (Temporal)   Resp 16   SpO2 100%   Visual Acuity Right Eye Distance:   Left Eye Distance:   Bilateral Distance:    Right Eye Near:   Left Eye Near:    Bilateral Near:     Physical Exam Vitals signs and nursing note reviewed.  Constitutional:      General: She is not in acute distress.    Appearance: Normal appearance. She is well-developed. She is not ill-appearing, toxic-appearing or diaphoretic.  HENT:     Head: Normocephalic and atraumatic.     Right Ear: Tympanic membrane and ear canal normal.     Left Ear: Tympanic membrane and ear canal normal.     Nose: Congestion and rhinorrhea present.     Mouth/Throat:     Pharynx: Oropharynx is clear.  Eyes:     Conjunctiva/sclera: Conjunctivae normal.  Neck:     Musculoskeletal: Neck supple.  Cardiovascular:     Rate and Rhythm: Normal rate and regular rhythm.  Heart sounds: No murmur.  Pulmonary:     Effort: Pulmonary effort is normal. No respiratory distress.     Breath sounds: Normal breath sounds.  Abdominal:     Palpations: Abdomen is soft.     Tenderness: There is no abdominal tenderness.  Musculoskeletal: Normal range of motion.  Skin:    General: Skin is warm and dry.  Neurological:     Mental Status: She is alert.  Psychiatric:        Mood and Affect: Mood normal.      UC Treatments / Results  Labs (all labs ordered are listed, but only abnormal results are displayed) Labs Reviewed  NOVEL CORONAVIRUS, NAA (HOSP ORDER, SEND-OUT TO REF LAB; TAT 18-24 HRS)    EKG   Radiology  No results found.  Procedures Procedures (including critical care time)  Medications Ordered in UC Medications - No data to display  Initial Impression / Assessment and Plan / UC Course  I have reviewed the triage vital signs and the nursing notes.  Pertinent labs & imaging results that were available during my care of the patient were reviewed by me and considered in my medical decision making (see chart for details).     Viral URI-lungs clear on exam.  Patient is nontoxic or ill-appearing.  Vital signs stable. Prescribing Tessalon Perles for cough.  We will have her continue the Mucinex and Flonase.  We will add Zyrtec.  If symptoms worsen over the next 3 to 4 days she will need to follow-up. COVID test pending.  Final Clinical Impressions(s) / UC Diagnoses   Final diagnoses:  Viral URI with cough     Discharge Instructions     I believe this is viral illness Keep taking the mucinex Tessalon pearls for cough.  Keep using the Flonase and we will add zyrtec.  If your symptoms are not improving or worse by the weekend give [place] a call.  We will call you if your COVID test is positive.     ED Prescriptions    Medication Sig Dispense Auth. Provider   benzonatate (TESSALON) 100 MG capsule Take 1 capsule (100 mg total) by mouth every 8 (eight) hours. 21 capsule Quintell Bonnin A, NP   cetirizine (ZYRTEC) 10 MG tablet Take 1 tablet (10 mg total) by mouth daily. 30 tablet Loura Halt A, NP     PDMP not reviewed this encounter.   Orvan July, NP 06/12/19 1249

## 2019-06-16 ENCOUNTER — Encounter: Payer: Self-pay | Admitting: Internal Medicine

## 2019-06-16 LAB — NOVEL CORONAVIRUS, NAA (HOSP ORDER, SEND-OUT TO REF LAB; TAT 18-24 HRS): SARS-CoV-2, NAA: NOT DETECTED

## 2019-06-16 NOTE — Patient Instructions (Signed)
none

## 2019-06-20 ENCOUNTER — Other Ambulatory Visit: Payer: Self-pay

## 2019-06-20 ENCOUNTER — Ambulatory Visit (INDEPENDENT_AMBULATORY_CARE_PROVIDER_SITE_OTHER): Payer: Medicare Other | Admitting: Podiatry

## 2019-06-20 DIAGNOSIS — S93491A Sprain of other ligament of right ankle, initial encounter: Secondary | ICD-10-CM

## 2019-06-20 DIAGNOSIS — S93492A Sprain of other ligament of left ankle, initial encounter: Secondary | ICD-10-CM

## 2019-06-21 ENCOUNTER — Encounter: Payer: Self-pay | Admitting: Podiatry

## 2019-06-21 NOTE — Progress Notes (Signed)
Subjective:  Patient ID: Brenda Moreno, female    DOB: 1946-05-29,  MRN: [id number]  Chief Complaint  Patient presents with  . Foot Problem    Bilateral ankle swelling 20 year duration, no pain, not states it began with diabeties diagnosis    73 y.o. female presents with the above complaint.  She states that the swelling has been going on for a year.  She has a extensive past medical history including cardiac issues.  She states that she wears compression stockings which does help with swelling.  However the swelling never tends to go away.  And it hurts when she is ambulating.  She denies any other acute complaint she wears tennis shoes.  Denies any nausea fever chills vomiting   Review of Systems: Negative except as noted in the HPI. Denies N/V/F/Ch.  Past Medical History:  Diagnosis Date  . Arthritis   . CAD (coronary artery disease)    S/P cabg  . Carotid artery occlusion   . Cataract   . CHF (congestive heart failure) ([place])   . CKD (chronic kidney disease) 12/21/2018  . DDD (degenerative disc disease), lumbar   . Diabetes mellitus   . Diabetic retinopathy ([place])    NPDR OU  . Hyperlipidemia   . Hypertension   . Hypertensive retinopathy    OU  . Joint pain   . Leg pain   . Myocardial infarction ([place]) 1998  . Peripheral vascular disease ([place])   . PVD (peripheral vascular disease) ([place])   . Stroke [facility])     Current Outpatient Medications:  .  amLODipine (NORVASC) 5 MG tablet, Take 1 tablet (5 mg total) by mouth daily., Disp: 90 tablet, Rfl: 1 .  benzonatate (TESSALON) 100 MG capsule, Take 1 capsule (100 mg total) by mouth every 8 (eight) hours., Disp: 21 capsule, Rfl: 0 .  cetirizine (ZYRTEC) 10 MG tablet, Take 1 tablet (10 mg total) by mouth daily., Disp: 30 tablet, Rfl: 0 .  CINNAMON PO, Take 1,000 mg by mouth 2 (two) times daily.  , Disp: , Rfl:  .  clopidogrel (PLAVIX) 75 MG tablet, TAKE 1 TABLET BY MOUTH EVERY DAY, Disp: 90 tablet, Rfl: 3 .   clotrimazole-betamethasone (LOTRISONE) cream, Apply 1 application topically 2 (two) times daily., Disp: 30 g, Rfl: 0 .  Coenzyme Q10 (CO Q 10 PO), Take 100 mg by mouth daily.  , Disp: , Rfl:  .  doxazosin (CARDURA) 2 MG tablet, TAKE 1 TABLET BY MOUTH EVERY DAY, Disp: 90 tablet, Rfl: 0 .  fluticasone (FLONASE) 50 MCG/ACT nasal spray, Place 2 sprays into both nostrils daily as needed for allergies., Disp: , Rfl:  .  furosemide (LASIX) 20 MG tablet, TAKE 1 TABLET BY MOUTH DAILY AS NEEDED FOR EDEMA, Disp: 90 tablet, Rfl: 1 .  glipiZIDE (GLUCOTROL XL) 2.5 MG 24 hr tablet, Take 1 tablet (2.5 mg total) by mouth daily with breakfast., Disp: 90 tablet, Rfl: 3 .  glucose blood (ACCU-CHEK GUIDE) test strip, Use to test blood sugar up to 3 time a day E11.9, Disp: 100 each, Rfl: 11 .  Insulin Isophane & Regular Human (HUMULIN 70/30 KWIKPEN) (70-30) 100 UNIT/ML PEN, 15 units with breakfast and 7 units with supper (Patient taking differently: Inject 7-15 Units into the skin See admin instructions. Take 15 units in the morning and 7 units at night), Disp: 15 mL, Rfl: 11 .  JARDIANCE 25 MG TABS tablet, TAKE 1 TABLET BY MOUTH EVERY DAY, Disp: 30 tablet, Rfl: 5 .  LIVALO 4 MG TABS, TAKE 1 TABLET BY MOUTH EVERY DAY, Disp: 90 tablet, Rfl: 1 .  metFORMIN (GLUCOPHAGE) 1000 MG tablet, TAKE 1 TABLET (1,000 MG TOTAL) BY MOUTH 2 TIMES DAILY WITH A MEAL., Disp: 180 tablet, Rfl: 0 .  nitroGLYCERIN (NITROSTAT) 0.4 MG SL tablet, Place 1 tablet (0.4 mg total) under the tongue every 5 (five) minutes as needed for chest pain., Disp: 90 tablet, Rfl: 2 .  potassium chloride (KLOR-CON) 10 MEQ tablet, Take 20 mEq by mouth daily., Disp: , Rfl:  .  quinapril (ACCUPRIL) 40 MG tablet, Take 1 tablet (40 mg total) by mouth 2 (two) times daily., Disp: 180 tablet, Rfl: 3 .  traMADol (ULTRAM) 50 MG tablet, Take 1 tablet (50 mg total) by mouth every 6 (six) hours as needed., Disp: 10 tablet, Rfl: 0  Social History   Tobacco Use  Smoking Status  Former Smoker  . Packs/day: 0.25  . Types: Cigarettes  . Quit date: 09/13/2011  . Years since quitting: 7.7  Smokeless Tobacco Never Used    Allergies  Allergen Reactions  . Levaquin [Levofloxacin In D5w] Other (See Comments)    Pain all over and in joints  . Statins Other (See Comments)    Severe myalgias to lipitor, crestor, pravastatin and weakness and cramping  in bilateral leg.  [name] [Linagliptin] Itching   Objective:  There were no vitals filed for this visit. There is no height or weight on file to calculate BMI. Constitutional Well developed. Well nourished.  Vascular Dorsalis pedis pulses palpable bilaterally. Posterior tibial pulses palpable bilaterally. Capillary refill normal to all digits.  No cyanosis or clubbing noted. Pedal hair growth normal.  Neurologic Normal speech. Oriented to person, place, and time. Epicritic sensation to light touch grossly present bilaterally.  Dermatologic Nails well groomed and normal in appearance. No open wounds. No skin lesions.  Orthopedic:  Pain on palpation to the ATFL bilaterally.  No anterior ankle joint pain no deep joint pain no pain on palpation to the Achilles tendon no pain on palpation to the medial compartment including the posterior tibial tendon and no pain on palpation at the peroneal tendon all of these findings were bilaterally.   Radiographs: None Assessment:   1. Sprain of anterior talofibular ligament of left ankle, initial encounter   2. Sprain of anterior talofibular ligament of right ankle, initial encounter    Plan:  Patient was evaluated and treated and all questions answered.  Bilateral chronic ankle sprain with associated chronic ankle instability -I explained to the patient the etiology and all the various treatment options associated with treating chronic ankle instability. -2 Tri-Lock braces were dispensed.  This will help support the ankle joint especially while she is ambulating  -Edema  to bilateral ankles -I explained to her that this could be partly related to the cardiac history. -I encouraged her to continue wearing compression stockings to help get the swelling out.  No follow-ups on file.

## 2019-06-25 ENCOUNTER — Ambulatory Visit (INDEPENDENT_AMBULATORY_CARE_PROVIDER_SITE_OTHER): Payer: Medicare Other | Admitting: Internal Medicine

## 2019-06-25 ENCOUNTER — Other Ambulatory Visit: Payer: Self-pay

## 2019-06-25 ENCOUNTER — Encounter: Payer: Self-pay | Admitting: Internal Medicine

## 2019-06-25 ENCOUNTER — Other Ambulatory Visit (INDEPENDENT_AMBULATORY_CARE_PROVIDER_SITE_OTHER): Payer: Medicare Other

## 2019-06-25 ENCOUNTER — Telehealth: Payer: Self-pay | Admitting: Internal Medicine

## 2019-06-25 VITALS — BP 118/82 | HR 62 | Temp 98.5°F | Ht 66.0 in | Wt 162.0 lb

## 2019-06-25 DIAGNOSIS — E119 Type 2 diabetes mellitus without complications: Secondary | ICD-10-CM

## 2019-06-25 DIAGNOSIS — E7800 Pure hypercholesterolemia, unspecified: Secondary | ICD-10-CM | POA: Diagnosis not present

## 2019-06-25 DIAGNOSIS — E559 Vitamin D deficiency, unspecified: Secondary | ICD-10-CM | POA: Diagnosis not present

## 2019-06-25 DIAGNOSIS — E538 Deficiency of other specified B group vitamins: Secondary | ICD-10-CM

## 2019-06-25 DIAGNOSIS — Z23 Encounter for immunization: Secondary | ICD-10-CM | POA: Diagnosis not present

## 2019-06-25 DIAGNOSIS — E611 Iron deficiency: Secondary | ICD-10-CM

## 2019-06-25 DIAGNOSIS — I10 Essential (primary) hypertension: Secondary | ICD-10-CM

## 2019-06-25 DIAGNOSIS — Z0000 Encounter for general adult medical examination without abnormal findings: Secondary | ICD-10-CM

## 2019-06-25 DIAGNOSIS — Z794 Long term (current) use of insulin: Secondary | ICD-10-CM | POA: Diagnosis not present

## 2019-06-25 LAB — LIPID PANEL
Cholesterol: 200 mg/dL (ref 0–200)
HDL: 48.9 mg/dL (ref >39.00)
LDL Cholesterol: 118 mg/dL — ABNORMAL HIGH (ref 0–99)
NonHDL: 150.63
Total CHOL/HDL Ratio: 4
Triglycerides: 163 mg/dL — ABNORMAL HIGH (ref 0.0–149.0)
VLDL: 32.6 mg/dL (ref 0.0–40.0)

## 2019-06-25 LAB — IBC PANEL
Iron: 101 ug/dL (ref 42–145)
Saturation Ratios: 31.4 % (ref 20.0–50.0)
Transferrin: 230 mg/dL (ref 212.0–360.0)

## 2019-06-25 LAB — HEPATIC FUNCTION PANEL
ALT: 11 U/L (ref 0–35)
AST: 13 U/L (ref 0–37)
Albumin: 4.2 g/dL (ref 3.5–5.2)
Alkaline Phosphatase: 111 U/L (ref 39–117)
Bilirubin, Direct: 0.1 mg/dL (ref 0.0–0.3)
Total Bilirubin: 0.5 mg/dL (ref 0.2–1.2)
Total Protein: 7.3 g/dL (ref 6.0–8.3)

## 2019-06-25 LAB — BASIC METABOLIC PANEL
BUN: 27 mg/dL — ABNORMAL HIGH (ref 6–23)
CO2: 25 mEq/L (ref 19–32)
Calcium: 9.7 mg/dL (ref 8.4–10.5)
Chloride: 108 mEq/L (ref 96–112)
Creatinine, Ser: 1.27 mg/dL — ABNORMAL HIGH (ref 0.40–1.20)
GFR: 50.14 mL/min — ABNORMAL LOW (ref >60.00)
Glucose, Bld: 204 mg/dL — ABNORMAL HIGH (ref 70–99)
Potassium: 4 mEq/L (ref 3.5–5.1)
Sodium: 142 mEq/L (ref 135–145)

## 2019-06-25 LAB — HEMOGLOBIN A1C: Hgb A1c MFr Bld: 8.3 % — ABNORMAL HIGH (ref 4.6–6.5)

## 2019-06-25 MED ORDER — ACCU-CHEK GUIDE VI STRP: ORAL_STRIP | 11 refills | Status: DC

## 2019-06-25 NOTE — Progress Notes (Signed)
Subjective:    Patient ID: Brenda Moreno, female    DOB: 1946-05-28, 73 y.o.   MRN: [id number]  HPI  Here to f/u; overall doing ok,  Pt denies chest pain, increasing sob or doe, wheezing, orthopnea, PND, increased LE swelling, palpitations, dizziness or syncope.  Pt denies new neurological symptoms such as new headache, or facial or extremity weakness or numbness.  Pt denies polydipsia, polyuria, or low sugar episode.  Pt states overall good compliance with meds, mostly trying to follow appropriate diet, with wt overall stable,  but little exercise however. Recent URI resolved, did try to tx with bottle of bourbon and seemed to work, though she doesn't like the taste.   No other new complaints Past Medical History:  Diagnosis Date  . Arthritis   . CAD (coronary artery disease)    S/P cabg  . Carotid artery occlusion   . Cataract   . CHF (congestive heart failure) ([place])   . CKD (chronic kidney disease) 12/21/2018  . DDD (degenerative disc disease), lumbar   . Diabetes mellitus   . Diabetic retinopathy ([place])    NPDR OU  . Hyperlipidemia   . Hypertension   . Hypertensive retinopathy    OU  . Joint pain   . Leg pain   . Myocardial infarction ([place]) 1998  . Peripheral vascular disease ([place])   . PVD (peripheral vascular disease) ([place])   . Stroke [facility])    Past Surgical History:  Procedure Laterality Date  . APPENDECTOMY    . BREAST EXCISIONAL BIOPSY Left 2008  . CHOLECYSTECTOMY     Gall bladder  . COLONOSCOPY  2008  . CORONARY ARTERY BYPASS GRAFT     1998  . LEFT HEART CATH AND CORS/GRAFTS ANGIOGRAPHY N/A 12/22/2017   Procedure: LEFT HEART CATH AND CORS/GRAFTS ANGIOGRAPHY;  Surgeon: Burnell Blanks, MD;  Location: [facility];  Service: Cardiovascular;  Laterality: N/A;  . PR VEIN BYPASS GRAFT,AORTO-FEM-POP    . TEE WITHOUT CARDIOVERSION N/A 01/19/2018   Procedure: TRANSESOPHAGEAL ECHOCARDIOGRAM (TEE);  Surgeon: Josue Hector, MD;  Location: [facility] ENDOSCOPY;  Service:  Cardiovascular;  Laterality: N/A;  . TUBAL LIGATION      reports that she quit smoking about 7 years ago. Her smoking use included cigarettes. She smoked 0.25 packs per day. She has never used smokeless tobacco. She reports that she does not drink alcohol or use drugs. family history includes Coronary artery disease in an other family member; Diabetes in her brother, brother, father, mother, and sister; Heart attack in her sister; Heart disease in her father, mother, and sister; Heart disease (age of onset: 18) in her brother; Hyperlipidemia in her brother, father, and mother; Hypertension in her brother, father, mother, and sister; Other in her sister; Stroke in her mother. Allergies  Allergen Reactions  . Levaquin [Levofloxacin In D5w] Other (See Comments)    Pain all over and in joints  . Statins Other (See Comments)    Severe myalgias to lipitor, crestor, pravastatin and weakness and cramping  in bilateral leg.  [name] [Linagliptin] Itching   Current Outpatient Medications on File Prior to Visit  Medication Sig Dispense Refill  . amLODipine (NORVASC) 5 MG tablet Take 1 tablet (5 mg total) by mouth daily. 90 tablet 1  . benzonatate (TESSALON) 100 MG capsule Take 1 capsule (100 mg total) by mouth every 8 (eight) hours. 21 capsule 0  . cetirizine (ZYRTEC) 10 MG tablet Take 1 tablet (10 mg total) by mouth  daily. 30 tablet 0  . CINNAMON PO Take 1,000 mg by mouth 2 (two) times daily.      . clopidogrel (PLAVIX) 75 MG tablet TAKE 1 TABLET BY MOUTH EVERY DAY 90 tablet 3  . clotrimazole-betamethasone (LOTRISONE) cream Apply 1 application topically 2 (two) times daily. 30 g 0  . Coenzyme Q10 (CO Q 10 PO) Take 100 mg by mouth daily.      Marland Kitchen doxazosin (CARDURA) 2 MG tablet TAKE 1 TABLET BY MOUTH EVERY DAY 90 tablet 0  . fluticasone (FLONASE) 50 MCG/ACT nasal spray Place 2 sprays into both nostrils daily as needed for allergies.    . furosemide (LASIX) 20 MG tablet TAKE 1 TABLET BY MOUTH DAILY AS  NEEDED FOR EDEMA 90 tablet 1  . glipiZIDE (GLUCOTROL XL) 2.5 MG 24 hr tablet Take 1 tablet (2.5 mg total) by mouth daily with breakfast. 90 tablet 3  . Insulin Isophane & Regular Human (HUMULIN 70/30 KWIKPEN) (70-30) 100 UNIT/ML PEN 15 units with breakfast and 7 units with supper (Patient taking differently: Inject 7-15 Units into the skin See admin instructions. Take 15 units in the morning and 7 units at night) 15 mL 11  . JARDIANCE 25 MG TABS tablet TAKE 1 TABLET BY MOUTH EVERY DAY 30 tablet 5  . LIVALO 4 MG TABS TAKE 1 TABLET BY MOUTH EVERY DAY 90 tablet 1  . metFORMIN (GLUCOPHAGE) 1000 MG tablet TAKE 1 TABLET (1,000 MG TOTAL) BY MOUTH 2 TIMES DAILY WITH A MEAL. 180 tablet 0  . nitroGLYCERIN (NITROSTAT) 0.4 MG SL tablet Place 1 tablet (0.4 mg total) under the tongue every 5 (five) minutes as needed for chest pain. 90 tablet 2  . potassium chloride (KLOR-CON) 10 MEQ tablet Take 20 mEq by mouth daily.    . quinapril (ACCUPRIL) 40 MG tablet Take 1 tablet (40 mg total) by mouth 2 (two) times daily. 180 tablet 3  . traMADol (ULTRAM) 50 MG tablet Take 1 tablet (50 mg total) by mouth every 6 (six) hours as needed. 10 tablet 0   No current facility-administered medications on file prior to visit.    Review of Systems  Constitutional: Negative for other unusual diaphoresis or sweats HENT: Negative for ear discharge or swelling Eyes: Negative for other worsening visual disturbances Respiratory: Negative for stridor or other swelling  Gastrointestinal: Negative for worsening distension or other blood Genitourinary: Negative for retention or other urinary change Musculoskeletal: Negative for other MSK pain or swelling Skin: Negative for color change or other new lesions Neurological: Negative for worsening tremors and other numbness  Psychiatric/Behavioral: Negative for worsening agitation or other fatigue All otherwise neg per pt    Objective:   Physical Exam BP 118/82   Pulse 62   Temp 98.5  F (36.9 C) (Oral)   Ht 5\' 6"  (1.676 m)   Wt 162 lb (73.5 kg)   SpO2 98%   BMI 26.15 kg/m  VS noted,  Constitutional: Pt appears in NAD HENT: Head: NCAT.  Right Ear: External ear normal.  Left Ear: External ear normal.  Eyes: . Pupils are equal, round, and reactive to light. Conjunctivae and EOM are normal Nose: without d/c or deformity Neck: Neck supple. Gross normal ROM Cardiovascular: Normal rate and regular rhythm.   Pulmonary/Chest: Effort normal and breath sounds without rales or wheezing.  Abd:  Soft, NT, ND, + BS, no organomegaly Neurological: Pt is alert. At baseline orientation, motor grossly intact Skin: Skin is warm. No rashes, other new lesions, no  LE edema Psychiatric: Pt behavior is normal without agitation  All otherwise neg per pt Lab Results  Component Value Date   WBC 7.3 12/21/2018   HGB 14.2 12/21/2018   HCT 42.4 12/21/2018   PLT 193.0 12/21/2018   GLUCOSE 179 (H) 12/21/2018   CHOL 147 12/21/2018   TRIG 106.0 12/21/2018   HDL 45.20 12/21/2018   LDLCALC 80 12/21/2018   ALT 10 12/21/2018   AST 13 12/21/2018   NA 141 12/21/2018   K 4.2 12/21/2018   CL 109 12/21/2018   CREATININE 1.18 12/21/2018   BUN 27 (H) 12/21/2018   CO2 23 12/21/2018   TSH 1.19 12/21/2018   INR 0.88 01/16/2018   HGBA1C 8.4 (H) 12/21/2018   MICROALBUR 18.0 (H) 12/21/2018        Assessment & Plan:

## 2019-06-25 NOTE — Patient Instructions (Signed)
You had the flu shot today  Your recent COVID test was negative  Please continue all other medications as before, and refills have been done if requested - the strips  Please have the pharmacy call with any other refills you may need.  Please continue your efforts at being more active, low cholesterol diet, and weight control.  Please keep your appointments with your specialists as you may have planned - vascular and podiatry  Please go to the LAB in the Basement (turn left off the elevator) for the tests to be done today  You will be contacted by phone if any changes need to be made immediately.  Otherwise, you will receive a letter about your results with an explanation, but please check with MyChart first.  Please remember to sign up for MyChart if you have not done so, as this will be important to you in the future with finding out test results, communicating by private email, and scheduling acute appointments online when needed.  Please return in 6 months, or sooner if needed, with Lab testing done 3-5 days before

## 2019-06-25 NOTE — Telephone Encounter (Signed)
They were sent during her visit.

## 2019-06-25 NOTE — Telephone Encounter (Signed)
Patient is requesting test strips for aquachek guide to be sent to [facility].

## 2019-06-26 ENCOUNTER — Encounter: Payer: Self-pay | Admitting: Internal Medicine

## 2019-06-26 NOTE — Assessment & Plan Note (Signed)
stable overall by history and exam, recent data reviewed with pt, and pt to continue medical treatment as before,  to f/u any worsening symptoms or concerns  

## 2019-06-27 ENCOUNTER — Other Ambulatory Visit: Payer: Medicare Other

## 2019-06-27 DIAGNOSIS — E611 Iron deficiency: Secondary | ICD-10-CM

## 2019-06-27 LAB — VITAMIN B12: Vitamin B-12: 217 pg/mL (ref 211–911)

## 2019-06-27 LAB — VITAMIN D 25 HYDROXY (VIT D DEFICIENCY, FRACTURES): VITD: 25.45 ng/mL — ABNORMAL LOW (ref 30.00–100.00)

## 2019-06-29 ENCOUNTER — Other Ambulatory Visit: Payer: Self-pay | Admitting: Internal Medicine

## 2019-06-29 ENCOUNTER — Encounter: Payer: Self-pay | Admitting: Internal Medicine

## 2019-06-29 MED ORDER — GLIPIZIDE ER 5 MG PO TB24: 5.0000 mg | ORAL_TABLET | Freq: Every day | ORAL | 3 refills | Status: DC

## 2019-06-29 MED ORDER — VITAMIN D (ERGOCALCIFEROL) 1.25 MG (50000 UNIT) PO CAPS: 50000.0000 [IU] | ORAL_CAPSULE | ORAL | 0 refills | Status: DC

## 2019-07-05 ENCOUNTER — Other Ambulatory Visit: Payer: Self-pay

## 2019-07-05 ENCOUNTER — Ambulatory Visit
Admission: RE | Admit: 2019-07-05 | Discharge: 2019-07-05 | Disposition: A | Discharge status: Home / Self Care | Payer: Medicare Other | Source: Ambulatory Visit | Attending: Internal Medicine | Admitting: Internal Medicine

## 2019-07-05 DIAGNOSIS — Z1231 Encounter for screening mammogram for malignant neoplasm of breast: Secondary | ICD-10-CM | POA: Diagnosis not present

## 2019-07-10 ENCOUNTER — Other Ambulatory Visit: Payer: Self-pay

## 2019-07-10 ENCOUNTER — Ambulatory Visit: Payer: Medicare Other | Admitting: Podiatry

## 2019-07-10 DIAGNOSIS — S93492A Sprain of other ligament of left ankle, initial encounter: Secondary | ICD-10-CM

## 2019-07-10 DIAGNOSIS — S93491A Sprain of other ligament of right ankle, initial encounter: Secondary | ICD-10-CM

## 2019-07-11 ENCOUNTER — Encounter: Payer: Self-pay | Admitting: Podiatry

## 2019-07-11 NOTE — Progress Notes (Signed)
Subjective:  Patient ID: Brenda Moreno, female    DOB: [birth year],  MRN: [id number]  Chief Complaint  Patient presents with  . Foot Problem    pt is here for bil ankle pain, pt states that her ankle pain has gotten better when she puts on the trilock braces    73 y.o. female presents with the above complaint.  Patient is here for a follow-up on bilateral ankle sprain of the ATFL ligament.  Patient states that she has considerably improved in terms of her pain her swelling has also gone down considerably.  She has been using her ankle brace.  She states that she was placing it the wrong way however patient was reeducated today on how to properly place the ankle brace on both of her ankle.  She denies any other acute complaints today.   Review of Systems: Negative except as noted in the HPI. Denies N/V/F/Ch.  Past Medical History:  Diagnosis Date  . Arthritis   . CAD (coronary artery disease)    S/P cabg  . Carotid artery occlusion   . Cataract   . CHF (congestive heart failure) ([place])   . CKD (chronic kidney disease) 12/21/2018  . DDD (degenerative disc disease), lumbar   . Diabetes mellitus   . Diabetic retinopathy ([place])    NPDR OU  . Hyperlipidemia   . Hypertension   . Hypertensive retinopathy    OU  . Joint pain   . Leg pain   . Myocardial infarction ([place]) 1998  . Peripheral vascular disease ([place])   . PVD (peripheral vascular disease) ([place])   . Stroke [facility])     Current Outpatient Medications:  .  amLODipine (NORVASC) 5 MG tablet, Take 1 tablet (5 mg total) by mouth daily., Disp: 90 tablet, Rfl: 1 .  benzonatate (TESSALON) 100 MG capsule, Take 1 capsule (100 mg total) by mouth every 8 (eight) hours., Disp: 21 capsule, Rfl: 0 .  cetirizine (ZYRTEC) 10 MG tablet, Take 1 tablet (10 mg total) by mouth daily., Disp: 30 tablet, Rfl: 0 .  CINNAMON PO, Take 1,000 mg by mouth 2 (two) times daily.  , Disp: , Rfl:  .  clopidogrel (PLAVIX) 75 MG tablet, TAKE 1 TABLET BY MOUTH EVERY  DAY, Disp: 90 tablet, Rfl: 3 .  clotrimazole-betamethasone (LOTRISONE) cream, Apply 1 application topically 2 (two) times daily., Disp: 30 g, Rfl: 0 .  Coenzyme Q10 (CO Q 10 PO), Take 100 mg by mouth daily.  , Disp: , Rfl:  .  doxazosin (CARDURA) 2 MG tablet, TAKE 1 TABLET BY MOUTH EVERY DAY, Disp: 90 tablet, Rfl: 0 .  fluticasone (FLONASE) 50 MCG/ACT nasal spray, Place 2 sprays into both nostrils daily as needed for allergies., Disp: , Rfl:  .  furosemide (LASIX) 20 MG tablet, TAKE 1 TABLET BY MOUTH DAILY AS NEEDED FOR EDEMA, Disp: 90 tablet, Rfl: 1 .  glipiZIDE (GLUCOTROL XL) 5 MG 24 hr tablet, Take 1 tablet (5 mg total) by mouth daily with breakfast., Disp: 90 tablet, Rfl: 3 .  glucose blood (ACCU-CHEK GUIDE) test strip, Use to test blood sugar up to 3 time a day E11.9, Disp: 100 each, Rfl: 11 .  Insulin Isophane & Regular Human (HUMULIN 70/30 KWIKPEN) (70-30) 100 UNIT/ML PEN, 15 units with breakfast and 7 units with supper (Patient taking differently: Inject 7-15 Units into the skin See admin instructions. Take 15 units in the morning and 7 units at night), Disp: 15 mL, Rfl: 11 .  JARDIANCE 25  MG TABS tablet, TAKE 1 TABLET BY MOUTH EVERY DAY, Disp: 30 tablet, Rfl: 5 .  LIVALO 4 MG TABS, TAKE 1 TABLET BY MOUTH EVERY DAY, Disp: 90 tablet, Rfl: 1 .  metFORMIN (GLUCOPHAGE) 1000 MG tablet, TAKE 1 TABLET (1,000 MG TOTAL) BY MOUTH 2 TIMES DAILY WITH A MEAL., Disp: 180 tablet, Rfl: 0 .  nitroGLYCERIN (NITROSTAT) 0.4 MG SL tablet, Place 1 tablet (0.4 mg total) under the tongue every 5 (five) minutes as needed for chest pain., Disp: 90 tablet, Rfl: 2 .  potassium chloride (KLOR-CON) 10 MEQ tablet, Take 20 mEq by mouth daily., Disp: , Rfl:  .  quinapril (ACCUPRIL) 40 MG tablet, Take 1 tablet (40 mg total) by mouth 2 (two) times daily., Disp: 180 tablet, Rfl: 3 .  traMADol (ULTRAM) 50 MG tablet, Take 1 tablet (50 mg total) by mouth every 6 (six) hours as needed., Disp: 10 tablet, Rfl: 0 .  Vitamin D,  Ergocalciferol, (DRISDOL) 1.25 MG (50000 UT) CAPS capsule, Take 1 capsule (50,000 Units total) by mouth every 7 (seven) days., Disp: 12 capsule, Rfl: 0  Social History   Tobacco Use  Smoking Status Former Smoker  . Packs/day: 0.25  . Types: Cigarettes  . Quit date: 09/13/2011  . Years since quitting: 7.8  Smokeless Tobacco Never Used    Allergies  Allergen Reactions  . Levaquin [Levofloxacin In D5w] Other (See Comments)    Pain all over and in joints  . Statins Other (See Comments)    Severe myalgias to lipitor, crestor, pravastatin and weakness and cramping  in bilateral leg.  [name] [Linagliptin] Itching   Objective:  There were no vitals filed for this visit. There is no height or weight on file to calculate BMI. Constitutional Well developed. Well nourished.  Vascular Dorsalis pedis pulses palpable bilaterally. Posterior tibial pulses palpable bilaterally. Capillary refill normal to all digits.  No cyanosis or clubbing noted. Pedal hair growth normal.  Neurologic Normal speech. Oriented to person, place, and time. Epicritic sensation to light touch grossly present bilaterally.  Dermatologic Nails well groomed and normal in appearance. No open wounds. No skin lesions.  Orthopedic:  No pain on palpation to the ATFL bilaterally.  Mild swelling noted to the lateral ankle region.  No pain with ankle inversion eversion dorsiflexion or plantarflexion.   Radiographs: None Assessment:   1. Sprain of anterior talofibular ligament of left ankle, initial encounter   2. Sprain of anterior talofibular ligament of right ankle, initial encounter    Plan:  Patient was evaluated and treated and all questions answered.  Bilateral ATFL ankle sprain -Resolved.  Patient was explained how to properly place the ankle brace bilaterally.  I continuously reeducated her about ambulating with ankle brace at all times.  However it is time to start transitioning out of the brace and if it  becomes painful or if she is walking for long period of time I explained to her to go back into the brace -I will see her as needed at this point.  As her symptoms of resolved.   Return if symptoms worsen or fail to improve.

## 2019-07-14 ENCOUNTER — Other Ambulatory Visit: Payer: Self-pay | Admitting: Family Medicine

## 2019-07-30 ENCOUNTER — Encounter (INDEPENDENT_AMBULATORY_CARE_PROVIDER_SITE_OTHER): Payer: Medicare Other | Admitting: Ophthalmology

## 2019-08-05 ENCOUNTER — Other Ambulatory Visit: Payer: Self-pay | Admitting: Family Medicine

## 2019-09-02 ENCOUNTER — Other Ambulatory Visit: Payer: Self-pay | Admitting: Family Medicine

## 2019-09-05 NOTE — Progress Notes (Signed)
[facility] Note  09/06/2019     CHIEF COMPLAINT Patient presents for Retina Follow Up   HISTORY OF PRESENT ILLNESS: Brenda Moreno is a 74 y.o. female who presents to the clinic today for:   HPI    Retina Follow Up    Patient presents with  Diabetic Retinopathy.  In both eyes.  Severity is moderate.  Duration of 7 months.  I, the attending physician,  performed the HPI with the patient and updated documentation appropriately.          Comments    Patient states vision the same OU. Sees spot or floater less often OS. BS was 129 this am. Last a1c was 8.3 on 10.27.20. Uses AT's prn OU.        Last edited by Bernarda Caffey, MD on 09/06/2019 12:42 PM. (History)    pt states her vision is doing well, she states she has a floater in her left eye that she occasionally sees, but none in her right eye, pt states her blood sugar is under control   Referring physician: Shirleen Schirmer, PA-C [address] [place], [place] [address]  HISTORICAL INFORMATION:   Selected notes from the MEDICAL RECORD NUMBER Referred by Shirleen Schirmer for concern of retinal heme OS LEE:  Ocular Hx- PMH-    CURRENT MEDICATIONS: No current outpatient medications on file. (Ophthalmic Drugs)   No current facility-administered medications for this visit. (Ophthalmic Drugs)   Current Outpatient Medications (Other)  Medication Sig  . amLODipine (NORVASC) 5 MG tablet Take 1 tablet (5 mg total) by mouth daily.  . benzonatate (TESSALON) 100 MG capsule Take 1 capsule (100 mg total) by mouth every 8 (eight) hours.  . cetirizine (ZYRTEC) 10 MG tablet Take 1 tablet (10 mg total) by mouth daily.  Marland Kitchen CINNAMON PO Take 1,000 mg by mouth 2 (two) times daily.    . clopidogrel (PLAVIX) 75 MG tablet TAKE 1 TABLET BY MOUTH EVERY DAY  . clotrimazole-betamethasone (LOTRISONE) cream Apply 1 application topically 2 (two) times daily.  . Coenzyme Q10 (CO Q 10 PO) Take 100 mg by mouth daily.    Marland Kitchen  doxazosin (CARDURA) 2 MG tablet TAKE 1 TABLET BY MOUTH EVERY DAY  . fluticasone (FLONASE) 50 MCG/ACT nasal spray Place 2 sprays into both nostrils daily as needed for allergies.  . furosemide (LASIX) 20 MG tablet TAKE 1 TABLET BY MOUTH DAILY AS NEEDED FOR EDEMA  . glipiZIDE (GLUCOTROL XL) 5 MG 24 hr tablet Take 1 tablet (5 mg total) by mouth daily with breakfast.  . glucose blood (ACCU-CHEK GUIDE) test strip Use to test blood sugar up to 3 time a day E11.9  . Insulin Isophane & Regular Human (HUMULIN 70/30 MIX) (70-30) 100 UNIT/ML PEN INJECT 15 UNITS WITH BREAKFAST AND 7 UNITS WITH SUPPER  . JARDIANCE 25 MG TABS tablet TAKE 1 TABLET BY MOUTH EVERY DAY  . LIVALO 4 MG TABS TAKE 1 TABLET BY MOUTH EVERY DAY  . metFORMIN (GLUCOPHAGE) 1000 MG tablet TAKE 1 TABLET (1,000 MG TOTAL) BY MOUTH 2 TIMES DAILY WITH A MEAL.  . nitroGLYCERIN (NITROSTAT) 0.4 MG SL tablet Place 1 tablet (0.4 mg total) under the tongue every 5 (five) minutes as needed for chest pain.  . potassium chloride (KLOR-CON) 10 MEQ tablet Take 20 mEq by mouth daily.  . quinapril (ACCUPRIL) 40 MG tablet Take 1 tablet (40 mg total) by mouth 2 (two) times daily.  . traMADol Veatrice Bourbon)  50 MG tablet Take 1 tablet (50 mg total) by mouth every 6 (six) hours as needed.  . Vitamin D, Ergocalciferol, (DRISDOL) 1.25 MG (50000 UT) CAPS capsule Take 1 capsule (50,000 Units total) by mouth every 7 (seven) days.   No current facility-administered medications for this visit. (Other)      REVIEW OF SYSTEMS: ROS    Positive for: Genitourinary, Musculoskeletal, Endocrine, Cardiovascular, Eyes   Negative for: Constitutional, Gastrointestinal, Neurological, Skin, HENT, Respiratory, Psychiatric, Allergic/Imm, Heme/Lymph   Last edited by Roselee Nova D, COT on 09/06/2019  9:34 AM. (History)       ALLERGIES Allergies  Allergen Reactions  . Levaquin [Levofloxacin In D5w] Other (See Comments)    Pain all over and in joints  . Statins Other (See Comments)     Severe myalgias to lipitor, crestor, pravastatin and weakness and cramping  in bilateral leg.  [name] [Linagliptin] Itching    PAST MEDICAL HISTORY Past Medical History:  Diagnosis Date  . Arthritis   . CAD (coronary artery disease)    S/P cabg  . Carotid artery occlusion   . Cataract   . CHF (congestive heart failure) ([place])   . CKD (chronic kidney disease) 12/21/2018  . DDD (degenerative disc disease), lumbar   . Diabetes mellitus   . Diabetic retinopathy ([place])    NPDR OU  . Hyperlipidemia   . Hypertension   . Hypertensive retinopathy    OU  . Joint pain   . Leg pain   . Myocardial infarction ([place]) 1998  . Peripheral vascular disease ([place])   . PVD (peripheral vascular disease) ([place])   . Stroke [facility])    Past Surgical History:  Procedure Laterality Date  . APPENDECTOMY    . BREAST EXCISIONAL BIOPSY Left 2008  . CHOLECYSTECTOMY     Gall bladder  . COLONOSCOPY  2008  . CORONARY ARTERY BYPASS GRAFT     1998  . LEFT HEART CATH AND CORS/GRAFTS ANGIOGRAPHY N/A 12/22/2017   Procedure: LEFT HEART CATH AND CORS/GRAFTS ANGIOGRAPHY;  Surgeon: Burnell Blanks, MD;  Location: [facility] CV LAB;  Service: Cardiovascular;  Laterality: N/A;  . PR VEIN BYPASS GRAFT,AORTO-FEM-POP    . TEE WITHOUT CARDIOVERSION N/A 01/19/2018   Procedure: TRANSESOPHAGEAL ECHOCARDIOGRAM (TEE);  Surgeon: Josue Hector, MD;  Location: [facility] END[facility]COPY;  Service: Cardiovascular;  Laterality: N/A;  . TUBAL LIGATION      FAMILY HISTORY Family History  Problem Relation Age of Onset  . Heart disease Mother   . Diabetes Mother   . Hyperlipidemia Mother   . Hypertension Mother   . Stroke Mother   . Heart disease Father        Heart Disease before age 43  . Diabetes Father   . Hyperlipidemia Father   . Hypertension Father   . Heart disease Sister        heart attack  . Diabetes Sister        Amputation  . Hypertension Sister   . Other Sister        history of amputation  . Heart attack  Sister   . Diabetes Brother   . Hypertension Brother   . Heart disease Brother 65       Before age 56  . Hyperlipidemia Brother   . Diabetes Brother        scirrosis of liver  . Coronary artery disease Other   . Colon cancer Neg Hx   . Stomach cancer Neg Hx   . Esophageal  cancer Neg Hx     SOCIAL HISTORY Social History   Tobacco Use  . Smoking status: Former Smoker    Packs/day: 0.25    Types: Cigarettes    Quit date: 09/13/2011    Years since quitting: 7.9  . Smokeless tobacco: Never Used  Substance Use Topics  . Alcohol use: No  . Drug use: No         OPHTHALMIC EXAM:  Base Eye Exam    Visual Acuity (Snellen - Linear)      Right Left   Dist cc 20/25 +1 20/50   Dist ph cc 20/25 +2 20/25 -1   Correction: Glasses       Tonometry (Tonopen, 9:43 AM)      Right Left   Pressure 14 14       Pupils      Dark Light Shape React APD   Right 4 3 Round Brisk None   Left 4 3 Round Brisk None       Visual Fields (Counting fingers)      Left Right    Full Full       Extraocular Movement      Right Left    Full, Ortho Full, Ortho       Neuro/Psych    Oriented x3: Yes   Mood/Affect: Normal       Dilation    Both eyes: 1.0% Mydriacyl, 2.5% Phenylephrine @ 9:43 AM        Slit Lamp and Fundus Exam    Slit Lamp Exam      Right Left   Lids/Lashes Dermatochalasis - upper lid, mild Meibomian gland dysfunction Dermatochalasis - upper lid, mild Meibomian gland dysfunction   Conjunctiva/Sclera White and quiet conj cyst IT quad   Cornea mild Arcus, 1+ inferior Punctate epithelial erosions mild Arcus, 1+ inferior Punctate epithelial erosions   Anterior Chamber deep and clear deep and clear   Iris Round and dilated, No NVI Round and dilated, No NVI   Lens 3+ Nuclear sclerosis with early brunescence, 2+ Cortical cataract 3+ Nuclear sclerosis with early brunescence, 2+ Cortical cataract   Vitreous Vitreous syneresis, Posterior vitreous detachment Vitreous syneresis,  Posterior vitreous detachment       Fundus Exam      Right Left   Disc Pink and Sharp, mild tilt, temporal PPA, Compact mild tilt, Pink and Sharp, Compact   C/D Ratio 0.2 0.3   Macula Flat, Blunted foveal reflex, mild RPE mottling, mild scattered Microaneurysms, no edema Flat, Blunted foveal reflex, mild, scattered Microaneurysms, Retinal pigment epithelial mottling, no edema   Vessels mild Vascular attenuation, mild tortuosity Vascular attenuation, mild Tortuousity   Periphery Attached, no heme Attached, No heme           IMAGING AND PROCEDURES  Imaging and Procedures for @TODAY @  OCT, Retina - OU - Both Eyes       Right Eye Quality was good. Central Foveal Thickness: 226. Progression has been stable. Findings include normal foveal contour, no IRF, no SRF.   Left Eye Quality was good. Central Foveal Thickness: 225. Progression has been stable. Findings include no SRF, normal foveal contour, no IRF (Trace cystic changes temporal macula - stably resolved).   Notes *Images captured and stored on drive  Diagnosis / Impression:  NFP, no IRF/SRF OU No DME OU  Clinical management:  See below  Abbreviations: NFP - Normal foveal profile. CME - cystoid macular edema. PED - pigment epithelial detachment. IRF - intraretinal fluid. SRF -  subretinal fluid. EZ - ellipsoid zone. ERM - epiretinal membrane. ORA - outer retinal atrophy. ORT - outer retinal tubulation. SRHM - subretinal hyper-reflective material                 ASSESSMENT/PLAN:    ICD-10-CM   1. Mild nonproliferative diabetic retinopathy of both eyes without macular edema associated with type 2 diabetes mellitus ([place])  [id number]   2. Retinal edema  H35.81 OCT, Retina - OU - Both Eyes  3. Essential hypertension  I10   4. Hypertensive retinopathy of both eyes  H35.033   5. Posterior vitreous detachment of both eyes  H43.813   6. Combined forms of age-related cataract of both eyes  H25.813     1,2. Mild  nonproliferative diabetic retinopathy w/o DME, OU  - exam shows scattered MA OU -- isolated to macula and very sparse OU  - FA (04.27.20) shows scattered MA OU; no NV OU; OS with cluster of MA w/ leakage temporal macula  - OCT without diabetic macular edema OU -- OS w/ stable resolution of cystic changes  - f/u in 1 year  3,4. Hypertensive retinopathy OU  - discussed importance of tight BP control  - monitor  5. PVD / vitreous syneresis OU  - Discussed findings and prognosis  - No RT or RD on 360 peripheral exam  - Reviewed s/s of RT/RD  - Strict return precautions for any such RT/RD signs/symptoms  6. Mixed form age related cataract  - The symptoms of cataract, surgical options, and treatments and risks were discussed with patient.  - discussed diagnosis and progression  - approaching visual significance  - under expert management at [place] Ordered this visit:  No orders of the defined types were placed in this encounter.      Return in about 1 year (around 09/05/2020) for f/u NPDR OU, DFE, OCT.  There are no Patient Instructions on file for this visit.   Explained the diagnoses, plan, and follow up with the patient and they expressed understanding.  Patient expressed understanding of the importance of proper follow up care.   This document serves as a record of services personally performed by Gardiner Sleeper, MD, PhD. It was created on their behalf by Ernest Mallick, OA, an ophthalmic assistant. The creation of this record is the provider's dictation and/or activities during the visit.    Electronically signed by: Ernest Mallick, OA 01.07.2021 12:42 PM   Gardiner Sleeper, M.D., Ph.D. Diseases & Surgery of the Retina and Vitreous [facility]  I have reviewed the above documentation for accuracy and completeness, and I agree with the above. Gardiner Sleeper, M.D., Ph.D. 09/06/19 12:44 PM     Abbreviations: M myopia  (nearsighted); A astigmatism; H hyperopia (farsighted); P presbyopia; Mrx spectacle prescription;  CTL contact lenses; OD right eye; OS left eye; OU both eyes  XT exotropia; ET esotropia; PEK punctate epithelial keratitis; PEE punctate epithelial erosions; DES dry eye syndrome; MGD meibomian gland dysfunction; ATs artificial tears; PFAT's preservative free artificial tears; [place] nuclear sclerotic cataract; PSC posterior subcapsular cataract; ERM epi-retinal membrane; PVD posterior vitreous detachment; RD retinal detachment; DM diabetes mellitus; DR diabetic retinopathy; NPDR non-proliferative diabetic retinopathy; PDR proliferative diabetic retinopathy; CSME clinically significant macular edema; DME diabetic macular edema; dbh dot blot hemorrhages; CWS cotton wool spot; POAG primary open angle glaucoma; C/D cup-to-disc ratio; HVF humphrey visual field; GVF goldmann visual field; OCT optical coherence tomography; IOP  intraocular pressure; BRVO Branch retinal vein occlusion; CRVO central retinal vein occlusion; CRAO central retinal artery occlusion; BRAO branch retinal artery occlusion; RT retinal tear; SB scleral buckle; PPV pars plana vitrectomy; VH Vitreous hemorrhage; PRP panretinal laser photocoagulation; IVK intravitreal kenalog; VMT vitreomacular traction; MH Macular hole;  NVD neovascularization of the disc; NVE neovascularization elsewhere; AREDS age related eye disease study; ARMD age related macular degeneration; POAG primary open angle glaucoma; EBMD epithelial/anterior basement membrane dystrophy; ACIOL anterior chamber intraocular lens; IOL intraocular lens; PCIOL posterior chamber intraocular lens; Phaco/IOL phacoemulsification with intraocular lens placement; [place] photorefractive keratectomy; LASIK laser assisted in situ keratomileusis; HTN hypertension; DM diabetes mellitus; COPD chronic obstructive pulmonary disease

## 2019-09-06 ENCOUNTER — Encounter (INDEPENDENT_AMBULATORY_CARE_PROVIDER_SITE_OTHER): Payer: Self-pay | Admitting: Ophthalmology

## 2019-09-06 ENCOUNTER — Ambulatory Visit (INDEPENDENT_AMBULATORY_CARE_PROVIDER_SITE_OTHER): Payer: Medicare Other | Admitting: Ophthalmology

## 2019-09-06 DIAGNOSIS — E113293 Type 2 diabetes mellitus with mild nonproliferative diabetic retinopathy without macular edema, bilateral: Secondary | ICD-10-CM

## 2019-09-06 DIAGNOSIS — H25813 Combined forms of age-related cataract, bilateral: Secondary | ICD-10-CM

## 2019-09-06 DIAGNOSIS — H3581 Retinal edema: Secondary | ICD-10-CM | POA: Diagnosis not present

## 2019-09-06 DIAGNOSIS — H43813 Vitreous degeneration, bilateral: Secondary | ICD-10-CM

## 2019-09-06 DIAGNOSIS — I10 Essential (primary) hypertension: Secondary | ICD-10-CM

## 2019-09-06 DIAGNOSIS — H35033 Hypertensive retinopathy, bilateral: Secondary | ICD-10-CM | POA: Diagnosis not present

## 2019-11-20 ENCOUNTER — Other Ambulatory Visit: Payer: Self-pay | Admitting: Internal Medicine

## 2019-11-20 MED ORDER — METFORMIN HCL 1000 MG PO TABS: 1000.0000 mg | ORAL_TABLET | Freq: Two times a day (BID) | ORAL | 0 refills | Status: AC

## 2019-11-20 NOTE — Telephone Encounter (Signed)
New message:   1.Medication Requested:  metFORMIN (GLUCOPHAGE) 1000 MG tablet 2. Pharmacy (Name, Street, [place]): [id number] [place], [place] - [address] 3. On Med List: yes  4. Last Visit with PCP: 06/25/19  5. Next visit date with PCP:12/24/19   Agent: Please be advised that RX refills may take up to 3 business days. We ask that you follow-up with your pharmacy.

## 2019-11-20 NOTE — Telephone Encounter (Signed)
Erx sent for 90 day supply.

## 2019-12-06 ENCOUNTER — Telehealth: Payer: Self-pay

## 2019-12-06 NOTE — Telephone Encounter (Signed)
Insurance doesn't cover Accu Check supplies. Please submit new prescriptions for One touch meter, One touch test strips and One touch Lancets

## 2019-12-08 MED ORDER — LANCETS MISC: 3 refills | Status: AC

## 2019-12-08 MED ORDER — ONETOUCH VERIO W/DEVICE KIT: 1.0000 | PACK | Freq: Three times a day (TID) | 1 refills | Status: AC

## 2019-12-08 MED ORDER — ONETOUCH VERIO VI STRP: ORAL_STRIP | 3 refills | Status: AC

## 2019-12-08 NOTE — Telephone Encounter (Signed)
Ok done erx 

## 2019-12-10 ENCOUNTER — Ambulatory Visit (INDEPENDENT_AMBULATORY_CARE_PROVIDER_SITE_OTHER): Payer: Medicare HMO | Admitting: Cardiovascular Disease

## 2019-12-10 ENCOUNTER — Other Ambulatory Visit: Payer: Self-pay

## 2019-12-10 ENCOUNTER — Encounter: Payer: Self-pay | Admitting: Cardiovascular Disease

## 2019-12-10 VITALS — BP 138/70 | HR 51 | Ht 66.0 in | Wt 157.4 lb

## 2019-12-10 DIAGNOSIS — E782 Mixed hyperlipidemia: Secondary | ICD-10-CM | POA: Diagnosis not present

## 2019-12-10 DIAGNOSIS — I25810 Atherosclerosis of coronary artery bypass graft(s) without angina pectoris: Secondary | ICD-10-CM

## 2019-12-10 DIAGNOSIS — I5032 Chronic diastolic (congestive) heart failure: Secondary | ICD-10-CM

## 2019-12-10 DIAGNOSIS — I10 Essential (primary) hypertension: Secondary | ICD-10-CM | POA: Diagnosis not present

## 2019-12-10 MED ORDER — NITROGLYCERIN 0.4 MG SL SUBL: 0.4000 mg | SUBLINGUAL_TABLET | SUBLINGUAL | 6 refills | Status: AC | PRN

## 2019-12-10 NOTE — Progress Notes (Signed)
Brenda Moreno Date of Birth  1946-08-18 [facility] [address]    [place], [place]  [address] [phone]  Fax  [phone]  Problem list 1. Coronary artery disease-status post CABG 2.  Essential hypertension 3.  hyperliipdemia 4.  CVA   Brenda Moreno a 74 year old female with a history of coronary artery disease but she status post coronary artery bypass grafting in 1998. I last saw her approximately 10 years ago. She presents today for further evaluation of mild chest pain.  The chest pains occurred with rest and lasted 5 minutes.  She did not take NTG.  She has been exercising at the [facility]  on a regular basis.  She had a stress test after I saw her in November which was negative for ischemia.  She still eats extra salt.    Oct. 5, 2017:  Brenda Moreno is seen today after 4 year absence. Had some mold issues with her house,   develped dyspnea and CP .   Was started on a steroid Dosepak and she's feeling somewhat better.  No episodes of angina .  No exercising.     December 10, 2019:  Brenda Moreno is seen today for a follow-up visit.  She has a history of coronary artery disease and is status post coronary artery bypass grafting, HTN, DM,  She is seen today after 4-year absence. Is not smoking   No CP or dyspnea Has left leg pain .  Has had a CVA since saw her .    Is generally intolerent to statins. Tolerates L[place] but LDL is still too elevated.   No exercising ,   Has pain in her left leg.  Her distal left leg pulses are good    Current Outpatient Medications on File Prior to Visit  Medication Sig Dispense Refill  . amLODipine (NORVASC) 5 MG tablet Take 1 tablet (5 mg total) by mouth daily. 90 tablet 1  . benzonatate (TESSALON) 100 MG capsule Take 1 capsule (100 mg total) by mouth every 8 (eight) hours. 21 capsule 0  . Blood Glucose Monitoring Suppl (ONETOUCH VERIO) w/Device KIT 1 Device by Does not apply route in the morning, at noon, and at bedtime. E11.9 1 kit 1  .  cetirizine (ZYRTEC) 10 MG tablet Take 1 tablet (10 mg total) by mouth daily. 30 tablet 0  . CINNAMON PO Take 1,000 mg by mouth 2 (two) times daily.      . clopidogrel (PLAVIX) 75 MG tablet TAKE 1 TABLET BY MOUTH EVERY DAY 90 tablet 3  . clotrimazole-betamethasone (LOTRISONE) cream Apply 1 application topically 2 (two) times daily. 30 g 0  . Coenzyme Q10 (CO Q 10 PO) Take 100 mg by mouth daily.      Marland Kitchen doxazosin (CARDURA) 2 MG tablet TAKE 1 TABLET BY MOUTH EVERY DAY 90 tablet 0  . fluticasone (FLONASE) 50 MCG/ACT nasal spray Place 2 sprays into both nostrils daily as needed for allergies.    . furosemide (LASIX) 20 MG tablet TAKE 1 TABLET BY MOUTH DAILY AS NEEDED FOR EDEMA 90 tablet 1  . glipiZIDE (GLUCOTROL XL) 5 MG 24 hr tablet Take 1 tablet (5 mg total) by mouth daily with breakfast. 90 tablet 3  . glucose blood (ONETOUCH VERIO) test strip Use as instructed three times daily E11.9 300 each 3  . Insulin Isophane & Regular Human (HUMULIN 70/30 MIX) (70-30) 100 UNIT/ML PEN INJECT 15 UNITS WITH BREAKFAST AND 7 UNITS WITH SUPPER 15 pen 11  . JARDIANCE 25  MG TABS tablet TAKE 1 TABLET BY MOUTH EVERY DAY 30 tablet 5  . Lancets MISC Use as directed three times daily E11.9 300 each 3  . LIVALO 4 MG TABS TAKE 1 TABLET BY MOUTH EVERY DAY 90 tablet 1  . metFORMIN (GLUCOPHAGE) 1000 MG tablet Take 1 tablet (1,000 mg total) by mouth 2 (two) times daily with a meal. 180 tablet 0  . nitroGLYCERIN (NITROSTAT) 0.4 MG SL tablet Place 1 tablet (0.4 mg total) under the tongue every 5 (five) minutes as needed for chest pain. 90 tablet 2  . potassium chloride (KLOR-CON) 10 MEQ tablet Take 20 mEq by mouth daily.    . quinapril (ACCUPRIL) 40 MG tablet Take 1 tablet (40 mg total) by mouth 2 (two) times daily. 180 tablet 3  . traMADol (ULTRAM) 50 MG tablet Take 1 tablet (50 mg total) by mouth every 6 (six) hours as needed. 10 tablet 0  . Vitamin D, Ergocalciferol, (DRISDOL) 1.25 MG (50000 UT) CAPS capsule Take 1 capsule  (50,000 Units total) by mouth every 7 (seven) days. 12 capsule 0   No current facility-administered medications on file prior to visit.    Allergies  Allergen Reactions  . Levaquin [Levofloxacin In D5w] Other (See Comments)    Pain all over and in joints  . Statins Other (See Comments)    Severe myalgias to lipitor, crestor, pravastatin and weakness and cramping  in bilateral leg.  [name] [Linagliptin] Itching    Past Medical History:  Diagnosis Date  . Arthritis   . CAD (coronary artery disease)    S/P cabg  . Carotid artery occlusion   . Cataract   . CHF (congestive heart failure) ([place])   . CKD (chronic kidney disease) 12/21/2018  . DDD (degenerative disc disease), lumbar   . Diabetes mellitus   . Diabetic retinopathy ([place])    NPDR OU  . Hyperlipidemia   . Hypertension   . Hypertensive retinopathy    OU  . Joint pain   . Leg pain   . Myocardial infarction ([place]) 1998  . Peripheral vascular disease ([place])   . PVD (peripheral vascular disease) ([place])   . Stroke [facility])     Past Surgical History:  Procedure Laterality Date  . APPENDECTOMY    . BREAST EXCISIONAL BIOPSY Left 2008  . CHOLECYSTECTOMY     Gall bladder  . COLONOSCOPY  2008  . CORONARY ARTERY BYPASS GRAFT     1998  . LEFT HEART CATH AND CORS/GRAFTS ANGIOGRAPHY N/A 12/22/2017   Procedure: LEFT HEART CATH AND CORS/GRAFTS ANGIOGRAPHY;  Surgeon: Burnell Blanks, MD;  Location: [place] CV LAB;  Service: Cardiovascular;  Laterality: N/A;  . PR VEIN BYPASS GRAFT,AORTO-FEM-POP    . TEE WITHOUT CARDIOVERSION N/A 01/19/2018   Procedure: TRANSESOPHAGEAL ECHOCARDIOGRAM (TEE);  Surgeon: Josue Hector, MD;  Location: [facility] ENDOSCOPY;  Service: Cardiovascular;  Laterality: N/A;  . TUBAL LIGATION      Social History   Tobacco Use  Smoking Status Former Smoker  . Packs/day: 0.25  . Types: Cigarettes  . Quit date: 09/13/2011  . Years since quitting: 8.2  Smokeless Tobacco Never Used    Social History    Substance and Sexual Activity  Alcohol Use No    Family History  Problem Relation Age of Onset  . Heart disease Mother   . Diabetes Mother   . Hyperlipidemia Mother   . Hypertension Mother   . Stroke Mother   . Heart disease Father  Heart Disease before age 21  . Diabetes Father   . Hyperlipidemia Father   . Hypertension Father   . Heart disease Sister        heart attack  . Diabetes Sister        Amputation  . Hypertension Sister   . Other Sister        history of amputation  . Heart attack Sister   . Diabetes Brother   . Hypertension Brother   . Heart disease Brother 54       Before age 20  . Hyperlipidemia Brother   . Diabetes Brother        scirrosis of liver  . Coronary artery disease Other   . Colon cancer Neg Hx   . Stomach cancer Neg Hx   . Esophageal cancer Neg Hx     Reviw of Systems:  Reviewed in the HPI.  All other systems are negative.  Physical Exam: Blood pressure 138/70, pulse (!) 51, height _0  (1.676 m), weight 157 lb 6.4 oz (71.4 kg), SpO2 98 %.  GEN:  Middle age female,   NAD  HEENT: Normal NECK: No JVD; No carotid bruits LYMPHATICS: No lymphadenopathy CARDIAC: RRR , no murmurs, rubs, gallops,  Good distal foot pulses.  RESPIRATORY:  Clear to auscultation without rales, wheezing or rhonchi  ABDOMEN: Soft, non-tender, non-distended MUSCULOSKELETAL:  No edema; No deformity  SKIN: Warm and dry NEUROLOGIC:  Alert and oriented x 3     ECG: December 10, 2019: Sinus bradycardia 51 bpm.  No ST or T wave changes.    Assessment / Plan:   1.  CAD -  No angina ,  Encourage her to exercicse Refill NTG    2. Essential HTN:   BP is well controlled.   Needs to exercise    3.  PAD :  Has seen Dr. Oneida Alar,  Will follow up with him     Mertie Moores, MD  12/10/2019 11:49 AM    [address],  [place], [place]  [address] Pager [phone] Phone: [phone]; Fax: [phone]

## 2019-12-10 NOTE — Patient Instructions (Addendum)
Medication Instructions:  Your physician recommends that you continue on your current medications as directed. Please refer to the Current Medication list given to you today.  *If you need a refill on your cardiac medications before your next appointment, please call your pharmacy*   Lab Work: None Ordered If you have labs (blood work) drawn today and your tests are completely normal, you will receive your results only by: Marland Kitchen MyChart Message (if you have MyChart) OR . A paper copy in the mail If you have any lab test that is abnormal or we need to change your treatment, we will call you to review the results.   Testing/Procedures: None Ordered   Follow-Up: At [facility], you and your health needs are our priority.  As part of our continuing mission to provide you with exceptional heart care, we have created designated Provider Care Teams.  These Care Teams include your primary Cardiologist (physician) and Advanced Practice Providers (APPs -  Physician Assistants and Nurse Practitioners) who all work together to provide you with the care you need, when you need it.  We recommend signing up for the patient portal called "MyChart".  Sign up information is provided on this After Visit Summary.  MyChart is used to connect with patients for Virtual Visits (Telemedicine).  Patients are able to view lab/test results, encounter notes, upcoming appointments, etc.  Non-urgent messages can be sent to your provider as well.   To learn more about what you can do with MyChart, go to [email or web address].    Your next appointment:   1 year(s)  The format for your next appointment:   In Person  Provider:   You may see Mertie Moores, MD or one of the following Advanced Practice Providers on your designated Care Team:    Richardson Dopp, PA-C  [place], [place]  Daune Perch, NP    Other Instructions Your physician recommends that you return for an appointment on Thursday April 22 at 3:00  pm with Pharmacists to discuss management of cholesterol

## 2019-12-19 ENCOUNTER — Other Ambulatory Visit: Payer: Self-pay

## 2019-12-19 ENCOUNTER — Ambulatory Visit (INDEPENDENT_AMBULATORY_CARE_PROVIDER_SITE_OTHER): Payer: Medicare HMO | Admitting: Pharmacist

## 2019-12-19 DIAGNOSIS — E7800 Pure hypercholesterolemia, unspecified: Secondary | ICD-10-CM

## 2019-12-19 NOTE — Patient Instructions (Signed)
It was a pleasure to meet today!  Please CONTINUE to take Livalo 4mg  daily I will submit a prior authorization to your insurance for Praluent I will call you once its approved. At that time I will need to know your household income and your SSN to apply for the healthwell grant.  Try to limit fried foods   Please give [place] a call at [phone] with any questions or concerns

## 2019-12-19 NOTE — Progress Notes (Signed)
Patient ID: Brenda Moreno                 DOB: 06-Jul-1946                    MRN: [id number]     HPI: Brenda Moreno is a 74 y.o. female patient referred to lipid clinic by Dr. Acie Fredrickson. PMH is significant for CAD s/p CABG in 1998, HTN, HLD, CVA, DM and PAD. Patient seen by Dr. Acie Fredrickson on 4/13 after a 4 year absence. She has had issues tolerating statins. Currently on Livalo but LDL still elevated. Referred to lipid clinic for further management.   Patient presents today to lipid clinic for initial visit. Patient states that she use to restrict herself to fried food only one day a week but she has gotten out of that habit. She likes to go to the [facility] and swim in the summer but she has not done anything recently.  PCSK9i-[employer] medicare $47/month  Current Medications: Livalo 59m daily Intolerances: lipitor, crestor, pravastatin (muscle pains, cramping, weakness in bilateral legs) Risk Factors: progressive ASCVD (CAD, CVA), DM, HTN LDL goal: <55  Diet: breakfast: toast, coffee, boiled egg, oatmeal (cinnaomon, stevia)  Lunch: salad (eggs, chicken, tomatos) ranch  Dinner: chicken, pork chops, gravy, string beans, no butter, mostly bakes, sometimes fried  Exercise: none recently  Family History:  Family History  Problem Relation Age of Onset  . Heart disease Mother   . Diabetes Mother   . Hyperlipidemia Mother   . Hypertension Mother   . Stroke Mother   . Heart disease Father        Heart Disease before age 72 . Diabetes Father   . Hyperlipidemia Father   . Hypertension Father   . Heart disease Sister        heart attack  . Diabetes Sister        Amputation  . Hypertension Sister   . Other Sister        history of amputation  . Heart attack Sister   . Diabetes Brother   . Hypertension Brother   . Heart disease Brother 528      Before age 72 . Hyperlipidemia Brother   . Diabetes Brother        scirrosis of liver  . Coronary artery disease Other   . Colon cancer Neg Hx   .  Stomach cancer Neg Hx   . Esophageal cancer Neg Hx      Social History: former smoker, no ETOH, no illicit drugs  LLEXN:17/00/1749TC 200, TG 163, HDL 48, LDL 118 (Livalo 434mdaily)  Past Medical History:  Diagnosis Date  . Arthritis   . CAD (coronary artery disease)    S/P cabg  . Carotid artery occlusion   . Cataract   . CHF (congestive heart failure) ([facility]  . CKD (chronic kidney disease) 12/21/2018  . DDD (degenerative disc disease), lumbar   . Diabetes mellitus   . Diabetic retinopathy ([facility]   NPDR OU  . Hyperlipidemia   . Hypertension   . Hypertensive retinopathy    OU  . Joint pain   . Leg pain   . Myocardial infarction ([facility]1998  . Peripheral vascular disease ([facility]  . PVD (peripheral vascular disease) ([facility]  . [facility]    Current Outpatient Medications on File Prior to Visit  Medication Sig Dispense Refill  . amLODipine (NORVASC) 5 MG tablet Take 1  tablet (5 mg total) by mouth daily. 90 tablet 1  . Blood Glucose Monitoring Suppl (ONETOUCH VERIO) w/Device KIT 1 Device by Does not apply route in the morning, at noon, and at bedtime. E11.9 1 kit 1  . cetirizine (ZYRTEC) 10 MG tablet Take 1 tablet (10 mg total) by mouth daily. 30 tablet 0  . CINNAMON PO Take 1,000 mg by mouth 2 (two) times daily.      . clopidogrel (PLAVIX) 75 MG tablet TAKE 1 TABLET BY MOUTH EVERY DAY 90 tablet 3  . clotrimazole-betamethasone (LOTRISONE) cream Apply 1 application topically 2 (two) times daily. 30 g 0  . Coenzyme Q10 (CO Q 10 PO) Take 100 mg by mouth daily.      Marland Kitchen doxazosin (CARDURA) 2 MG tablet TAKE 1 TABLET BY MOUTH EVERY DAY 90 tablet 0  . fluticasone (FLONASE) 50 MCG/ACT nasal spray Place 2 sprays into both nostrils daily as needed for allergies.    . furosemide (LASIX) 20 MG tablet TAKE 1 TABLET BY MOUTH DAILY AS NEEDED FOR EDEMA 90 tablet 1  . glipiZIDE (GLUCOTROL XL) 5 MG 24 hr tablet Take 1 tablet (5 mg total) by mouth daily with breakfast. 90 tablet 3  . glucose  blood (ONETOUCH VERIO) test strip Use as instructed three times daily E11.9 300 each 3  . Insulin Isophane & Regular Human (HUMULIN 70/30 MIX) (70-30) 100 UNIT/ML PEN INJECT 15 UNITS WITH BREAKFAST AND 7 UNITS WITH SUPPER 15 pen 11  . JARDIANCE 25 MG TABS tablet TAKE 1 TABLET BY MOUTH EVERY DAY 30 tablet 5  . Lancets MISC Use as directed three times daily E11.9 300 each 3  . LIVALO 4 MG TABS TAKE 1 TABLET BY MOUTH EVERY DAY 90 tablet 1  . metFORMIN (GLUCOPHAGE) 1000 MG tablet Take 1 tablet (1,000 mg total) by mouth 2 (two) times daily with a meal. 180 tablet 0  . nitroGLYCERIN (NITROSTAT) 0.4 MG SL tablet Place 1 tablet (0.4 mg total) under the tongue every 5 (five) minutes as needed for chest pain. 25 tablet 6  . potassium chloride (KLOR-CON) 10 MEQ tablet Take 20 mEq by mouth daily.    . quinapril (ACCUPRIL) 40 MG tablet Take 1 tablet (40 mg total) by mouth 2 (two) times daily. 180 tablet 3  . traMADol (ULTRAM) 50 MG tablet Take 1 tablet (50 mg total) by mouth every 6 (six) hours as needed. 10 tablet 0  . Vitamin D, Ergocalciferol, (DRISDOL) 1.25 MG (50000 UT) CAPS capsule Take 1 capsule (50,000 Units total) by mouth every 7 (seven) days. 12 capsule 0   No current facility-administered medications on file prior to visit.    Allergies  Allergen Reactions  . Levaquin [Levofloxacin In D5w] Other (See Comments)    Pain all over and in joints  . Statins Other (See Comments)    Severe myalgias to lipitor, crestor, pravastatin and weakness and cramping  in bilateral leg.  [name] [Linagliptin] Itching    Assessment/Plan:  1. Hyperlipidemia - LDL is above goal of <55. Reviewed PCSK9i LDL lowering abilities, cardiovascular risk reduction, injection technique, side effects and cost. Patient agreeable to try PCSK9i. Will submit prior authorization for Praluent. Will also plan to submit for healthwell foundation. Patient will get me the information for the grant when I call her with the approval  information. Patient instructed to continue her Livalo.   Thank you,  Ramond Dial, Pharm.D, BCPS, CPP [place] [facility]  [address],  [place], [place] [address]  Phone: [phone]; Fax: [phone]

## 2019-12-20 ENCOUNTER — Telehealth: Payer: Self-pay | Admitting: Pharmacist

## 2019-12-20 MED ORDER — PRALUENT 75 MG/ML ~~LOC~~ SOAJ: 1.0000 "pen " | SUBCUTANEOUS | 11 refills | Status: DC

## 2019-12-20 NOTE — Telephone Encounter (Signed)
Praluent prior auth approved through 08/28/20. Patient approved for healthwell grant through 11/18/20  Pharmacy Card Id [id number] Group [id number] PCN [id number] BIN [id number]  [facility]  Patient aware and grateful for the help.  I will call patient in 2 months to set up labs.

## 2019-12-24 ENCOUNTER — Encounter: Payer: Self-pay | Admitting: Internal Medicine

## 2019-12-24 ENCOUNTER — Ambulatory Visit (INDEPENDENT_AMBULATORY_CARE_PROVIDER_SITE_OTHER): Payer: Medicare HMO | Admitting: Internal Medicine

## 2019-12-24 ENCOUNTER — Other Ambulatory Visit: Payer: Self-pay | Admitting: Internal Medicine

## 2019-12-24 ENCOUNTER — Other Ambulatory Visit: Payer: Self-pay

## 2019-12-24 VITALS — BP 152/60 | HR 61 | Temp 99.1°F | Ht 66.0 in | Wt 154.0 lb

## 2019-12-24 DIAGNOSIS — Z794 Long term (current) use of insulin: Secondary | ICD-10-CM | POA: Diagnosis not present

## 2019-12-24 DIAGNOSIS — E119 Type 2 diabetes mellitus without complications: Secondary | ICD-10-CM

## 2019-12-24 DIAGNOSIS — E559 Vitamin D deficiency, unspecified: Secondary | ICD-10-CM | POA: Diagnosis not present

## 2019-12-24 DIAGNOSIS — E538 Deficiency of other specified B group vitamins: Secondary | ICD-10-CM

## 2019-12-24 DIAGNOSIS — Z1159 Encounter for screening for other viral diseases: Secondary | ICD-10-CM

## 2019-12-24 DIAGNOSIS — Z0000 Encounter for general adult medical examination without abnormal findings: Secondary | ICD-10-CM

## 2019-12-24 DIAGNOSIS — N183 Chronic kidney disease, stage 3 unspecified: Secondary | ICD-10-CM

## 2019-12-24 DIAGNOSIS — I10 Essential (primary) hypertension: Secondary | ICD-10-CM | POA: Diagnosis not present

## 2019-12-24 DIAGNOSIS — E782 Mixed hyperlipidemia: Secondary | ICD-10-CM

## 2019-12-24 LAB — BASIC METABOLIC PANEL
BUN: 28 mg/dL — ABNORMAL HIGH (ref 6–23)
CO2: 28 mEq/L (ref 19–32)
Calcium: 9.6 mg/dL (ref 8.4–10.5)
Chloride: 107 mEq/L (ref 96–112)
Creatinine, Ser: 1.39 mg/dL — ABNORMAL HIGH (ref 0.40–1.20)
GFR: 45.11 mL/min — ABNORMAL LOW (ref >60.00)
Glucose, Bld: 171 mg/dL — ABNORMAL HIGH (ref 70–99)
Potassium: 4.5 mEq/L (ref 3.5–5.1)
Sodium: 142 mEq/L (ref 135–145)

## 2019-12-24 LAB — CBC WITH DIFFERENTIAL/PLATELET
Basophils Absolute: 0.1 10*3/uL (ref 0.0–0.1)
Basophils Relative: 0.9 % (ref 0.0–3.0)
Eosinophils Absolute: 0 10*3/uL (ref 0.0–0.7)
Eosinophils Relative: 0.6 % (ref 0.0–5.0)
HCT: 43.5 % (ref 36.0–46.0)
Hemoglobin: 14.2 g/dL (ref 12.0–15.0)
Lymphocytes Relative: 31.7 % (ref 12.0–46.0)
Lymphs Abs: 2.5 10*3/uL (ref 0.7–4.0)
MCHC: 32.8 g/dL (ref 30.0–36.0)
MCV: 83.1 fl (ref 78.0–100.0)
Monocytes Absolute: 0.5 10*3/uL (ref 0.1–1.0)
Monocytes Relative: 5.8 % (ref 3.0–12.0)
Neutro Abs: 4.8 10*3/uL (ref 1.4–7.7)
Neutrophils Relative %: 61 % (ref 43.0–77.0)
Platelets: 225 10*3/uL (ref 150.0–400.0)
RBC: 5.23 Mil/uL — ABNORMAL HIGH (ref 3.87–5.11)
RDW: 15.7 % — ABNORMAL HIGH (ref 11.5–15.5)
WBC: 7.8 10*3/uL (ref 4.0–10.5)

## 2019-12-24 LAB — URINALYSIS, ROUTINE W REFLEX MICROSCOPIC
Bilirubin Urine: NEGATIVE
Hgb urine dipstick: NEGATIVE
Ketones, ur: NEGATIVE
Leukocytes,Ua: NEGATIVE
Nitrite: NEGATIVE
RBC / HPF: NONE SEEN (ref 0–?)
Specific Gravity, Urine: 1.015 (ref 1.000–1.030)
Total Protein, Urine: NEGATIVE
Urine Glucose: >=1000 — AB
Urobilinogen, UA: 0.2 (ref 0.0–1.0)
pH: 5 (ref 5.0–8.0)

## 2019-12-24 LAB — LIPID PANEL
Cholesterol: 157 mg/dL (ref 0–200)
HDL: 36 mg/dL — ABNORMAL LOW (ref >39.00)
LDL Cholesterol: 94 mg/dL (ref 0–99)
NonHDL: 121.33
Total CHOL/HDL Ratio: 4
Triglycerides: 139 mg/dL (ref 0.0–149.0)
VLDL: 27.8 mg/dL (ref 0.0–40.0)

## 2019-12-24 LAB — VITAMIN B12: Vitamin B-12: 1020 pg/mL — ABNORMAL HIGH (ref 211–911)

## 2019-12-24 LAB — MICROALBUMIN / CREATININE URINE RATIO
Creatinine,U: 23.8 mg/dL
Microalb Creat Ratio: 9.6 mg/g (ref 0.0–30.0)
Microalb, Ur: 2.3 mg/dL — ABNORMAL HIGH (ref 0.0–1.9)

## 2019-12-24 LAB — HEPATIC FUNCTION PANEL
ALT: 11 U/L (ref 0–35)
AST: 16 U/L (ref 0–37)
Albumin: 4.2 g/dL (ref 3.5–5.2)
Alkaline Phosphatase: 88 U/L (ref 39–117)
Bilirubin, Direct: 0.1 mg/dL (ref 0.0–0.3)
Total Bilirubin: 0.5 mg/dL (ref 0.2–1.2)
Total Protein: 7.1 g/dL (ref 6.0–8.3)

## 2019-12-24 LAB — HEMOGLOBIN A1C: Hgb A1c MFr Bld: 8.9 % — ABNORMAL HIGH (ref 4.6–6.5)

## 2019-12-24 LAB — TSH: TSH: 0.96 u[IU]/mL (ref 0.35–4.50)

## 2019-12-24 MED ORDER — TRAMADOL HCL 50 MG PO TABS: 50.0000 mg | ORAL_TABLET | Freq: Four times a day (QID) | ORAL | 1 refills | Status: AC | PRN

## 2019-12-24 MED ORDER — GLIPIZIDE ER 10 MG PO TB24: 10.0000 mg | ORAL_TABLET | Freq: Every day | ORAL | 3 refills | Status: AC

## 2019-12-24 MED ORDER — DOXAZOSIN MESYLATE 2 MG PO TABS: 2.0000 mg | ORAL_TABLET | Freq: Every day | ORAL | 3 refills | Status: AC

## 2019-12-24 MED ORDER — VITAMIN D (ERGOCALCIFEROL) 1.25 MG (50000 UNIT) PO CAPS: 50000.0000 [IU] | ORAL_CAPSULE | ORAL | 0 refills | Status: DC

## 2019-12-24 NOTE — Assessment & Plan Note (Signed)
For oral replacement 

## 2019-12-24 NOTE — Assessment & Plan Note (Signed)
To begin the praluent son

## 2019-12-24 NOTE — Assessment & Plan Note (Signed)
stable overall by history and exam, recent data reviewed with pt, and pt to continue medical treatment as before,  to f/u any worsening symptoms or concerns  

## 2019-12-24 NOTE — Assessment & Plan Note (Signed)

## 2019-12-24 NOTE — Patient Instructions (Signed)
Please take all new medication as prescribed - the vitamin D, the Praluent for cholesterol, and the cardua for blood pressure  Please continue all other medications as before, and refills have been done if requested.  Please have the pharmacy call with any other refills you may need.  Please continue your efforts at being more active, low cholesterol diet, and weight control.  You are otherwise up to date with prevention measures today.  Please keep your appointments with your specialists as you may have planned  Please go to the LAB at the blood drawing area for the tests to be done  You will be contacted by phone if any changes need to be made immediately.  Otherwise, you will receive a letter about your results with an explanation, but please check with MyChart first.  Please remember to sign up for MyChart if you have not done so, as this will be important to you in the future with finding out test results, communicating by private email, and scheduling acute appointments online when needed.  Please make an Appointment to return in 6 months, or sooner if needed

## 2019-12-24 NOTE — Assessment & Plan Note (Signed)
Restart the cardura 2 mg

## 2019-12-24 NOTE — Progress Notes (Signed)
Subjective:    Patient ID: Brenda Moreno, female    DOB: Jan 09, 1946, 74 y.o.   MRN: [id number]  HPI  Here for wellness and f/u;  Overall doing ok;  Pt denies Chest pain, worsening SOB, DOE, wheezing, orthopnea, PND, worsening LE edema, palpitations, dizziness or syncope.  Pt denies neurological change such as new headache, facial or extremity weakness.  Pt denies polydipsia, polyuria, or low sugar symptoms. Pt states overall good compliance with treatment and medications, good tolerability, and has been trying to follow appropriate diet.  Pt denies worsening depressive symptoms, suicidal ideation or panic. No fever, night sweats, wt loss, loss of appetite, or other constitutional symptoms.  Pt states good ability with ADL's, has low fall risk, home safety reviewed and adequate, no other significant changes in hearing or vision, and only occasionally active with exercise.  ADmits to high chol diet, to start the praluent today.  Did not take the Vit D.HAs not been taking the cardura.   Past Medical History:  Diagnosis Date  . Arthritis   . CAD (coronary artery disease)    S/P cabg  . Carotid artery occlusion   . Cataract   . CHF (congestive heart failure) ([place])   . CKD (chronic kidney disease) 12/21/2018  . DDD (degenerative disc disease), lumbar   . Diabetes mellitus   . Diabetic retinopathy ([place])    NPDR OU  . Hyperlipidemia   . Hypertension   . Hypertensive retinopathy    OU  . Joint pain   . Leg pain   . Myocardial infarction ([place]) 1998  . Peripheral vascular disease ([place])   . PVD (peripheral vascular disease) ([place])   . Stroke [facility])    Past Surgical History:  Procedure Laterality Date  . APPENDECTOMY    . BREAST EXCISIONAL BIOPSY Left 2008  . CHOLECYSTECTOMY     Gall bladder  . COLONOSCOPY  2008  . CORONARY ARTERY BYPASS GRAFT     1998  . LEFT HEART CATH AND CORS/GRAFTS ANGIOGRAPHY N/A 12/22/2017   Procedure: LEFT HEART CATH AND CORS/GRAFTS ANGIOGRAPHY;  Surgeon: Burnell Blanks, MD;  [facility];  Service: Cardiovascular;  Laterality: N/A;  . PR VEIN BYPASS GRAFT,AORTO-FEM-POP    . TEE WITHOUT CARDIOVERSION N/A 01/19/2018   Procedure: TRANSESOPHAGEAL ECHOCARDIOGRAM (TEE);  Surgeon: Josue Hector, MD;  Location: [facility] ENDOSCOPY;  Service: Cardiovascular;  Laterality: N/A;  . TUBAL LIGATION      reports that she quit smoking about 8 years ago. Her smoking use included cigarettes. She smoked 0.25 packs per day. She has never used smokeless tobacco. She reports that she does not drink alcohol or use drugs. family history includes Coronary artery disease in an other family member; Diabetes in her brother, brother, father, mother, and sister; Heart attack in her sister; Heart disease in her father, mother, and sister; Heart disease (age of onset: 28) in her brother; Hyperlipidemia in her brother, father, and mother; Hypertension in her brother, father, mother, and sister; Other in her sister; Stroke in her mother. Allergies  Allergen Reactions  . Levaquin [Levofloxacin In D5w] Other (See Comments)    Pain all over and in joints  . Statins Other (See Comments)    Severe myalgias to lipitor, crestor, pravastatin and weakness and cramping  in bilateral leg.  [name] [Linagliptin] Itching   Current Outpatient Medications on File Prior to Visit  Medication Sig Dispense Refill  . Alirocumab (PRALUENT) 75 MG/ML SOAJ Inject 1 pen into  the skin every 14 (fourteen) days. 2 pen 11  . amLODipine (NORVASC) 5 MG tablet Take 1 tablet (5 mg total) by mouth daily. 90 tablet 1  . Blood Glucose Monitoring Suppl (ONETOUCH VERIO) w/Device KIT 1 Device by Does not apply route in the morning, at noon, and at bedtime. E11.9 1 kit 1  . CINNAMON PO Take 1,000 mg by mouth 2 (two) times daily.      . clopidogrel (PLAVIX) 75 MG tablet TAKE 1 TABLET BY MOUTH EVERY DAY 90 tablet 3  . clotrimazole-betamethasone (LOTRISONE) cream Apply 1 application topically 2 (two) times  daily. 30 g 0  . Coenzyme Q10 (CO Q 10 PO) Take 100 mg by mouth daily.      . fluticasone (FLONASE) 50 MCG/ACT nasal spray Place 2 sprays into both nostrils daily as needed for allergies.    . furosemide (LASIX) 20 MG tablet TAKE 1 TABLET BY MOUTH DAILY AS NEEDED FOR EDEMA 90 tablet 1  . glucose blood (ONETOUCH VERIO) test strip Use as instructed three times daily E11.9 300 each 3  . Insulin Isophane & Regular Human (HUMULIN 70/30 MIX) (70-30) 100 UNIT/ML PEN INJECT 15 UNITS WITH BREAKFAST AND 7 UNITS WITH SUPPER 15 pen 11  . JARDIANCE 25 MG TABS tablet TAKE 1 TABLET BY MOUTH EVERY DAY 30 tablet 5  . Lancets MISC Use as directed three times daily E11.9 300 each 3  . LIVALO 4 MG TABS TAKE 1 TABLET BY MOUTH EVERY DAY 90 tablet 1  . metFORMIN (GLUCOPHAGE) 1000 MG tablet Take 1 tablet (1,000 mg total) by mouth 2 (two) times daily with a meal. 180 tablet 0  . nitroGLYCERIN (NITROSTAT) 0.4 MG SL tablet Place 1 tablet (0.4 mg total) under the tongue every 5 (five) minutes as needed for chest pain. 25 tablet 6  . potassium chloride (KLOR-CON) 10 MEQ tablet Take 20 mEq by mouth daily.    . quinapril (ACCUPRIL) 40 MG tablet Take 1 tablet (40 mg total) by mouth 2 (two) times daily. 180 tablet 3   No current facility-administered medications on file prior to visit.   Review of Systems All otherwise neg per pt     Objective:   Physical Exam BP (!) 152/60 (BP Location: Left Arm, Patient Position: Sitting, Cuff Size: Large)   Pulse 61   Temp 99.1 F (37.3 C) (Oral)   Ht 5' 6"  (1.676 m)   Wt 154 lb (69.9 kg)   SpO2 98%   BMI 24.86 kg/m  VS noted,  Constitutional: Pt appears in NAD HENT: Head: NCAT.  Right Ear: External ear normal.  Left Ear: External ear normal.  Eyes: . Pupils are equal, round, and reactive to light. Conjunctivae and EOM are normal Nose: without d/c or deformity Neck: Neck supple. Gross normal ROM Cardiovascular: Normal rate and regular rhythm.   Pulmonary/Chest: Effort  normal and breath sounds without rales or wheezing.  Abd:  Soft, NT, ND, + BS, no organomegaly Neurological: Pt is alert. At baseline orientation, motor grossly intact Skin: Skin is warm. No rashes, other new lesions, no LE edema Psychiatric: Pt behavior is normal without agitation  All otherwise neg per pt Lab Results  Component Value Date   WBC 7.8 12/24/2019   HGB 14.2 12/24/2019   HCT 43.5 12/24/2019   PLT 225.0 12/24/2019   GLUCOSE 171 (H) 12/24/2019   CHOL 157 12/24/2019   TRIG 139.0 12/24/2019   HDL 36.00 (L) 12/24/2019   LDLCALC 94 12/24/2019   ALT  11 12/24/2019   AST 16 12/24/2019   NA 142 12/24/2019   K 4.5 12/24/2019   CL 107 12/24/2019   CREATININE 1.39 (H) 12/24/2019   BUN 28 (H) 12/24/2019   CO2 28 12/24/2019   TSH 0.96 12/24/2019   INR 0.88 01/16/2018   HGBA1C 8.9 (H) 12/24/2019   MICROALBUR 2.3 (H) 12/24/2019      Assessment & Plan:

## 2019-12-25 LAB — HEPATITIS C ANTIBODY
Hepatitis C Ab: NONREACTIVE
SIGNAL TO CUT-OFF: 0.01 (ref <1.00)

## 2019-12-30 ENCOUNTER — Other Ambulatory Visit: Payer: Self-pay | Admitting: Internal Medicine

## 2020-01-03 ENCOUNTER — Other Ambulatory Visit: Payer: Self-pay | Admitting: Internal Medicine

## 2020-01-03 NOTE — Telephone Encounter (Signed)
Please refill as per office routine med refill policy (all routine meds refilled for 3 mo or monthly per pt preference up to one year from last visit, then month to month grace period for 3 mo, then further med refills will have to be denied)  

## 2020-02-06 DIAGNOSIS — H02834 Dermatochalasis of left upper eyelid: Secondary | ICD-10-CM | POA: Diagnosis not present

## 2020-02-06 DIAGNOSIS — H35033 Hypertensive retinopathy, bilateral: Secondary | ICD-10-CM | POA: Diagnosis not present

## 2020-02-06 DIAGNOSIS — H43813 Vitreous degeneration, bilateral: Secondary | ICD-10-CM | POA: Diagnosis not present

## 2020-02-06 DIAGNOSIS — H04123 Dry eye syndrome of bilateral lacrimal glands: Secondary | ICD-10-CM | POA: Diagnosis not present

## 2020-02-06 DIAGNOSIS — H02831 Dermatochalasis of right upper eyelid: Secondary | ICD-10-CM | POA: Diagnosis not present

## 2020-02-06 DIAGNOSIS — H0102A Squamous blepharitis right eye, upper and lower eyelids: Secondary | ICD-10-CM | POA: Diagnosis not present

## 2020-02-06 DIAGNOSIS — H0102B Squamous blepharitis left eye, upper and lower eyelids: Secondary | ICD-10-CM | POA: Diagnosis not present

## 2020-02-06 DIAGNOSIS — E113293 Type 2 diabetes mellitus with mild nonproliferative diabetic retinopathy without macular edema, bilateral: Secondary | ICD-10-CM | POA: Diagnosis not present

## 2020-02-06 DIAGNOSIS — H2513 Age-related nuclear cataract, bilateral: Secondary | ICD-10-CM | POA: Diagnosis not present

## 2020-02-19 ENCOUNTER — Telehealth: Payer: Self-pay

## 2020-02-19 DIAGNOSIS — E1159 Type 2 diabetes mellitus with other circulatory complications: Secondary | ICD-10-CM | POA: Diagnosis not present

## 2020-02-19 DIAGNOSIS — I69398 Other sequelae of cerebral infarction: Secondary | ICD-10-CM | POA: Diagnosis not present

## 2020-02-19 DIAGNOSIS — E7800 Pure hypercholesterolemia, unspecified: Secondary | ICD-10-CM

## 2020-02-19 DIAGNOSIS — I252 Old myocardial infarction: Secondary | ICD-10-CM | POA: Diagnosis not present

## 2020-02-19 DIAGNOSIS — Z794 Long term (current) use of insulin: Secondary | ICD-10-CM | POA: Diagnosis not present

## 2020-02-19 DIAGNOSIS — E785 Hyperlipidemia, unspecified: Secondary | ICD-10-CM | POA: Diagnosis not present

## 2020-02-19 DIAGNOSIS — E1151 Type 2 diabetes mellitus with diabetic peripheral angiopathy without gangrene: Secondary | ICD-10-CM | POA: Diagnosis not present

## 2020-02-19 DIAGNOSIS — G3184 Mild cognitive impairment, so stated: Secondary | ICD-10-CM | POA: Diagnosis not present

## 2020-02-19 DIAGNOSIS — I2510 Atherosclerotic heart disease of native coronary artery without angina pectoris: Secondary | ICD-10-CM | POA: Diagnosis not present

## 2020-02-19 DIAGNOSIS — I69351 Hemiplegia and hemiparesis following cerebral infarction affecting right dominant side: Secondary | ICD-10-CM | POA: Diagnosis not present

## 2020-02-19 DIAGNOSIS — I10 Essential (primary) hypertension: Secondary | ICD-10-CM | POA: Diagnosis not present

## 2020-02-19 NOTE — Telephone Encounter (Signed)
lmomed for the pt to complete lipid labs. Orders placed

## 2020-02-19 NOTE — Telephone Encounter (Signed)
-----   Message from Ramond Dial, [place] sent at 02/19/2020 11:55 AM EDT -----  ----- Message ----- From: Ramond Dial, RPH-CPP Sent: 02/14/2020 To: Ramond Dial, RPH-CPP  Call and set up lipid labs

## 2020-02-20 ENCOUNTER — Other Ambulatory Visit: Payer: Self-pay | Admitting: Family Medicine

## 2020-03-09 ENCOUNTER — Telehealth: Payer: Self-pay | Admitting: Cardiovascular Disease

## 2020-03-09 DIAGNOSIS — E7800 Pure hypercholesterolemia, unspecified: Secondary | ICD-10-CM

## 2020-03-09 NOTE — Telephone Encounter (Signed)
Pt c/o medication issue:  1. Name of Medication: Alirocumab ([place]) 75 MG/ML SOAJ  2. How are you currently taking this medication (dosage and times per day)? Inject 1 pen every 14 days  3. Are you having a reaction (difficulty breathing--STAT)? no  4. What is your medication issue? Patient states she took the last injection today and would like to know when she needs a follow up appointment

## 2020-03-09 NOTE — Telephone Encounter (Signed)
Scheduled pt for lipid labs on 7/14

## 2020-03-10 ENCOUNTER — Telehealth: Payer: Self-pay

## 2020-03-10 NOTE — Telephone Encounter (Signed)
1.Medication Requested:LIVALO 4 MG TABS - PHARMACY is asking for a statement saying patient can take only this medication due to allegies to all cholesterol medication.   2. Pharmacy (Name, Street, City):[id number] [place] [place] - [address]  3. On Med List: Yes   4. Last Visit with PCP: 4.27.2021   5. Next visit date with PCP:n/a   Agent: Please be advised that RX refills may take up to 3 business days. We ask that you follow-up with your pharmacy.

## 2020-03-11 ENCOUNTER — Other Ambulatory Visit: Payer: Medicare HMO | Admitting: *Deleted

## 2020-03-11 ENCOUNTER — Other Ambulatory Visit: Payer: Self-pay

## 2020-03-11 DIAGNOSIS — E7800 Pure hypercholesterolemia, unspecified: Secondary | ICD-10-CM

## 2020-03-11 LAB — LIPID PANEL
Chol/HDL Ratio: 3.2 ratio (ref 0.0–4.4)
Cholesterol, Total: 155 mg/dL (ref 100–199)
HDL: 48 mg/dL (ref >39)
LDL Chol Calc (NIH): 92 mg/dL (ref 0–99)
Triglycerides: 78 mg/dL (ref 0–149)
VLDL Cholesterol Cal: 15 mg/dL (ref 5–40)

## 2020-03-11 LAB — HEPATIC FUNCTION PANEL
ALT: 9 IU/L (ref 0–32)
AST: 13 IU/L (ref 0–40)
Albumin: 4.1 g/dL (ref 3.7–4.7)
Alkaline Phosphatase: 85 IU/L (ref 48–121)
Bilirubin Total: 0.3 mg/dL (ref 0.0–1.2)
Bilirubin, Direct: 0.11 mg/dL (ref 0.00–0.40)
Total Protein: 6.7 g/dL (ref 6.0–8.5)

## 2020-03-11 NOTE — Telephone Encounter (Signed)
[place] for verbal ok to take this med, thanks

## 2020-03-12 ENCOUNTER — Telehealth: Payer: Self-pay | Admitting: Pharmacist

## 2020-03-12 NOTE — Telephone Encounter (Signed)
Left messages for pt on cell to discuss lab results.  LDL has only dropped by 22% from 118 to 92 since starting Praluent. Need to confirm that pt has been taking Praluent injections every 2 weeks and that she has continued on her Livalo 4mg  daily as we would have expected an additional 40% LDL reduction.  If she has been adherent to both therapies, will increase Praluent dose to 150mg .

## 2020-03-13 ENCOUNTER — Other Ambulatory Visit: Payer: Self-pay | Admitting: Internal Medicine

## 2020-03-14 NOTE — Telephone Encounter (Signed)
Please refill as per office routine med refill policy (all routine meds refilled for 3 mo or monthly per pt preference up to one year from last visit, then month to month grace period for 3 mo, then further med refills will have to be denied)  

## 2020-03-15 ENCOUNTER — Other Ambulatory Visit: Payer: Self-pay | Admitting: Family Medicine

## 2020-03-16 NOTE — Telephone Encounter (Signed)
Called pt back again, left message on both home and cell #.

## 2020-03-16 NOTE — Telephone Encounter (Signed)
Pt called back returning a call about her lab results. She apologizes for not answering last week because her phone was out. Please call again to discuss results.

## 2020-03-17 ENCOUNTER — Encounter: Payer: Self-pay | Admitting: *Deleted

## 2020-03-17 NOTE — Telephone Encounter (Signed)
Spoke with the [employer] and she has stated how LIVALO 4mg  Tab need a PA due to the fact the pts insurance will not cover this med w/o a PA.

## 2020-03-19 NOTE — Telephone Encounter (Signed)
5th attempt made to contact pt. Will await her return call.

## 2020-03-23 MED ORDER — PRALUENT 75 MG/ML ~~LOC~~ SOAJ: 1.0000 "pen " | SUBCUTANEOUS | 3 refills | Status: AC

## 2020-03-23 MED ORDER — LIVALO 4 MG PO TABS: 1.0000 | ORAL_TABLET | Freq: Every day | ORAL | 3 refills | Status: DC

## 2020-03-23 NOTE — Addendum Note (Signed)
Addended by: Marcelle Overlie D on: 03/23/2020 04:35 PM   Modules accepted: Orders

## 2020-03-23 NOTE — Telephone Encounter (Signed)
Patient called back. States she has been out of her Livalo. Waiting on prescriber to do PA. She was not on Livalo while labs drawn. Probably had been out about a week per patient. We will renew patients prior auth for Livalo. Will resume Livalo and continue praluent and recheck in 3 months  Addendum: Prior auth for livalo approved Rx sent to pharmacy. Healthwell info resent as well Patient made aware

## 2020-03-23 NOTE — Telephone Encounter (Signed)
Brenda Moreno is returning Brenda Moreno's call. She states if you are unable to reach her to please leave a detailed message with the results and how she is to continue medications and she will callback with questions. Please advise.

## 2020-03-23 NOTE — Telephone Encounter (Signed)
Attempted to call pt cell phone and leave message but mailbox was full. Called home #- spoke to a gentleman who stated she wasn't home. Left message for her to call back at direct line [phone].

## 2020-03-25 ENCOUNTER — Other Ambulatory Visit: Payer: Self-pay

## 2020-03-25 MED ORDER — LIVALO 4 MG PO TABS: 4.0000 mg | ORAL_TABLET | Freq: Every day | ORAL | 3 refills | Status: AC

## 2020-03-30 ENCOUNTER — Other Ambulatory Visit: Payer: Self-pay | Admitting: Internal Medicine

## 2020-03-30 ENCOUNTER — Other Ambulatory Visit: Payer: Self-pay | Admitting: Family Medicine

## 2020-03-30 NOTE — Telephone Encounter (Signed)
Please change to OTC Vitamin D3 at 2000 units per day, indefinitely.  

## 2020-04-16 ENCOUNTER — Other Ambulatory Visit: Payer: Self-pay

## 2020-04-16 ENCOUNTER — Ambulatory Visit (INDEPENDENT_AMBULATORY_CARE_PROVIDER_SITE_OTHER): Payer: Medicare HMO | Admitting: Internal Medicine

## 2020-04-16 ENCOUNTER — Encounter: Payer: Self-pay | Admitting: Internal Medicine

## 2020-04-16 VITALS — BP 100/50 | HR 69 | Temp 98.0°F | Ht 66.0 in | Wt 150.0 lb

## 2020-04-16 DIAGNOSIS — M7712 Lateral epicondylitis, left elbow: Secondary | ICD-10-CM

## 2020-04-16 DIAGNOSIS — M5432 Sciatica, left side: Secondary | ICD-10-CM | POA: Diagnosis not present

## 2020-04-16 DIAGNOSIS — I10 Essential (primary) hypertension: Secondary | ICD-10-CM

## 2020-04-16 MED ORDER — GABAPENTIN 100 MG PO CAPS
100.0000 mg | ORAL_CAPSULE | Freq: Three times a day (TID) | ORAL | 3 refills | Status: AC
Start: 2020-04-16 — End: ?

## 2020-04-16 NOTE — Patient Instructions (Signed)
Please take all new medication as prescribed - the gabapentin at 100 mg three times per day for the left sciatica pain  OK to use the OTC Voltaren gel or the other treatment you mentioned for the left arm pain  Please continue all other medications as before, and refills have been done if requested.  Please have the pharmacy call with any other refills you may need.  Please keep your appointments with your specialists as you may have planned  You will be contacted regarding the referral for: Sports Medicine (on the first floor)

## 2020-04-18 ENCOUNTER — Encounter: Payer: Self-pay | Admitting: Internal Medicine

## 2020-04-18 NOTE — Assessment & Plan Note (Signed)
Mild, likely traumatic or repetitive related, for otc volt gel prn

## 2020-04-18 NOTE — Assessment & Plan Note (Addendum)
D/w pt, ok for gabapentin 100 tid, and f/u sport med if not improving  I spent 31 minutes in preparing to see the patient by review of recent labs, imaging and procedures, obtaining and reviewing separately obtained history, communicating with the patient and family or caregiver, ordering medications, tests or procedures, and documenting clinical information in the EHR including the differential Dx, treatment, and any further evaluation and other management of left sciatica, left lateral epincondylitis, and htn

## 2020-04-18 NOTE — Progress Notes (Signed)
Subjective:    Patient ID: Brenda Moreno, female    DOB: 07-Jun-1946, 74 y.o.   MRN: [id number]  HPI  Here to f/u; overall doing ok,  Pt denies chest pain, increasing sob or doe, wheezing, orthopnea, PND, increased LE swelling, palpitations, dizziness or syncope.  Pt denies new neurological symptoms such as new headache, or facial or extremity weakness or numbness.  Pt denies polydipsia, polyuria, also here wondering why she has ongoing pain for the past wk only on the left side and points to tender left lateral epicondylar area with slight puffiness, and also c/o left lower back pain neuritic type to the left buttock and radiation to below the knee but without numbness or weakness.  Did have a recent fall but did not have pain at that time, just started the day after.  No further falls. Past Medical History:  Diagnosis Date  . Arthritis   . CAD (coronary artery disease)    S/P cabg  . Carotid artery occlusion   . Cataract   . CHF (congestive heart failure) ([place])   . CKD (chronic kidney disease) 12/21/2018  . DDD (degenerative disc disease), lumbar   . Diabetes mellitus   . Diabetic retinopathy ([place])    NPDR OU  . Hyperlipidemia   . Hypertension   . Hypertensive retinopathy    OU  . Joint pain   . Leg pain   . Myocardial infarction ([place]) 1998  . Peripheral vascular disease ([place])   . PVD (peripheral vascular disease) ([place])   . Stroke [facility])    Past Surgical History:  Procedure Laterality Date  . APPENDECTOMY    . BREAST EXCISIONAL BIOPSY Left 2008  . CHOLECYSTECTOMY     Gall bladder  . COLONOSCOPY  2008  . CORONARY ARTERY BYPASS GRAFT     1998  . LEFT HEART CATH AND CORS/GRAFTS ANGIOGRAPHY N/A 12/22/2017   Procedure: LEFT HEART CATH AND CORS/GRAFTS ANGIOGRAPHY;  Surgeon: Burnell Blanks, MD;  Location: [place] [facility];  Service: Cardiovascular;  Laterality: N/A;  . PR VEIN BYPASS GRAFT,AORTO-FEM-POP    . TEE WITHOUT CARDIOVERSION N/A 01/19/2018   Procedure:  TRANSESOPHAGEAL ECHOCARDIOGRAM (TEE);  Surgeon: Josue Hector, MD;  Location: [facility] ENDOSCOPY;  Service: Cardiovascular;  Laterality: N/A;  . TUBAL LIGATION      reports that she quit smoking about 8 years ago. Her smoking use included cigarettes. She smoked 0.25 packs per day. She has never used smokeless tobacco. She reports that she does not drink alcohol and does not use drugs. family history includes Coronary artery disease in an other family member; Diabetes in her brother, brother, father, mother, and sister; Heart attack in her sister; Heart disease in her father, mother, and sister; Heart disease (age of onset: 64) in her brother; Hyperlipidemia in her brother, father, and mother; Hypertension in her brother, father, mother, and sister; Other in her sister; Stroke in her mother. Allergies  Allergen Reactions  . Levaquin [Levofloxacin In D5w] Other (See Comments)    Pain all over and in joints  . Statins Other (See Comments)    Severe myalgias to lipitor, crestor, pravastatin and weakness and cramping  in bilateral leg.  [name] [Linagliptin] Itching   Current Outpatient Medications on File Prior to Visit  Medication Sig Dispense Refill  . Alirocumab (PRALUENT) 75 MG/ML SOAJ Inject 1 pen into the skin every 14 (fourteen) days. 6 mL 3  . amLODipine (NORVASC) 5 MG tablet TAKE 1 TABLET BY MOUTH EVERY  DAY 90 tablet 1  . Blood Glucose Monitoring Suppl (ONETOUCH VERIO) w/Device KIT 1 Device by Does not apply route in the morning, at noon, and at bedtime. E11.9 1 kit 1  . CINNAMON PO Take 1,000 mg by mouth 2 (two) times daily.      . clopidogrel (PLAVIX) 75 MG tablet TAKE 1 TABLET BY MOUTH EVERY DAY 90 tablet 3  . clotrimazole-betamethasone (LOTRISONE) cream Apply 1 application topically 2 (two) times daily. 30 g 0  . Coenzyme Q10 (CO Q 10 PO) Take 100 mg by mouth daily.      Marland Kitchen doxazosin (CARDURA) 2 MG tablet Take 1 tablet (2 mg total) by mouth daily. 90 tablet 3  . fluticasone (FLONASE)  50 MCG/ACT nasal spray Place 2 sprays into both nostrils daily as needed for allergies.    . furosemide (LASIX) 20 MG tablet TAKE 1 TABLET BY MOUTH DAILY AS NEEDED FOR EDEMA 90 tablet 1  . glipiZIDE (GLUCOTROL XL) 10 MG 24 hr tablet Take 1 tablet (10 mg total) by mouth daily with breakfast. 90 tablet 3  . glucose blood (ONETOUCH VERIO) test strip Use as instructed three times daily E11.9 300 each 3  . Insulin Isophane & Regular Human (HUMULIN 70/30 MIX) (70-30) 100 UNIT/ML PEN INJECT 15 UNITS WITH BREAKFAST AND 7 UNITS WITH SUPPER 15 pen 11  . JARDIANCE 25 MG TABS tablet TAKE 1 TABLET BY MOUTH EVERY DAY 30 tablet 5  . Lancets MISC Use as directed three times daily E11.9 300 each 3  . metFORMIN (GLUCOPHAGE) 1000 MG tablet Take 1 tablet (1,000 mg total) by mouth 2 (two) times daily with a meal. 180 tablet 0  . nitroGLYCERIN (NITROSTAT) 0.4 MG SL tablet Place 1 tablet (0.4 mg total) under the tongue every 5 (five) minutes as needed for chest pain. 25 tablet 6  . Pitavastatin Calcium (LIVALO) 4 MG TABS Take 1 tablet (4 mg total) by mouth daily. 90 tablet 3  . potassium chloride (KLOR-CON) 10 MEQ tablet Take 2 tablets (20 mEq total) by mouth daily. 180 tablet 1  . quinapril (ACCUPRIL) 40 MG tablet TAKE 1 TABLET BY MOUTH TWICE A DAY 180 tablet 3  . traMADol (ULTRAM) 50 MG tablet Take 1 tablet (50 mg total) by mouth every 6 (six) hours as needed. 30 tablet 1   No current facility-administered medications on file prior to visit.   Review of Systems All otherwise neg per pt    Objective:   Physical Exam BP (!) 100/50 (BP Location: Left Arm, Patient Position: Sitting, Cuff Size: Large)   Pulse 69   Temp 98 F (36.7 C) (Oral)   Ht 5' 6"  (1.676 m)   Wt 150 lb (68 kg)   SpO2 97%   BMI 24.21 kg/m  VS noted,  Constitutional: Pt appears in NAD HENT: Head: NCAT.  Right Ear: External ear normal.  Left Ear: External ear normal.  Eyes: . Pupils are equal, round, and reactive to light. Conjunctivae and  EOM are normal Nose: without d/c or deformity Neck: Neck supple. Gross normal ROM Cardiovascular: Normal rate and regular rhythm.   Pulmonary/Chest: Effort normal and breath sounds without rales or wheezing.  Left elbow with tender left lateral epicondylar area mild Abd:  Soft, NT, ND, + BS, no organomegaly Neurological: Pt is alert. At baseline orientation, motor grossly intact Skin: Skin is warm. No rashes, other new lesions, no LE edema Psychiatric: Pt behavior is normal without agitation  All otherwise neg per pt Lab  Results  Component Value Date   WBC 7.8 12/24/2019   HGB 14.2 12/24/2019   HCT 43.5 12/24/2019   PLT 225.0 12/24/2019   GLUCOSE 171 (H) 12/24/2019   CHOL 155 03/11/2020   TRIG 78 03/11/2020   HDL 48 03/11/2020   LDLCALC 92 03/11/2020   ALT 9 03/11/2020   AST 13 03/11/2020   NA 142 12/24/2019   K 4.5 12/24/2019   CL 107 12/24/2019   CREATININE 1.39 (H) 12/24/2019   BUN 28 (H) 12/24/2019   CO2 28 12/24/2019   TSH 0.96 12/24/2019   INR 0.88 01/16/2018   HGBA1C 8.9 (H) 12/24/2019   MICROALBUR 2.3 (H) 12/24/2019         Assessment & Plan:

## 2020-04-18 NOTE — Assessment & Plan Note (Signed)
stable overall by history and exam, recent data reviewed with pt, and pt to continue medical treatment as before,  to f/u any worsening symptoms or concerns  

## 2020-04-23 ENCOUNTER — Other Ambulatory Visit: Payer: Self-pay

## 2020-04-23 ENCOUNTER — Ambulatory Visit: Payer: Medicare HMO | Admitting: Family Medicine

## 2020-04-23 ENCOUNTER — Ambulatory Visit (INDEPENDENT_AMBULATORY_CARE_PROVIDER_SITE_OTHER): Payer: Medicare HMO

## 2020-04-23 ENCOUNTER — Ambulatory Visit: Payer: Self-pay

## 2020-04-23 VITALS — BP 128/58 | HR 70 | Ht 66.0 in

## 2020-04-23 DIAGNOSIS — M5136 Other intervertebral disc degeneration, lumbar region: Secondary | ICD-10-CM | POA: Diagnosis not present

## 2020-04-23 DIAGNOSIS — I739 Peripheral vascular disease, unspecified: Secondary | ICD-10-CM

## 2020-04-23 DIAGNOSIS — M5416 Radiculopathy, lumbar region: Secondary | ICD-10-CM

## 2020-04-23 DIAGNOSIS — I700 Atherosclerosis of aorta: Secondary | ICD-10-CM | POA: Insufficient documentation

## 2020-04-23 DIAGNOSIS — G5632 Lesion of radial nerve, left upper limb: Secondary | ICD-10-CM

## 2020-04-23 DIAGNOSIS — M25522 Pain in left elbow: Secondary | ICD-10-CM | POA: Diagnosis not present

## 2020-04-23 NOTE — Patient Instructions (Addendum)
Thank you for coming in today. Plan for xray today.  Plan for physical therapy.  Continue gabapentin as needed. That dose could be increased if we need to.  Recheck back in about 1 month.  If worsening let me know sooner.    Radial Tunnel Syndrome Rehab Ask your health care provider which exercises are safe for you. Do exercises exactly as told by your health care provider and adjust them as directed. It is normal to feel mild stretching, pulling, tightness, or discomfort as you do these exercises. Stop right away if you feel sudden pain or your pain gets worse. Do not begin these exercises until told by your health care provider. Stretching and range-of-motion exercises These exercises warm up your muscles and joints and improve the movement and flexibility of your forearm. These exercises also help to relieve pain, numbness, and tingling. Wrist flexion, assisted  1. Bend your left / right arm at your side and turn your palm down toward the floor. ? If told by your health care provider, straighten your left / right elbow in front of you to increase the amount of stretch. 2. Using your uninjured hand, gently press over the back of your hand (assisted) to bend your wrist and fingers toward the floor (flexion). Go as far as you can to feel a stretch without causing pain. 3. Hold this position for __________ seconds. 4. Slowly return to the starting position. Repeat __________ times. Complete this exercise __________ times a day. Wrist extension, assisted  1. Bend your left / right arm at your side and turn your palm up toward the ceiling. ? If told by your health care provider, straighten your left / right elbow in front of you to increase the amount of stretch. 2. Using your uninjured hand, gently press over the palm of your hand (assisted) to bend your wrist and fingers toward the floor (extension). Go as far as you can to feel a stretch without causing pain. 3. Hold this position for  __________ seconds. 4. Slowly return to the starting position. Repeat __________ times. Complete this exercise __________ times a day. Wrist flexion, assisted Do not do this exercise until told to do so by your health care provider. 1. Stand over a tabletop with your left / right hand resting palm-up on the tabletop and your fingers pointing away from your body. Your arm should be extended, and there should be a slight bend in your elbow. 2. Gently press the back of your hand down (assisted flexion) by straightening your elbow. You should feel a stretch in the top of your forearm. 3. Hold this position for __________ seconds. 4. Slowly return to the starting position. Repeat __________ times. Complete this exercise __________ times a day. Radial nerve glide Do not exercise to the point where you increase pain. Stop at the position where you start to feel a stretch, or tension, in the back of your forearm. Hold that position without going farther. You may not be able to do the full exercise right away. 1. Stand with your left / right arm hanging at your side. Maintain good posture throughout the exercise. Move to each new position very slowly and gently to avoid further irritation of your nerve. 2. Push (depress) your left / right shoulder toward the floor without moving your body. 3. Turn your arm so your palm faces behind you, and bend (flex) your wrist so your fingers point straight backward. 4. Maintaining your shoulder and arm position, twist your arm  so that your fingers point to the left / right. 5. Maintaining your shoulder, arm, and hand position, reach your arm backward as far as you can. Think of reaching behind your back without moving your body. 6. Maintaining this last position, slowly and gently tilt your head toward the opposite shoulder. 7. Hold this position for __________ seconds. 8. Slowly return to the starting position. Repeat __________ times. Complete this exercise  __________ times a day. This information is not intended to replace advice given to you by your health care provider. Make sure you discuss any questions you have with your health care provider. Document Revised: 12/07/2018 Document Reviewed: 09/06/2018 Elsevier Patient Education  [place].    Sciatica  Sciatica is pain, weakness, tingling, or loss of feeling (numbness) along the sciatic nerve. The sciatic nerve starts in the lower back and goes down the back of each leg. Sciatica usually goes away on its own or with treatment. Sometimes, sciatica may come back (recur). What are the causes? This condition happens when the sciatic nerve is pinched or has pressure put on it. This may be the result of:  A disk in between the bones of the spine bulging out too far (herniated disk).  Changes in the spinal disks that occur with aging.  A condition that affects a muscle in the butt.  Extra bone growth near the sciatic nerve.  A break (fracture) of the area between your hip bones (pelvis).  Pregnancy.  Tumor. This is rare. What increases the risk? You are more likely to develop this condition if you:  Play sports that put pressure or stress on the spine.  Have poor strength and ease of movement (flexibility).  Have had a back injury in the past.  Have had back surgery.  Sit for long periods of time.  Do activities that involve bending or lifting over and over again.  Are very overweight (obese). What are the signs or symptoms? Symptoms can vary from mild to very bad. They may include:  Any of these problems in the lower back, leg, hip, or butt: ? Mild tingling, loss of feeling, or dull aches. ? Burning sensations. ? Sharp pains.  Loss of feeling in the back of the calf or the sole of the foot.  Leg weakness.  Very bad back pain that makes it hard to move. These symptoms may get worse when you cough, sneeze, or laugh. They may also get worse when you sit or  stand for long periods of time. How is this treated? This condition often gets better without any treatment. However, treatment may include:  Changing or cutting back on physical activity when you have pain.  Doing exercises and stretching.  Putting ice or heat on the affected area.  Medicines that help: ? To relieve pain and swelling. ? To relax your muscles.  Shots (injections) of medicines that help to relieve pain, irritation, and swelling.  Surgery. Follow these instructions at home: Medicines  Take over-the-counter and prescription medicines only as told by your doctor.  Ask your doctor if the medicine prescribed to you: ? Requires you to avoid driving or using heavy machinery. ? Can cause trouble pooping (constipation). You may need to take these steps to prevent or treat trouble pooping:  Drink enough fluids to keep your pee (urine) pale yellow.  Take over-the-counter or prescription medicines.  Eat foods that are high in fiber. These include beans, whole grains, and fresh fruits and vegetables.  Limit foods that  are high in fat and sugar. These include fried or sweet foods. Managing pain      If told, put ice on the affected area. ? Put ice in a plastic bag. ? Place a towel between your skin and the bag. ? Leave the ice on for 20 minutes, 2-3 times a day.  If told, put heat on the affected area. Use the heat source that your doctor tells you to use, such as a moist heat pack or a heating pad. ? Place a towel between your skin and the heat source. ? Leave the heat on for 20-30 minutes. ? Remove the heat if your skin turns bright red. This is very important if you are unable to feel pain, heat, or cold. You may have a greater risk of getting burned. Activity   Return to your normal activities as told by your doctor. Ask your doctor what activities are safe for you.  Avoid activities that make your symptoms worse.  Take short rests during the day. ? When  you rest for a long time, do some physical activity or stretching between periods of rest. ? Avoid sitting for a long time without moving. Get up and move around at least one time each hour.  Exercise and stretch regularly, as told by your doctor.  Do not lift anything that is heavier than 10 lb (4.5 kg) while you have symptoms of sciatica. ? Avoid lifting heavy things even when you do not have symptoms. ? Avoid lifting heavy things over and over.  When you lift objects, always lift in a way that is safe for your body. To do this, you should: ? Bend your knees. ? Keep the object close to your body. ? Avoid twisting. General instructions  Stay at a healthy weight.  Wear comfortable shoes that support your feet. Avoid wearing high heels.  Avoid sleeping on a mattress that is too soft or too hard. You might have less pain if you sleep on a mattress that is firm enough to support your back.  Keep all follow-up visits as told by your doctor. This is important. Contact a doctor if:  You have pain that: ? Wakes you up when you are sleeping. ? Gets worse when you lie down. ? Is worse than the pain you have had in the past. ? Lasts longer than 4 weeks.  You lose weight without trying. Get help right away if:  You cannot control when you pee (urinate) or poop (have a bowel movement).  You have weakness in any of these areas and it gets worse: ? Lower back. ? The area between your hip bones. ? Butt. ? Legs.  You have redness or swelling of your back.  You have a burning feeling when you pee. Summary  Sciatica is pain, weakness, tingling, or loss of feeling (numbness) along the sciatic nerve.  This condition happens when the sciatic nerve is pinched or has pressure put on it.  Sciatica can cause pain, tingling, or loss of feeling (numbness) in the lower back, legs, hips, and butt.  Treatment often includes rest, exercise, medicines, and putting ice or heat on the affected  area. This information is not intended to replace advice given to you by your health care provider. Make sure you discuss any questions you have with your health care provider. Document Revised: 09/03/2018 Document Reviewed: 09/03/2018 Elsevier Patient Education  [place].

## 2020-04-23 NOTE — Progress Notes (Signed)
Subjective:    CC: L elbow and L-sided LBP/sciatica  I, Brenda Moreno, LAT, ATC, am serving as scribe for Dr. Lynne Moreno.  HPI: Pt is a 74 y/o female presenting w/ c/o L elbow and L-sided sciatica. Pain intermittent for past 3 months. Pain increases with driving and located to lateral epicondyle. 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  Also having lower back pain, left side. Radiating pain down the back of left leg. Unable to stand for prolonged periods. Had epidural injection through [employer]. Just started gabapentin.   L elbow:  -Swelling: yes at L lateral elbow -Aggravating factors: Worse with activity -Treatments tried: Rest  Low back pain: -Radiating pain: yes into her L LE -L LE numbness/tingling: Present down posterior thigh and posterior lower leg to lateral ankle -Aggravating factors: Prolonged standing -Treatments tried: Rest and gabapentin.  Gabapentin helps.  Pertinent review of Systems: No fevers or chills  Relevant historical information: History of stroke involving weakness left side.  Hypertension CAD and diabetes.   Objective:    Vitals:   04/23/20 1318  BP: (!) 128/58  Pulse: 70  SpO2: 98%   General: Well Developed, well nourished, and in no acute distress.   MSK: C-spine normal-appearing nontender normal cervical motion. Left elbow forearm normal-appearing normal elbow motion. Nontender lateral epicondyle.  Tender palpation muscle belly dorsal forearm. No significant pain with resisted wrist extension. Pulses cap refill and sensation are intact distally.  L-spine normal-appearing Nontender midline. Lumbar range of motion normal flexion limited extension. Negative left-sided slump test. Mostly strength reflexes and sensation are intact.  Lab and Radiology Results  X-ray images L-spine obtained today personally and independently reviewed Significant DDD.  No fracture or significant malalignment.  Profound  aortic and iliac atherosclerosis present. Await for radiology review  MRI images L-spine dated 2013 personally individually reviewed.  No significant neurocompression at left S1 nerve root.  Diagnostic Limited MSK Ultrasound of: Left lateral elbow and forearm Normal-appearing lateral epicondyle and radial head. Dorsal interosseous nerve at the arcade de frohse identified and forearm.  Direct pressure with ultrasound probe reproduces pain in forearm. Impression: Radial tunnel syndrome.  Doubtful for lateral epicondylitis    Impression and Recommendations:    Assessment and Plan: 74 y.o. female with left forearm pain most consistent with radial tunnel syndrome based on physical ultrasound examination.  Discussed options.  Patient would like to avoid steroid injection as this has caused her blood sugar to increase in the past..  Plan for trial of physical therapy.  Recheck in 1 month.  Left leg pain.  Consistent with sciatica.  Prior MRI from 2013 did not show significant changes or nerve compression at this level.  Plan for x-ray today and again physical therapy and gabapentin.  Avoid steroids due to diabetes.  Recheck in 1 month.  If not improved at that point proceed with MRI for epidural steroid injection planning.  Precautions reviewed.  Atherosclerosis seen on abdominal aorta and iliac artery on x-ray.  Patient has known PVD.  Currently managed appropriately.  Discuss further with PCP if needed.   Orders Placed This Encounter  Procedures  . [place] LIMITED JOINT SPACE STRUCTURES LOW LEFT(NO LINKED CHARGES)    Order Specific Question:   Reason for Exam (SYMPTOM  OR DIAGNOSIS REQUIRED)    Answer:   left elbow pain    Order Specific Question:   Preferred imaging location?    Answer:   [place]  .  DG Lumbar Spine 2-3 Views    Standing Status:   Future    Number of Occurrences:   1    Standing Expiration Date:   04/23/2021    Order Specific Question:   Reason for  Exam (SYMPTOM  OR DIAGNOSIS REQUIRED)    Answer:   eval left sicatica    Order Specific Question:   Preferred imaging location?    Answer:   [facility]    Order Specific Question:   Radiology Contrast Protocol - do NOT remove file path    Answer:   [email or web address]  . Ambulatory referral to Physical Therapy    Referral Priority:   Routine    Referral Type:   Physical Medicine    Referral Reason:   Specialty Services Required    Requested Specialty:   Physical Therapy   No orders of the defined types were placed in this encounter.   Discussed warning signs or symptoms. Please see discharge instructions. Patient expresses understanding.   The above documentation has been reviewed and is accurate and complete Brenda Moreno, M.D.

## 2020-04-24 NOTE — Progress Notes (Signed)
Arthritis present in the low back.

## 2020-05-04 ENCOUNTER — Other Ambulatory Visit: Payer: Self-pay

## 2020-05-04 ENCOUNTER — Inpatient Hospital Stay (HOSPITAL_COMMUNITY): Payer: Medicare HMO

## 2020-05-04 ENCOUNTER — Emergency Department (HOSPITAL_COMMUNITY): Payer: Medicare HMO

## 2020-05-04 ENCOUNTER — Telehealth: Payer: Self-pay | Admitting: Internal Medicine

## 2020-05-04 ENCOUNTER — Inpatient Hospital Stay (HOSPITAL_COMMUNITY)
Admission: EM | Admit: 2020-05-04 | Discharge: 2020-05-29 | DRG: 296 | Disposition: E | Payer: Medicare HMO | Attending: Internal Medicine | Admitting: Internal Medicine

## 2020-05-04 DIAGNOSIS — T5891XA Toxic effect of carbon monoxide from unspecified source, accidental (unintentional), initial encounter: Secondary | ICD-10-CM

## 2020-05-04 DIAGNOSIS — M1990 Unspecified osteoarthritis, unspecified site: Secondary | ICD-10-CM | POA: Diagnosis present

## 2020-05-04 DIAGNOSIS — T1490XA Injury, unspecified, initial encounter: Secondary | ICD-10-CM

## 2020-05-04 DIAGNOSIS — N179 Acute kidney failure, unspecified: Secondary | ICD-10-CM | POA: Diagnosis present

## 2020-05-04 DIAGNOSIS — Z4682 Encounter for fitting and adjustment of non-vascular catheter: Secondary | ICD-10-CM | POA: Diagnosis not present

## 2020-05-04 DIAGNOSIS — E1151 Type 2 diabetes mellitus with diabetic peripheral angiopathy without gangrene: Secondary | ICD-10-CM | POA: Diagnosis not present

## 2020-05-04 DIAGNOSIS — T2000XA Burn of unspecified degree of head, face, and neck, unspecified site, initial encounter: Secondary | ICD-10-CM | POA: Diagnosis present

## 2020-05-04 DIAGNOSIS — I509 Heart failure, unspecified: Secondary | ICD-10-CM | POA: Diagnosis present

## 2020-05-04 DIAGNOSIS — Z888 Allergy status to other drugs, medicaments and biological substances status: Secondary | ICD-10-CM

## 2020-05-04 DIAGNOSIS — T2020XA Burn of second degree of head, face, and neck, unspecified site, initial encounter: Secondary | ICD-10-CM

## 2020-05-04 DIAGNOSIS — E1122 Type 2 diabetes mellitus with diabetic chronic kidney disease: Secondary | ICD-10-CM | POA: Diagnosis present

## 2020-05-04 DIAGNOSIS — Z833 Family history of diabetes mellitus: Secondary | ICD-10-CM

## 2020-05-04 DIAGNOSIS — I130 Hypertensive heart and chronic kidney disease with heart failure and stage 1 through stage 4 chronic kidney disease, or unspecified chronic kidney disease: Secondary | ICD-10-CM | POA: Diagnosis not present

## 2020-05-04 DIAGNOSIS — I252 Old myocardial infarction: Secondary | ICD-10-CM

## 2020-05-04 DIAGNOSIS — Z9049 Acquired absence of other specified parts of digestive tract: Secondary | ICD-10-CM

## 2020-05-04 DIAGNOSIS — I2510 Atherosclerotic heart disease of native coronary artery without angina pectoris: Secondary | ICD-10-CM | POA: Diagnosis present

## 2020-05-04 DIAGNOSIS — Z20822 Contact with and (suspected) exposure to covid-19: Secondary | ICD-10-CM | POA: Diagnosis not present

## 2020-05-04 DIAGNOSIS — I469 Cardiac arrest, cause unspecified: Secondary | ICD-10-CM | POA: Diagnosis not present

## 2020-05-04 DIAGNOSIS — X000XXA Exposure to flames in uncontrolled fire in building or structure, initial encounter: Secondary | ICD-10-CM | POA: Diagnosis present

## 2020-05-04 DIAGNOSIS — N1831 Chronic kidney disease, stage 3a: Secondary | ICD-10-CM | POA: Diagnosis present

## 2020-05-04 DIAGNOSIS — E785 Hyperlipidemia, unspecified: Secondary | ICD-10-CM | POA: Diagnosis present

## 2020-05-04 DIAGNOSIS — X088XXA Exposure to other specified smoke, fire and flames, initial encounter: Secondary | ICD-10-CM

## 2020-05-04 DIAGNOSIS — R569 Unspecified convulsions: Secondary | ICD-10-CM

## 2020-05-04 DIAGNOSIS — G9382 Brain death: Secondary | ICD-10-CM | POA: Diagnosis not present

## 2020-05-04 DIAGNOSIS — I6529 Occlusion and stenosis of unspecified carotid artery: Secondary | ICD-10-CM | POA: Diagnosis present

## 2020-05-04 DIAGNOSIS — Z951 Presence of aortocoronary bypass graft: Secondary | ICD-10-CM

## 2020-05-04 DIAGNOSIS — T59811A Toxic effect of smoke, accidental (unintentional), initial encounter: Secondary | ICD-10-CM | POA: Diagnosis present

## 2020-05-04 DIAGNOSIS — Z7984 Long term (current) use of oral hypoglycemic drugs: Secondary | ICD-10-CM

## 2020-05-04 DIAGNOSIS — Z87891 Personal history of nicotine dependence: Secondary | ICD-10-CM

## 2020-05-04 DIAGNOSIS — Z8673 Personal history of transient ischemic attack (TIA), and cerebral infarction without residual deficits: Secondary | ICD-10-CM

## 2020-05-04 DIAGNOSIS — I700 Atherosclerosis of aorta: Secondary | ICD-10-CM | POA: Diagnosis not present

## 2020-05-04 DIAGNOSIS — Z79899 Other long term (current) drug therapy: Secondary | ICD-10-CM

## 2020-05-04 DIAGNOSIS — Z823 Family history of stroke: Secondary | ICD-10-CM

## 2020-05-04 DIAGNOSIS — J9601 Acute respiratory failure with hypoxia: Secondary | ICD-10-CM | POA: Diagnosis not present

## 2020-05-04 DIAGNOSIS — Z8249 Family history of ischemic heart disease and other diseases of the circulatory system: Secondary | ICD-10-CM

## 2020-05-04 DIAGNOSIS — T310 Burns involving less than 10% of body surface: Secondary | ICD-10-CM | POA: Diagnosis present

## 2020-05-04 DIAGNOSIS — M5136 Other intervertebral disc degeneration, lumbar region: Secondary | ICD-10-CM | POA: Diagnosis present

## 2020-05-04 DIAGNOSIS — E11319 Type 2 diabetes mellitus with unspecified diabetic retinopathy without macular edema: Secondary | ICD-10-CM | POA: Diagnosis present

## 2020-05-04 DIAGNOSIS — T22251A Burn of second degree of right shoulder, initial encounter: Secondary | ICD-10-CM | POA: Diagnosis not present

## 2020-05-04 DIAGNOSIS — E874 Mixed disorder of acid-base balance: Secondary | ICD-10-CM | POA: Diagnosis present

## 2020-05-04 DIAGNOSIS — R34 Anuria and oliguria: Secondary | ICD-10-CM | POA: Diagnosis present

## 2020-05-04 DIAGNOSIS — R918 Other nonspecific abnormal finding of lung field: Secondary | ICD-10-CM | POA: Diagnosis not present

## 2020-05-04 DIAGNOSIS — Z9289 Personal history of other medical treatment: Secondary | ICD-10-CM

## 2020-05-04 DIAGNOSIS — T22252A Burn of second degree of left shoulder, initial encounter: Secondary | ICD-10-CM | POA: Diagnosis not present

## 2020-05-04 DIAGNOSIS — I6782 Cerebral ischemia: Secondary | ICD-10-CM | POA: Diagnosis not present

## 2020-05-04 DIAGNOSIS — L989 Disorder of the skin and subcutaneous tissue, unspecified: Secondary | ICD-10-CM | POA: Diagnosis not present

## 2020-05-04 DIAGNOSIS — Z7902 Long term (current) use of antithrombotics/antiplatelets: Secondary | ICD-10-CM

## 2020-05-04 DIAGNOSIS — T2029XA Burn of second degree of multiple sites of head, face, and neck, initial encounter: Secondary | ICD-10-CM | POA: Diagnosis not present

## 2020-05-04 DIAGNOSIS — Z9889 Other specified postprocedural states: Secondary | ICD-10-CM | POA: Diagnosis not present

## 2020-05-04 LAB — CBC WITH DIFFERENTIAL/PLATELET
Abs Immature Granulocytes: 0.2 10*3/uL — ABNORMAL HIGH (ref 0.00–0.07)
Basophils Absolute: 0 10*3/uL (ref 0.0–0.1)
Basophils Relative: 0 %
Eosinophils Absolute: 0 10*3/uL (ref 0.0–0.5)
Eosinophils Relative: 0 %
HCT: 34.8 % — ABNORMAL LOW (ref 36.0–46.0)
Hemoglobin: 10.8 g/dL — ABNORMAL LOW (ref 12.0–15.0)
Lymphocytes Relative: 81 %
Lymphs Abs: 8.9 10*3/uL — ABNORMAL HIGH (ref 0.7–4.0)
MCH: 28.2 pg (ref 26.0–34.0)
MCHC: 31 g/dL (ref 30.0–36.0)
MCV: 90.9 fL (ref 80.0–100.0)
Monocytes Absolute: 0.2 10*3/uL (ref 0.1–1.0)
Monocytes Relative: 2 %
Myelocytes: 2 %
Neutro Abs: 1.7 10*3/uL (ref 1.7–7.7)
Neutrophils Relative %: 15 %
Platelets: 176 10*3/uL (ref 150–400)
RBC: 3.83 MIL/uL — ABNORMAL LOW (ref 3.87–5.11)
RDW: 16.3 % — ABNORMAL HIGH (ref 11.5–15.5)
WBC: 11 10*3/uL — ABNORMAL HIGH (ref 4.0–10.5)
nRBC: 0.2 % (ref 0.0–0.2)

## 2020-05-04 LAB — URINALYSIS, ROUTINE W REFLEX MICROSCOPIC
Bilirubin Urine: NEGATIVE
Glucose, UA: >=500 mg/dL — AB
Ketones, ur: NEGATIVE mg/dL
Leukocytes,Ua: NEGATIVE
Nitrite: NEGATIVE
Protein, ur: 100 mg/dL — AB
Specific Gravity, Urine: 1.009 (ref 1.005–1.030)
pH: 5 (ref 5.0–8.0)

## 2020-05-04 LAB — MAGNESIUM: Magnesium: 2.4 mg/dL (ref 1.7–2.4)

## 2020-05-04 LAB — I-STAT ARTERIAL BLOOD GAS, ED
Acid-base deficit: 14 mmol/L — ABNORMAL HIGH (ref 0.0–2.0)
Bicarbonate: 15.1 mmol/L — ABNORMAL LOW (ref 20.0–28.0)
Calcium, Ion: 1.19 mmol/L (ref 1.15–1.40)
HCT: 35 % — ABNORMAL LOW (ref 36.0–46.0)
Hemoglobin: 11.9 g/dL — ABNORMAL LOW (ref 12.0–15.0)
O2 Saturation: 100 %
Patient temperature: 94.8
Potassium: 3.1 mmol/L — ABNORMAL LOW (ref 3.5–5.1)
Sodium: 145 mmol/L (ref 135–145)
TCO2: 17 mmol/L — ABNORMAL LOW (ref 22–32)
pCO2 arterial: 44.5 mmHg (ref 32.0–48.0)
pH, Arterial: 7.126 — CL (ref 7.350–7.450)
pO2, Arterial: 359 mmHg — ABNORMAL HIGH (ref 83.0–108.0)

## 2020-05-04 LAB — COMPREHENSIVE METABOLIC PANEL
ALT: 253 U/L — ABNORMAL HIGH (ref 0–44)
AST: 324 U/L — ABNORMAL HIGH (ref 15–41)
Albumin: 2.2 g/dL — ABNORMAL LOW (ref 3.5–5.0)
Alkaline Phosphatase: 80 U/L (ref 38–126)
Anion gap: 16 — ABNORMAL HIGH (ref 5–15)
BUN: 24 mg/dL — ABNORMAL HIGH (ref 8–23)
CO2: 15 mmol/L — ABNORMAL LOW (ref 22–32)
Calcium: 8.1 mg/dL — ABNORMAL LOW (ref 8.9–10.3)
Chloride: 113 mmol/L — ABNORMAL HIGH (ref 98–111)
Creatinine, Ser: 1.88 mg/dL — ABNORMAL HIGH (ref 0.44–1.00)
GFR calc Af Amer: 30 mL/min — ABNORMAL LOW (ref >60)
GFR calc non Af Amer: 26 mL/min — ABNORMAL LOW (ref >60)
Glucose, Bld: 294 mg/dL — ABNORMAL HIGH (ref 70–99)
Potassium: 3.7 mmol/L (ref 3.5–5.1)
Sodium: 144 mmol/L (ref 135–145)
Total Bilirubin: 0.6 mg/dL (ref 0.3–1.2)
Total Protein: 4.4 g/dL — ABNORMAL LOW (ref 6.5–8.1)

## 2020-05-04 LAB — BASIC METABOLIC PANEL
Anion gap: 11 (ref 5–15)
BUN: 30 mg/dL — ABNORMAL HIGH (ref 8–23)
CO2: 14 mmol/L — ABNORMAL LOW (ref 22–32)
Calcium: 7.7 mg/dL — ABNORMAL LOW (ref 8.9–10.3)
Chloride: 117 mmol/L — ABNORMAL HIGH (ref 98–111)
Creatinine, Ser: 1.92 mg/dL — ABNORMAL HIGH (ref 0.44–1.00)
GFR calc Af Amer: 30 mL/min — ABNORMAL LOW (ref >60)
GFR calc non Af Amer: 26 mL/min — ABNORMAL LOW (ref >60)
Glucose, Bld: 233 mg/dL — ABNORMAL HIGH (ref 70–99)
Potassium: 4.1 mmol/L (ref 3.5–5.1)
Sodium: 142 mmol/L (ref 135–145)

## 2020-05-04 LAB — COOXEMETRY PANEL
Carboxyhemoglobin: 22.9 % (ref 0.5–1.5)
Carboxyhemoglobin: 5.9 % (ref 0.5–1.5)
Methemoglobin: 1.8 % — ABNORMAL HIGH (ref 0.0–1.5)
Methemoglobin: 1.4 % (ref 0.0–1.5)
O2 Saturation: 98.6 %
O2 Saturation: 99.5 %
Total hemoglobin: 11.2 g/dL — ABNORMAL LOW (ref 12.0–16.0)
Total hemoglobin: 12.5 g/dL (ref 12.0–16.0)

## 2020-05-04 LAB — POCT I-STAT 7, (LYTES, BLD GAS, ICA,H+H)
Acid-base deficit: 12 mmol/L — ABNORMAL HIGH (ref 0.0–2.0)
Bicarbonate: 14 mmol/L — ABNORMAL LOW (ref 20.0–28.0)
Calcium, Ion: 1.05 mmol/L — ABNORMAL LOW (ref 1.15–1.40)
HCT: 39 % (ref 36.0–46.0)
Hemoglobin: 13.3 g/dL (ref 12.0–15.0)
O2 Saturation: 100 %
Patient temperature: 32.9
Potassium: 3.3 mmol/L — ABNORMAL LOW (ref 3.5–5.1)
Sodium: 148 mmol/L — ABNORMAL HIGH (ref 135–145)
TCO2: 15 mmol/L — ABNORMAL LOW (ref 22–32)
pCO2 arterial: 25.5 mmHg — ABNORMAL LOW (ref 32.0–48.0)
pH, Arterial: 7.326 — ABNORMAL LOW (ref 7.350–7.450)
pO2, Arterial: 461 mmHg — ABNORMAL HIGH (ref 83.0–108.0)

## 2020-05-04 LAB — TYPE AND SCREEN
ABO/RH(D): B POS
Antibody Screen: NEGATIVE

## 2020-05-04 LAB — ABO/RH: ABO/RH(D): B POS

## 2020-05-04 LAB — TROPONIN I (HIGH SENSITIVITY)
Troponin I (High Sensitivity): 41 ng/L — ABNORMAL HIGH (ref <18)
Troponin I (High Sensitivity): 818 ng/L (ref <18)

## 2020-05-04 LAB — PROTIME-INR
INR: 1.2 (ref 0.8–1.2)
Prothrombin Time: 14.9 seconds (ref 11.4–15.2)

## 2020-05-04 LAB — TRIGLYCERIDES: Triglycerides: 57 mg/dL (ref <150)

## 2020-05-04 LAB — GLUCOSE, CAPILLARY
Glucose-Capillary: 182 mg/dL — ABNORMAL HIGH (ref 70–99)
Glucose-Capillary: 204 mg/dL — ABNORMAL HIGH (ref 70–99)

## 2020-05-04 LAB — SARS CORONAVIRUS 2 BY RT PCR (HOSPITAL ORDER, PERFORMED IN ~~LOC~~ HOSPITAL LAB): SARS Coronavirus 2: NEGATIVE

## 2020-05-04 LAB — LACTIC ACID, PLASMA: Lactic Acid, Venous: 3.8 mmol/L (ref 0.5–1.9)

## 2020-05-04 LAB — HEMOGLOBIN A1C
Hgb A1c MFr Bld: 7.9 % — ABNORMAL HIGH (ref 4.8–5.6)
Mean Plasma Glucose: 180.03 mg/dL

## 2020-05-04 LAB — APTT: aPTT: 45 seconds — ABNORMAL HIGH (ref 24–36)

## 2020-05-04 LAB — MRSA PCR SCREENING

## 2020-05-04 MED ORDER — SODIUM CHLORIDE 0.9 % IV SOLN: Freq: Once | INTRAVENOUS | Status: AC

## 2020-05-04 MED ORDER — VITAL HIGH PROTEIN PO LIQD
1000.0000 mL | ORAL | Status: DC
  Administered 2020-05-04: 1000 mL
  Filled 2020-05-04: qty 1000

## 2020-05-04 MED ORDER — BACITRACIN 500 UNIT/GM EX OINT
1.0000 "application " | TOPICAL_OINTMENT | Freq: Every day | CUTANEOUS | Status: DC
  Filled 2020-05-04 (×3): qty 0.9

## 2020-05-04 MED ORDER — ORAL CARE MOUTH RINSE
15.0000 mL | OROMUCOSAL | Status: DC
  Administered 2020-05-04 – 2020-05-05 (×8): 15 mL via OROMUCOSAL

## 2020-05-04 MED ORDER — FENTANYL BOLUS VIA INFUSION
25.0000 ug | INTRAVENOUS | Status: DC | PRN
  Filled 2020-05-04: qty 25

## 2020-05-04 MED ORDER — SODIUM CHLORIDE 0.9 % IV BOLUS
2000.0000 mL | Freq: Once | INTRAVENOUS | Status: AC
  Administered 2020-05-04: 2000 mL via INTRAVENOUS

## 2020-05-04 MED ORDER — FENTANYL 2500MCG IN NS 250ML (10MCG/ML) PREMIX INFUSION: 0.0000 ug/h | INTRAVENOUS | Status: DC

## 2020-05-04 MED ORDER — INSULIN ASPART 100 UNIT/ML ~~LOC~~ SOLN
0.0000 [IU] | SUBCUTANEOUS | Status: DC
  Administered 2020-05-04: 3 [IU] via SUBCUTANEOUS
  Administered 2020-05-04: 5 [IU] via SUBCUTANEOUS
  Administered 2020-05-05 (×2): 2 [IU] via SUBCUTANEOUS
  Administered 2020-05-05: 3 [IU] via SUBCUTANEOUS
  Administered 2020-05-05: 5 [IU] via SUBCUTANEOUS

## 2020-05-04 MED ORDER — DOCUSATE SODIUM 100 MG PO CAPS: 100.0000 mg | ORAL_CAPSULE | Freq: Two times a day (BID) | ORAL | Status: DC | PRN

## 2020-05-04 MED ORDER — SODIUM CHLORIDE 0.9 % IV SOLN: INTRAVENOUS | Status: DC

## 2020-05-04 MED ORDER — PROPOFOL 1000 MG/100ML IV EMUL
5.0000 ug/kg/min | INTRAVENOUS | Status: DC
  Administered 2020-05-04 – 2020-05-05 (×2): 25 ug/kg/min via INTRAVENOUS
  Filled 2020-05-04 (×3): qty 100

## 2020-05-04 MED ORDER — FENTANYL CITRATE (PF) 100 MCG/2ML IJ SOLN: 25.0000 ug | Freq: Once | INTRAMUSCULAR | Status: DC

## 2020-05-04 MED ORDER — CHLORHEXIDINE GLUCONATE CLOTH 2 % EX PADS
6.0000 | MEDICATED_PAD | Freq: Every day | CUTANEOUS | Status: DC
  Administered 2020-05-04: 6 via TOPICAL

## 2020-05-04 MED ORDER — BACITRACIN 500 UNIT/GM EX OINT
1.0000 "application " | TOPICAL_OINTMENT | Freq: Every day | CUTANEOUS | Status: DC
  Administered 2020-05-04 – 2020-05-05 (×2): 1 via TOPICAL
  Filled 2020-05-04 (×2): qty 14

## 2020-05-04 MED ORDER — BACITRACIN 500 UNIT/GM EX OINT
1.0000 "application " | TOPICAL_OINTMENT | Freq: Every day | CUTANEOUS | Status: DC
  Filled 2020-05-04: qty 0.9

## 2020-05-04 MED ORDER — FENTANYL 2500MCG IN NS 250ML (10MCG/ML) PREMIX INFUSION
25.0000 ug/h | INTRAVENOUS | Status: DC
  Filled 2020-05-04 (×2): qty 250

## 2020-05-04 MED ORDER — POLYETHYLENE GLYCOL 3350 17 G PO PACK: 17.0000 g | PACK | Freq: Every day | ORAL | Status: DC | PRN

## 2020-05-04 MED ORDER — HEPARIN SODIUM (PORCINE) 5000 UNIT/ML IJ SOLN
5000.0000 [IU] | Freq: Three times a day (TID) | INTRAMUSCULAR | Status: DC
  Administered 2020-05-04 – 2020-05-05 (×2): 5000 [IU] via SUBCUTANEOUS
  Filled 2020-05-04 (×2): qty 1

## 2020-05-04 MED ORDER — CHLORHEXIDINE GLUCONATE 0.12% ORAL RINSE (MEDLINE KIT)
15.0000 mL | Freq: Two times a day (BID) | OROMUCOSAL | Status: DC
  Administered 2020-05-04 – 2020-05-05 (×3): 15 mL via OROMUCOSAL

## 2020-05-04 MED ORDER — STERILE WATER FOR INJECTION IV SOLN
125.0000 mL/h | INTRAVENOUS | Status: DC
  Administered 2020-05-04 – 2020-05-05 (×3): 125 mL/h via INTRAVENOUS
  Filled 2020-05-04 (×3): qty 850

## 2020-05-04 MED ORDER — BACITRACIN ZINC 500 UNIT/GM EX OINT
TOPICAL_OINTMENT | Freq: Every day | CUTANEOUS | Status: DC
  Filled 2020-05-04: qty 28.4

## 2020-05-04 MED ORDER — BACITRACIN ZINC 500 UNIT/GM EX OINT
1.0000 "application " | TOPICAL_OINTMENT | Freq: Every day | CUTANEOUS | Status: DC
  Filled 2020-05-04: qty 28.4

## 2020-05-04 MED ORDER — EPINEPHRINE HCL 5 MG/250ML IV SOLN IN NS
0.5000 ug/min | INTRAVENOUS | Status: DC
  Administered 2020-05-04: 1 ug/min via INTRAVENOUS

## 2020-05-04 MED ORDER — PANTOPRAZOLE SODIUM 40 MG IV SOLR
40.0000 mg | Freq: Every day | INTRAVENOUS | Status: DC
  Administered 2020-05-04: 40 mg via INTRAVENOUS
  Filled 2020-05-04: qty 40

## 2020-05-04 MED ORDER — EPINEPHRINE 1 MG/10ML IJ SOSY
PREFILLED_SYRINGE | INTRAMUSCULAR | Status: AC | PRN
  Administered 2020-05-04 (×2): 0.1 mg via INTRAVENOUS

## 2020-05-04 MED ORDER — NOREPINEPHRINE 4 MG/250ML-% IV SOLN
0.0000 ug/min | INTRAVENOUS | Status: DC
  Administered 2020-05-04 – 2020-05-05 (×2): 10 ug/min via INTRAVENOUS
  Filled 2020-05-04 (×4): qty 250

## 2020-05-04 NOTE — ED Notes (Signed)
Patient transported to CT 

## 2020-05-04 NOTE — Progress Notes (Signed)
Pt referred to [facility]S per request from bedside RN.  Referral # [id number]. Info in doc flowsheet as well. Coordinator to call bedside RN.
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
## 2020-05-04 NOTE — ED Notes (Signed)
Attempt report x1  
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
## 2020-05-04 NOTE — Telephone Encounter (Signed)
Reviewed history due to duplicate charts.

## 2020-05-04 NOTE — ED Notes (Signed)
Date and time results received: 05/27/2020 1005 (use smartphrase ".now" to insert current time)  Test: Coox carboxy  Critical Value: 22.9  Name of Provider Notified: Dr. Roslynn Amble   Orders Received? Or Actions Taken?: yes, not at this time
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
## 2020-05-04 NOTE — Progress Notes (Signed)
Stat  EEG complete - results pending.  
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
## 2020-05-05 ENCOUNTER — Inpatient Hospital Stay (HOSPITAL_COMMUNITY): Payer: Medicare HMO

## 2020-05-05 DIAGNOSIS — J3489 Other specified disorders of nose and nasal sinuses: Secondary | ICD-10-CM | POA: Diagnosis not present

## 2020-05-05 DIAGNOSIS — J9811 Atelectasis: Secondary | ICD-10-CM | POA: Diagnosis not present

## 2020-05-05 DIAGNOSIS — G936 Cerebral edema: Secondary | ICD-10-CM | POA: Diagnosis not present

## 2020-05-05 DIAGNOSIS — R220 Localized swelling, mass and lump, head: Secondary | ICD-10-CM | POA: Diagnosis not present

## 2020-05-05 DIAGNOSIS — Z8674 Personal history of sudden cardiac arrest: Secondary | ICD-10-CM | POA: Diagnosis not present

## 2020-05-05 LAB — POCT I-STAT 7, (LYTES, BLD GAS, ICA,H+H)
Acid-Base Excess: 3 mmol/L — ABNORMAL HIGH (ref 0.0–2.0)
Acid-Base Excess: 6 mmol/L — ABNORMAL HIGH (ref 0.0–2.0)
Acid-Base Excess: 3 mmol/L — ABNORMAL HIGH (ref 0.0–2.0)
Bicarbonate: 26.3 mmol/L (ref 20.0–28.0)
Bicarbonate: 28.6 mmol/L — ABNORMAL HIGH (ref 20.0–28.0)
Bicarbonate: 31.5 mmol/L — ABNORMAL HIGH (ref 20.0–28.0)
Calcium, Ion: 0.99 mmol/L — ABNORMAL LOW (ref 1.15–1.40)
Calcium, Ion: 1 mmol/L — ABNORMAL LOW (ref 1.15–1.40)
Calcium, Ion: 1.05 mmol/L — ABNORMAL LOW (ref 1.15–1.40)
HCT: 40 % (ref 36.0–46.0)
HCT: 38 % (ref 36.0–46.0)
HCT: 40 % (ref 36.0–46.0)
Hemoglobin: 13.6 g/dL (ref 12.0–15.0)
Hemoglobin: 12.9 g/dL (ref 12.0–15.0)
Hemoglobin: 13.6 g/dL (ref 12.0–15.0)
O2 Saturation: 100 %
O2 Saturation: 100 %
O2 Saturation: 100 %
Patient temperature: 37.2
Patient temperature: 36
Patient temperature: 36.1
Potassium: 3.6 mmol/L (ref 3.5–5.1)
Potassium: 3.8 mmol/L (ref 3.5–5.1)
Potassium: 4.3 mmol/L (ref 3.5–5.1)
Sodium: 146 mmol/L — ABNORMAL HIGH (ref 135–145)
Sodium: 146 mmol/L — ABNORMAL HIGH (ref 135–145)
Sodium: 146 mmol/L — ABNORMAL HIGH (ref 135–145)
TCO2: 27 mmol/L (ref 22–32)
TCO2: 30 mmol/L (ref 22–32)
TCO2: 33 mmol/L — ABNORMAL HIGH (ref 22–32)
pCO2 arterial: 35.5 mmHg (ref 32.0–48.0)
pCO2 arterial: 32.2 mmHg (ref 32.0–48.0)
pCO2 arterial: 61.5 mmHg — ABNORMAL HIGH (ref 32.0–48.0)
pH, Arterial: 7.478 — ABNORMAL HIGH (ref 7.350–7.450)
pH, Arterial: 7.553 — ABNORMAL HIGH (ref 7.350–7.450)
pH, Arterial: 7.312 — ABNORMAL LOW (ref 7.350–7.450)
pO2, Arterial: 525 mmHg — ABNORMAL HIGH (ref 83.0–108.0)
pO2, Arterial: 336 mmHg — ABNORMAL HIGH (ref 83.0–108.0)
pO2, Arterial: 567 mmHg — ABNORMAL HIGH (ref 83.0–108.0)

## 2020-05-05 LAB — CBC
HCT: 37.9 % (ref 36.0–46.0)
Hemoglobin: 12.7 g/dL (ref 12.0–15.0)
MCH: 27 pg (ref 26.0–34.0)
MCHC: 33.5 g/dL (ref 30.0–36.0)
MCV: 80.5 fL (ref 80.0–100.0)
Platelets: 203 10*3/uL (ref 150–400)
RBC: 4.71 MIL/uL (ref 3.87–5.11)
RDW: 15.3 % (ref 11.5–15.5)
WBC: 15.6 10*3/uL — ABNORMAL HIGH (ref 4.0–10.5)
nRBC: 0 % (ref 0.0–0.2)

## 2020-05-05 LAB — PHOSPHORUS: Phosphorus: 2.8 mg/dL (ref 2.5–4.6)

## 2020-05-05 LAB — GLUCOSE, CAPILLARY
Glucose-Capillary: 140 mg/dL — ABNORMAL HIGH (ref 70–99)
Glucose-Capillary: 144 mg/dL — ABNORMAL HIGH (ref 70–99)
Glucose-Capillary: 171 mg/dL — ABNORMAL HIGH (ref 70–99)
Glucose-Capillary: 202 mg/dL — ABNORMAL HIGH (ref 70–99)

## 2020-05-05 LAB — BASIC METABOLIC PANEL
Anion gap: 10 (ref 5–15)
BUN: 35 mg/dL — ABNORMAL HIGH (ref 8–23)
CO2: 22 mmol/L (ref 22–32)
Calcium: 7.3 mg/dL — ABNORMAL LOW (ref 8.9–10.3)
Chloride: 113 mmol/L — ABNORMAL HIGH (ref 98–111)
Creatinine, Ser: 2.3 mg/dL — ABNORMAL HIGH (ref 0.44–1.00)
GFR calc Af Amer: 24 mL/min — ABNORMAL LOW (ref >60)
GFR calc non Af Amer: 21 mL/min — ABNORMAL LOW (ref >60)
Glucose, Bld: 152 mg/dL — ABNORMAL HIGH (ref 70–99)
Potassium: 3.7 mmol/L (ref 3.5–5.1)
Sodium: 145 mmol/L (ref 135–145)

## 2020-05-05 LAB — HEPATIC FUNCTION PANEL
ALT: 266 U/L — ABNORMAL HIGH (ref 0–44)
AST: 364 U/L — ABNORMAL HIGH (ref 15–41)
Albumin: 2.3 g/dL — ABNORMAL LOW (ref 3.5–5.0)
Alkaline Phosphatase: 80 U/L (ref 38–126)
Bilirubin, Direct: 0.1 mg/dL (ref 0.0–0.2)
Indirect Bilirubin: 0.5 mg/dL (ref 0.3–0.9)
Total Bilirubin: 0.6 mg/dL (ref 0.3–1.2)
Total Protein: 4.7 g/dL — ABNORMAL LOW (ref 6.5–8.1)

## 2020-05-05 LAB — MAGNESIUM: Magnesium: 1.5 mg/dL — ABNORMAL LOW (ref 1.7–2.4)

## 2020-05-05 LAB — MRSA PCR SCREENING: MRSA by PCR: NEGATIVE

## 2020-05-05 MED ORDER — SODIUM CHLORIDE 0.9% FLUSH: 10.0000 mL | INTRAVENOUS | Status: DC | PRN

## 2020-05-05 MED ORDER — LACTATED RINGERS IV SOLN: INTRAVENOUS | Status: DC

## 2020-05-05 MED ORDER — POTASSIUM CHLORIDE 20 MEQ/15ML (10%) PO SOLN
40.0000 meq | Freq: Once | ORAL | Status: AC
  Administered 2020-05-05: 40 meq via ORAL
  Filled 2020-05-05: qty 30

## 2020-05-05 MED ORDER — SODIUM CHLORIDE 0.9% FLUSH
10.0000 mL | Freq: Two times a day (BID) | INTRAVENOUS | Status: DC
  Administered 2020-05-05: 10 mL

## 2020-05-05 MED ORDER — MAGNESIUM SULFATE 2 GM/50ML IV SOLN
2.0000 g | Freq: Once | INTRAVENOUS | Status: AC
  Administered 2020-05-05: 2 g via INTRAVENOUS
  Filled 2020-05-05: qty 50

## 2020-05-05 MED FILL — Medication: Qty: 1 | Status: AC

## 2020-05-06 LAB — PATHOLOGIST SMEAR REVIEW

## 2020-05-21 ENCOUNTER — Ambulatory Visit: Payer: Medicare HMO | Admitting: Family Medicine

## 2020-05-29 NOTE — Progress Notes (Signed)
Family informed of Ms. Michelini' death.  They will say their goodbyes and support turned off at [facility]  All questions answered.  Erskine Emery MD PCCM

## 2020-05-29 NOTE — Procedures (Signed)
Adult Brain Death Determination  Time of Examination: June 01, 2020 3:01 PM  A. No Evidence of /Cause of Reversible CNS Depression  Core temperature must be greater >36 degrees. Last temp: Temp: (!) 97.2 F (36.2 C) (Note: If unable to achieve normothermia after 12 hours of temperature management, may consider proceeding with Brain Death Evaluation.):    yes  1. Evidence of severe metabolic perturbations that could potentate CNS depression. Consider glucose, Na, creatinine, PaCO2, SaO2.:    Absent  2. Evidence of drugs, by history or measurement, that could potentiate central nervous system depression: narcotics, ethanol, benzodiazepines, barbiturates, neuromuscular blockade.:     Absent  B. Absence of Cortical Function  1. GCS = 3:    yes  C. Absence of Brain Stem Reflexes and Responses  1. Pupils light-fixed    yes  2. Corneal reflexes:    Absent  3. Response to upper and lower airway stimulation, such as pharyngeal and endotracheal suctioning.:    Absent  4. Ocular response to head turning (eye movement).    Absent  D. Absence of Spontaneous Respirations  (Apnea test performed per Brain Death Policy. If not met due to hemodynamic/ventilatory instability, then perform EEG, TCD, or cerebral blow flow studies.)  1.   Spontaneous Respirations   Absent  2.   PaCO2 at start of apnea test:  32  3.   PaCO2 at end of apnea test:  61  4.   CO2 rise of 20 or greater from baseline:   yes  E. Document Confirmatory Test Utilized: (Optional) Nuclear cerebral flow, cerebral angiography (CT/MR angio), transcranial Doppler ultrasound, EEG, SSEP (record results).  1. Test results (if available):  CT Head  Patient pronounced dead by neurological criteria at 3:00 PM on June 01, 2020.  Candee Furbish, MD 2020-06-01 3:01 PM

## 2020-05-29 NOTE — Progress Notes (Signed)
Assisted with transport to CT.

## 2020-05-29 NOTE — Progress Notes (Signed)
Apnea test done with Dr Tamala Julian in room. Pre and post ABG obtained.
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
## 2020-05-29 NOTE — Progress Notes (Addendum)
When I arrived pt's granddaughter, niece and nephew were bedside. Pt's granddaughter was appropriately tearful and talked about how her grandmother inspired her. Gdaughter looked up to her grandmother and because of her inspiration she is back in school. She became very emotional when her grandmother was pronounced by staff. She said her grandmother raised her from the time she was a few months old and was to her, her mother. She told me pt's brother was next door. Pt's son was on facetime when I arrived and I was able to talk with him and encourage him before pt passed. I remained with family until most had left the unit.  [place], MDiv

## 2020-05-29 NOTE — Progress Notes (Signed)
°   23-May-2020 1800  Clinical Encounter Type  Visited With Patient;Family  Visit Type Spiritual support;Death  Referral From Nurse  Consult/Referral To Chaplain  Spiritual Encounters  Spiritual Needs Prayer;Emotional;Grief support  Chaplain received a call for prayer. End of Life.
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
## 2020-05-29 NOTE — Progress Notes (Signed)
CDS called. Pt suitable for tissue donatioiin

## 2020-05-29 NOTE — Progress Notes (Signed)
Brain death testing conducted and patient declared brain dead at 1500 by CCM MD Ina Homes. Patient had many family members present for visitation and were notified by Dr Tamala Julian of her death. Patient's son Wanita Chamberlain, next of kin, was present via phone for this notification. Per Dr Tamala Julian, family was allowed to visit for several hours with plan to turn off life support (ventilator and levophed gtt) when family was ready.   Family communicated to this RN at [place] that they were ready to withdraw life support measures. Patient had family at bedside at time ventilator and levophed gtt were turned off, and patient's son Hart Carwin was present via phone. Patient's cardiac time of death was 6, verified by Scheryl Darter RN and Terie Purser RN. Family present at bedside at time of cardiac death.   Funeral home information obtained by Jimmye Norman, patient's nephew, who is handling funeral arrangements. Medical examiner was notified of time of brain death and will be called when patient is ready for transport to morgue. Since medical examiner will be seeing patient, all medical equipment will be left in place (ETT, central line, arterial line, PIVs, OGT, foley, etc.). CDS notified of cardiac time of death by E-link RN. 80 mL fentanyl wasted with Ruby Cola RN from discontinued fentanyl gtt - wasted in [place] med room stericycle. When family has finished visiting, patient will be transported to [place] and medical examiner will be notified.  [employer] RN

## 2020-05-29 DEATH — deceased

## 2020-06-14 ENCOUNTER — Other Ambulatory Visit: Payer: Self-pay | Admitting: Internal Medicine

## 2020-08-17 ENCOUNTER — Telehealth: Payer: Self-pay | Admitting: Internal Medicine

## 2020-08-18 NOTE — Telephone Encounter (Signed)
disregard

## 2020-08-20 ENCOUNTER — Telehealth: Payer: Self-pay | Admitting: Internal Medicine

## 2020-08-20 NOTE — Telephone Encounter (Signed)
Patient son came to the office dropped of physician statement to be signed by Dr. Tamala Julian. Dr. Tamala Julian will be back in the hospital on Monday 08/24/2020. Given to Lauren to have Jackson Latino take to him then. Lauren will contact pt son Jimmye Norman when this is complete - he can be reached at [phone]. Pr

## 2020-09-07 NOTE — Telephone Encounter (Signed)
Dr. Vaughan Browner completed paperwork on behalf of Dr. Tamala Julian in his absence. Called and spoke to [employer] - advised paperwork is ready to pick up -pr
